# Patient Record
Sex: Female | Born: 1955 | Race: White | Hispanic: No | Marital: Married | Smoking: Never smoker
Health system: Southern US, Community
[De-identification: ages and names within clinical notes are randomized; demographics above are authoritative.]

## PROBLEM LIST (undated history)

## (undated) DIAGNOSIS — Z8679 Personal history of other diseases of the circulatory system: Secondary | ICD-10-CM

## (undated) DIAGNOSIS — F419 Anxiety disorder, unspecified: Secondary | ICD-10-CM

## (undated) DIAGNOSIS — J45909 Unspecified asthma, uncomplicated: Secondary | ICD-10-CM

## (undated) DIAGNOSIS — I10 Essential (primary) hypertension: Secondary | ICD-10-CM

## (undated) DIAGNOSIS — D62 Acute posthemorrhagic anemia: Secondary | ICD-10-CM

## (undated) DIAGNOSIS — E785 Hyperlipidemia, unspecified: Secondary | ICD-10-CM

## (undated) DIAGNOSIS — E079 Disorder of thyroid, unspecified: Secondary | ICD-10-CM

## (undated) DIAGNOSIS — M722 Plantar fascial fibromatosis: Secondary | ICD-10-CM

## (undated) DIAGNOSIS — K5903 Drug induced constipation: Secondary | ICD-10-CM

## (undated) DIAGNOSIS — K219 Gastro-esophageal reflux disease without esophagitis: Secondary | ICD-10-CM

## (undated) DIAGNOSIS — G8918 Other acute postprocedural pain: Secondary | ICD-10-CM

## (undated) HISTORY — PX: NOSE SURGERY: SHX723

## (undated) HISTORY — DX: Unspecified asthma, uncomplicated: J45.909

## (undated) HISTORY — DX: Hyperlipidemia, unspecified: E78.5

## (undated) HISTORY — DX: Acute posthemorrhagic anemia: D62

## (undated) HISTORY — DX: Other acute postprocedural pain: G89.18

## (undated) HISTORY — DX: Disorder of thyroid, unspecified: E07.9

## (undated) HISTORY — DX: Anxiety disorder, unspecified: F41.9

## (undated) HISTORY — DX: Essential (primary) hypertension: I10

## (undated) HISTORY — PX: BREAST SURGERY: SHX581

## (undated) HISTORY — PX: ABDOMINAL HYSTERECTOMY: SHX81

## (undated) HISTORY — DX: Plantar fascial fibromatosis: M72.2

## (undated) HISTORY — DX: Personal history of other diseases of the circulatory system: Z86.79

## (undated) HISTORY — DX: Drug induced constipation: K59.03

---

## 2015-02-08 DIAGNOSIS — R635 Abnormal weight gain: Secondary | ICD-10-CM

## 2015-02-08 DIAGNOSIS — F4322 Adjustment disorder with anxiety: Secondary | ICD-10-CM | POA: Insufficient documentation

## 2015-02-08 HISTORY — DX: Abnormal weight gain: R63.5

## 2015-02-08 HISTORY — DX: Adjustment disorder with anxiety: F43.22

## 2015-03-15 ENCOUNTER — Other Ambulatory Visit: Payer: Self-pay | Admitting: Obstetrics and Gynecology

## 2015-05-24 ENCOUNTER — Ambulatory Visit: Payer: Self-pay | Admitting: Internal Medicine

## 2015-05-24 ENCOUNTER — Telehealth: Payer: Self-pay | Admitting: General Practice

## 2015-05-24 NOTE — Telephone Encounter (Signed)
Please advise if this refill will be appropriate.     KP

## 2015-05-24 NOTE — Telephone Encounter (Signed)
Relation to QM:VHQI Call back number: [phone] Pharmacy: [facility] [address] [place], [place] [address] [phone]    Reason for call:  Patient has a new patient appointment for 07/03/15 and she is completely out of cholesterol medication. Patient is requesting LIVALO  1x daily. Patient is transferring from Dr. Luretha Rued  [address], [place], [place] [address]  Phone: [phone]

## 2015-05-25 ENCOUNTER — Telehealth: Payer: Self-pay

## 2015-05-25 NOTE — Telephone Encounter (Signed)
Called patient advised she will not be able to have refill on prescription until seen by provider. Patient became upset. Request to speak with Dr. Laury Axon or Supervisor. Message forwarded to both.

## 2015-05-25 NOTE — Telephone Encounter (Signed)
Spoke with patient and put her in to establish care with Dr Laury Axon 9.30.2016 at 3pm

## 2015-05-25 NOTE — Telephone Encounter (Signed)
Ive never seen her.  We can not rx meds when we have not seen the patient.    [place] will not be available until tomorrow most likely

## 2015-05-25 NOTE — Telephone Encounter (Signed)
Handled under separate encounter.  

## 2015-05-25 NOTE — Telephone Encounter (Signed)
We can not until we see her---- her previous pcp should be able to do it for her

## 2015-05-26 ENCOUNTER — Encounter: Payer: Self-pay | Admitting: Family Medicine

## 2015-05-26 ENCOUNTER — Ambulatory Visit (INDEPENDENT_AMBULATORY_CARE_PROVIDER_SITE_OTHER): Payer: Managed Care, Other (non HMO) | Admitting: Family Medicine

## 2015-05-26 VITALS — BP 112/70 | HR 76 | Temp 98.9°F | Resp 18 | Ht 67.5 in | Wt 178.2 lb

## 2015-05-26 DIAGNOSIS — E039 Hypothyroidism, unspecified: Secondary | ICD-10-CM | POA: Diagnosis not present

## 2015-05-26 DIAGNOSIS — F411 Generalized anxiety disorder: Secondary | ICD-10-CM | POA: Diagnosis not present

## 2015-05-26 DIAGNOSIS — I10 Essential (primary) hypertension: Secondary | ICD-10-CM

## 2015-05-26 DIAGNOSIS — R300 Dysuria: Secondary | ICD-10-CM | POA: Diagnosis not present

## 2015-05-26 DIAGNOSIS — E559 Vitamin D deficiency, unspecified: Secondary | ICD-10-CM

## 2015-05-26 DIAGNOSIS — E785 Hyperlipidemia, unspecified: Secondary | ICD-10-CM

## 2015-05-26 DIAGNOSIS — Z23 Encounter for immunization: Secondary | ICD-10-CM | POA: Diagnosis not present

## 2015-05-26 DIAGNOSIS — Z1159 Encounter for screening for other viral diseases: Secondary | ICD-10-CM

## 2015-05-26 LAB — POCT URINALYSIS DIPSTICK
Bilirubin, UA: NEGATIVE
Glucose, UA: NEGATIVE
KETONES UA: POSITIVE
Leukocytes, UA: NEGATIVE
Nitrite, UA: NEGATIVE
RBC UA: POSITIVE
UROBILINOGEN UA: 1
pH, UA: 5.5

## 2015-05-26 MED ORDER — ALPRAZOLAM 0.25 MG PO TABS: 0.2500 mg | ORAL_TABLET | Freq: Every evening | ORAL | Status: DC | PRN

## 2015-05-26 MED ORDER — PITAVASTATIN CALCIUM 2 MG PO TABS: ORAL_TABLET | ORAL | Status: DC

## 2015-05-26 NOTE — Progress Notes (Signed)
Pre visit review using our clinic review tool, if applicable. No additional management support is needed unless otherwise documented below in the visit note. 

## 2015-05-26 NOTE — Progress Notes (Signed)
Patient ID: Kathryn Boyer, female   DOB: December 14, 1955, 59 y.o.   MRN: [id number]   Subjective:    Patient ID: Kathryn Boyer, female    DOB: February 06, 1956, 59 y.o.   MRN: [id number]  Chief Complaint  Patient presents with  . Establish Care    Not fasting, needs refills    HPI Patient is in today to establish.  She needs refills of her meds.     Past Medical History  Diagnosis Date  . Thyroid disease   . Hyperlipidemia   . Hypertension   . Anxiety   . Plantar fasciitis     Past Surgical History  Procedure Laterality Date  . Abdominal hysterectomy    . Nose surgery    . Breast surgery      Family History  Problem Relation Age of Onset  . Heart disease Mother   . Stroke Mother   . Hypertension Mother   . Cancer Mother     ovarian  . Aneurysm Father   . Hypertension Father   . Heart disease Father   . Kidney disease Father   . Heart disease Sister     Social History   Social History  . Marital Status: Married    Spouse Name: N/A  . Number of Children: N/A  . Years of Education: N/A   Occupational History  . Not on file.   Social History Main Topics  . Smoking status: Never Smoker   . Smokeless tobacco: Not on file  . Alcohol Use: No  . Drug Use: No  . Sexual Activity: Not on file   Other Topics Concern  . Not on file   Social History Narrative  . No narrative on file    No outpatient prescriptions prior to visit.   No facility-administered medications prior to visit.    Allergies  Allergen Reactions  . Penicillins     ROS     Objective:    Physical Exam  BP 112/70 mmHg  Pulse 76  Temp(Src) 98.9 F (37.2 C) (Oral)  Resp 18  Ht 5' 7.5" (1.715 m)  Wt 178 lb 3.2 oz (80.831 kg)  BMI 27.48 kg/m2  SpO2 98% Wt Readings from Last 3 Encounters:  05/26/15 178 lb 3.2 oz (80.831 kg)     No results found for: WBC, HGB, HCT, PLT, GLUCOSE, CHOL, TRIG, HDL, LDLDIRECT, LDLCALC, ALT, AST, NA, K, CL, CREATININE, BUN, CO2, TSH, PSA, INR, GLUF,  HGBA1C, MICROALBUR  No results found for: TSH No results found for: WBC, HGB, HCT, MCV, PLT No results found for: NA, K, CHLORIDE, CO2, GLUCOSE, BUN, CREATININE, BILITOT, ALKPHOS, AST, ALT, PROT, ALBUMIN, CALCIUM, ANIONGAP, EGFR, GFR No results found for: CHOL No results found for: HDL No results found for: LDLCALC No results found for: TRIG No results found for: CHOLHDL No results found for: HGBA1C     Assessment & Plan:   Problem List Items Addressed This Visit    None    Visit Diagnoses    Hyperlipidemia LDL goal <100    -  Primary    Relevant Medications    losartan (COZAAR) 100 MG tablet    Pitavastatin Calcium (LIVALO) 2 MG TABS    Generalized anxiety disorder        Relevant Medications    ALPRAZolam (XANAX) 0.25 MG tablet       I have changed Kathryn Boyer's ALPRAZolam and Pitavastatin Calcium. I am also having her maintain her levothyroxine, losartan, Vitamin D (Ergocalciferol), and venlafaxine.  Meds ordered this encounter  Medications  . DISCONTD: Pitavastatin Calcium (LIVALO) 2 MG TABS    Sig: Take by mouth.  . levothyroxine (SYNTHROID, LEVOTHROID) 100 MCG tablet    Sig: Take 100 mcg by mouth daily before breakfast.  . losartan (COZAAR) 100 MG tablet    Sig: Take 100 mg by mouth daily.  . Vitamin D, Ergocalciferol, (DRISDOL) 50000 UNITS CAPS capsule    Sig: Take 50,000 Units by mouth every 7 (seven) days.  Marland Kitchen venlafaxine (EFFEXOR) 25 MG tablet    Sig: Take 25 mg by mouth 2 (two) times daily.  Marland Kitchen DISCONTD: ALPRAZolam (XANAX) 0.25 MG tablet    Sig: Take 0.25 mg by mouth at bedtime as needed for anxiety.  . ALPRAZolam (XANAX) 0.25 MG tablet    Sig: Take 1 tablet (0.25 mg total) by mouth at bedtime as needed for anxiety.    Dispense:  30 tablet    Refill:  0  . Pitavastatin Calcium (LIVALO) 2 MG TABS    Sig: 1 po qhs    Dispense:  30 tablet    Refill:  2     Elizabeth Sauer, LPN

## 2015-05-26 NOTE — Patient Instructions (Signed)
Generalized Anxiety Disorder Generalized anxiety disorder (GAD) is a mental disorder. It interferes with life functions, including relationships, work, and school. GAD is different from normal anxiety, which everyone experiences at some point in their lives in response to specific life events and activities. Normal anxiety actually helps us prepare for and get through these life events and activities. Normal anxiety goes away after the event or activity is over.  GAD causes anxiety that is not necessarily related to specific events or activities. It also causes excess anxiety in proportion to specific events or activities. The anxiety associated with GAD is also difficult to control. GAD can vary from mild to severe. People with severe GAD can have intense waves of anxiety with physical symptoms (panic attacks).  SYMPTOMS The anxiety and worry associated with GAD are difficult to control. This anxiety and worry are related to many life events and activities and also occur more days than not for 6 months or longer. People with GAD also have three or more of the following symptoms (one or more in children):  Restlessness.   Fatigue.  Difficulty concentrating.   Irritability.  Muscle tension.  Difficulty sleeping or unsatisfying sleep. DIAGNOSIS GAD is diagnosed through an assessment by your health care provider. Your health care provider will ask you questions aboutyour mood,physical symptoms, and events in your life. Your health care provider may ask you about your medical history and use of alcohol or drugs, including prescription medicines. Your health care provider may also do a physical exam and blood tests. Certain medical conditions and the use of certain substances can cause symptoms similar to those associated with GAD. Your health care provider may refer you to a mental health specialist for further evaluation. TREATMENT The following therapies are usually used to treat GAD:    Medication. Antidepressant medication usually is prescribed for long-term daily control. Antianxiety medicines may be added in severe cases, especially when panic attacks occur.   Talk therapy (psychotherapy). Certain types of talk therapy can be helpful in treating GAD by providing support, education, and guidance. A form of talk therapy called cognitive behavioral therapy can teach you healthy ways to think about and react to daily life events and activities.  Stress managementtechniques. These include yoga, meditation, and exercise and can be very helpful when they are practiced regularly. A mental health specialist can help determine which treatment is best for you. Some people see improvement with one therapy. However, other people require a combination of therapies. Document Released: 12/07/2012 Document Revised: 12/27/2013 Document Reviewed: 12/07/2012 ExitCare Patient Information 2015 [employer]. This information is not intended to replace advice given to you by your health care provider. Make sure you discuss any questions you have with your health care provider.  

## 2015-05-28 LAB — URINE CULTURE

## 2015-05-29 ENCOUNTER — Other Ambulatory Visit (INDEPENDENT_AMBULATORY_CARE_PROVIDER_SITE_OTHER): Payer: Managed Care, Other (non HMO)

## 2015-05-29 DIAGNOSIS — E785 Hyperlipidemia, unspecified: Secondary | ICD-10-CM

## 2015-05-29 DIAGNOSIS — I10 Essential (primary) hypertension: Secondary | ICD-10-CM

## 2015-05-29 DIAGNOSIS — E039 Hypothyroidism, unspecified: Secondary | ICD-10-CM

## 2015-05-29 DIAGNOSIS — Z1159 Encounter for screening for other viral diseases: Secondary | ICD-10-CM

## 2015-05-29 LAB — LIPID PANEL
Cholesterol: 149 mg/dL (ref 0–200)
HDL: 53.1 mg/dL (ref >39.00)
LDL Cholesterol: 77 mg/dL (ref 0–99)
NONHDL: 96.3
Total CHOL/HDL Ratio: 3
Triglycerides: 95 mg/dL (ref 0.0–149.0)
VLDL: 19 mg/dL (ref 0.0–40.0)

## 2015-05-29 LAB — CBC WITH DIFFERENTIAL/PLATELET
BASOS ABS: 0 10*3/uL (ref 0.0–0.1)
Basophils Relative: 0.5 % (ref 0.0–3.0)
EOS ABS: 0.1 10*3/uL (ref 0.0–0.7)
EOS PCT: 2.1 % (ref 0.0–5.0)
HCT: 45.8 % (ref 36.0–46.0)
HEMOGLOBIN: 15.2 g/dL — AB (ref 12.0–15.0)
Lymphocytes Relative: 35.3 % (ref 12.0–46.0)
Lymphs Abs: 2.3 10*3/uL (ref 0.7–4.0)
MCHC: 33.3 g/dL (ref 30.0–36.0)
MCV: 91.1 fl (ref 78.0–100.0)
MONO ABS: 0.4 10*3/uL (ref 0.1–1.0)
Monocytes Relative: 6.2 % (ref 3.0–12.0)
Neutro Abs: 3.6 10*3/uL (ref 1.4–7.7)
Neutrophils Relative %: 55.9 % (ref 43.0–77.0)
Platelets: 280 10*3/uL (ref 150.0–400.0)
RBC: 5.03 Mil/uL (ref 3.87–5.11)
RDW: 13.5 % (ref 11.5–15.5)
WBC: 6.5 10*3/uL (ref 4.0–10.5)

## 2015-05-29 LAB — COMPREHENSIVE METABOLIC PANEL
ALBUMIN: 4.3 g/dL (ref 3.5–5.2)
ALK PHOS: 75 U/L (ref 39–117)
ALT: 21 U/L (ref 0–35)
AST: 23 U/L (ref 0–37)
BUN: 13 mg/dL (ref 6–23)
CALCIUM: 9.3 mg/dL (ref 8.4–10.5)
CO2: 27 mEq/L (ref 19–32)
CREATININE: 0.89 mg/dL (ref 0.40–1.20)
Chloride: 106 mEq/L (ref 96–112)
GFR: 68.74 mL/min (ref >60.00)
Glucose, Bld: 106 mg/dL — ABNORMAL HIGH (ref 70–99)
POTASSIUM: 4.3 meq/L (ref 3.5–5.1)
SODIUM: 143 meq/L (ref 135–145)
TOTAL PROTEIN: 7.1 g/dL (ref 6.0–8.3)
Total Bilirubin: 0.5 mg/dL (ref 0.2–1.2)

## 2015-05-29 LAB — TSH: TSH: 1.61 u[IU]/mL (ref 0.35–4.50)

## 2015-05-29 LAB — VITAMIN D 25 HYDROXY (VIT D DEFICIENCY, FRACTURES): VITD: 44.02 ng/mL (ref 30.00–100.00)

## 2015-05-29 NOTE — Addendum Note (Signed)
Addended by: Verdie Shire on: 05/29/2015 07:06 AM   Modules accepted: Orders

## 2015-05-30 LAB — HEPATITIS C ANTIBODY: HCV Ab: NEGATIVE

## 2015-05-31 ENCOUNTER — Telehealth: Payer: Self-pay | Admitting: Emergency Medicine

## 2015-05-31 LAB — NMR LIPOPROFILE WITHOUT LIPIDS
HDL PARTICLE NUMBER: 35.8 umol/L (ref >=30.5)
HDL SIZE: 8.9 nm — AB (ref >=9.2)
LARGE HDL: 4.9 umol/L (ref >=4.8)
LDL PARTICLE NUMBER: 1223 nmol/L — AB (ref <1000)
LDL SIZE: 21.1 nm (ref >=20.8)
LP-IR SCORE: 56 — AB (ref <=45)
Large VLDL-P: 4 nmol/L — ABNORMAL HIGH (ref <=2.7)
Small LDL Particle Number: 574 nmol/L — ABNORMAL HIGH (ref <=527)
VLDL SIZE: 47.6 nm — AB (ref <=46.6)

## 2015-05-31 NOTE — Telephone Encounter (Signed)
[facility] lab called stating that they are no longer doing NMR lipoprofile. They asked if you wanted to do a lipid panel .

## 2015-05-31 NOTE — Telephone Encounter (Signed)
Please advise      KP 

## 2015-06-01 NOTE — Telephone Encounter (Signed)
Called pt to inform her about note below, she will look at [facility]. KMP.

## 2015-06-01 NOTE — Telephone Encounter (Signed)
We already have a lipid---- let pt know about nmr

## 2015-06-05 ENCOUNTER — Other Ambulatory Visit: Payer: Self-pay

## 2015-06-05 DIAGNOSIS — E785 Hyperlipidemia, unspecified: Secondary | ICD-10-CM

## 2015-06-05 MED ORDER — PITAVASTATIN CALCIUM 4 MG PO TABS: 1.0000 | ORAL_TABLET | Freq: Every day | ORAL | Status: DC

## 2015-06-05 MED ORDER — PITAVASTATIN CALCIUM 2 MG PO TABS: ORAL_TABLET | ORAL | Status: DC

## 2015-06-23 ENCOUNTER — Other Ambulatory Visit: Payer: Self-pay

## 2015-06-23 MED ORDER — VITAMIN D (ERGOCALCIFEROL) 1.25 MG (50000 UNIT) PO CAPS: 50000.0000 [IU] | ORAL_CAPSULE | ORAL | Status: DC

## 2015-06-23 MED ORDER — LEVOTHYROXINE SODIUM 100 MCG PO TABS: 100.0000 ug | ORAL_TABLET | Freq: Every day | ORAL | Status: DC

## 2015-06-23 MED ORDER — LOSARTAN POTASSIUM 100 MG PO TABS: 100.0000 mg | ORAL_TABLET | Freq: Every day | ORAL | Status: DC

## 2015-06-27 ENCOUNTER — Telehealth: Payer: Self-pay | Admitting: Family Medicine

## 2015-06-27 NOTE — Telephone Encounter (Signed)
Caller name: Mieko   Relationship to patient: Self  Can be reached:[phone]  Pharmacy: [facility] - [place], [place] - [address]  Reason for call: pt says that she was tested for a UTI , tested negative. She says that she is sure that she have a UTI and would like a Rx for one. She says that she has had them before and she knows what having one feels like. Offered to schedule pt an, pt declined.

## 2015-06-27 NOTE — Telephone Encounter (Signed)
Spoke with patient earlier today and I made her aware we needed to check the urine before we can send in a prescription, she said she would come in today before 7, she said she had people working on her house at the time.     KP

## 2015-06-27 NOTE — Telephone Encounter (Signed)
Patient wants to know comes today will will get the results today? She is in a lot of pain and needing something for it today

## 2015-06-27 NOTE — Telephone Encounter (Signed)
she needs to come in and do a urinalysis on the lab schedule.     KP

## 2015-06-27 NOTE — Telephone Encounter (Signed)
To MD to review and advise      KP 

## 2015-06-27 NOTE — Telephone Encounter (Signed)
Who tested her urine-- we did it back in sept---  We need at least a urine in lab---- I'll send in rx for symptoms but we need urine documented

## 2015-06-27 NOTE — Telephone Encounter (Signed)
VM left to call the office     KP 

## 2015-06-28 ENCOUNTER — Other Ambulatory Visit: Payer: Self-pay | Admitting: Emergency Medicine

## 2015-06-28 ENCOUNTER — Other Ambulatory Visit (INDEPENDENT_AMBULATORY_CARE_PROVIDER_SITE_OTHER): Payer: Managed Care, Other (non HMO)

## 2015-06-28 DIAGNOSIS — R300 Dysuria: Secondary | ICD-10-CM

## 2015-06-28 LAB — POCT URINALYSIS DIPSTICK
GLUCOSE UA: NEGATIVE
Ketones, UA: NEGATIVE
Nitrite, UA: POSITIVE
Protein, UA: NEGATIVE
SPEC GRAV UA: 1.02
Urobilinogen, UA: 4
pH, UA: 6

## 2015-06-28 MED ORDER — NITROFURANTOIN MONOHYD MACRO 100 MG PO CAPS: 100.0000 mg | ORAL_CAPSULE | Freq: Two times a day (BID) | ORAL | Status: DC

## 2015-06-28 NOTE — Progress Notes (Signed)
Patient came in to complete  a UA. +UA and a Urine culture has been sent. Per Dr.Tabori Macrobid 100 mg BID x's 7 days. Rx faxed to [facility] per patient request.       KP

## 2015-06-30 LAB — URINE CULTURE: Colony Count: >=100000

## 2015-07-02 ENCOUNTER — Encounter: Payer: Self-pay | Admitting: Family Medicine

## 2015-07-03 ENCOUNTER — Other Ambulatory Visit: Payer: Self-pay | Admitting: Family Medicine

## 2015-07-03 ENCOUNTER — Ambulatory Visit: Payer: Self-pay | Admitting: Family Medicine

## 2015-07-03 DIAGNOSIS — N390 Urinary tract infection, site not specified: Secondary | ICD-10-CM

## 2015-07-03 MED ORDER — CIPROFLOXACIN HCL 250 MG PO TABS: 250.0000 mg | ORAL_TABLET | Freq: Two times a day (BID) | ORAL | Status: DC

## 2015-07-05 ENCOUNTER — Telehealth: Payer: Self-pay

## 2015-07-05 NOTE — Telephone Encounter (Signed)
Received Wellness form from Pt's husband, Raiford NobleRick, at his new Pt appt with Dr. Drue NovelPaz. Form completed and stamped with Dr. Ernst SpellLowne's signature. [facility] and copy sent for scanning.

## 2015-07-17 ENCOUNTER — Encounter: Payer: Self-pay | Admitting: Family Medicine

## 2015-07-17 NOTE — Telephone Encounter (Signed)
She needs ov and then it can be scheduled

## 2015-07-25 ENCOUNTER — Ambulatory Visit (INDEPENDENT_AMBULATORY_CARE_PROVIDER_SITE_OTHER): Payer: Managed Care, Other (non HMO) | Admitting: Medical

## 2015-07-25 ENCOUNTER — Encounter: Payer: Self-pay | Admitting: Medical

## 2015-07-25 ENCOUNTER — Ambulatory Visit (HOSPITAL_BASED_OUTPATIENT_CLINIC_OR_DEPARTMENT_OTHER)
Admission: RE | Admit: 2015-07-25 | Discharge: 2015-07-25 | Disposition: A | Discharge status: Home / Self Care | Payer: Managed Care, Other (non HMO) | Source: Ambulatory Visit | Attending: Medical | Admitting: Medical

## 2015-07-25 VITALS — BP 122/82 | HR 76 | Temp 98.1°F | Ht 67.5 in | Wt 177.0 lb

## 2015-07-25 DIAGNOSIS — E785 Hyperlipidemia, unspecified: Secondary | ICD-10-CM | POA: Diagnosis not present

## 2015-07-25 DIAGNOSIS — R319 Hematuria, unspecified: Secondary | ICD-10-CM | POA: Insufficient documentation

## 2015-07-25 DIAGNOSIS — M549 Dorsalgia, unspecified: Secondary | ICD-10-CM | POA: Insufficient documentation

## 2015-07-25 DIAGNOSIS — K5730 Diverticulosis of large intestine without perforation or abscess without bleeding: Secondary | ICD-10-CM | POA: Diagnosis not present

## 2015-07-25 DIAGNOSIS — R1032 Left lower quadrant pain: Secondary | ICD-10-CM | POA: Diagnosis not present

## 2015-07-25 DIAGNOSIS — I10 Essential (primary) hypertension: Secondary | ICD-10-CM | POA: Insufficient documentation

## 2015-07-25 DIAGNOSIS — R109 Unspecified abdominal pain: Secondary | ICD-10-CM | POA: Insufficient documentation

## 2015-07-25 LAB — CBC WITH DIFFERENTIAL/PLATELET
BASOS ABS: 0 10*3/uL (ref 0.0–0.1)
Basophils Relative: 0.7 % (ref 0.0–3.0)
Eosinophils Absolute: 0.1 10*3/uL (ref 0.0–0.7)
Eosinophils Relative: 1.3 % (ref 0.0–5.0)
HCT: 45.5 % (ref 36.0–46.0)
Hemoglobin: 15.2 g/dL — ABNORMAL HIGH (ref 12.0–15.0)
LYMPHS ABS: 2.2 10*3/uL (ref 0.7–4.0)
Lymphocytes Relative: 36.3 % (ref 12.0–46.0)
MCHC: 33.4 g/dL (ref 30.0–36.0)
MCV: 90.8 fl (ref 78.0–100.0)
MONO ABS: 0.5 10*3/uL (ref 0.1–1.0)
Monocytes Relative: 7.8 % (ref 3.0–12.0)
NEUTROS PCT: 53.9 % (ref 43.0–77.0)
Neutro Abs: 3.3 10*3/uL (ref 1.4–7.7)
Platelets: 264 10*3/uL (ref 150.0–400.0)
RBC: 5.02 Mil/uL (ref 3.87–5.11)
RDW: 12.7 % (ref 11.5–15.5)
WBC: 6.1 10*3/uL (ref 4.0–10.5)

## 2015-07-25 LAB — COMPREHENSIVE METABOLIC PANEL
ALT: 21 U/L (ref 0–35)
AST: 20 U/L (ref 0–37)
Albumin: 4.4 g/dL (ref 3.5–5.2)
Alkaline Phosphatase: 86 U/L (ref 39–117)
BUN: 11 mg/dL (ref 6–23)
CHLORIDE: 106 meq/L (ref 96–112)
CO2: 29 meq/L (ref 19–32)
CREATININE: 0.91 mg/dL (ref 0.40–1.20)
Calcium: 9.9 mg/dL (ref 8.4–10.5)
GFR: 66.96 mL/min (ref >60.00)
Glucose, Bld: 98 mg/dL (ref 70–99)
Potassium: 5.1 mEq/L (ref 3.5–5.1)
SODIUM: 141 meq/L (ref 135–145)
TOTAL PROTEIN: 7.1 g/dL (ref 6.0–8.3)
Total Bilirubin: 0.6 mg/dL (ref 0.2–1.2)

## 2015-07-25 LAB — POCT URINALYSIS DIPSTICK
BILIRUBIN UA: NEGATIVE
GLUCOSE UA: NEGATIVE
KETONES UA: NEGATIVE
Leukocytes, UA: NEGATIVE
Nitrite, UA: NEGATIVE
Protein, UA: NEGATIVE
Urobilinogen, UA: 0.2
pH, UA: 6

## 2015-07-25 LAB — LIPASE: LIPASE: 43 U/L (ref 11.0–59.0)

## 2015-07-25 LAB — AMYLASE: Amylase: 50 U/L (ref 27–131)

## 2015-07-25 MED ORDER — RANITIDINE HCL 150 MG PO CAPS: 150.0000 mg | ORAL_CAPSULE | Freq: Two times a day (BID) | ORAL | Status: DC

## 2015-07-25 MED ORDER — METRONIDAZOLE 500 MG PO TABS: 500.0000 mg | ORAL_TABLET | Freq: Three times a day (TID) | ORAL | Status: DC

## 2015-07-25 MED ORDER — CIPROFLOXACIN HCL 500 MG PO TABS: 500.0000 mg | ORAL_TABLET | Freq: Two times a day (BID) | ORAL | Status: DC

## 2015-07-25 NOTE — Patient Instructions (Addendum)
With your recent left side back pain and abdomen pain I do want to get stat cbc, cmp, amylase and lipase.  Also with recent abdomen pain, back pain and blood in your urine will go ahead and order CT abdomen and pelvis.   With your blood in urine in September and some blood in your urine today would recommend urology referral for possible cystoscopy.   For mild to moderate pain can use alleve. If pain increases notify us and could rx narcotic.  Follow up in 3 days or as needed.(asked to schedule for 8 am on Friday)  Pt states some tums over the weekend. Will  rx ranitidine.  Based on lower quadrant pain both right and lower as well of ct report will rx cipro and flagyl for 4 days. Will follow up on Friday 8 am. Be seen sooner if needed. ED or here.

## 2015-07-25 NOTE — Telephone Encounter (Signed)
I advised pt on her ct abdomen/pelvis results and her lab results. When I called around lunch she was stating not in pain. We had discussion and I advised I definitely want her to follow up on Friday am as planned or sooner if needed. She had no ruq type pain.But I did advise if here intermittent pain location changes we could get US ruq on Friday am if indicate(would consider complete abdomen US as well depending on how she is). During the interim I asked pt to called and update us if symptoms change.   Also advised in light of her recent mld lower quadrant pain I do want her to go ahead and start the cipro and flagyl.

## 2015-07-25 NOTE — Progress Notes (Signed)
Pre visit review using our clinic review tool, if applicable. No additional management support is needed unless otherwise documented below in the visit note. 

## 2015-07-25 NOTE — Progress Notes (Signed)
Subjective:    Patient ID: Kathryn Boyer, female    DOB: 1956/06/08, 59 y.o.   MRN: [id number]  HPI  Pt in for some pain in her abdomen and back. Pain was originally left cva area about 2-3 wks ago. Pain in left side back coming and going.(But still present but not as severe). Pt does report some pain in lower quadrants over last week. More on right lower  side then left at times(no rt upper quadrant pain described). Pt has hx of 2 tubal pregnancy. C-section and abdominoplasty. Pt thoughy she had appendectomy but ct reports appendix present. Also hysterctomy(ovaries removed as well at time  Uterus removed).   Hx of some reflux but no obvious symptoms presently..  No diarrhea. Last bm was this am and normal. Not reporting any recent constipation  Pt had a colonoscopy just recently. 3 wks ago with [facility] did study. Some polyps. But no reported diverticulosis per pt. I don't see report in our chart. But CT scan today showed diverticulosis but no diverticulitis  Pt wants some studies done before she goes out of town. Pt states she has high threshold of pain. Does not appear to be in severe pain.   Review of Systems  Constitutional: Negative for chills and fatigue.  Respiratory: Negative for cough, choking and wheezing.   Cardiovascular: Negative for chest pain and palpitations.  Gastrointestinal: Positive for nausea and abdominal pain.       Rare nausea.  Genitourinary: Negative for dysuria, frequency, flank pain and difficulty urinating.  Musculoskeletal: Positive for back pain.       Earl on left cva pain and some lower abdomen region pain.  Skin: Negative for rash.  Hematological: Negative for adenopathy. Does not bruise/bleed easily.    Past Medical History  Diagnosis Date  . Thyroid disease   . Hyperlipidemia   . Hypertension   . Anxiety   . Plantar fasciitis     Social History   Social History  . Marital Status: Married    Spouse Name: N/A  . Number of Children:  N/A  . Years of Education: N/A   Occupational History  . Not on file.   Social History Main Topics  . Smoking status: Never Smoker   . Smokeless tobacco: Not on file  . Alcohol Use: No  . Drug Use: No  . Sexual Activity: Not on file   Other Topics Concern  . Not on file   Social History Narrative    Past Surgical History  Procedure Laterality Date  . Abdominal hysterectomy    . Nose surgery    . Breast surgery      Family History  Problem Relation Age of Onset  . Heart disease Mother   . Stroke Mother   . Hypertension Mother   . Cancer Mother     ovarian  . Aneurysm Father   . Hypertension Father   . Heart disease Father   . Kidney disease Father   . Heart disease Sister     Allergies  Allergen Reactions  . Penicillins     Current Outpatient Prescriptions on File Prior to Visit  Medication Sig Dispense Refill  . ALPRAZolam (XANAX) 0.25 MG tablet Take 1 tablet (0.25 mg total) by mouth at bedtime as needed for anxiety. 30 tablet 0  . levothyroxine (SYNTHROID, LEVOTHROID) 100 MCG tablet Take 1 tablet (100 mcg total) by mouth daily before breakfast. 30 tablet 5  . losartan (COZAAR) 100 MG tablet Take 1 tablet (100  mg total) by mouth daily. 30 tablet 5  . Pitavastatin Calcium (LIVALO) 2 MG TABS 1 po qhs 30 tablet 2  . venlafaxine (EFFEXOR) 25 MG tablet Take 25 mg by mouth 1 day or 1 dose.     . Vitamin D, Ergocalciferol, (DRISDOL) 50000 UNITS CAPS capsule Take 1 capsule (50,000 Units total) by mouth every 7 (seven) days. 4 capsule 2   No current facility-administered medications on file prior to visit.    BP 122/82 mmHg  Pulse 76  Temp(Src) 98.1 F (36.7 C) (Oral)  Ht 5' 7.5" (1.715 m)  Wt 177 lb (80.287 kg)  BMI 27.30 kg/m2  SpO2 98%       Objective:   Physical Exam   General Appearance- Not in acute distress. Pleasant pt. Not appearing to be in severe pain.  HEENT Eyes- Scleraeral/Conjuntiva-bilat- Not Yellow. Mouth & Throat- Normal.  Chest  and Lung Exam Auscultation: Breath sounds:-Normal. Adventitious sounds:- No Adventitious sounds.  Cardiovascular Auscultation:Rythm - Regular. Heart Sounds -Normal heart sounds.  Abdomen Inspection:-Inspection Normal.  Palpation/Perucssion: Palpation and Percussion of the abdomen reveal- very faint  Tenderness rt and lt lower quadrant(more on left side on exam). No masses felt. No obvious hernia felt, No Rebound tenderness, No rigidity(Guarding) and No Palpable abdominal masses.  Liver:-Normal.  Spleen:- Normal.  No rt upper quadrant pain. On inspection of abdomen I don't see any obvious asymetry.  Back- no obvious cva tenderness on exam(but some reported over past 2-3 wks)  .     Assessment & Plan:  With your recent left side back pain and abdomen pain I do want to get stat cbc, cmp, amylase and lipase.  Also with recent abdomen pain, back pain and blood in your urine will go ahead and order CT abdomen and pelvis.   With your blood in urine in September and some blood in your urine today would recommend urology referral for possible cystoscopy. Will try to refer back to one you went to before.   For mild to moderate pain can use alleve. If pain increases notify us and could rx narcotic.(But hesitant to do so now). Considered rx tramadol( but interaction with effexor)  Follow up in 3 days or as needed.  Pt states some tums over the weekend(MILD REFLUX). Will  rx ranitidine  Will follow closely. Would consider US of abdomen if pain location changes. Any significant or severe change in pain or symptoms after hours then ED evaluation recommended

## 2015-07-27 LAB — URINE CULTURE: Colony Count: 3000

## 2015-07-28 ENCOUNTER — Ambulatory Visit (HOSPITAL_BASED_OUTPATIENT_CLINIC_OR_DEPARTMENT_OTHER)
Admission: RE | Admit: 2015-07-28 | Discharge: 2015-07-28 | Disposition: A | Discharge status: Home / Self Care | Payer: Managed Care, Other (non HMO) | Source: Ambulatory Visit | Attending: Medical | Admitting: Medical

## 2015-07-28 ENCOUNTER — Encounter: Payer: Self-pay | Admitting: Medical

## 2015-07-28 ENCOUNTER — Telehealth: Payer: Self-pay | Admitting: Family Medicine

## 2015-07-28 ENCOUNTER — Ambulatory Visit (INDEPENDENT_AMBULATORY_CARE_PROVIDER_SITE_OTHER): Payer: Managed Care, Other (non HMO) | Admitting: Medical

## 2015-07-28 VITALS — BP 100/70 | HR 67 | Temp 98.0°F | Ht 67.5 in | Wt 176.8 lb

## 2015-07-28 DIAGNOSIS — R110 Nausea: Secondary | ICD-10-CM | POA: Insufficient documentation

## 2015-07-28 DIAGNOSIS — K760 Fatty (change of) liver, not elsewhere classified: Secondary | ICD-10-CM | POA: Insufficient documentation

## 2015-07-28 DIAGNOSIS — R195 Other fecal abnormalities: Secondary | ICD-10-CM

## 2015-07-28 DIAGNOSIS — K219 Gastro-esophageal reflux disease without esophagitis: Secondary | ICD-10-CM | POA: Diagnosis not present

## 2015-07-28 DIAGNOSIS — R1084 Generalized abdominal pain: Secondary | ICD-10-CM

## 2015-07-28 DIAGNOSIS — R1031 Right lower quadrant pain: Secondary | ICD-10-CM

## 2015-07-28 LAB — CBC WITH DIFFERENTIAL/PLATELET
BASOS PCT: 0.5 % (ref 0.0–3.0)
Basophils Absolute: 0 10*3/uL (ref 0.0–0.1)
EOS ABS: 0.1 10*3/uL (ref 0.0–0.7)
Eosinophils Relative: 1.5 % (ref 0.0–5.0)
HCT: 45.5 % (ref 36.0–46.0)
HEMOGLOBIN: 15.3 g/dL — AB (ref 12.0–15.0)
LYMPHS ABS: 2.1 10*3/uL (ref 0.7–4.0)
Lymphocytes Relative: 35.7 % (ref 12.0–46.0)
MCHC: 33.5 g/dL (ref 30.0–36.0)
MCV: 90.9 fl (ref 78.0–100.0)
MONO ABS: 0.5 10*3/uL (ref 0.1–1.0)
Monocytes Relative: 8.3 % (ref 3.0–12.0)
NEUTROS PCT: 54 % (ref 43.0–77.0)
Neutro Abs: 3.2 10*3/uL (ref 1.4–7.7)
Platelets: 250 10*3/uL (ref 150.0–400.0)
RBC: 5.01 Mil/uL (ref 3.87–5.11)
RDW: 12.6 % (ref 11.5–15.5)
WBC: 5.9 10*3/uL (ref 4.0–10.5)

## 2015-07-28 LAB — COMPREHENSIVE METABOLIC PANEL
ALBUMIN: 4.4 g/dL (ref 3.5–5.2)
ALK PHOS: 86 U/L (ref 39–117)
ALT: 18 U/L (ref 0–35)
AST: 21 U/L (ref 0–37)
BILIRUBIN TOTAL: 0.8 mg/dL (ref 0.2–1.2)
BUN: 11 mg/dL (ref 6–23)
CHLORIDE: 108 meq/L (ref 96–112)
CO2: 28 meq/L (ref 19–32)
Calcium: 9.7 mg/dL (ref 8.4–10.5)
Creatinine, Ser: 0.9 mg/dL (ref 0.40–1.20)
GFR: 67.82 mL/min (ref >60.00)
GLUCOSE: 104 mg/dL — AB (ref 70–99)
POTASSIUM: 5 meq/L (ref 3.5–5.1)
SODIUM: 142 meq/L (ref 135–145)
Total Protein: 7.4 g/dL (ref 6.0–8.3)

## 2015-07-28 MED ORDER — CIPROFLOXACIN HCL 500 MG PO TABS: 500.0000 mg | ORAL_TABLET | Freq: Two times a day (BID) | ORAL | Status: DC

## 2015-07-28 MED ORDER — METRONIDAZOLE 500 MG PO TABS: 500.0000 mg | ORAL_TABLET | Freq: Three times a day (TID) | ORAL | Status: DC

## 2015-07-28 NOTE — Telephone Encounter (Signed)
Pt called in to see if her ultra sound results are back in.    Please call back to advise further.   CB [phone]

## 2015-07-28 NOTE — Telephone Encounter (Signed)
I called pt to discuss her results since I was not sure that she got my chart message.

## 2015-07-28 NOTE — Progress Notes (Signed)
Pre visit review using our clinic review tool, if applicable. No additional management support is needed unless otherwise documented below in the visit note. 

## 2015-07-28 NOTE — Progress Notes (Signed)
Subjective:    Patient ID: Kathryn Boyer, female    DOB: November 16, 1955, 59 y.o.   MRN: [id number]  HPI  Pt in with some loose stools couple of times today. She has been on cipro and flagyl for bilateral lower quadrant pain. CT of abdomen was negative. Pt had only faint mild dull pain.Low level 1-2 level pain. No nausea or vomiting since I last saw her. Pt ct of abdomen and pelvis did find anything definitive. Labs were negative.  Pt yesterday she had some faint ruq pain after eating a chicken breast. But also all diffuse mild pain.   Review of Systems  Constitutional: Negative for fever, chills and fatigue.  Respiratory: Negative for cough, shortness of breath and wheezing.   Cardiovascular: Negative for chest pain and palpitations.  Gastrointestinal: Positive for abdominal pain and diarrhea. Negative for nausea, constipation, blood in stool and abdominal distention.       Mild loose stools at times.  Genitourinary: Negative for dysuria, hematuria and flank pain.  Musculoskeletal: Negative for back pain.  Skin: Negative for rash.  Neurological: Negative for dizziness, speech difficulty, weakness, numbness and headaches.  Hematological: Negative for adenopathy. Does not bruise/bleed easily.  Psychiatric/Behavioral: Negative for behavioral problems and decreased concentration.    Past Medical History  Diagnosis Date  . Thyroid disease   . Hyperlipidemia   . Hypertension   . Anxiety   . Plantar fasciitis     Social History   Social History  . Marital Status: Married    Spouse Name: N/A  . Number of Children: N/A  . Years of Education: N/A   Occupational History  . Not on file.   Social History Main Topics  . Smoking status: Never Smoker   . Smokeless tobacco: Not on file  . Alcohol Use: No  . Drug Use: No  . Sexual Activity: Not on file   Other Topics Concern  . Not on file   Social History Narrative    Past Surgical History  Procedure Laterality Date  .  Abdominal hysterectomy    . Nose surgery    . Breast surgery      Family History  Problem Relation Age of Onset  . Heart disease Mother   . Stroke Mother   . Hypertension Mother   . Cancer Mother     ovarian  . Aneurysm Father   . Hypertension Father   . Heart disease Father   . Kidney disease Father   . Heart disease Sister     Allergies  Allergen Reactions  . Penicillins     Current Outpatient Prescriptions on File Prior to Visit  Medication Sig Dispense Refill  . ALPRAZolam (XANAX) 0.25 MG tablet Take 1 tablet (0.25 mg total) by mouth at bedtime as needed for anxiety. 30 tablet 0  . ciprofloxacin (CIPRO) 500 MG tablet Take 1 tablet (500 mg total) by mouth 2 (two) times daily. 8 tablet 0  . levothyroxine (SYNTHROID, LEVOTHROID) 100 MCG tablet Take 1 tablet (100 mcg total) by mouth daily before breakfast. 30 tablet 5  . losartan (COZAAR) 100 MG tablet Take 1 tablet (100 mg total) by mouth daily. 30 tablet 5  . metroNIDAZOLE (FLAGYL) 500 MG tablet Take 1 tablet (500 mg total) by mouth 3 (three) times daily. 12 tablet 0  . Pitavastatin Calcium (LIVALO) 2 MG TABS 1 po qhs 30 tablet 2  . ranitidine (ZANTAC) 150 MG capsule Take 1 capsule (150 mg total) by mouth 2 (two) times daily.  60 capsule 0  . venlafaxine (EFFEXOR) 25 MG tablet Take 25 mg by mouth 1 day or 1 dose.     . Vitamin D, Ergocalciferol, (DRISDOL) 50000 UNITS CAPS capsule Take 1 capsule (50,000 Units total) by mouth every 7 (seven) days. 4 capsule 2   No current facility-administered medications on file prior to visit.    BP 100/70 mmHg  Pulse 67  Temp(Src) 98 F (36.7 C) (Oral)  Ht 5' 7.5" (1.715 m)  Wt 176 lb 12.8 oz (80.196 kg)  BMI 27.27 kg/m2  SpO2 98%       Objective:   Physical Exam  General Appearance- Not in acute distress.  HEENT Eyes- Scleraeral/Conjuntiva-bilat- Not Yellow. Mouth & Throat- Normal.  Chest and Lung Exam Auscultation: Breath sounds:-Normal. Adventitious sounds:- No  Adventitious sounds.  Cardiovascular Auscultation:Rythm - Regular. Heart Sounds -Normal heart sounds.  Abdomen Inspection:-Inspection Normal.  Palpation/Perucssion: Palpation and Percussion of the abdomen reveal- very faint rlq and llq  Tenderness(slight more llq), No Rebound tenderness, No rigidity(Guarding) and No Palpable abdominal masses.  Liver:-Normal.  Spleen:- Normal.   NO heel jar pain. No pain on rotation rt lower ext.  Back- no cva tenderness.      Assessment & Plan:  Your pain persists in the lower quadrants. I think it is best to continue the flagyl and cipro for at least another 3 days in light of the fact of llq pain and hx of diverticulosis on the CT.   Will get cbc and cmp today stat. Will also get abd us since you are concerned for gallbladder etiology and had some recent ruq pain after eating.  I will go ahead and try to refer you to your GI doctor that did colonoscopy. Get there opinion.  I also will try to get you appointment with general surgeon in event your rt lower quadrant faint pain persist. Even though ct was negative(for appendix inflammation) we can rule that out entirely. If gastro can't find definitive cause. Then might need to consider clinical diagnosis appendicitis.May repeat ct in that event. Any change of severe pain then ED evaluation.  Follow up with us mid week. Will try to get you in with specialist early next week

## 2015-07-28 NOTE — Patient Instructions (Signed)
Your pain persists in the lower quadrants. I think it is best to continue the flagyl and cipro for at least another 3 days in light of the fact of llq pain and hx of diverticulosis on the CT.   Will get cbc and cmp today stat. Will also get abd us since you are concerned for gallbladder etiology and had some recent ruq pain after eating.  I will go ahead and try to refer you to your GI doctor that did colonoscopy. Get there opinion.  I also will try to get you appointment with general surgeon in event your rt lower quadrant faint pain persist. Even though ct was negative(for appendix inflammation) we can rule that out entirely. If gastro can't find definitive cause. Then might need to consider clinical diagnosis appendicitis.May repeat ct in that event. Any change of severe pain then ED evaluation.  Follow up with us mid week. Will try to get you in with specialist early next week

## 2015-07-31 NOTE — Telephone Encounter (Signed)
Spoke with pt and she voices understanding.  

## 2015-08-01 ENCOUNTER — Encounter: Payer: Self-pay | Admitting: Family Medicine

## 2015-08-02 NOTE — Telephone Encounter (Signed)
Will you call pt and see how she is. Still working on referrals. But wanted to now how she is. Level of pain. Location of pain if any. Also I had wanted to advise her since she has been on antibiotic to get probiotic and use over next month. She can get those otc.

## 2015-08-08 NOTE — Telephone Encounter (Signed)
Jennnifer,  What is status of GI referral? I saw you work on [facility] referral thanks.

## 2015-08-22 DIAGNOSIS — M810 Age-related osteoporosis without current pathological fracture: Secondary | ICD-10-CM | POA: Insufficient documentation

## 2015-08-22 HISTORY — DX: Age-related osteoporosis without current pathological fracture: M81.0

## 2015-09-08 ENCOUNTER — Encounter: Payer: Self-pay | Admitting: Family Medicine

## 2015-09-08 ENCOUNTER — Ambulatory Visit (INDEPENDENT_AMBULATORY_CARE_PROVIDER_SITE_OTHER): Payer: Managed Care, Other (non HMO) | Admitting: Family Medicine

## 2015-09-08 VITALS — BP 112/70 | HR 61 | Temp 98.3°F | Ht 68.0 in | Wt 177.0 lb

## 2015-09-08 DIAGNOSIS — Z23 Encounter for immunization: Secondary | ICD-10-CM | POA: Diagnosis not present

## 2015-09-08 DIAGNOSIS — E785 Hyperlipidemia, unspecified: Secondary | ICD-10-CM

## 2015-09-08 DIAGNOSIS — Z0000 Encounter for general adult medical examination without abnormal findings: Secondary | ICD-10-CM | POA: Diagnosis not present

## 2015-09-08 DIAGNOSIS — E039 Hypothyroidism, unspecified: Secondary | ICD-10-CM

## 2015-09-08 LAB — COMPREHENSIVE METABOLIC PANEL
ALBUMIN: 4.4 g/dL (ref 3.5–5.2)
ALT: 24 U/L (ref 0–35)
AST: 24 U/L (ref 0–37)
Alkaline Phosphatase: 84 U/L (ref 39–117)
BILIRUBIN TOTAL: 0.7 mg/dL (ref 0.2–1.2)
BUN: 12 mg/dL (ref 6–23)
CALCIUM: 9.5 mg/dL (ref 8.4–10.5)
CHLORIDE: 104 meq/L (ref 96–112)
CO2: 28 mEq/L (ref 19–32)
CREATININE: 0.89 mg/dL (ref 0.40–1.20)
GFR: 68.67 mL/min (ref >60.00)
Glucose, Bld: 87 mg/dL (ref 70–99)
Potassium: 4.3 mEq/L (ref 3.5–5.1)
Sodium: 140 mEq/L (ref 135–145)
Total Protein: 7.4 g/dL (ref 6.0–8.3)

## 2015-09-08 LAB — HEPATIC FUNCTION PANEL
ALBUMIN: 4.4 g/dL (ref 3.5–5.2)
ALT: 24 U/L (ref 0–35)
AST: 24 U/L (ref 0–37)
Alkaline Phosphatase: 84 U/L (ref 39–117)
BILIRUBIN TOTAL: 0.7 mg/dL (ref 0.2–1.2)
Bilirubin, Direct: 0.1 mg/dL (ref 0.0–0.3)
Total Protein: 7.4 g/dL (ref 6.0–8.3)

## 2015-09-08 LAB — CBC WITH DIFFERENTIAL/PLATELET
Basophils Absolute: 0 10*3/uL (ref 0.0–0.1)
Basophils Relative: 0.4 % (ref 0.0–3.0)
EOS ABS: 0.1 10*3/uL (ref 0.0–0.7)
Eosinophils Relative: 1.4 % (ref 0.0–5.0)
HCT: 45.7 % (ref 36.0–46.0)
Hemoglobin: 15.4 g/dL — ABNORMAL HIGH (ref 12.0–15.0)
LYMPHS PCT: 37.4 % (ref 12.0–46.0)
Lymphs Abs: 2.8 10*3/uL (ref 0.7–4.0)
MCHC: 33.8 g/dL (ref 30.0–36.0)
MCV: 89.7 fl (ref 78.0–100.0)
Monocytes Absolute: 0.5 10*3/uL (ref 0.1–1.0)
Monocytes Relative: 7.3 % (ref 3.0–12.0)
NEUTROS PCT: 53.5 % (ref 43.0–77.0)
Neutro Abs: 4 10*3/uL (ref 1.4–7.7)
PLATELETS: 249 10*3/uL (ref 150.0–400.0)
RBC: 5.09 Mil/uL (ref 3.87–5.11)
RDW: 12.8 % (ref 11.5–15.5)
WBC: 7.4 10*3/uL (ref 4.0–10.5)

## 2015-09-08 LAB — POCT URINALYSIS DIPSTICK
BILIRUBIN UA: NEGATIVE
GLUCOSE UA: NEGATIVE
KETONES UA: NEGATIVE
Leukocytes, UA: NEGATIVE
Nitrite, UA: NEGATIVE
Protein, UA: NEGATIVE
RBC UA: NEGATIVE
SPEC GRAV UA: 1.025
UROBILINOGEN UA: NEGATIVE
pH, UA: 6

## 2015-09-08 LAB — LIPID PANEL
CHOL/HDL RATIO: 4
CHOLESTEROL: 184 mg/dL (ref 0–200)
HDL: 50.8 mg/dL (ref >39.00)
LDL CALC: 115 mg/dL — AB (ref 0–99)
NonHDL: 132.71
TRIGLYCERIDES: 90 mg/dL (ref 0.0–149.0)
VLDL: 18 mg/dL (ref 0.0–40.0)

## 2015-09-08 LAB — TSH: TSH: 1 u[IU]/mL (ref 0.35–4.50)

## 2015-09-08 MED ORDER — LIRAGLUTIDE -WEIGHT MANAGEMENT 18 MG/3ML ~~LOC~~ SOPN: 3.0000 mg | PEN_INJECTOR | Freq: Every day | SUBCUTANEOUS | Status: DC

## 2015-09-08 NOTE — Patient Instructions (Signed)
Preventive Care for Adults, Female A healthy lifestyle and preventive care can promote health and wellness. Preventive health guidelines for women include the following key practices.  A routine yearly physical is a good way to check with your health care provider about your health and preventive screening. It is a chance to share any concerns and updates on your health and to receive a thorough exam.  Visit your dentist for a routine exam and preventive care every 6 months. Brush your teeth twice a day and floss once a day. Good oral hygiene prevents tooth decay and gum disease.  The frequency of eye exams is based on your age, health, family medical history, use of contact lenses, and other factors. Follow your health care provider's recommendations for frequency of eye exams.  Eat a healthy diet. Foods like vegetables, fruits, whole grains, low-fat dairy products, and lean protein foods contain the nutrients you need without too many calories. Decrease your intake of foods high in solid fats, added sugars, and salt. Eat the right amount of calories for you.Get information about a proper diet from your health care provider, if necessary.  Regular physical exercise is one of the most important things you can do for your health. Most adults should get at least 150 minutes of moderate-intensity exercise (any activity that increases your heart rate and causes you to sweat) each week. In addition, most adults need muscle-strengthening exercises on 2 or more days a week.  Maintain a healthy weight. The body mass index (BMI) is a screening tool to identify possible weight problems. It provides an estimate of body fat based on height and weight. Your health care provider can find your BMI and can help you achieve or maintain a healthy weight.For adults 20 years and older:  A BMI below 18.5 is considered underweight.  A BMI of 18.5 to 24.9 is normal.  A BMI of 25 to 29.9 is considered overweight.  A  BMI of 30 and above is considered obese.  Maintain normal blood lipids and cholesterol levels by exercising and minimizing your intake of saturated fat. Eat a balanced diet with plenty of fruit and vegetables. Blood tests for lipids and cholesterol should begin at age 45 and be repeated every 5 years. If your lipid or cholesterol levels are high, you are over 50, or you are at high risk for heart disease, you may need your cholesterol levels checked more frequently.Ongoing high lipid and cholesterol levels should be treated with medicines if diet and exercise are not working.  If you smoke, find out from your health care provider how to quit. If you do not use tobacco, do not start.  Lung cancer screening is recommended for adults aged 45-80 years who are at high risk for developing lung cancer because of a history of smoking. A yearly low-dose CT scan of the lungs is recommended for people who have at least a 30-pack-year history of smoking and are a current smoker or have quit within the past 15 years. A pack year of smoking is smoking an average of 1 pack of cigarettes a day for 1 year (for example: 1 pack a day for 30 years or 2 packs a day for 15 years). Yearly screening should continue until the smoker has stopped smoking for at least 15 years. Yearly screening should be stopped for people who develop a health problem that would prevent them from having lung cancer treatment.  If you are pregnant, do not drink alcohol. If you are  breastfeeding, be very cautious about drinking alcohol. If you are not pregnant and choose to drink alcohol, do not have more than 1 drink per day. One drink is considered to be 12 ounces (355 mL) of beer, 5 ounces (148 mL) of wine, or 1.5 ounces (44 mL) of liquor.  Avoid use of street drugs. Do not share needles with anyone. Ask for help if you need support or instructions about stopping the use of drugs.  High blood pressure causes heart disease and increases the risk  of stroke. Your blood pressure should be checked at least every 1 to 2 years. Ongoing high blood pressure should be treated with medicines if weight loss and exercise do not work.  If you are 55-79 years old, ask your health care provider if you should take aspirin to prevent strokes.  Diabetes screening is done by taking a blood sample to check your blood glucose level after you have not eaten for a certain period of time (fasting). If you are not overweight and you do not have risk factors for diabetes, you should be screened once every 3 years starting at age 45. If you are overweight or obese and you are 40-70 years of age, you should be screened for diabetes every year as part of your cardiovascular risk assessment.  Breast cancer screening is essential preventive care for women. You should practice "breast self-awareness." This means understanding the normal appearance and feel of your breasts and may include breast self-examination. Any changes detected, no matter how small, should be reported to a health care provider. Women in their 20s and 30s should have a clinical breast exam (CBE) by a health care provider as part of a regular health exam every 1 to 3 years. After age 40, women should have a CBE every year. Starting at age 40, women should consider having a mammogram (breast X-ray test) every year. Women who have a family history of breast cancer should talk to their health care provider about genetic screening. Women at a high risk of breast cancer should talk to their health care providers about having an MRI and a mammogram every year.  Breast cancer gene (BRCA)-related cancer risk assessment is recommended for women who have family members with BRCA-related cancers. BRCA-related cancers include breast, ovarian, tubal, and peritoneal cancers. Having family members with these cancers may be associated with an increased risk for harmful changes (mutations) in the breast cancer genes BRCA1 and  BRCA2. Results of the assessment will determine the need for genetic counseling and BRCA1 and BRCA2 testing.  Your health care provider may recommend that you be screened regularly for cancer of the pelvic organs (ovaries, uterus, and vagina). This screening involves a pelvic examination, including checking for microscopic changes to the surface of your cervix (Pap test). You may be encouraged to have this screening done every 3 years, beginning at age 21.  For women ages 30-65, health care providers may recommend pelvic exams and Pap testing every 3 years, or they may recommend the Pap and pelvic exam, combined with testing for human papilloma virus (HPV), every 5 years. Some types of HPV increase your risk of cervical cancer. Testing for HPV may also be done on women of any age with unclear Pap test results.  Other health care providers may not recommend any screening for nonpregnant women who are considered low risk for pelvic cancer and who do not have symptoms. Ask your health care provider if a screening pelvic exam is right for   you.  If you have had past treatment for cervical cancer or a condition that could lead to cancer, you need Pap tests and screening for cancer for at least 20 years after your treatment. If Pap tests have been discontinued, your risk factors (such as having a new sexual partner) need to be reassessed to determine if screening should resume. Some women have medical problems that increase the chance of getting cervical cancer. In these cases, your health care provider may recommend more frequent screening and Pap tests.  Colorectal cancer can be detected and often prevented. Most routine colorectal cancer screening begins at the age of 50 years and continues through age 75 years. However, your health care provider may recommend screening at an earlier age if you have risk factors for colon cancer. On a yearly basis, your health care provider may provide home test kits to check  for hidden blood in the stool. Use of a small camera at the end of a tube, to directly examine the colon (sigmoidoscopy or colonoscopy), can detect the earliest forms of colorectal cancer. Talk to your health care provider about this at age 50, when routine screening begins. Direct exam of the colon should be repeated every 5-10 years through age 75 years, unless early forms of precancerous polyps or small growths are found.  People who are at an increased risk for hepatitis B should be screened for this virus. You are considered at high risk for hepatitis B if:  You were born in a country where hepatitis B occurs often. Talk with your health care provider about which countries are considered high risk.  Your parents were born in a high-risk country and you have not received a shot to protect against hepatitis B (hepatitis B vaccine).  You have HIV or AIDS.  You use needles to inject street drugs.  You live with, or have sex with, someone who has hepatitis B.  You get hemodialysis treatment.  You take certain medicines for conditions like cancer, organ transplantation, and autoimmune conditions.  Hepatitis C blood testing is recommended for all people born from 1945 through 1965 and any individual with known risks for hepatitis C.  Practice safe sex. Use condoms and avoid high-risk sexual practices to reduce the spread of sexually transmitted infections (STIs). STIs include gonorrhea, chlamydia, syphilis, trichomonas, herpes, HPV, and human immunodeficiency virus (HIV). Herpes, HIV, and HPV are viral illnesses that have no cure. They can result in disability, cancer, and death.  You should be screened for sexually transmitted illnesses (STIs) including gonorrhea and chlamydia if:  You are sexually active and are younger than 24 years.  You are older than 24 years and your health care provider tells you that you are at risk for this type of infection.  Your sexual activity has changed  since you were last screened and you are at an increased risk for chlamydia or gonorrhea. Ask your health care provider if you are at risk.  If you are at risk of being infected with HIV, it is recommended that you take a prescription medicine daily to prevent HIV infection. This is called preexposure prophylaxis (PrEP). You are considered at risk if:  You are sexually active and do not regularly use condoms or know the HIV status of your partner(s).  You take drugs by injection.  You are sexually active with a partner who has HIV.  Talk with your health care provider about whether you are at high risk of being infected with HIV. If   you choose to begin PrEP, you should first be tested for HIV. You should then be tested every 3 months for as long as you are taking PrEP.  Osteoporosis is a disease in which the bones lose minerals and strength with aging. This can result in serious bone fractures or breaks. The risk of osteoporosis can be identified using a bone density scan. Women ages 67 years and over and women at risk for fractures or osteoporosis should discuss screening with their health care providers. Ask your health care provider whether you should take a calcium supplement or vitamin D to reduce the rate of osteoporosis.  Menopause can be associated with physical symptoms and risks. Hormone replacement therapy is available to decrease symptoms and risks. You should talk to your health care provider about whether hormone replacement therapy is right for you.  Use sunscreen. Apply sunscreen liberally and repeatedly throughout the day. You should seek shade when your shadow is shorter than you. Protect yourself by wearing long sleeves, pants, a wide-brimmed hat, and sunglasses year round, whenever you are outdoors.  Once a month, do a whole body skin exam, using a mirror to look at the skin on your back. Tell your health care provider of new moles, moles that have irregular borders, moles that  are larger than a pencil eraser, or moles that have changed in shape or color.  Stay current with required vaccines (immunizations).  Influenza vaccine. All adults should be immunized every year.  Tetanus, diphtheria, and acellular pertussis (Td, Tdap) vaccine. Pregnant women should receive 1 dose of Tdap vaccine during each pregnancy. The dose should be obtained regardless of the length of time since the last dose. Immunization is preferred during the 27th-36th week of gestation. An adult who has not previously received Tdap or who does not know her vaccine status should receive 1 dose of Tdap. This initial dose should be followed by tetanus and diphtheria toxoids (Td) booster doses every 10 years. Adults with an unknown or incomplete history of completing a 3-dose immunization series with Td-containing vaccines should begin or complete a primary immunization series including a Tdap dose. Adults should receive a Td booster every 10 years.  Varicella vaccine. An adult without evidence of immunity to varicella should receive 2 doses or a second dose if she has previously received 1 dose. Pregnant females who do not have evidence of immunity should receive the first dose after pregnancy. This first dose should be obtained before leaving the health care facility. The second dose should be obtained 4-8 weeks after the first dose.  Human papillomavirus (HPV) vaccine. Females aged 13-26 years who have not received the vaccine previously should obtain the 3-dose series. The vaccine is not recommended for use in pregnant females. However, pregnancy testing is not needed before receiving a dose. If a female is found to be pregnant after receiving a dose, no treatment is needed. In that case, the remaining doses should be delayed until after the pregnancy. Immunization is recommended for any person with an immunocompromised condition through the age of 61 years if she did not get any or all doses earlier. During the  3-dose series, the second dose should be obtained 4-8 weeks after the first dose. The third dose should be obtained 24 weeks after the first dose and 16 weeks after the second dose.  Zoster vaccine. One dose is recommended for adults aged 30 years or older unless certain conditions are present.  Measles, mumps, and rubella (MMR) vaccine. Adults born  before 1957 generally are considered immune to measles and mumps. Adults born in 1957 or later should have 1 or more doses of MMR vaccine unless there is a contraindication to the vaccine or there is laboratory evidence of immunity to each of the three diseases. A routine second dose of MMR vaccine should be obtained at least 28 days after the first dose for students attending postsecondary schools, health care workers, or international travelers. People who received inactivated measles vaccine or an unknown type of measles vaccine during 1963-1967 should receive 2 doses of MMR vaccine. People who received inactivated mumps vaccine or an unknown type of mumps vaccine before 1979 and are at high risk for mumps infection should consider immunization with 2 doses of MMR vaccine. For females of childbearing age, rubella immunity should be determined. If there is no evidence of immunity, females who are not pregnant should be vaccinated. If there is no evidence of immunity, females who are pregnant should delay immunization until after pregnancy. Unvaccinated health care workers born before 1957 who lack laboratory evidence of measles, mumps, or rubella immunity or laboratory confirmation of disease should consider measles and mumps immunization with 2 doses of MMR vaccine or rubella immunization with 1 dose of MMR vaccine.  Pneumococcal 13-valent conjugate (PCV13) vaccine. When indicated, a person who is uncertain of his immunization history and has no record of immunization should receive the PCV13 vaccine. All adults 65 years of age and older should receive this  vaccine. An adult aged 19 years or older who has certain medical conditions and has not been previously immunized should receive 1 dose of PCV13 vaccine. This PCV13 should be followed with a dose of pneumococcal polysaccharide (PPSV23) vaccine. Adults who are at high risk for pneumococcal disease should obtain the PPSV23 vaccine at least 8 weeks after the dose of PCV13 vaccine. Adults older than 61 years of age who have normal immune system function should obtain the PPSV23 vaccine dose at least 1 year after the dose of PCV13 vaccine.  Pneumococcal polysaccharide (PPSV23) vaccine. When PCV13 is also indicated, PCV13 should be obtained first. All adults aged 65 years and older should be immunized. An adult younger than age 65 years who has certain medical conditions should be immunized. Any person who resides in a nursing home or long-term care facility should be immunized. An adult smoker should be immunized. People with an immunocompromised condition and certain other conditions should receive both PCV13 and PPSV23 vaccines. People with human immunodeficiency virus (HIV) infection should be immunized as soon as possible after diagnosis. Immunization during chemotherapy or radiation therapy should be avoided. Routine use of PPSV23 vaccine is not recommended for American Indians, [place] Natives, or people younger than 65 years unless there are medical conditions that require PPSV23 vaccine. When indicated, people who have unknown immunization and have no record of immunization should receive PPSV23 vaccine. One-time revaccination 5 years after the first dose of PPSV23 is recommended for people aged 19-64 years who have chronic kidney failure, nephrotic syndrome, asplenia, or immunocompromised conditions. People who received 1-2 doses of PPSV23 before age 65 years should receive another dose of PPSV23 vaccine at age 65 years or later if at least 5 years have passed since the previous dose. Doses of PPSV23 are not  needed for people immunized with PPSV23 at or after age 65 years.  Meningococcal vaccine. Adults with asplenia or persistent complement component deficiencies should receive 2 doses of quadrivalent meningococcal conjugate (MenACWY-D) vaccine. The doses should be obtained   at least 2 months apart. Microbiologists working with certain meningococcal bacteria, [place] recruits, people at risk during an outbreak, and people who travel to or live in countries with a high rate of meningitis should be immunized. A first-year college student up through age 34 years who is living in a residence hall should receive a dose if she did not receive a dose on or after her 16th birthday. Adults who have certain high-risk conditions should receive one or more doses of vaccine.  Hepatitis A vaccine. Adults who wish to be protected from this disease, have certain high-risk conditions, work with hepatitis A-infected animals, work in hepatitis A research labs, or travel to or work in countries with a high rate of hepatitis A should be immunized. Adults who were previously unvaccinated and who anticipate close contact with an international adoptee during the first 60 days after arrival in the [place] from a country with a high rate of hepatitis A should be immunized.  Hepatitis B vaccine. Adults who wish to be protected from this disease, have certain high-risk conditions, may be exposed to blood or other infectious body fluids, are household contacts or sex partners of hepatitis B positive people, are clients or workers in certain care facilities, or travel to or work in countries with a high rate of hepatitis B should be immunized.  Haemophilus influenzae type b (Hib) vaccine. A previously unvaccinated person with asplenia or sickle cell disease or having a scheduled splenectomy should receive 1 dose of Hib vaccine. Regardless of previous immunization, a recipient of a hematopoietic stem cell transplant should receive a  3-dose series 6-12 months after her successful transplant. Hib vaccine is not recommended for adults with HIV infection. Preventive Services / Frequency Ages 35 to 4 years  Blood pressure check.** / Every 3-5 years.  Lipid and cholesterol check.** / Every 5 years beginning at age 60.  Clinical breast exam.** / Every 3 years for women in their 71s and 10s.  BRCA-related cancer risk assessment.** / For women who have family members with a BRCA-related cancer (breast, ovarian, tubal, or peritoneal cancers).  Pap test.** / Every 2 years from ages 76 through 26. Every 3 years starting at age 61 through age 76 or 93 with a history of 3 consecutive normal Pap tests.  HPV screening.** / Every 3 years from ages 37 through ages 60 to 51 with a history of 3 consecutive normal Pap tests.  Hepatitis C blood test.** / For any individual with known risks for hepatitis C.  Skin self-exam. / Monthly.  Influenza vaccine. / Every year.  Tetanus, diphtheria, and acellular pertussis (Tdap, Td) vaccine.** / Consult your health care provider. Pregnant women should receive 1 dose of Tdap vaccine during each pregnancy. 1 dose of Td every 10 years.  Varicella vaccine.** / Consult your health care provider. Pregnant females who do not have evidence of immunity should receive the first dose after pregnancy.  HPV vaccine. / 3 doses over 6 months, if 93 and younger. The vaccine is not recommended for use in pregnant females. However, pregnancy testing is not needed before receiving a dose.  Measles, mumps, rubella (MMR) vaccine.** / You need at least 1 dose of MMR if you were born in 1957 or later. You may also need a 2nd dose. For females of childbearing age, rubella immunity should be determined. If there is no evidence of immunity, females who are not pregnant should be vaccinated. If there is no evidence of immunity, females who are  pregnant should delay immunization until after pregnancy.  Pneumococcal  13-valent conjugate (PCV13) vaccine.** / Consult your health care provider.  Pneumococcal polysaccharide (PPSV23) vaccine.** / 1 to 2 doses if you smoke cigarettes or if you have certain conditions.  Meningococcal vaccine.** / 1 dose if you are age 68 to 8 years and a Market researcher living in a residence hall, or have one of several medical conditions, you need to get vaccinated against meningococcal disease. You may also need additional booster doses.  Hepatitis A vaccine.** / Consult your health care provider.  Hepatitis B vaccine.** / Consult your health care provider.  Haemophilus influenzae type b (Hib) vaccine.** / Consult your health care provider. Ages 7 to 53 years  Blood pressure check.** / Every year.  Lipid and cholesterol check.** / Every 5 years beginning at age 25 years.  Lung cancer screening. / Every year if you are aged 11-80 years and have a 30-pack-year history of smoking and currently smoke or have quit within the past 15 years. Yearly screening is stopped once you have quit smoking for at least 15 years or develop a health problem that would prevent you from having lung cancer treatment.  Clinical breast exam.** / Every year after age 48 years.  BRCA-related cancer risk assessment.** / For women who have family members with a BRCA-related cancer (breast, ovarian, tubal, or peritoneal cancers).  Mammogram.** / Every year beginning at age 41 years and continuing for as long as you are in good health. Consult with your health care provider.  Pap test.** / Every 3 years starting at age 65 years through age 37 or 70 years with a history of 3 consecutive normal Pap tests.  HPV screening.** / Every 3 years from ages 72 years through ages 60 to 40 years with a history of 3 consecutive normal Pap tests.  Fecal occult blood test (FOBT) of stool. / Every year beginning at age 21 years and continuing until age 5 years. You may not need to do this test if you get  a colonoscopy every 10 years.  Flexible sigmoidoscopy or colonoscopy.** / Every 5 years for a flexible sigmoidoscopy or every 10 years for a colonoscopy beginning at age 35 years and continuing until age 48 years.  Hepatitis C blood test.** / For all people born from [birth year] through 1965 and any individual with known risks for hepatitis C.  Skin self-exam. / Monthly.  Influenza vaccine. / Every year.  Tetanus, diphtheria, and acellular pertussis (Tdap/Td) vaccine.** / Consult your health care provider. Pregnant women should receive 1 dose of Tdap vaccine during each pregnancy. 1 dose of Td every 10 years.  Varicella vaccine.** / Consult your health care provider. Pregnant females who do not have evidence of immunity should receive the first dose after pregnancy.  Zoster vaccine.** / 1 dose for adults aged 30 years or older.  Measles, mumps, rubella (MMR) vaccine.** / You need at least 1 dose of MMR if you were born in 1957 or later. You may also need a second dose. For females of childbearing age, rubella immunity should be determined. If there is no evidence of immunity, females who are not pregnant should be vaccinated. If there is no evidence of immunity, females who are pregnant should delay immunization until after pregnancy.  Pneumococcal 13-valent conjugate (PCV13) vaccine.** / Consult your health care provider.  Pneumococcal polysaccharide (PPSV23) vaccine.** / 1 to 2 doses if you smoke cigarettes or if you have certain conditions.  Meningococcal vaccine.** /  Consult your health care provider.  Hepatitis A vaccine.** / Consult your health care provider.  Hepatitis B vaccine.** / Consult your health care provider.  Haemophilus influenzae type b (Hib) vaccine.** / Consult your health care provider. Ages 64 years and over  Blood pressure check.** / Every year.  Lipid and cholesterol check.** / Every 5 years beginning at age 23 years.  Lung cancer screening. / Every year if you  are aged 16-80 years and have a 30-pack-year history of smoking and currently smoke or have quit within the past 15 years. Yearly screening is stopped once you have quit smoking for at least 15 years or develop a health problem that would prevent you from having lung cancer treatment.  Clinical breast exam.** / Every year after age 74 years.  BRCA-related cancer risk assessment.** / For women who have family members with a BRCA-related cancer (breast, ovarian, tubal, or peritoneal cancers).  Mammogram.** / Every year beginning at age 44 years and continuing for as long as you are in good health. Consult with your health care provider.  Pap test.** / Every 3 years starting at age 58 years through age 22 or 39 years with 3 consecutive normal Pap tests. Testing can be stopped between 65 and 70 years with 3 consecutive normal Pap tests and no abnormal Pap or HPV tests in the past 10 years.  HPV screening.** / Every 3 years from ages 64 years through ages 70 or 61 years with a history of 3 consecutive normal Pap tests. Testing can be stopped between 65 and 70 years with 3 consecutive normal Pap tests and no abnormal Pap or HPV tests in the past 10 years.  Fecal occult blood test (FOBT) of stool. / Every year beginning at age 40 years and continuing until age 27 years. You may not need to do this test if you get a colonoscopy every 10 years.  Flexible sigmoidoscopy or colonoscopy.** / Every 5 years for a flexible sigmoidoscopy or every 10 years for a colonoscopy beginning at age 7 years and continuing until age 32 years.  Hepatitis C blood test.** / For all people born from [birth year] through 1966 and any individual with known risks for hepatitis C.  Osteoporosis screening.** / A one-time screening for women ages 30 years and over and women at risk for fractures or osteoporosis.  Skin self-exam. / Monthly.  Influenza vaccine. / Every year.  Tetanus, diphtheria, and acellular pertussis (Tdap/Td)  vaccine.** / 1 dose of Td every 10 years.  Varicella vaccine.** / Consult your health care provider.  Zoster vaccine.** / 1 dose for adults aged 35 years or older.  Pneumococcal 13-valent conjugate (PCV13) vaccine.** / Consult your health care provider.  Pneumococcal polysaccharide (PPSV23) vaccine.** / 1 dose for all adults aged 46 years and older.  Meningococcal vaccine.** / Consult your health care provider.  Hepatitis A vaccine.** / Consult your health care provider.  Hepatitis B vaccine.** / Consult your health care provider.  Haemophilus influenzae type b (Hib) vaccine.** / Consult your health care provider. ** Family history and personal history of risk and conditions may change your health care provider's recommendations.   This information is not intended to replace advice given to you by your health care provider. Make sure you discuss any questions you have with your health care provider.   Document Released: 10/08/2001 Document Revised: 09/02/2014 Document Reviewed: 01/07/2011 Elsevier Interactive Patient Education [employer].

## 2015-09-08 NOTE — Progress Notes (Signed)
Subjective:     Kathryn Boyer is a 60 y.o. female and is here for a comprehensive physical exam. The patient reports no problems.  Social History   Social History  . Marital Status: Married    Spouse Name: N/A  . Number of Children: N/A  . Years of Education: N/A   Occupational History  . Not on file.   Social History Main Topics  . Smoking status: Never Smoker   . Smokeless tobacco: Not on file  . Alcohol Use: No  . Drug Use: No  . Sexual Activity: Not on file   Other Topics Concern  . Not on file   Social History Narrative   Health Maintenance  Topic Date Due  . HIV Screening  04/14/1970  . TETANUS/TDAP  04/14/1974  . MAMMOGRAM  04/14/2005  . COLONOSCOPY  04/14/2005  . ZOSTAVAX  04/15/2015  . INFLUENZA VACCINE  03/26/2016  . Hepatitis C Screening  Completed    The following portions of the patient's history were reviewed and updated as appropriate:  She  has a past medical history of Thyroid disease; Hyperlipidemia; Hypertension; Anxiety; and Plantar fasciitis. She  does not have any pertinent problems on file. She  has past surgical history that includes Abdominal hysterectomy; Nose surgery; and Breast surgery. Her family history includes Aneurysm in her father; Cancer in her mother; Heart disease in her father, mother, and sister; Hypertension in her father and mother; Kidney disease in her father; Stroke in her mother. She  reports that she has never smoked. She does not have any smokeless tobacco history on file. She reports that she does not drink alcohol or use illicit drugs. She has a current medication list which includes the following prescription(s): alprazolam, levothyroxine, losartan, pitavastatin calcium, venlafaxine, and vitamin d (ergocalciferol). Current Outpatient Prescriptions on File Prior to Visit  Medication Sig Dispense Refill  . ALPRAZolam (XANAX) 0.25 MG tablet Take 1 tablet (0.25 mg total) by mouth at bedtime as needed for anxiety. 30 tablet  0  . levothyroxine (SYNTHROID, LEVOTHROID) 100 MCG tablet Take 1 tablet (100 mcg total) by mouth daily before breakfast. 30 tablet 5  . losartan (COZAAR) 100 MG tablet Take 1 tablet (100 mg total) by mouth daily. 30 tablet 5  . Pitavastatin Calcium (LIVALO) 2 MG TABS 1 po qhs 30 tablet 2  . venlafaxine (EFFEXOR) 25 MG tablet Take 25 mg by mouth 1 day or 1 dose.     . Vitamin D, Ergocalciferol, (DRISDOL) 50000 UNITS CAPS capsule Take 1 capsule (50,000 Units total) by mouth every 7 (seven) days. 4 capsule 2   No current facility-administered medications on file prior to visit.   She is allergic to penicillins..  Review of Systems Review of Systems  Constitutional: Negative for activity change, appetite change and fatigue.  HENT: Negative for hearing loss, congestion, tinnitus and ear discharge.  dentist q25mEyes: Negative for visual disturbance (see optho q1y -- vision corrected to 20/20 with glasses).  Respiratory: Negative for cough, chest tightness and shortness of breath.   Cardiovascular: Negative for chest pain, palpitations and leg swelling.  Gastrointestinal: Negative for abdominal pain, diarrhea, constipation and abdominal distention.  Genitourinary: Negative for urgency, frequency, decreased urine volume and difficulty urinating.  Musculoskeletal: Negative for back pain, arthralgias and gait problem.  Skin: Negative for color change, pallor and rash.  Neurological: Negative for dizziness, light-headedness, numbness and headaches.  Hematological: Negative for adenopathy. Does not bruise/bleed easily.  Psychiatric/Behavioral: Negative for suicidal ideas, confusion, sleep disturbance,  self-injury, dysphoric mood, decreased concentration and agitation.       Objective:    BP 112/70 mmHg  Pulse 61  Temp(Src) 98.3 F (36.8 C) (Oral)  Ht 5' 8" (1.727 m)  Wt 177 lb (80.287 kg)  BMI 26.92 kg/m2  SpO2 98% General appearance: alert, cooperative, appears stated age and no  distress Head: Normocephalic, without obvious abnormality, atraumatic Eyes: conjunctivae/corneas clear. PERRL, EOM's intact. Fundi benign. Ears: normal TM's and external ear canals both ears Nose: Nares normal. Septum midline. Mucosa normal. No drainage or sinus tenderness. Throat: lips, mucosa, and tongue normal; teeth and gums normal Neck: no adenopathy, no carotid bruit, no JVD, supple, symmetrical, trachea midline and thyroid not enlarged, symmetric, no tenderness/mass/nodules Back: symmetric, no curvature. ROM normal. No CVA tenderness. Lungs: clear to auscultation bilaterally Breasts: normal appearance, no masses or tenderness Heart: regular rate and rhythm, S1, S2 normal, no murmur, click, rub or gallop Abdomen: soft, non-tender; bowel sounds normal; no masses,  no organomegaly Pelvic: deferred--gyn Extremities: extremities normal, atraumatic, no cyanosis or edema Pulses: 2+ and symmetric Skin: Skin color, texture, turgor normal. No rashes or lesions Lymph nodes: Cervical, supraclavicular, and axillary nodes normal. Neurologic: Alert and oriented X 3, normal strength and tone. Normal symmetric reflexes. Normal coordination and gait Psych-- no depression, no anxiety      Assessment:    Healthy female exam.       Plan:    ghm utd Check labs See After Visit Summary for Counseling Recommendations    1. Morbid obesity, unspecified obesity type ([place]) D/w pt diet and execise rto 2-3 months - Liraglutide -Weight Management (SAXENDA) 18 MG/3ML SOPN; Inject 3 mg into the skin daily.  Dispense: 3 mL; Refill: 3  2. Preventative health care See above - Comp Met (CMET) - CBC with Differential/Platelet - Hepatic function panel - Lipid panel - POCT urinalysis dipstick - TSH  3. Hypothyroidism, unspecified hypothyroidism type  Current outpatient prescriptions:  .  ALPRAZolam (XANAX) 0.25 MG tablet, Take 1 tablet (0.25 mg total) by mouth at bedtime as needed for anxiety., Disp:  30 tablet, Rfl: 0 .  levothyroxine (SYNTHROID, LEVOTHROID) 100 MCG tablet, Take 1 tablet (100 mcg total) by mouth daily before breakfast., Disp: 30 tablet, Rfl: 5 .  losartan (COZAAR) 100 MG tablet, Take 1 tablet (100 mg total) by mouth daily., Disp: 30 tablet, Rfl: 5 .  Pitavastatin Calcium (LIVALO) 2 MG TABS, 1 po qhs, Disp: 30 tablet, Rfl: 2 .  venlafaxine (EFFEXOR) 25 MG tablet, Take 25 mg by mouth 1 day or 1 dose. , Disp: , Rfl:  .  Vitamin D, Ergocalciferol, (DRISDOL) 50000 UNITS CAPS capsule, Take 1 capsule (50,000 Units total) by mouth every 7 (seven) days., Disp: 4 capsule, Rfl: 2 .  Liraglutide -Weight Management (SAXENDA) 18 MG/3ML SOPN, Inject 3 mg into the skin daily., Disp: 3 mL, Rfl: 3  - Comp Met (CMET) - CBC with Differential/Platelet - Hepatic function panel - Lipid panel - POCT urinalysis dipstick - TSH  4. Hyperlipidemia  Current outpatient prescriptions:  .  ALPRAZolam (XANAX) 0.25 MG tablet, Take 1 tablet (0.25 mg total) by mouth at bedtime as needed for anxiety., Disp: 30 tablet, Rfl: 0 .  levothyroxine (SYNTHROID, LEVOTHROID) 100 MCG tablet, Take 1 tablet (100 mcg total) by mouth daily before breakfast., Disp: 30 tablet, Rfl: 5 .  losartan (COZAAR) 100 MG tablet, Take 1 tablet (100 mg total) by mouth daily., Disp: 30 tablet, Rfl: 5 .  Pitavastatin Calcium (LIVALO) 2  MG TABS, 1 po qhs, Disp: 30 tablet, Rfl: 2 .  venlafaxine (EFFEXOR) 25 MG tablet, Take 25 mg by mouth 1 day or 1 dose. , Disp: , Rfl:  .  Vitamin D, Ergocalciferol, (DRISDOL) 50000 UNITS CAPS capsule, Take 1 capsule (50,000 Units total) by mouth every 7 (seven) days., Disp: 4 capsule, Rfl: 2 .  Liraglutide -Weight Management (SAXENDA) 18 MG/3ML SOPN, Inject 3 mg into the skin daily., Disp: 3 mL, Rfl: 3  - Comp Met (CMET) - CBC with Differential/Platelet - Hepatic function panel - Lipid panel - POCT urinalysis dipstick - TSH  5. Need for diphtheria-tetanus-pertussis (Tdap) vaccine,  adult/adolescent  - Tdap vaccine greater than or equal to 7yo IM

## 2015-09-08 NOTE — Progress Notes (Signed)
Pre visit review using our clinic review tool, if applicable. No additional management support is needed unless otherwise documented below in the visit note. 

## 2015-10-19 ENCOUNTER — Telehealth: Payer: Self-pay

## 2015-10-19 MED ORDER — VENLAFAXINE HCL ER 37.5 MG PO CP24: 37.5000 mg | ORAL_CAPSULE | Freq: Every day | ORAL | Status: DC

## 2015-10-19 NOTE — Telephone Encounter (Signed)
Ok to fill #30  5 refills 

## 2015-10-19 NOTE — Telephone Encounter (Signed)
Rx request for Effexor XR 37.5 mg daily from the pharmacy. Never filled here, last seen 09/08/15, Rx on med list for 25 mg entered Historically   Please advise     KP

## 2015-10-19 NOTE — Telephone Encounter (Signed)
Rx faxed.    KP 

## 2015-10-31 ENCOUNTER — Encounter: Payer: Self-pay | Admitting: Family Medicine

## 2015-10-31 ENCOUNTER — Other Ambulatory Visit: Payer: Self-pay

## 2015-10-31 ENCOUNTER — Telehealth: Payer: Self-pay | Admitting: Family Medicine

## 2015-10-31 MED ORDER — CYCLOBENZAPRINE HCL 10 MG PO TABS: 10.0000 mg | ORAL_TABLET | Freq: Three times a day (TID) | ORAL | Status: DC | PRN

## 2015-10-31 NOTE — Telephone Encounter (Signed)
Last seen 09/08/15. Please advise    KP

## 2015-10-31 NOTE — Telephone Encounter (Signed)
Pt called stating she bent over and hurt her back. She states she barely could make it to the chair. She has taken 3 advil and used aspercreme on her back. She has not had any relief. Pt asking if we can suggest something else as she is in too much pain and not capable of coming in to the office today. She states she has osteoporosis (see records that he was sending over today). Advised pt that an appt would be needed for prescription. She again stated that she is not capable because she is in too much pain. Please call pt at [phone].

## 2015-10-31 NOTE — Telephone Encounter (Signed)
See mychart.  

## 2015-10-31 NOTE — Telephone Encounter (Signed)
Flexeril 10 mg tid #30   Ov tomorrow

## 2015-11-01 ENCOUNTER — Ambulatory Visit (INDEPENDENT_AMBULATORY_CARE_PROVIDER_SITE_OTHER): Payer: Managed Care, Other (non HMO) | Admitting: Family Medicine

## 2015-11-01 ENCOUNTER — Encounter: Payer: Self-pay | Admitting: Family Medicine

## 2015-11-01 VITALS — BP 130/86 | HR 92 | Temp 98.2°F | Resp 16 | Wt 168.1 lb

## 2015-11-01 DIAGNOSIS — M5416 Radiculopathy, lumbar region: Secondary | ICD-10-CM | POA: Diagnosis not present

## 2015-11-01 HISTORY — DX: Radiculopathy, lumbar region: M54.16

## 2015-11-01 MED ORDER — MELOXICAM 15 MG PO TABS: 15.0000 mg | ORAL_TABLET | Freq: Every day | ORAL | Status: DC

## 2015-11-01 NOTE — Patient Instructions (Signed)
Follow up as needed Start the Meloxicam once daily x5-7 days and then as needed- take w/ food Use the flexeril as needed- will cause drowsiness HEAT!!! Gentle stretching to avoid stiffness Avoid ibuprofen, aleve, motrin while on the mobic but tylenol is ok to add for breakthrough pain Call with any questions or concerns Hang in there!!!

## 2015-11-01 NOTE — Assessment & Plan Note (Signed)
New.  Pt's pain is already improving since initial onset yesterday.  Flexeril is providing relief.  Start scheduled NSAID to help w/ inflammation and stay ahead of pain.  Heating pad.  Gentle stretching.  Reviewed supportive care and red flags that should prompt return.  Pt expressed understanding and is in agreement w/ plan.

## 2015-11-01 NOTE — Progress Notes (Signed)
Pre visit review using our clinic review tool, if applicable. No additional management support is needed unless otherwise documented below in the visit note. 

## 2015-11-01 NOTE — Progress Notes (Signed)
   Subjective:    Patient ID: Kathryn Boyer, female    DOB: 03-26-1956, 60 y.o.   MRN: [id number]  HPI Back pain- pt reports she bent over yesterday and 'i literally could not get back up'.  Pt took 3 Advil x2 yesterday and Flexeril last night w/ some relief.  Pt was able to get up and move around w/ assistance of walker.  Pain was bilateral and radiated into buttocks.  No numbness/tingling of feet.  Denies leg weakness.   Review of Systems For ROS see HPI     Objective:   Physical Exam  Constitutional: She is oriented to person, place, and time. She appears well-developed and well-nourished. No distress.  HENT:  Head: Normocephalic and atraumatic.  Cardiovascular: Intact distal pulses.   Musculoskeletal: She exhibits tenderness (TTP over lumbar spine and paraspinal muscles bilaterally). She exhibits no edema.  Neurological: She is alert and oriented to person, place, and time. She has normal reflexes.  (-) SLR bilaterally Ambulating w/ walker  Skin: Skin is warm and dry.  Vitals reviewed.         Assessment & Plan:

## 2015-11-02 ENCOUNTER — Encounter: Payer: Self-pay | Admitting: Family Medicine

## 2015-12-21 ENCOUNTER — Other Ambulatory Visit: Payer: Self-pay

## 2015-12-21 DIAGNOSIS — E785 Hyperlipidemia, unspecified: Secondary | ICD-10-CM

## 2015-12-21 MED ORDER — LOSARTAN POTASSIUM 100 MG PO TABS: 100.0000 mg | ORAL_TABLET | Freq: Every day | ORAL | Status: DC

## 2015-12-21 MED ORDER — PITAVASTATIN CALCIUM 2 MG PO TABS: ORAL_TABLET | ORAL | Status: DC

## 2015-12-21 MED ORDER — LEVOTHYROXINE SODIUM 100 MCG PO TABS: 100.0000 ug | ORAL_TABLET | Freq: Every day | ORAL | Status: DC

## 2016-01-18 ENCOUNTER — Other Ambulatory Visit: Payer: Self-pay

## 2016-01-18 MED ORDER — LOSARTAN POTASSIUM 100 MG PO TABS: 100.0000 mg | ORAL_TABLET | Freq: Every day | ORAL | Status: DC

## 2016-01-18 MED ORDER — ERGOCALCIFEROL 1.25 MG (50000 UT) PO CAPS: 50000.0000 [IU] | ORAL_CAPSULE | ORAL | Status: DC

## 2016-02-01 ENCOUNTER — Encounter: Payer: Self-pay | Admitting: Pediatrics

## 2016-02-01 ENCOUNTER — Ambulatory Visit (INDEPENDENT_AMBULATORY_CARE_PROVIDER_SITE_OTHER): Payer: Managed Care, Other (non HMO) | Admitting: Pediatrics

## 2016-02-01 VITALS — BP 118/80 | HR 77 | Temp 98.2°F | Resp 16 | Ht 67.0 in | Wt 180.0 lb

## 2016-02-01 DIAGNOSIS — J0100 Acute maxillary sinusitis, unspecified: Secondary | ICD-10-CM

## 2016-02-01 DIAGNOSIS — I10 Essential (primary) hypertension: Secondary | ICD-10-CM

## 2016-02-01 DIAGNOSIS — J4521 Mild intermittent asthma with (acute) exacerbation: Secondary | ICD-10-CM

## 2016-02-01 DIAGNOSIS — J45901 Unspecified asthma with (acute) exacerbation: Secondary | ICD-10-CM | POA: Insufficient documentation

## 2016-02-01 DIAGNOSIS — J301 Allergic rhinitis due to pollen: Secondary | ICD-10-CM | POA: Insufficient documentation

## 2016-02-01 HISTORY — DX: Allergic rhinitis due to pollen: J30.1

## 2016-02-01 HISTORY — DX: Acute maxillary sinusitis, unspecified: J01.00

## 2016-02-01 HISTORY — DX: Unspecified asthma with (acute) exacerbation: J45.901

## 2016-02-01 HISTORY — DX: Essential (primary) hypertension: I10

## 2016-02-01 MED ORDER — MONTELUKAST SODIUM 10 MG PO TABS: 10.0000 mg | ORAL_TABLET | Freq: Every day | ORAL | Status: DC

## 2016-02-01 MED ORDER — PROAIR HFA 108 (90 BASE) MCG/ACT IN AERS: 2.0000 | INHALATION_SPRAY | RESPIRATORY_TRACT | Status: DC | PRN

## 2016-02-01 MED ORDER — AMOXICILLIN-POT CLAVULANATE 875-125 MG PO TABS: ORAL_TABLET | ORAL | Status: DC

## 2016-02-01 NOTE — Progress Notes (Signed)
[address] [place] [address] Dept: [phone]  FOLLOW UP NOTE  Patient ID: Kathryn BachelorDiane Boyer, female    DOB: 1955/10/31  Age: 60 y.o. MRN: [id number] Date of Office Visit: 02/01/2016  Assessment Chief Complaint: Other  HPI Kathryn Boyer presents for evaluation of some coughing and wheezing and nasal congestion. She has had pressure in her sinuses for about 3 weeks. She has had a headache. She is bringing up a discolored mucus.from her nose. She is allergic to grass pollen, tree pollens, dust mites. Her last testing was in 1997. Last year she was able to tolerate Augmentin for a sinus infection. She has had exacerbations of asthma and sinus infections in the spring and fall of the year for several years.  Current medications montelukast 10 mg once a day if needed, Pro-air 2 puffs every 4 hours if needed. Flunisolide nasal spray irritates her nose., Losartan 100 mg daily. Her other medications are outlined in the chart   Drug Allergies:  Allergies  Allergen Reactions  . Penicillins Rash    Physical Exam: BP 118/80 mmHg  Pulse 77  Temp(Src) 98.2 F (36.8 C) (Oral)  Resp 16  Ht 5\' 7"  (1.702 m)  Wt 180 lb (81.647 kg)  BMI 28.19 kg/m2   Physical Exam  Constitutional: She is oriented to person, place, and time. She appears well-developed and well-nourished.  HENT:  Eyes normal. Ears normal. Nose moderate swelling of nasa turbinates. Pharynx normal except for a yellow postnasal drainage  Cardiovascular:  S1 and S2 normal no murmurs  Pulmonary/Chest:  Clear to percussion auscultation except for some rhonchi in both lungs  Lymphadenopathy:    She has no cervical adenopathy.  Neurological: She is alert and oriented to person, place, and time.  Psychiatric: She has a normal mood and affect. Her behavior is normal. Judgment and thought content normal.  Vitals reviewed.   Diagnostics:  FVC 2.77 L FEV1 2.32 L. Predicted FVC 3.67 L predicted FEV1 2.84 L. After albuterol by  nebulization FVC 2.88 L FEV1 2.47 L-this shows a mild reduction in the forced vital capacity with mild improvement after albuterol  Assessment and Plan: 1. Asthma with acute exacerbation, mild intermittent   2. Allergic rhinitis due to pollen   3. Acute maxillary sinusitis, recurrence not specified   4. Essential hypertension     Meds ordered this encounter  Medications  . amoxicillin-clavulanate (AUGMENTIN) 875-125 MG tablet    Sig: Take 1 tablet every 12 hours for 10 days for infection    Dispense:  20 tablet    Refill:  0  . montelukast (SINGULAIR) 10 MG tablet    Sig: Take 1 tablet (10 mg total) by mouth daily.    Dispense:  30 tablet    Refill:  5  . PROAIR HFA 108 (90 Base) MCG/ACT inhaler    Sig: Inhale 2 puffs into the lungs every 4 (four) hours as needed for wheezing or shortness of breath.    Dispense:  1 Inhaler    Refill:  3    Patient Instructions  Montelukast  10 mg once a day for coughing or wheezing Pro-air 2 puffs every 4 hours if needed for wheezing or coughing spells Rhinocort 2 sprays per nostril once a day for stuffy nose Add prednisone 20 mg twice a day for 3 days 20 mg on the fourth day 10 mg on the fifth day Add Augmentin 875 mg every 12 hours for 10 days for infection Call me if you are not  doing well on this treatment plan  With her recurrent exacerbations of asthma in the spring and fall of the year, it would be wise to update her allergy testing. Her last allergy testing was 2007    Return in about 6 weeks (around 03/14/2016).    Thank you for the opportunity to care for this patient.  Please do not hesitate to contact me with questions.  Tonette Bihari, M.D.  [facility] [address] [place], [place] [address] [phone]

## 2016-02-01 NOTE — Patient Instructions (Addendum)
Montelukast  10 mg once a day for coughing or wheezing Pro-air 2 puffs every 4 hours if needed for wheezing or coughing spells Rhinocort 2 sprays per nostril once a day for stuffy nose Add prednisone 20 mg twice a day for 3 days 20 mg on the fourth day 10 mg on the fifth day Add Augmentin 875 mg every 12 hours for 10 days for infection Call me if you are not doing well on this treatment plan  With her recurrent exacerbations of asthma in the spring and fall of the year, it would be wise to update her allergy testing. Her last allergy testing was 2007

## 2016-02-06 ENCOUNTER — Ambulatory Visit (INDEPENDENT_AMBULATORY_CARE_PROVIDER_SITE_OTHER): Payer: Managed Care, Other (non HMO) | Admitting: Family Medicine

## 2016-02-06 ENCOUNTER — Encounter: Payer: Self-pay | Admitting: Family Medicine

## 2016-02-06 VITALS — BP 120/70 | HR 64 | Temp 98.7°F | Wt 181.0 lb

## 2016-02-06 DIAGNOSIS — R739 Hyperglycemia, unspecified: Secondary | ICD-10-CM | POA: Diagnosis not present

## 2016-02-06 DIAGNOSIS — N898 Other specified noninflammatory disorders of vagina: Secondary | ICD-10-CM

## 2016-02-06 DIAGNOSIS — R350 Frequency of micturition: Secondary | ICD-10-CM | POA: Diagnosis not present

## 2016-02-06 DIAGNOSIS — N951 Menopausal and female climacteric states: Secondary | ICD-10-CM | POA: Diagnosis not present

## 2016-02-06 DIAGNOSIS — E038 Other specified hypothyroidism: Secondary | ICD-10-CM

## 2016-02-06 DIAGNOSIS — R319 Hematuria, unspecified: Secondary | ICD-10-CM

## 2016-02-06 DIAGNOSIS — R82998 Other abnormal findings in urine: Secondary | ICD-10-CM

## 2016-02-06 HISTORY — DX: Other specified noninflammatory disorders of vagina: N89.8

## 2016-02-06 LAB — POC URINALSYSI DIPSTICK (AUTOMATED)
Bilirubin, UA: NEGATIVE
Glucose, UA: NEGATIVE
Ketones, UA: NEGATIVE
Nitrite, UA: NEGATIVE
Protein, UA: NEGATIVE
Spec Grav, UA: >=1.03
Urobilinogen, UA: 2
pH, UA: 5.5

## 2016-02-06 LAB — COMPREHENSIVE METABOLIC PANEL
ALT: 26 U/L (ref 0–35)
AST: 22 U/L (ref 0–37)
Albumin: 4.4 g/dL (ref 3.5–5.2)
Alkaline Phosphatase: 81 U/L (ref 39–117)
BUN: 17 mg/dL (ref 6–23)
CO2: 26 mEq/L (ref 19–32)
Calcium: 9.4 mg/dL (ref 8.4–10.5)
Chloride: 103 mEq/L (ref 96–112)
Creatinine, Ser: 1.08 mg/dL (ref 0.40–1.20)
GFR: 54.85 mL/min — ABNORMAL LOW (ref >60.00)
Glucose, Bld: 100 mg/dL — ABNORMAL HIGH (ref 70–99)
Potassium: 4.1 mEq/L (ref 3.5–5.1)
Sodium: 139 mEq/L (ref 135–145)
Total Bilirubin: 0.5 mg/dL (ref 0.2–1.2)
Total Protein: 7.3 g/dL (ref 6.0–8.3)

## 2016-02-06 LAB — THYROID PANEL WITH TSH
Free Thyroxine Index: 2.6 (ref 1.4–3.8)
T3 Uptake: 33 % (ref 22–35)
T4, Total: 7.9 ug/dL (ref 4.5–12.0)
TSH: 1.81 mIU/L

## 2016-02-06 MED ORDER — ESTROGENS, CONJUGATED 0.625 MG/GM VA CREA: 1.0000 | TOPICAL_CREAM | Freq: Every day | VAGINAL | Status: DC

## 2016-02-06 MED ORDER — LIRAGLUTIDE -WEIGHT MANAGEMENT 18 MG/3ML ~~LOC~~ SOPN: 3.0000 mg | PEN_INJECTOR | Freq: Every day | SUBCUTANEOUS | Status: DC

## 2016-02-06 NOTE — Progress Notes (Signed)
Pre visit review using our clinic review tool, if applicable. No additional management support is needed unless otherwise documented below in the visit note. 

## 2016-02-06 NOTE — Assessment & Plan Note (Signed)
saxenda daily F/u 3 months Discussed diet and exercise

## 2016-02-06 NOTE — Assessment & Plan Note (Signed)
And urinary frequency Refer to gyn  Premarin cream rto prn

## 2016-02-06 NOTE — Progress Notes (Signed)
   Subjective:    Patient ID: Kathryn Boyer, female    DOB: 11-Aug-1956, 60 y.o.   MRN: [id number]  HPI Pt here to f/u weight.   She stopped the saxenda but would like to go back on it.  She is struggling with sugar cravings . She also wants a referral to gyn to discuss the [place] lisa lazer.      Review of Systems  Constitutional: Negative for diaphoresis, appetite change, fatigue and unexpected weight change.  Eyes: Negative for pain, redness and visual disturbance.  Respiratory: Negative for cough, chest tightness, shortness of breath and wheezing.   Cardiovascular: Negative for chest pain, palpitations and leg swelling.  Endocrine: Negative for cold intolerance, heat intolerance, polydipsia, polyphagia and polyuria.  Genitourinary: Negative for dysuria, frequency and difficulty urinating.  Neurological: Negative for dizziness, light-headedness, numbness and headaches.       Objective:   Physical Exam  Constitutional: She is oriented to person, place, and time. She appears well-developed and well-nourished.  HENT:  Head: Normocephalic and atraumatic.  Eyes: Conjunctivae and EOM are normal.  Neck: Normal range of motion. Neck supple. No JVD present. Carotid bruit is not present. No thyromegaly present.  Cardiovascular: Normal rate, regular rhythm and normal heart sounds.   No murmur heard. Pulmonary/Chest: Effort normal and breath sounds normal. No respiratory distress. She has no wheezes. She has no rales. She exhibits no tenderness.  Musculoskeletal: She exhibits no edema.  Neurological: She is alert and oriented to person, place, and time.  Psychiatric: She has a normal mood and affect. Her behavior is normal. Judgment and thought content normal.  Nursing note and vitals reviewed.         Assessment & Plan:

## 2016-02-06 NOTE — Patient Instructions (Signed)
Obesity Obesity is defined as having too much total body fat and a body mass index (BMI) of 30 or more. BMI is an estimate of body fat and is calculated from your height and weight. BMI is typically calculated by your health care provider during regular wellness visits. Obesity happens when you consume more calories than you can burn by exercising or performing daily physical tasks. Prolonged obesity can cause major illnesses or emergencies, such as:  Stroke.  Heart disease.  Diabetes.  Cancer.  Arthritis.  High blood pressure (hypertension).  High cholesterol.  Sleep apnea.  Erectile dysfunction.  Infertility problems. CAUSES   Regularly eating unhealthy foods.  Physical inactivity.  Certain disorders, such as an underactive thyroid (hypothyroidism), Cushing's syndrome, and polycystic ovarian syndrome.  Certain medicines, such as steroids, some depression medicines, and antipsychotics.  Genetics.  Lack of sleep. DIAGNOSIS A health care provider can diagnose obesity after calculating your BMI. Obesity will be diagnosed if your BMI is 30 or higher. There are other methods of measuring obesity levels. Some other methods include measuring your skinfold thickness, your waist circumference, and comparing your hip circumference to your waist circumference. TREATMENT  A healthy treatment program includes some or all of the following:  Long-term dietary changes.  Exercise and physical activity.  Behavioral and lifestyle changes.  Medicine only under the supervision of your health care provider. Medicines may help, but only if they are used with diet and exercise programs. If your BMI is 40 or higher, your health care provider may recommend specialized surgery or programs to help with weight loss. An unhealthy treatment program includes:  Fasting.  Fad diets.  Supplements and drugs. These choices do not succeed in long-term weight control. HOME CARE  INSTRUCTIONS  Exercise and perform physical activity as directed by your health care provider. To increase physical activity, try the following:  Use stairs instead of elevators.  Park farther away from store entrances.  Garden, bike, or walk instead of watching television or using the computer.  Eat healthy, low-calorie foods and drinks on a regular basis. Eat more fruits and vegetables. Use low-calorie cookbooks or take healthy cooking classes.  Limit fast food, sweets, and processed snack foods.  Eat smaller portions.  Keep a daily journal of everything you eat. There are many free websites to help you with this. It may be helpful to measure your foods so you can determine if you are eating the correct portion sizes.  Avoid drinking alcohol. Drink more water and drinks without calories.  Take vitamins and supplements only as recommended by your health care provider.  Weight-loss support groups, registered dietitians, counselors, and stress reduction education can also be very helpful. SEEK IMMEDIATE MEDICAL CARE IF:  You have chest pain or tightness.  You have trouble breathing or feel short of breath.  You have weakness or leg numbness.  You feel confused or have trouble talking.  You have sudden changes in your vision.   This information is not intended to replace advice given to you by your health care provider. Make sure you discuss any questions you have with your health care provider.   Document Released: 09/19/2004 Document Revised: 09/02/2014 Document Reviewed: 09/18/2011 Elsevier Interactive Patient Education 2016 Elsevier Inc.  

## 2016-02-07 LAB — URINE CULTURE
COLONY COUNT: NO GROWTH
ORGANISM ID, BACTERIA: NO GROWTH

## 2016-02-09 ENCOUNTER — Telehealth: Payer: Self-pay | Admitting: Pediatrics

## 2016-02-09 NOTE — Telephone Encounter (Signed)
She was in last week and was given antibiotics and still has a bad persistent cough. She has been using her Singulair, Flonase, and Proair inhaler and she used Nyquil last night. She has 3 days of her antibiotics left. She says she feels that she may need a nebulizer. Please advise. If you call and she doesn't answer she was going to take a shower please call her again in a few minutes.

## 2016-02-09 NOTE — Telephone Encounter (Signed)
Patient called back again, will inform her to finish her antibiotic and wait and see if she improves by Monday, then give us a call on Monday if not better.

## 2016-03-07 ENCOUNTER — Other Ambulatory Visit: Payer: Self-pay

## 2016-03-07 MED ORDER — VITAMIN D (ERGOCALCIFEROL) 1.25 MG (50000 UNIT) PO CAPS: 50000.0000 [IU] | ORAL_CAPSULE | ORAL | Status: DC

## 2016-03-07 NOTE — Telephone Encounter (Signed)
Refill request for Vitamin D 50,000. Please advise    KP

## 2016-03-08 ENCOUNTER — Encounter: Payer: Self-pay | Admitting: Family Medicine

## 2016-03-08 ENCOUNTER — Ambulatory Visit (INDEPENDENT_AMBULATORY_CARE_PROVIDER_SITE_OTHER): Payer: Managed Care, Other (non HMO) | Admitting: Family Medicine

## 2016-03-08 DIAGNOSIS — K219 Gastro-esophageal reflux disease without esophagitis: Secondary | ICD-10-CM

## 2016-03-08 MED ORDER — RANITIDINE HCL 150 MG PO TABS: 150.0000 mg | ORAL_TABLET | Freq: Two times a day (BID) | ORAL | Status: DC

## 2016-03-08 MED ORDER — LIRAGLUTIDE -WEIGHT MANAGEMENT 18 MG/3ML ~~LOC~~ SOPN: 3.0000 mg | PEN_INJECTOR | Freq: Every day | SUBCUTANEOUS | Status: DC

## 2016-03-08 NOTE — Patient Instructions (Signed)

## 2016-03-08 NOTE — Assessment & Plan Note (Signed)
Discuss diet and exercise Pt will try saxenda again rto 1 month or sooner prn

## 2016-03-08 NOTE — Progress Notes (Signed)
Subjective:    Patient ID: Kathryn Boyer, female    DOB: 1956-03-12, 60 y.o.   MRN: [id number]  Chief Complaint  Patient presents with  . Follow-up    HPI Patient is in today for f/u weight.  She stopped the saxenda because of gurgling in throat.    Past Medical History  Diagnosis Date  . Thyroid disease   . Hyperlipidemia   . Hypertension   . Anxiety   . Plantar fasciitis     Past Surgical History  Procedure Laterality Date  . Abdominal hysterectomy    . Nose surgery    . Breast surgery      Family History  Problem Relation Age of Onset  . Heart disease Mother   . Stroke Mother   . Hypertension Mother   . Cancer Mother     ovarian  . Aneurysm Father   . Hypertension Father   . Heart disease Father   . Kidney disease Father   . Heart disease Sister     Social History   Social History  . Marital Status: Married    Spouse Name: N/A  . Number of Children: N/A  . Years of Education: N/A   Occupational History  . Not on file.   Social History Main Topics  . Smoking status: Never Smoker   . Smokeless tobacco: Not on file  . Alcohol Use: No  . Drug Use: No  . Sexual Activity: Not on file   Other Topics Concern  . Not on file   Social History Narrative    Outpatient Prescriptions Prior to Visit  Medication Sig Dispense Refill  . ALPRAZolam (XANAX) 0.25 MG tablet Take 1 tablet (0.25 mg total) by mouth at bedtime as needed for anxiety. 30 tablet 0  . conjugated estrogens (PREMARIN) vaginal cream Place 1 Applicatorful vaginally daily. 42.5 g 12  . cyclobenzaprine (FLEXERIL) 10 MG tablet Take 1 tablet (10 mg total) by mouth 3 (three) times daily as needed for muscle spasms. 30 tablet 0  . FLUNISOLIDE, NASAL, NA Place into the nose.    . levothyroxine (SYNTHROID, LEVOTHROID) 100 MCG tablet Take 1 tablet (100 mcg total) by mouth daily before breakfast. 30 tablet 5  . losartan (COZAAR) 100 MG tablet Take 1 tablet (100 mg total) by mouth daily. 30 tablet 5    . montelukast (SINGULAIR) 10 MG tablet Take 1 tablet (10 mg total) by mouth daily. 30 tablet 5  . Pitavastatin Calcium (LIVALO) 4 MG TABS Take by mouth.    Marland Kitchen PROAIR HFA 108 (90 Base) MCG/ACT inhaler Inhale 2 puffs into the lungs every 4 (four) hours as needed for wheezing or shortness of breath. 1 Inhaler 3  . Vitamin D, Ergocalciferol, (DRISDOL) 50000 units CAPS capsule Take 1 capsule (50,000 Units total) by mouth every 7 (seven) days. 4 capsule 1  . amoxicillin-clavulanate (AUGMENTIN) 875-125 MG tablet Take 1 tablet every 12 hours for 10 days for infection 20 tablet 0  . Liraglutide -Weight Management (SAXENDA) 18 MG/3ML SOPN Inject 3 mg into the skin daily. (Patient not taking: Reported on 03/08/2016) 5 pen 5   No facility-administered medications prior to visit.    Allergies  Allergen Reactions  . Penicillins Rash    Review of Systems  Constitutional: Negative for fever, chills and malaise/fatigue.  HENT: Negative for congestion and hearing loss.   Eyes: Negative for discharge.  Respiratory: Negative for cough, sputum production and shortness of breath.   Cardiovascular: Negative for chest pain, palpitations  and leg swelling.  Gastrointestinal: Negative for heartburn, nausea, vomiting, abdominal pain, diarrhea, constipation and blood in stool.  Genitourinary: Negative for dysuria, urgency, frequency and hematuria.  Musculoskeletal: Negative for myalgias, back pain and falls.  Skin: Negative for rash.  Neurological: Negative for dizziness, sensory change, loss of consciousness, weakness and headaches.  Endo/Heme/Allergies: Negative for environmental allergies. Does not bruise/bleed easily.  Psychiatric/Behavioral: Negative for depression and suicidal ideas. The patient is not nervous/anxious and does not have insomnia.        Objective:    Physical Exam  Constitutional: She is oriented to person, place, and time. She appears well-developed and well-nourished.  HENT:  Head:  Normocephalic and atraumatic.  Eyes: Conjunctivae and EOM are normal.  Neck: Normal range of motion. Neck supple. No JVD present. Carotid bruit is not present. No thyromegaly present.  Cardiovascular: Normal rate, regular rhythm and normal heart sounds.   No murmur heard. Pulmonary/Chest: Effort normal and breath sounds normal. No respiratory distress. She has no wheezes. She has no rales. She exhibits no tenderness.  Musculoskeletal: She exhibits no edema.  Neurological: She is alert and oriented to person, place, and time.  Psychiatric: She has a normal mood and affect. Her behavior is normal.  Nursing note and vitals reviewed.   BP 135/85 mmHg  Pulse 67  Temp(Src) 98.7 F (37.1 C) (Oral)  Ht  (1.702 m)  Wt 182 lb 9.6 oz (82.827 kg)  BMI 28.59 kg/m2  SpO2 97% Wt Readings from Last 3 Encounters:  03/08/16 182 lb 9.6 oz (82.827 kg)  02/06/16 181 lb (82.101 kg)  02/01/16 180 lb (81.647 kg)     Lab Results  Component Value Date   WBC 7.4 09/08/2015   HGB 15.4* 09/08/2015   HCT 45.7 09/08/2015   PLT 249.0 09/08/2015   GLUCOSE 100* 02/06/2016   CHOL 184 09/08/2015   TRIG 90.0 09/08/2015   HDL 50.80 09/08/2015   LDLCALC 115* 09/08/2015   ALT 26 02/06/2016   AST 22 02/06/2016   NA 139 02/06/2016   K 4.1 02/06/2016   CL 103 02/06/2016   CREATININE 1.08 02/06/2016   BUN 17 02/06/2016   CO2 26 02/06/2016   TSH 1.81 02/06/2016    Lab Results  Component Value Date   TSH 1.81 02/06/2016   Lab Results  Component Value Date   WBC 7.4 09/08/2015   HGB 15.4* 09/08/2015   HCT 45.7 09/08/2015   MCV 89.7 09/08/2015   PLT 249.0 09/08/2015   Lab Results  Component Value Date   NA 139 02/06/2016   K 4.1 02/06/2016   CO2 26 02/06/2016   GLUCOSE 100* 02/06/2016   BUN 17 02/06/2016   CREATININE 1.08 02/06/2016   BILITOT 0.5 02/06/2016   ALKPHOS 81 02/06/2016   AST 22 02/06/2016   ALT 26 02/06/2016   PROT 7.3 02/06/2016   ALBUMIN 4.4 02/06/2016   CALCIUM 9.4  02/06/2016   GFR 54.85* 02/06/2016   Lab Results  Component Value Date   CHOL 184 09/08/2015   Lab Results  Component Value Date   HDL 50.80 09/08/2015   Lab Results  Component Value Date   LDLCALC 115* 09/08/2015   Lab Results  Component Value Date   TRIG 90.0 09/08/2015   Lab Results  Component Value Date   CHOLHDL 4 09/08/2015   No results found for: HGBA1C     Assessment & Plan:   Problem List Items Addressed This Visit      Unprioritized  Morbid obesity (HCC) - Primary    Discuss diet and exercise Pt will try saxenda again rto 1 month or sooner prn      Relevant Medications   Liraglutide -Weight Management (SAXENDA) 18 MG/3ML SOPN    Other Visit Diagnoses    Gastroesophageal reflux disease without esophagitis        Relevant Medications    ranitidine (ZANTAC) 150 MG tablet       I have discontinued Ms. Peri's amoxicillin-clavulanate. I am also having her start on ranitidine. Additionally, I am having her maintain her ALPRAZolam, cyclobenzaprine, levothyroxine, losartan, (FLUNISOLIDE, NASAL, NA), Pitavastatin Calcium, montelukast, PROAIR HFA, conjugated estrogens, Vitamin D (Ergocalciferol), and Liraglutide -Weight Management.  Meds ordered this encounter  Medications  . ranitidine (ZANTAC) 150 MG tablet    Sig: Take 1 tablet (150 mg total) by mouth 2 (two) times daily.    Dispense:  60 tablet    Refill:  0  . Liraglutide -Weight Management (SAXENDA) 18 MG/3ML SOPN    Sig: Inject 3 mg into the skin daily.    Dispense:  5 pen    Refill:  5     Donato SchultzYvonne R Lowne Chase, DO

## 2016-03-20 ENCOUNTER — Encounter: Payer: Self-pay | Admitting: Family Medicine

## 2016-03-20 NOTE — Telephone Encounter (Signed)
Please advise      KP 

## 2016-03-27 NOTE — Telephone Encounter (Signed)
FW: Non-Urgent Medical Question  Sandford Craze, NP  Sent: Wed March 20, 2016 1:08 PM  To: Kathi Simpers, CMA            Message   I would recommend OV because it could be diverticulitis.     Melissa    Spoke with pt. She states that her left side and back pain went away after a couple of days. States she also stopped the Saxenda. Reports that her indigestion symptoms (Has "gurgling in her throat") get better when she is off the medication but still persists. Feels that symptoms worsen after the second week on medication. Also notes that she has started a new medication for osteoporosis and wonders if that could be contributing to her symptoms as well. Has tried zantac without relief. I recommended appt as advised above and pt scheduled appt for 03/28/16 with PCP at 11:15am.

## 2016-03-28 ENCOUNTER — Telehealth: Payer: Self-pay | Admitting: Family Medicine

## 2016-03-28 ENCOUNTER — Ambulatory Visit: Payer: Managed Care, Other (non HMO) | Admitting: Family Medicine

## 2016-03-28 DIAGNOSIS — Z0289 Encounter for other administrative examinations: Secondary | ICD-10-CM

## 2016-03-28 NOTE — Telephone Encounter (Signed)
Patient LVM 8:27am cancelling 11:15am appointment for today.charge or no charge

## 2016-03-28 NOTE — Telephone Encounter (Signed)
charge 

## 2016-04-04 ENCOUNTER — Encounter: Payer: Self-pay | Admitting: Family Medicine

## 2016-04-08 ENCOUNTER — Encounter: Payer: Self-pay | Admitting: Family Medicine

## 2016-04-08 ENCOUNTER — Ambulatory Visit (INDEPENDENT_AMBULATORY_CARE_PROVIDER_SITE_OTHER): Payer: Managed Care, Other (non HMO) | Admitting: Family Medicine

## 2016-04-08 VITALS — BP 132/90 | HR 75 | Temp 98.4°F | Wt 183.4 lb

## 2016-04-08 DIAGNOSIS — E669 Obesity, unspecified: Secondary | ICD-10-CM | POA: Diagnosis not present

## 2016-04-08 MED ORDER — LORCASERIN HCL ER 20 MG PO TB24: 1.0000 | ORAL_TABLET | Freq: Every day | ORAL | 2 refills | Status: DC

## 2016-04-08 NOTE — Progress Notes (Signed)
Pre visit review using our clinic review tool, if applicable. No additional management support is needed unless otherwise documented below in the visit note. 

## 2016-04-08 NOTE — Patient Instructions (Signed)
Obesity Obesity is defined as having too much total body fat and a body mass index (BMI) of 30 or more. BMI is an estimate of body fat and is calculated from your height and weight. BMI is typically calculated by your health care provider during regular wellness visits. Obesity happens when you consume more calories than you can burn by exercising or performing daily physical tasks. Prolonged obesity can cause major illnesses or emergencies, such as:  Stroke.  Heart disease.  Diabetes.  Cancer.  Arthritis.  High blood pressure (hypertension).  High cholesterol.  Sleep apnea.  Erectile dysfunction.  Infertility problems. CAUSES   Regularly eating unhealthy foods.  Physical inactivity.  Certain disorders, such as an underactive thyroid (hypothyroidism), Cushing's syndrome, and polycystic ovarian syndrome.  Certain medicines, such as steroids, some depression medicines, and antipsychotics.  Genetics.  Lack of sleep. DIAGNOSIS A health care provider can diagnose obesity after calculating your BMI. Obesity will be diagnosed if your BMI is 30 or higher. There are other methods of measuring obesity levels. Some other methods include measuring your skinfold thickness, your waist circumference, and comparing your hip circumference to your waist circumference. TREATMENT  A healthy treatment program includes some or all of the following:  Long-term dietary changes.  Exercise and physical activity.  Behavioral and lifestyle changes.  Medicine only under the supervision of your health care provider. Medicines may help, but only if they are used with diet and exercise programs. If your BMI is 40 or higher, your health care provider may recommend specialized surgery or programs to help with weight loss. An unhealthy treatment program includes:  Fasting.  Fad diets.  Supplements and drugs. These choices do not succeed in long-term weight control. HOME CARE  INSTRUCTIONS  Exercise and perform physical activity as directed by your health care provider. To increase physical activity, try the following:  Use stairs instead of elevators.  Park farther away from store entrances.  Garden, bike, or walk instead of watching television or using the computer.  Eat healthy, low-calorie foods and drinks on a regular basis. Eat more fruits and vegetables. Use low-calorie cookbooks or take healthy cooking classes.  Limit fast food, sweets, and processed snack foods.  Eat smaller portions.  Keep a daily journal of everything you eat. There are many free websites to help you with this. It may be helpful to measure your foods so you can determine if you are eating the correct portion sizes.  Avoid drinking alcohol. Drink more water and drinks without calories.  Take vitamins and supplements only as recommended by your health care provider.  Weight-loss support groups, registered dietitians, counselors, and stress reduction education can also be very helpful. SEEK IMMEDIATE MEDICAL CARE IF:  You have chest pain or tightness.  You have trouble breathing or feel short of breath.  You have weakness or leg numbness.  You feel confused or have trouble talking.  You have sudden changes in your vision.   This information is not intended to replace advice given to you by your health care provider. Make sure you discuss any questions you have with your health care provider.   Document Released: 09/19/2004 Document Revised: 09/02/2014 Document Reviewed: 09/18/2011 Elsevier Interactive Patient Education 2016 Elsevier Inc.  

## 2016-04-08 NOTE — Progress Notes (Signed)
Patient ID: Kathryn BachelorDiane Steely, female    DOB: 06-Jul-1956  Age: 60 y.o. MRN: [id number]    Subjective:  Subjective  HPI Shaye Marissa CalamityBallard presents for weight check --- saxenda caused severe heart burn.  She is requesting something else.    Review of Systems  Constitutional: Negative for appetite change, diaphoresis, fatigue and unexpected weight change.  Eyes: Negative for pain, redness and visual disturbance.  Respiratory: Negative for cough, chest tightness, shortness of breath and wheezing.   Cardiovascular: Negative for chest pain, palpitations and leg swelling.  Endocrine: Negative for cold intolerance, heat intolerance, polydipsia, polyphagia and polyuria.  Genitourinary: Negative for difficulty urinating, dysuria and frequency.  Neurological: Negative for dizziness, light-headedness, numbness and headaches.    History Past Medical History:  Diagnosis Date  . Anxiety   . Hyperlipidemia   . Hypertension   . Plantar fasciitis   . Thyroid disease     She has a past surgical history that includes Abdominal hysterectomy; Nose surgery; and Breast surgery.   Her family history includes Aneurysm in her father; Cancer in her mother; Heart disease in her father, mother, and sister; Hypertension in her father and mother; Kidney disease in her father; Stroke in her mother.She reports that she has never smoked. She does not have any smokeless tobacco history on file. She reports that she does not drink alcohol or use drugs.  Current Outpatient Prescriptions on File Prior to Visit  Medication Sig Dispense Refill  . ALPRAZolam (XANAX) 0.25 MG tablet Take 1 tablet (0.25 mg total) by mouth at bedtime as needed for anxiety. 30 tablet 0  . conjugated estrogens (PREMARIN) vaginal cream Place 1 Applicatorful vaginally daily. 42.5 g 12  . cyclobenzaprine (FLEXERIL) 10 MG tablet Take 1 tablet (10 mg total) by mouth 3 (three) times daily as needed for muscle spasms. 30 tablet 0  . FLUNISOLIDE, NASAL, NA  Place into the nose.    . levothyroxine (SYNTHROID, LEVOTHROID) 100 MCG tablet Take 1 tablet (100 mcg total) by mouth daily before breakfast. 30 tablet 5  . Liraglutide -Weight Management (SAXENDA) 18 MG/3ML SOPN Inject 3 mg into the skin daily. 5 pen 5  . losartan (COZAAR) 100 MG tablet Take 1 tablet (100 mg total) by mouth daily. 30 tablet 5  . montelukast (SINGULAIR) 10 MG tablet Take 1 tablet (10 mg total) by mouth daily. 30 tablet 5  . Pitavastatin Calcium (LIVALO) 4 MG TABS Take by mouth.    Marland Kitchen. PROAIR HFA 108 (90 Base) MCG/ACT inhaler Inhale 2 puffs into the lungs every 4 (four) hours as needed for wheezing or shortness of breath. 1 Inhaler 3  . ranitidine (ZANTAC) 150 MG tablet Take 1 tablet (150 mg total) by mouth 2 (two) times daily. 60 tablet 0  . Vitamin D, Ergocalciferol, (DRISDOL) 50000 units CAPS capsule Take 1 capsule (50,000 Units total) by mouth every 7 (seven) days. 4 capsule 1   No current facility-administered medications on file prior to visit.      Objective:  Objective  Physical Exam  Constitutional: She is oriented to person, place, and time. She appears well-developed and well-nourished.  HENT:  Head: Normocephalic and atraumatic.  Eyes: Conjunctivae and EOM are normal.  Neck: Normal range of motion. Neck supple. No JVD present. Carotid bruit is not present. No thyromegaly present.  Cardiovascular: Normal rate, regular rhythm and normal heart sounds.   No murmur heard. Pulmonary/Chest: Effort normal and breath sounds normal. No respiratory distress. She has no wheezes. She has no rales.  She exhibits no tenderness.  Musculoskeletal: She exhibits no edema.  Neurological: She is alert and oriented to person, place, and time.  Psychiatric: She has a normal mood and affect.  Nursing note and vitals reviewed.  BP 132/90 (BP Location: Right Arm, Patient Position: Sitting, Cuff Size: Normal)   Pulse 75   Temp 98.4 F (36.9 C) (Oral)   Wt 183 lb 6.4 oz (83.2 kg)    SpO2 96%   BMI 28.72 kg/m  Wt Readings from Last 3 Encounters:  04/08/16 183 lb 6.4 oz (83.2 kg)  03/08/16 182 lb 9.6 oz (82.8 kg)  02/06/16 181 lb (82.1 kg)     Lab Results  Component Value Date   WBC 7.4 09/08/2015   HGB 15.4 (H) 09/08/2015   HCT 45.7 09/08/2015   PLT 249.0 09/08/2015   GLUCOSE 100 (H) 02/06/2016   CHOL 184 09/08/2015   TRIG 90.0 09/08/2015   HDL 50.80 09/08/2015   LDLCALC 115 (H) 09/08/2015   ALT 26 02/06/2016   AST 22 02/06/2016   NA 139 02/06/2016   K 4.1 02/06/2016   CL 103 02/06/2016   CREATININE 1.08 02/06/2016   BUN 17 02/06/2016   CO2 26 02/06/2016   TSH 1.81 02/06/2016    [place] Abdomen Complete  Result Date: 07/28/2015 CLINICAL DATA:  Sporadic generalized abdominal pain with nausea. Reflux for 2-3 months EXAM: ULTRASOUND ABDOMEN COMPLETE COMPARISON:  Abdominal CT from 3 days ago FINDINGS: Gallbladder: No gallstones or wall thickening visualized. No sonographic Murphy sign noted. Common bile duct: Diameter: 4 mm. Where visualized, no filling defect. Liver: Heterogeneous and echogenic liver correlating with steatosis on previous CT. There are areas of sparing. No focal mass lesion is seen. Antegrade flow in the imaged portal venous system. IVC: Negative by grayscale. Pancreas: Visualized portion unremarkable. Spleen: Size and appearance within normal limits. Right Kidney: Length: 11 cm. Echogenicity within normal limits. No mass or hydronephrosis visualized. Left Kidney: Length: 11 cm. Echogenicity within normal limits. No mass or hydronephrosis visualized. Abdominal aorta: No aneurysm visualized. Other findings: None. IMPRESSION: Hepatic steatosis.  Otherwise, negative abdominal ultrasound. Electronically Signed   By: Marnee SpringJonathon  Watts M.D.   On: 07/28/2015 10:51     Assessment & Plan:  Plan  I am having Ms. Dollinger start on Lorcaserin HCl ER. I am also having her maintain her ALPRAZolam, cyclobenzaprine, levothyroxine, losartan, (FLUNISOLIDE, NASAL, NA),  Pitavastatin Calcium, montelukast, PROAIR HFA, conjugated estrogens, Vitamin D (Ergocalciferol), ranitidine, Liraglutide -Weight Management, and Risedronate Sodium.  Meds ordered this encounter  Medications  . Risedronate Sodium 35 MG TBEC    Sig: Take 35 mg by mouth once a week.  . Lorcaserin HCl ER (BELVIQ XR) 20 MG TB24    Sig: Take 1 tablet by mouth daily.    Dispense:  30 tablet    Refill:  2    Problem List Items Addressed This Visit    None    Visit Diagnoses    Obesity    -  Primary   Relevant Medications   Lorcaserin HCl ER (BELVIQ XR) 20 MG TB24    con' t with diet and exercise  Follow-up: Return in about 4 weeks (around 05/06/2016), or if symptoms worsen or fail to improve, for weight check.  Donato SchultzYvonne R Lowne Chase, DO

## 2016-05-01 ENCOUNTER — Telehealth: Payer: Self-pay | Admitting: *Deleted

## 2016-05-01 NOTE — Telephone Encounter (Signed)
PA initiated on [email or web address] KEY:[id number] Awaiting response.//AB/CMA

## 2016-05-02 NOTE — Telephone Encounter (Signed)
[place]  Placed letter in folder for Dr. Zola ButtonLowne Chase to review and make recommendation.//AB/CMA

## 2016-05-08 NOTE — Telephone Encounter (Signed)
Per Dr. Zola ButtonLowne Chase she would like for the pt to call her insurance company to see what they will cover if anything.  Called and spoke with the pt and she stated that she had to stop the Belviq XR 20mg  because it caused her face to turn red and she started to itch.  She stated that it was not working either.  Pt stated that she thought about retrying the Saxenda.  Even though it caused her to have indigestion, it did work.  Informed the pt of Dr. Zola ButtonLowne Chase recommendation and she stated that she would give her insurance a call.  Denial letter sent for scanning.//AB/CMA

## 2016-05-13 ENCOUNTER — Other Ambulatory Visit: Payer: Self-pay | Admitting: Allergy

## 2016-05-13 MED ORDER — FLUNISOLIDE 25 MCG/ACT (0.025%) NA SOLN: 2.0000 | Freq: Two times a day (BID) | NASAL | 5 refills | Status: DC

## 2016-05-26 ENCOUNTER — Encounter (HOSPITAL_BASED_OUTPATIENT_CLINIC_OR_DEPARTMENT_OTHER): Payer: Self-pay | Admitting: Emergency Medicine

## 2016-05-26 ENCOUNTER — Emergency Department (HOSPITAL_BASED_OUTPATIENT_CLINIC_OR_DEPARTMENT_OTHER)
Admission: EM | Admit: 2016-05-26 | Discharge: 2016-05-26 | Disposition: A | Discharge status: Home / Self Care | Payer: Managed Care, Other (non HMO) | Attending: Emergency Medicine | Admitting: Emergency Medicine

## 2016-05-26 DIAGNOSIS — Z79899 Other long term (current) drug therapy: Secondary | ICD-10-CM | POA: Diagnosis not present

## 2016-05-26 DIAGNOSIS — L0291 Cutaneous abscess, unspecified: Secondary | ICD-10-CM

## 2016-05-26 DIAGNOSIS — L02413 Cutaneous abscess of right upper limb: Secondary | ICD-10-CM | POA: Diagnosis not present

## 2016-05-26 DIAGNOSIS — J45901 Unspecified asthma with (acute) exacerbation: Secondary | ICD-10-CM | POA: Diagnosis not present

## 2016-05-26 DIAGNOSIS — I10 Essential (primary) hypertension: Secondary | ICD-10-CM | POA: Diagnosis not present

## 2016-05-26 DIAGNOSIS — R2231 Localized swelling, mass and lump, right upper limb: Secondary | ICD-10-CM | POA: Diagnosis present

## 2016-05-26 MED ORDER — LIDOCAINE HCL 2 % IJ SOLN
20.0000 mL | Freq: Once | INTRAMUSCULAR | Status: AC
  Administered 2016-05-26: 400 mg
  Filled 2016-05-26: qty 20

## 2016-05-26 MED ORDER — LIDOCAINE HCL (PF) 1 % IJ SOLN
INTRAMUSCULAR | Status: AC
  Administered 2016-05-26: 11:00:00
  Filled 2016-05-26: qty 5

## 2016-05-26 MED ORDER — SULFAMETHOXAZOLE-TRIMETHOPRIM 800-160 MG PO TABS: 1.0000 | ORAL_TABLET | Freq: Two times a day (BID) | ORAL | 0 refills | Status: AC

## 2016-05-26 NOTE — Discharge Instructions (Signed)
Please read attached information. If you experience any new or worsening signs or symptoms please return to the emergency room for evaluation. Please follow-up with your primary care provider or specialist as discussed. Please use medication prescribed only as directed and discontinue taking if you have any concerning signs or symptoms.   °

## 2016-05-26 NOTE — ED Triage Notes (Signed)
Round red raised area to R shoulder since Friday with pain.

## 2016-05-26 NOTE — ED Provider Notes (Signed)
[facility] Provider Note   CSN: [id number] Arrival date & time: 05/26/16  [id number]   History   Chief Complaint Chief Complaint  Patient presents with  . Insect Bite    HPI Kathryn Boyer is a 60 y.o. female.  HPI   60 year old female presents today with complaints of redness and swelling to her right posterior shoulder. Patient reports symptoms started yesterday, she notes that she was out in the garden but denies any acute onset of symptoms. She reports the redness has continued to spread with firmness and a mass to the posterior shoulder. She reports that she started taking Biotene, and states that previous intake and Biotene has caused acne on her face and back. She denies any fever or chills, rash, itchiness, or any other significant findings. She denies any history of the same, no history of significant skin infections, no diabetes.  Past Medical History:  Diagnosis Date  . Anxiety   . Hyperlipidemia   . Hypertension   . Plantar fasciitis   . Thyroid disease     Patient Active Problem List   Diagnosis Date Noted  . Morbid obesity (HCC) 02/06/2016  . Vaginal dryness 02/06/2016  . Allergic rhinitis due to pollen 02/01/2016  . Acute maxillary sinusitis 02/01/2016  . Essential hypertension 02/01/2016  . Asthma with acute exacerbation 02/01/2016  . Lumbar radiculopathy, acute 11/01/2015  . Osteoporosis, 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  . Adjustment disorder with anxiety 02/08/2015  . Abnormal weight gain 02/08/2015    Past Surgical History:  Procedure Laterality Date  . ABDOMINAL HYSTERECTOMY    . BREAST SURGERY    . NOSE SURGERY      OB History    No data available       Home Medications    Prior to Admission medications   Medication Sig Start Date End Date Taking? Authorizing Provider  ALPRAZolam (XANAX) 0.25 MG tablet Take 1 tablet (0.25 mg total) by mouth at bedtime as needed for anxiety. 05/26/15   Lelon PerlaYvonne R Lowne Chase, DO  conjugated estrogens  (PREMARIN) vaginal cream Place 1 Applicatorful vaginally daily. 02/06/16   Lelon PerlaYvonne R Lowne Chase, DO  cyclobenzaprine (FLEXERIL) 10 MG tablet Take 1 tablet (10 mg total) by mouth 3 (three) times daily as needed for muscle spasms. 10/31/15   Grayling CongressYvonne R Lowne Chase, DO  flunisolide (NASALIDE) 25 MCG/ACT (0.025%) SOLN Place 2 sprays into the nose 2 (two) times daily. 05/13/16   Fletcher AnonJose A Bardelas, MD  FLUNISOLIDE, NASAL, NA Place into the nose.    Historical Provider, MD  levothyroxine (SYNTHROID, LEVOTHROID) 100 MCG tablet Take 1 tablet (100 mcg total) by mouth daily before breakfast. 12/21/15   Lelon PerlaYvonne R Lowne Chase, DO  Liraglutide -Weight Management (SAXENDA) 18 MG/3ML SOPN Inject 3 mg into the skin daily. 03/08/16   Lelon PerlaYvonne R Lowne Chase, DO  Lorcaserin HCl ER (BELVIQ XR) 20 MG TB24 Take 1 tablet by mouth daily. 04/08/16   Lelon PerlaYvonne R Lowne Chase, DO  losartan (COZAAR) 100 MG tablet Take 1 tablet (100 mg total) by mouth daily. 01/18/16   Grayling CongressYvonne R Lowne Chase, DO  montelukast (SINGULAIR) 10 MG tablet Take 1 tablet (10 mg total) by mouth daily. 02/01/16   Fletcher AnonJose A Bardelas, MD  Pitavastatin Calcium (LIVALO) 4 MG TABS Take by mouth.    Historical Provider, MD  PROAIR HFA 108 [phone](90 Base) MCG/ACT inhaler Inhale 2 puffs into the lungs every 4 (four) hours as needed for wheezing or shortness of breath. 02/01/16   Fletcher AnonJose A Bardelas,  MD  ranitidine (ZANTAC) 150 MG tablet Take 1 tablet (150 mg total) by mouth 2 (two) times daily. 03/08/16   Lelon Perla Chase, DO  Risedronate Sodium 35 MG TBEC Take 35 mg by mouth once a week. 03/15/16   Historical Provider, MD  sulfamethoxazole-trimethoprim (BACTRIM DS,SEPTRA DS) 800-160 MG tablet Take 1 tablet by mouth 2 (two) times daily. 05/26/16 06/02/16  Eyvonne Mechanic, PA-C  Vitamin D, Ergocalciferol, (DRISDOL) 50000 units CAPS capsule Take 1 capsule (50,000 Units total) by mouth every 7 (seven) days. 03/07/16   Donato Schultz, DO    Family History Family History  Problem Relation Age of  Onset  . Heart disease Mother   . Stroke Mother   . Hypertension Mother   . Cancer Mother     ovarian  . Aneurysm Father   . Hypertension Father   . Heart disease Father   . Kidney disease Father   . Heart disease Sister     Social History Social History  Substance Use Topics  . Smoking status: Never Smoker  . Smokeless tobacco: Never Used  . Alcohol use No     Allergies   Belviq xr [lorcaserin hcl er]; Saxenda [liraglutide -weight management]; and Penicillins   Review of Systems Review of Systems  All other systems reviewed and are negative.    Physical Exam Updated Vital Signs BP 129/83 (BP Location: Right Arm)   Pulse 70   Temp 98 F (36.7 C) (Oral)   Resp 18   Ht 5\' 7"  (1.702 m)   Wt 81.6 kg   SpO2 100%   BMI 28.19 kg/m   Physical Exam  Constitutional: She is oriented to person, place, and time. She appears well-developed and well-nourished.  HENT:  Head: Normocephalic and atraumatic.  Eyes: Conjunctivae are normal. Pupils are equal, round, and reactive to light. Right eye exhibits no discharge. Left eye exhibits no discharge. No scleral icterus.  Neck: Normal range of motion. No JVD present. No tracheal deviation present.  Pulmonary/Chest: Effort normal. No stridor.  Neurological: She is alert and oriented to person, place, and time. Coordination normal.  Skin:  2 cm area of induration with surrounding redness warmth to touch to the right posterior shoulder no fluctuance  Psychiatric: She has a normal mood and affect. Her behavior is normal. Judgment and thought content normal.  Nursing note and vitals reviewed.    ED Treatments / Results  Labs (all labs ordered are listed, but only abnormal results are displayed) Labs Reviewed - No data to display  EKG  EKG Interpretation None       Radiology No results found.  Procedures Procedures (including critical care time)  INCISION AND DRAINAGE Performed by: Thermon Leyland Consent:  Verbal consent obtained. Risks and benefits: risks, benefits and alternatives were discussed Type: abscess  Body area: posterior right shoulder  Anesthesia: local infiltration  Incision was made with a scalpel.  Local anesthetic: lidocaine 2% 0 epinephrine  Anesthetic total: 3 ml  Complexity: complex Blunt dissection to break up loculations  Drainage: purulent  Drainage amount: 1 cc  e  Patient tolerance: Patient tolerated the procedure well with no immediate complications.    Medications Ordered in ED Medications  lidocaine (XYLOCAINE) 2 % (with pres) injection 400 mg (400 mg Infiltration Given 05/26/16 1021)  lidocaine (PF) (XYLOCAINE) 1 % injection (  Given 05/26/16 1111)     Initial Impression / Assessment and Plan / ED Course  I have reviewed the triage vital signs  and the nursing notes.  Pertinent labs & imaging results that were available during my care of the patient were reviewed by me and considered in my medical decision making (see chart for details).  Clinical Course     Final Clinical Impressions(s) / ED Diagnoses   Final diagnoses:  Abscess    Labs:  Imaging:  Consults:  Therapeutics:  Discharge Meds:   Assessment/Plan:  Patient presents with abscess to her posterior shoulder, small amount of chronic cellulitis. Afebrile nontoxic, I&D. Drainage. Patient placed on Bactrim, close follow-up with primary care. Strict return precautions given.     New Prescriptions Discharge Medication List as of 05/26/2016 11:01 AM    START taking these medications   Details  sulfamethoxazole-trimethoprim (BACTRIM DS,SEPTRA DS) 800-160 MG tablet Take 1 tablet by mouth 2 (two) times daily., Starting Sun 05/26/2016, Until Sun 06/02/2016, Print         Eyvonne Mechanic, PA-C 05/26/16 1215    Jerelyn Scott, MD 05/26/16 1227

## 2016-07-08 ENCOUNTER — Ambulatory Visit (INDEPENDENT_AMBULATORY_CARE_PROVIDER_SITE_OTHER): Payer: Managed Care, Other (non HMO)

## 2016-07-08 DIAGNOSIS — Z23 Encounter for immunization: Secondary | ICD-10-CM

## 2016-07-10 ENCOUNTER — Telehealth: Payer: Self-pay | Admitting: Family Medicine

## 2016-07-10 ENCOUNTER — Other Ambulatory Visit (INDEPENDENT_AMBULATORY_CARE_PROVIDER_SITE_OTHER): Payer: Managed Care, Other (non HMO)

## 2016-07-10 ENCOUNTER — Other Ambulatory Visit: Payer: Self-pay | Admitting: *Deleted

## 2016-07-10 DIAGNOSIS — R350 Frequency of micturition: Secondary | ICD-10-CM | POA: Diagnosis not present

## 2016-07-10 DIAGNOSIS — R300 Dysuria: Secondary | ICD-10-CM

## 2016-07-10 LAB — POCT URINALYSIS DIPSTICK
KETONES UA: NEGATIVE
Nitrite, UA: POSITIVE
PH UA: 6
Protein, UA: NEGATIVE
SPEC GRAV UA: 1.015
Urobilinogen, UA: 1

## 2016-07-10 MED ORDER — CIPROFLOXACIN HCL 500 MG PO TABS: 500.0000 mg | ORAL_TABLET | Freq: Two times a day (BID) | ORAL | 0 refills | Status: DC

## 2016-07-10 NOTE — Telephone Encounter (Signed)
Caller name:Teesha Padin Relationship to patient: Can be reached:[phone] Pharmacy:  Reason for call:Requesting UA results, needs to know asap, leaving town

## 2016-07-10 NOTE — Telephone Encounter (Signed)
Pt notified of instructions and verbalized understanding. Lab appt scheduled.

## 2016-07-10 NOTE — Telephone Encounter (Signed)
Please advise if okay to schedule lab only appt for UA + culture for urinary burning and frequency.

## 2016-07-10 NOTE — Telephone Encounter (Signed)
Also wanting to add urine culture

## 2016-07-10 NOTE — Telephone Encounter (Signed)
Caller name: Kathryn Boyer Relation to pt: self  Call back number: [phone] Pharmacy[facility] [place]  Reason for call: Pt states is having bad urine infection and wants to know if she can have a UTI done for today this morning, pt states can not come in our office for a doctor visit since she does not have time but is hurting and wants to have it solved ASAP. Pt states Dr Laury AxonLowne is aware that she has issues with urine infection. Please advise ASAP.

## 2016-07-10 NOTE — Telephone Encounter (Signed)
Notified pt and she voices understanding. 

## 2016-07-10 NOTE — Telephone Encounter (Signed)
Check a UA and urine culture. Further advise with results. Urgent care or ER if severe symptoms, fever, chills, increased stomach pain or flank pain.

## 2016-07-10 NOTE — Telephone Encounter (Signed)
UA suggests UTI. Rx sent for cipro. Needs OV if new/worsening symptoms, fever or if symptoms do not improve on cipro

## 2016-07-12 ENCOUNTER — Telehealth: Payer: Self-pay

## 2016-07-12 ENCOUNTER — Other Ambulatory Visit: Payer: Self-pay | Admitting: Family Medicine

## 2016-07-12 ENCOUNTER — Other Ambulatory Visit: Payer: Self-pay

## 2016-07-12 DIAGNOSIS — E785 Hyperlipidemia, unspecified: Secondary | ICD-10-CM

## 2016-07-12 DIAGNOSIS — K219 Gastro-esophageal reflux disease without esophagitis: Secondary | ICD-10-CM

## 2016-07-12 MED ORDER — RANITIDINE HCL 150 MG PO TABS: 150.0000 mg | ORAL_TABLET | Freq: Two times a day (BID) | ORAL | 0 refills | Status: DC

## 2016-07-12 MED ORDER — PITAVASTATIN CALCIUM 4 MG PO TABS: ORAL_TABLET | ORAL | 2 refills | Status: DC

## 2016-07-12 NOTE — Telephone Encounter (Signed)
I sent it  

## 2016-07-12 NOTE — Telephone Encounter (Signed)
Medication has not been prescribed by you. Please advise PC

## 2016-07-13 LAB — CULTURE, URINE COMPREHENSIVE

## 2016-08-14 ENCOUNTER — Telehealth: Payer: Self-pay | Admitting: Family Medicine

## 2016-08-14 ENCOUNTER — Other Ambulatory Visit: Payer: Self-pay

## 2016-08-14 ENCOUNTER — Telehealth: Payer: Self-pay

## 2016-08-14 MED ORDER — LEVOTHYROXINE SODIUM 100 MCG PO TABS: 100.0000 ug | ORAL_TABLET | Freq: Every day | ORAL | 5 refills | Status: DC

## 2016-08-14 NOTE — Telephone Encounter (Signed)
Rx sent to pharmacy. LB 

## 2016-08-14 NOTE — Telephone Encounter (Signed)
Relation to ZO:XWRUpt:self Call back number:[phone] Pharmacy: [facility] - [place], [place] - [address] [phone] (Phone) [phone] (Fax)     Reason for call:  Patient requesting a refill levothyroxine (SYNTHROID, LEVOTHROID) 100 MCG tablet, please call pharmacy directly as per patient.

## 2016-08-14 NOTE — Telephone Encounter (Signed)
Pt requested pharmacy be changed from [facility] to [facility] [address].  [facility]arm[facility] changed in [facility] [facility] [facility] as patient requested.

## 2016-09-16 ENCOUNTER — Telehealth: Payer: Self-pay

## 2016-09-16 NOTE — Telephone Encounter (Signed)
PA initiated via Covermymeds; KEY: [id number]. Received real-time approval. Medication approved through 09/25/2017.

## 2016-09-17 ENCOUNTER — Encounter: Payer: Self-pay | Admitting: Family Medicine

## 2016-09-17 ENCOUNTER — Ambulatory Visit (INDEPENDENT_AMBULATORY_CARE_PROVIDER_SITE_OTHER): Payer: Managed Care, Other (non HMO) | Admitting: Family Medicine

## 2016-09-17 VITALS — BP 122/82 | HR 80 | Temp 98.3°F | Resp 16 | Ht 67.0 in | Wt 185.2 lb

## 2016-09-17 DIAGNOSIS — E785 Hyperlipidemia, unspecified: Secondary | ICD-10-CM

## 2016-09-17 DIAGNOSIS — E039 Hypothyroidism, unspecified: Secondary | ICD-10-CM | POA: Diagnosis not present

## 2016-09-17 DIAGNOSIS — Z0000 Encounter for general adult medical examination without abnormal findings: Secondary | ICD-10-CM | POA: Diagnosis not present

## 2016-09-17 NOTE — Patient Instructions (Signed)

## 2016-09-17 NOTE — Progress Notes (Signed)
Subjective:    Patient ID: Kathryn Boyer, female    DOB: [birth year], 61 y.o.   MRN: [id number]  Chief Complaint  Patient presents with  . Annual Exam    not fasting.    HPI Patient is in today for annual physical.  Past Medical History:  Diagnosis Date  . Anxiety   . Hyperlipidemia   . Hypertension   . Plantar fasciitis   . Thyroid disease     Past Surgical History:  Procedure Laterality Date  . ABDOMINAL HYSTERECTOMY    . BREAST SURGERY    . NOSE SURGERY      Family History  Problem Relation Age of Onset  . Heart disease Mother   . Stroke Mother   . Hypertension Mother   . Cancer Mother     ovarian  . Aneurysm Father   . Hypertension Father   . Heart disease Father   . Kidney disease Father   . Heart disease Sister     Social History   Social History  . Marital status: Married    Spouse name: N/A  . Number of children: N/A  . Years of education: N/A   Occupational History  . Not on file.   Social History Main Topics  . Smoking status: Never Smoker  . Smokeless tobacco: Never Used  . Alcohol use No  . Drug use: No  . Sexual activity: Not on file   Other Topics Concern  . Not on file   Social History Narrative  . No narrative on file    Outpatient Medications Prior to Visit  Medication Sig Dispense Refill  . ALPRAZolam (XANAX) 0.25 MG tablet Take 1 tablet (0.25 mg total) by mouth at bedtime as needed for anxiety. 30 tablet 0  . conjugated estrogens (PREMARIN) vaginal cream Place 1 Applicatorful vaginally daily. 42.5 g 12  . cyclobenzaprine (FLEXERIL) 10 MG tablet Take 1 tablet (10 mg total) by mouth 3 (three) times daily as needed for muscle spasms. 30 tablet 0  . flunisolide (NASALIDE) 25 MCG/ACT (0.025%) SOLN Place 2 sprays into the nose 2 (two) times daily. 1 Bottle 5  . FLUNISOLIDE, NASAL, NA Place into the nose.    . levothyroxine (SYNTHROID, LEVOTHROID) 100 MCG tablet Take 1 tablet (100 mcg total) by mouth daily before breakfast. 30  tablet 5  . Liraglutide -Weight Management (SAXENDA) 18 MG/3ML SOPN Inject 3 mg into the skin daily. 5 pen 5  . losartan (COZAAR) 100 MG tablet Take 1 tablet (100 mg total) by mouth daily. 30 tablet 5  . montelukast (SINGULAIR) 10 MG tablet Take 1 tablet (10 mg total) by mouth daily. 30 tablet 5  . Pitavastatin Calcium (LIVALO) 4 MG TABS 1 po qd 30 tablet 2  . PROAIR HFA 108 (90 Base) MCG/ACT inhaler Inhale 2 puffs into the lungs every 4 (four) hours as needed for wheezing or shortness of breath. 1 Inhaler 3  . ranitidine (ZANTAC) 150 MG tablet Take 1 tablet (150 mg total) by mouth 2 (two) times daily. 60 tablet 0  . Risedronate Sodium 35 MG TBEC Take 35 mg by mouth once a week.    . Vitamin D, Ergocalciferol, (DRISDOL) 50000 units CAPS capsule Take 1 capsule (50,000 Units total) by mouth every 7 (seven) days. 4 capsule 1  . ciprofloxacin (CIPRO) 500 MG tablet Take 1 tablet (500 mg total) by mouth 2 (two) times daily. 6 tablet 0  . Lorcaserin HCl ER (BELVIQ XR) 20 MG TB24 Take 1 tablet  by mouth daily. (Patient not taking: Reported on 09/17/2016) 30 tablet 2   No facility-administered medications prior to visit.     Allergies  Allergen Reactions  . Belviq Xr [Lorcaserin Hcl Er] Itching    Caused her face to turn red.  [name] [Liraglutide -Weight Management] Other (See Comments)    Caused indigestion.  Marland Kitchen. Penicillins Rash    Review of Systems  Constitutional: Negative for fever.  HENT: Negative for congestion.   Eyes: Negative for blurred vision.  Respiratory: Negative for cough.   Cardiovascular: Negative for chest pain and palpitations.  Gastrointestinal: Negative for vomiting.  Musculoskeletal: Negative for back pain.  Skin: Negative for rash.  Neurological: Negative for loss of consciousness and headaches.  Endo/Heme/Allergies: Negative.        Objective:    Physical Exam  Constitutional: She is oriented to person, place, and time. She appears well-developed and  well-nourished. No distress.  HENT:  Head: Normocephalic and atraumatic.  Right Ear: External ear normal.  Left Ear: External ear normal.  Nose: Nose normal.  Mouth/Throat: Oropharynx is clear and moist.  Eyes: Conjunctivae and EOM are normal. Pupils are equal, round, and reactive to light.  Neck: Normal range of motion. Neck supple. No JVD present. Carotid bruit is not present. No thyromegaly present.  Cardiovascular: Normal rate, regular rhythm and normal heart sounds.   No murmur heard. Pulmonary/Chest: Effort normal and breath sounds normal. No respiratory distress. She has no wheezes. She has no rales. She exhibits no tenderness.  Musculoskeletal: She exhibits no edema.  Neurological: She is alert and oriented to person, place, and time.  Psychiatric: She has a normal mood and affect. Her behavior is normal. Judgment and thought content normal.  Nursing note and vitals reviewed.   BP 122/82 (BP Location: Left Arm, Patient Position: Sitting, Cuff Size: Normal)   Pulse 80   Temp 98.3 F (36.8 C) (Oral)   Resp 16   Ht 5\' 7"  (1.702 m)   Wt 185 lb 3.2 oz (84 kg)   SpO2 93%   BMI 29.01 kg/m  Wt Readings from Last 3 Encounters:  09/17/16 185 lb 3.2 oz (84 kg)  05/26/16 180 lb (81.6 kg)  04/08/16 183 lb 6.4 oz (83.2 kg)     Lab Results  Component Value Date   WBC 7.4 09/08/2015   HGB 15.4 (H) 09/08/2015   HCT 45.7 09/08/2015   PLT 249.0 09/08/2015   GLUCOSE 100 (H) 02/06/2016   CHOL 184 09/08/2015   TRIG 90.0 09/08/2015   HDL 50.80 09/08/2015   LDLCALC 115 (H) 09/08/2015   ALT 26 02/06/2016   AST 22 02/06/2016   NA 139 02/06/2016   K 4.1 02/06/2016   CL 103 02/06/2016   CREATININE 1.08 02/06/2016   BUN 17 02/06/2016   CO2 26 02/06/2016   TSH 1.81 02/06/2016    Lab Results  Component Value Date   TSH 1.81 02/06/2016   Lab Results  Component Value Date   WBC 7.4 09/08/2015   HGB 15.4 (H) 09/08/2015   HCT 45.7 09/08/2015   MCV 89.7 09/08/2015   PLT 249.0  09/08/2015   Lab Results  Component Value Date   NA 139 02/06/2016   K 4.1 02/06/2016   CO2 26 02/06/2016   GLUCOSE 100 (H) 02/06/2016   BUN 17 02/06/2016   CREATININE 1.08 02/06/2016   BILITOT 0.5 02/06/2016   ALKPHOS 81 02/06/2016   AST 22 02/06/2016   ALT 26 02/06/2016   PROT  7.3 02/06/2016   ALBUMIN 4.4 02/06/2016   CALCIUM 9.4 02/06/2016   GFR 54.85 (L) 02/06/2016   Lab Results  Component Value Date   CHOL 184 09/08/2015   Lab Results  Component Value Date   HDL 50.80 09/08/2015   Lab Results  Component Value Date   LDLCALC 115 (H) 09/08/2015   Lab Results  Component Value Date   TRIG 90.0 09/08/2015   Lab Results  Component Value Date   CHOLHDL 4 09/08/2015   No results found for: HGBA1C     Assessment & Plan:   Problem List Items Addressed This Visit      Unprioritized   Preventative health care - Primary    ghm utd Check labs See avs       Relevant Orders   Comprehensive metabolic panel   POCT Urinalysis Dipstick (Automated)   Lipid panel    Other Visit Diagnoses    Hypothyroidism, unspecified type       Relevant Orders   TSH+T4F+T3Free   Hyperlipidemia, unspecified hyperlipidemia type       Relevant Orders   NMR LipoProf noLp+Graph      I have discontinued Ms. Straka's ciprofloxacin. I am also having her maintain her ALPRAZolam, cyclobenzaprine, losartan, (FLUNISOLIDE, NASAL, NA), montelukast, PROAIR HFA, conjugated estrogens, Vitamin D (Ergocalciferol), Liraglutide -Weight Management, Risedronate Sodium, Lorcaserin HCl ER, flunisolide, ranitidine, Pitavastatin Calcium, and levothyroxine.  No orders of the defined types were placed in this encounter.   CMA served as Neurosurgeon during this visit. History, Physical and Plan performed by medical provider. Documentation and orders reviewed and attested to.   Donato Schultz, DO

## 2016-09-17 NOTE — Progress Notes (Signed)
Pre visit review using our clinic review tool, if applicable. No additional management support is needed unless otherwise documented below in the visit note. 

## 2016-09-18 DIAGNOSIS — Z719 Counseling, unspecified: Secondary | ICD-10-CM | POA: Insufficient documentation

## 2016-09-18 DIAGNOSIS — Z0000 Encounter for general adult medical examination without abnormal findings: Secondary | ICD-10-CM | POA: Insufficient documentation

## 2016-09-18 HISTORY — DX: Counseling, unspecified: Z71.9

## 2016-09-18 NOTE — Assessment & Plan Note (Signed)
ghm utd Check labs  See avs  

## 2016-09-19 ENCOUNTER — Other Ambulatory Visit: Payer: Managed Care, Other (non HMO)

## 2016-09-23 ENCOUNTER — Telehealth: Payer: Self-pay | Admitting: Family Medicine

## 2016-09-23 MED ORDER — LOSARTAN POTASSIUM 100 MG PO TABS: 100.0000 mg | ORAL_TABLET | Freq: Every day | ORAL | 5 refills | Status: DC

## 2016-09-23 NOTE — Telephone Encounter (Signed)
rx sent patient notified   pc

## 2016-09-23 NOTE — Telephone Encounter (Signed)
Caller name:Maelee Tisdel Relationship to patient: Can be [phone] [facility]  Reason for call: Needs refill on BP meds, Losartan 100mg . Request call once sent, out of meds.

## 2016-09-24 ENCOUNTER — Telehealth: Payer: Self-pay | Admitting: Family Medicine

## 2016-09-24 ENCOUNTER — Other Ambulatory Visit (INDEPENDENT_AMBULATORY_CARE_PROVIDER_SITE_OTHER): Payer: Managed Care, Other (non HMO)

## 2016-09-24 DIAGNOSIS — Z0000 Encounter for general adult medical examination without abnormal findings: Secondary | ICD-10-CM

## 2016-09-24 DIAGNOSIS — E039 Hypothyroidism, unspecified: Secondary | ICD-10-CM

## 2016-09-24 LAB — POC URINALSYSI DIPSTICK (AUTOMATED)
BILIRUBIN UA: NEGATIVE
GLUCOSE UA: NEGATIVE
Ketones, UA: NEGATIVE
Leukocytes, UA: NEGATIVE
Nitrite, UA: NEGATIVE
Protein, UA: NEGATIVE
RBC UA: NEGATIVE
SPEC GRAV UA: 1.02
UROBILINOGEN UA: NEGATIVE
pH, UA: 6

## 2016-09-24 LAB — LIPID PANEL
CHOL/HDL RATIO: 3
CHOLESTEROL: 150 mg/dL (ref 0–200)
HDL: 49.7 mg/dL (ref >39.00)
LDL CALC: 78 mg/dL (ref 0–99)
NonHDL: 100.76
Triglycerides: 114 mg/dL (ref 0.0–149.0)
VLDL: 22.8 mg/dL (ref 0.0–40.0)

## 2016-09-24 LAB — COMPREHENSIVE METABOLIC PANEL
ALK PHOS: 76 U/L (ref 39–117)
ALT: 22 U/L (ref 0–35)
AST: 21 U/L (ref 0–37)
Albumin: 4.3 g/dL (ref 3.5–5.2)
BILIRUBIN TOTAL: 0.6 mg/dL (ref 0.2–1.2)
BUN: 11 mg/dL (ref 6–23)
CALCIUM: 9.3 mg/dL (ref 8.4–10.5)
CO2: 30 meq/L (ref 19–32)
CREATININE: 0.93 mg/dL (ref 0.40–1.20)
Chloride: 106 mEq/L (ref 96–112)
GFR: 65.05 mL/min (ref >60.00)
Glucose, Bld: 101 mg/dL — ABNORMAL HIGH (ref 70–99)
Potassium: 3.8 mEq/L (ref 3.5–5.1)
Sodium: 140 mEq/L (ref 135–145)
TOTAL PROTEIN: 7 g/dL (ref 6.0–8.3)

## 2016-09-24 NOTE — Telephone Encounter (Signed)
Pt dropped off document to be filled out by PCP (Wellness Form- with envelope). Pt would like document mailed out. Document put at front office tray.

## 2016-09-25 LAB — TSH+T4F+T3FREE
FREE T4: 1.33 ng/dL (ref 0.82–1.77)
T3, Free: 3.3 pg/mL (ref 2.0–4.4)
TSH: 1.26 u[IU]/mL (ref 0.450–4.500)

## 2016-09-26 NOTE — Telephone Encounter (Signed)
Form received and forwarded to PCP for review and signature. 

## 2016-10-21 ENCOUNTER — Other Ambulatory Visit: Payer: Self-pay | Admitting: Family Medicine

## 2016-10-21 DIAGNOSIS — E785 Hyperlipidemia, unspecified: Secondary | ICD-10-CM

## 2016-10-21 MED ORDER — PITAVASTATIN CALCIUM 4 MG PO TABS: ORAL_TABLET | ORAL | 5 refills | Status: DC

## 2016-11-11 ENCOUNTER — Telehealth: Payer: Self-pay | Admitting: Family Medicine

## 2016-11-11 NOTE — Telephone Encounter (Signed)
Caller name: Relationship to patient: Self Can be reached: [phone] Pharmacy:  Reason for call: Request a call back from nurse to discuss medication for muscle spasams. Plse adv

## 2016-11-12 ENCOUNTER — Telehealth: Payer: Self-pay | Admitting: Family Medicine

## 2016-11-12 NOTE — Telephone Encounter (Signed)
Spoke with Kathryn Boyer she stated that she continues to have back pain. She stated that she is taking Advil every 4 hours and it is not working. Patient would like to know if she can have a muscle relaxer called in to her pharmacy.

## 2016-11-12 NOTE — Telephone Encounter (Signed)
See 11/11/16 phone note.

## 2016-11-12 NOTE — Telephone Encounter (Signed)
Have we seen her for this--- I can't find ov note

## 2016-11-12 NOTE — Telephone Encounter (Signed)
[name] [name] 3 hours ago (2:01 PM)      Spoke with Anuradha she stated that she continues to have back pain. She stated that she is taking Advil every 4 hours and it is not working. Patient would like to know if she can have a muscle relaxer called in to her pharmacy.

## 2016-11-13 NOTE — Telephone Encounter (Signed)
[facility]

## 2016-11-13 NOTE — Telephone Encounter (Signed)
Spoke with pt. She states she completed a "teledoc" visit for her back pain on Sunday and was prescribed diclofenac 10mg , 1 tablet every 8 hours. States she took 1 and it did not help. She reports that pain is some better today. Was taking 3 advil every 6 hours, applying aspercream heat and using heating pad which seemed to relieve the intensity of her symptoms. Pt is agreeable to office visit and scheduled appt for 11/14/16 at 9am.

## 2016-11-14 ENCOUNTER — Ambulatory Visit: Payer: Managed Care, Other (non HMO) | Admitting: Family Medicine

## 2016-11-14 ENCOUNTER — Telehealth: Payer: Self-pay | Admitting: Family Medicine

## 2016-11-14 NOTE — Telephone Encounter (Signed)
Caller name:Ezmae Grater Relationship to patient: Can be reached: Pharmacy:  Reason for call:Unable to make appointment today, hospice has called her regarding her mother in law and she has to go over there. Patient did schedule appointment for tomorrow. Charge NOS fee?

## 2016-11-14 NOTE — Telephone Encounter (Signed)
No charge and block please

## 2016-11-15 ENCOUNTER — Ambulatory Visit (INDEPENDENT_AMBULATORY_CARE_PROVIDER_SITE_OTHER): Payer: Managed Care, Other (non HMO) | Admitting: Family Medicine

## 2016-11-15 ENCOUNTER — Encounter: Payer: Self-pay | Admitting: Family Medicine

## 2016-11-15 VITALS — BP 129/77 | HR 82 | Temp 97.8°F | Resp 16 | Ht 67.0 in | Wt 184.2 lb

## 2016-11-15 DIAGNOSIS — M62838 Other muscle spasm: Secondary | ICD-10-CM | POA: Diagnosis not present

## 2016-11-15 MED ORDER — CYCLOBENZAPRINE HCL 10 MG PO TABS: 10.0000 mg | ORAL_TABLET | Freq: Three times a day (TID) | ORAL | 1 refills | Status: DC | PRN

## 2016-11-15 MED ORDER — MELOXICAM 15 MG PO TABS: ORAL_TABLET | ORAL | 1 refills | Status: DC

## 2016-11-15 NOTE — Progress Notes (Signed)
Pre visit review using our clinic review tool, if applicable. No additional management support is needed unless otherwise documented below in the visit note. 

## 2016-11-15 NOTE — Progress Notes (Signed)
Patient ID: Kathryn Boyer, female   DOB: 09-24-1955, 61 y.o.   MRN: [id number]     Subjective:  I acted as a Neurosurgeonscribe for Dr. Zola ButtonLowne-Chase.  Apolonio SchneidersSheketia, CMA   Patient ID: Kathryn Boyer, female    DOB: 09-24-1955, 61 y.o.   MRN: [id number]  Chief Complaint  Patient presents with  . Back Pain    left side shoulder blade.      HPI  Patient is in today for left side back shoulder pain. No injuries that she can remember.  She has a history of bad osteoporosis.  Took one pill of Baclofen and felt like it did not work so she did not continue.    Patient Care Team: Donato SchultzYvonne R Lowne Chase, DO as PCP - General (Family Medicine) Blondell Revealichard Fletcher V, MD (Obstetrics and Gynecology) Kathrynn RunningMary D. Lanae BoastShearin, MD as Referring Physician (Gastroenterology) Fletcher AnonJose A Bardelas, MD as Consulting Physician (Allergy and Immunology) Althea CharonMargaret Lins Bertrand, MD (Radiology)   Past Medical History:  Diagnosis Date  . Anxiety   . Hyperlipidemia   . Hypertension   . Plantar fasciitis   . Thyroid disease     Past Surgical History:  Procedure Laterality Date  . ABDOMINAL HYSTERECTOMY    . BREAST SURGERY    . NOSE SURGERY      Family History  Problem Relation Age of Onset  . Heart disease Mother   . Stroke Mother   . Hypertension Mother   . Cancer Mother     ovarian  . Aneurysm Father   . Hypertension Father   . Heart disease Father   . Kidney disease Father   . Heart disease Sister     Social History   Social History  . Marital status: Married    Spouse name: N/A  . Number of children: N/A  . Years of education: N/A   Occupational History  . Not on file.   Social History Main Topics  . Smoking status: Never Smoker  . Smokeless tobacco: Never Used  . Alcohol use No  . Drug use: No  . Sexual activity: Not on file   Other Topics Concern  . Not on file   Social History Narrative  . No narrative on file    Outpatient Medications Prior to Visit  Medication Sig Dispense Refill  . ALPRAZolam  (XANAX) 0.25 MG tablet Take 1 tablet (0.25 mg total) by mouth at bedtime as needed for anxiety. 30 tablet 0  . flunisolide (NASALIDE) 25 MCG/ACT (0.025%) SOLN Place 2 sprays into the nose 2 (two) times daily. 1 Bottle 5  . levothyroxine (SYNTHROID, LEVOTHROID) 100 MCG tablet Take 1 tablet (100 mcg total) by mouth daily before breakfast. 30 tablet 5  . losartan (COZAAR) 100 MG tablet Take 1 tablet (100 mg total) by mouth daily. 30 tablet 5  . montelukast (SINGULAIR) 10 MG tablet Take 1 tablet (10 mg total) by mouth daily. 30 tablet 5  . Pitavastatin Calcium (LIVALO) 4 MG TABS 1 po qd 30 tablet 5  . Risedronate Sodium 35 MG TBEC Take 35 mg by mouth once a week.    . Vitamin D, Ergocalciferol, (DRISDOL) 50000 units CAPS capsule Take 1 capsule (50,000 Units total) by mouth every 7 (seven) days. 4 capsule 1  . cyclobenzaprine (FLEXERIL) 10 MG tablet Take 1 tablet (10 mg total) by mouth 3 (three) times daily as needed for muscle spasms. 30 tablet 0  . conjugated estrogens (PREMARIN) vaginal cream Place 1 Applicatorful vaginally daily. 42.5 g 12  .  FLUNISOLIDE, NASAL, NA Place into the nose.    . Liraglutide -Weight Management (SAXENDA) 18 MG/3ML SOPN Inject 3 mg into the skin daily. 5 pen 5  . Lorcaserin HCl ER (BELVIQ XR) 20 MG TB24 Take 1 tablet by mouth daily. (Patient not taking: Reported on 09/17/2016) 30 tablet 2  . PROAIR HFA 108 (90 Base) MCG/ACT inhaler Inhale 2 puffs into the lungs every 4 (four) hours as needed for wheezing or shortness of breath. 1 Inhaler 3  . ranitidine (ZANTAC) 150 MG tablet Take 1 tablet (150 mg total) by mouth 2 (two) times daily. 60 tablet 0   No facility-administered medications prior to visit.     Allergies  Allergen Reactions  . Belviq Xr [Lorcaserin Hcl Er] Itching    Caused her face to turn red.  Bernie Covey [Liraglutide -Weight Management] Other (See Comments)    Caused indigestion.  Marland Kitchen Penicillins Rash    Review of Systems  Constitutional: Negative for  chills, fever and malaise/fatigue.  HENT: Negative for congestion and hearing loss.   Eyes: Negative for discharge.  Respiratory: Negative for cough, sputum production and shortness of breath.   Cardiovascular: Negative for chest pain, palpitations and leg swelling.  Gastrointestinal: Negative for abdominal pain, blood in stool, constipation, diarrhea, heartburn, nausea and vomiting.  Genitourinary: Negative for dysuria, frequency, hematuria and urgency.  Musculoskeletal: Positive for back pain. Negative for falls and myalgias.  Skin: Negative for rash.  Neurological: Negative for dizziness, sensory change, loss of consciousness, weakness and headaches.  Endo/Heme/Allergies: Negative for environmental allergies. Does not bruise/bleed easily.  Psychiatric/Behavioral: Negative for depression and suicidal ideas. The patient is not nervous/anxious and does not have insomnia.        Objective:    Physical Exam  Musculoskeletal:       Arms: Nursing note and vitals reviewed.   BP 129/77 (BP Location: Right Arm, Cuff Size: Normal)   Pulse 82   Temp 97.8 F (36.6 C) (Oral)   Resp 16   Ht 5\' 7"  (1.702 m)   Wt 184 lb 3.2 oz (83.6 kg)   SpO2 95%   BMI 28.85 kg/m  Wt Readings from Last 3 Encounters:  11/15/16 184 lb 3.2 oz (83.6 kg)  09/17/16 185 lb 3.2 oz (84 kg)  05/26/16 180 lb (81.6 kg)   BP Readings from Last 3 Encounters:  11/15/16 129/77  09/17/16 122/82  05/26/16 129/83     Immunization History  Administered Date(s) Administered  . Influenza,inj,Quad PF,36+ Mos 05/26/2015, 07/08/2016  . Tdap 09/08/2015    Health Maintenance  Topic Date Due  . HIV Screening  04/14/1970  . MAMMOGRAM  10/10/2018  . COLONOSCOPY  06/05/2025  . TETANUS/TDAP  09/07/2025  . INFLUENZA VACCINE  Completed  . Hepatitis C Screening  Completed    Lab Results  Component Value Date   WBC 7.4 09/08/2015   HGB 15.4 (H) 09/08/2015   HCT 45.7 09/08/2015   PLT 249.0 09/08/2015   GLUCOSE 101 (H)  09/24/2016   CHOL 150 09/24/2016   TRIG 114.0 09/24/2016   HDL 49.70 09/24/2016   LDLCALC 78 09/24/2016   ALT 22 09/24/2016   AST 21 09/24/2016   NA 140 09/24/2016   K 3.8 09/24/2016   CL 106 09/24/2016   CREATININE 0.93 09/24/2016   BUN 11 09/24/2016   CO2 30 09/24/2016   TSH 1.260 09/24/2016    Lab Results  Component Value Date   TSH 1.260 09/24/2016   Lab Results  Component Value  Date   WBC 7.4 09/08/2015   HGB 15.4 (H) 09/08/2015   HCT 45.7 09/08/2015   MCV 89.7 09/08/2015   PLT 249.0 09/08/2015   Lab Results  Component Value Date   NA 140 09/24/2016   K 3.8 09/24/2016   CO2 30 09/24/2016   GLUCOSE 101 (H) 09/24/2016   BUN 11 09/24/2016   CREATININE 0.93 09/24/2016   BILITOT 0.6 09/24/2016   ALKPHOS 76 09/24/2016   AST 21 09/24/2016   ALT 22 09/24/2016   PROT 7.0 09/24/2016   ALBUMIN 4.3 09/24/2016   CALCIUM 9.3 09/24/2016   GFR 65.05 09/24/2016   Lab Results  Component Value Date   CHOL 150 09/24/2016   Lab Results  Component Value Date   HDL 49.70 09/24/2016   Lab Results  Component Value Date   LDLCALC 78 09/24/2016   Lab Results  Component Value Date   TRIG 114.0 09/24/2016   Lab Results  Component Value Date   CHOLHDL 3 09/24/2016   No results found for: HGBA1C       Assessment & Plan:   Problem List Items Addressed This Visit    None    Visit Diagnoses    Muscle spasm    -  Primary   Relevant Medications   cyclobenzaprine (FLEXERIL) 10 MG tablet   meloxicam (MOBIC) 15 MG tablet      I have discontinued Ms. Charters's (FLUNISOLIDE, NASAL, NA), PROAIR HFA, conjugated estrogens, Liraglutide -Weight Management, Lorcaserin HCl ER, and ranitidine. I am also having her start on meloxicam. Additionally, I am having her maintain her ALPRAZolam, montelukast, Vitamin D (Ergocalciferol), Risedronate Sodium, flunisolide, levothyroxine, losartan, Pitavastatin Calcium, and cyclobenzaprine.  Meds ordered this encounter  Medications  .  cyclobenzaprine (FLEXERIL) 10 MG tablet    Sig: Take 1 tablet (10 mg total) by mouth 3 (three) times daily as needed for muscle spasms.    Dispense:  30 tablet    Refill:  1  . meloxicam (MOBIC) 15 MG tablet    Sig: Take 1/2 to 1 tablet by mouth once a day    Dispense:  30 tablet    Refill:  1    CMA served as scribe during this visit. History, Physical and Plan performed by medical provider. Documentation and orders reviewed and attested to.  Donato Schultz, DO

## 2016-11-15 NOTE — Patient Instructions (Signed)
Thoracic Strain A thoracic strain, which is sometimes called a mid-back strain, is an injury to the muscles or tendons that attach to the upper part of your back behind your chest. This type of injury occurs when a muscle is overstretched or overloaded. Thoracic strains can range from mild to severe. Mild strains may involve stretching a muscle or tendon without tearing it. These injuries may heal in 1-2 weeks. More severe strains involve tearing of muscle fibers or tendons. These will cause more pain and may take 6-8 weeks to heal. What are the causes? This condition may be caused by:  An injury in which a sudden force is placed on the muscle.  Exercising without properly warming up.  Overuse of the muscle.  Improper form during certain movements.  Other injuries that surround or cause stress on the mid-back, causing a strain on the muscles. In some cases, the cause may not be known. What increases the risk? This injury is more common in:  Athletes.  People with obesity. What are the signs or symptoms? The main symptom of this condition is pain, especially with movement. Other symptoms include:  Bruising.  Swelling.  Spasm. How is this diagnosed? This condition may be diagnosed with a physical exam. X-rays may be taken to check for a fracture. How is this treated? This condition may be treated with:  Resting and icing the injured area.  Physical therapy. This will involve doing stretching and strengthening exercises.  Medicines for pain and inflammation. Follow these instructions at home:  Rest as needed. Follow instructions from your health care provider about any restrictions on activity.  If directed, apply ice to the injured area:  Put ice in a plastic bag.  Place a towel between your skin and the bag.  Leave the ice on for 20 minutes, 2-3 times per day.  Take over-the-counter and prescription medicines only as told by your health care provider.  Begin doing  exercises as told by your health care provider or physical therapist.  Always warm up properly before physical activity or sports.  Bend your knees before you lift heavy objects.  Keep all follow-up visits as told by your health care provider. This is important. Contact a health care provider if:  Your pain is not helped by medicine.  Your pain, bruising, or swelling is getting worse.  You have a fever. Get help right away if:  You have shortness of breath.  You have chest pain.  You develop numbness or weakness in your legs.  You have involuntary loss of urine (urinary incontinence). This information is not intended to replace advice given to you by your health care provider. Make sure you discuss any questions you have with your health care provider. Document Released: 11/02/2003 Document Revised: 04/13/2016 Document Reviewed: 10/06/2014 Elsevier Interactive Patient Education  2017 Elsevier Inc.  

## 2016-11-21 ENCOUNTER — Telehealth: Payer: Self-pay | Admitting: Family Medicine

## 2016-11-21 DIAGNOSIS — E785 Hyperlipidemia, unspecified: Secondary | ICD-10-CM

## 2016-11-21 MED ORDER — PITAVASTATIN CALCIUM 4 MG PO TABS: 1.0000 | ORAL_TABLET | Freq: Every day | ORAL | 3 refills | Status: DC

## 2016-11-21 NOTE — Telephone Encounter (Signed)
Spoke to the patient and sent in #[facility] [place].

## 2016-11-21 NOTE — Telephone Encounter (Signed)
Patient came into the office and she needs her medications sent for 90 days from now on due to insurance changes its too expensive to do on 30 days. She is out of all medications ashe needs rthem called [facility] to [facility]  [phone]

## 2016-12-19 ENCOUNTER — Telehealth: Payer: Self-pay | Admitting: Family Medicine

## 2016-12-19 ENCOUNTER — Ambulatory Visit: Payer: Managed Care, Other (non HMO) | Admitting: Family Medicine

## 2016-12-19 NOTE — Telephone Encounter (Signed)
No charge. 

## 2016-12-19 NOTE — Telephone Encounter (Signed)
Patient cancelled 1:30pm appointment today due to transportation and [facility] to 12/20/16 with PCP, charge or no charge

## 2016-12-20 ENCOUNTER — Ambulatory Visit (INDEPENDENT_AMBULATORY_CARE_PROVIDER_SITE_OTHER): Payer: Managed Care, Other (non HMO) | Admitting: Family Medicine

## 2016-12-20 ENCOUNTER — Ambulatory Visit (HOSPITAL_BASED_OUTPATIENT_CLINIC_OR_DEPARTMENT_OTHER)
Admission: RE | Admit: 2016-12-20 | Discharge: 2016-12-20 | Disposition: A | Discharge status: Home / Self Care | Payer: Managed Care, Other (non HMO) | Source: Ambulatory Visit | Attending: Family Medicine | Admitting: Family Medicine

## 2016-12-20 ENCOUNTER — Encounter: Payer: Self-pay | Admitting: Family Medicine

## 2016-12-20 VITALS — BP 118/78 | HR 69 | Temp 98.2°F | Resp 16 | Ht 67.0 in | Wt 182.6 lb

## 2016-12-20 DIAGNOSIS — M810 Age-related osteoporosis without current pathological fracture: Secondary | ICD-10-CM

## 2016-12-20 DIAGNOSIS — M25551 Pain in right hip: Secondary | ICD-10-CM | POA: Insufficient documentation

## 2016-12-20 DIAGNOSIS — M25552 Pain in left hip: Secondary | ICD-10-CM | POA: Insufficient documentation

## 2016-12-20 HISTORY — DX: Age-related osteoporosis without current pathological fracture: M81.0

## 2016-12-20 MED ORDER — VITAMIN D (ERGOCALCIFEROL) 1.25 MG (50000 UNIT) PO CAPS: 50000.0000 [IU] | ORAL_CAPSULE | ORAL | 1 refills | Status: DC

## 2016-12-20 NOTE — Patient Instructions (Signed)
Back Pain, Adult  Back pain is very common. The pain often gets better over time. The cause of back pain is usually not dangerous. Most people can learn to manage their back pain on their own.  Follow these instructions at home:  Watch your back pain for any changes. The following actions may help to lessen any pain you are feeling:  · Stay active. Start with short walks on flat ground if you can. Try to walk farther each day.  · Exercise regularly as told by your doctor. Exercise helps your back heal faster. It also helps avoid future injury by keeping your muscles strong and flexible.  · Do not sit, drive, or stand in one place for more than 30 minutes.  · Do not stay in bed. Resting more than 1-2 days can slow down your recovery.  · Be careful when you bend or lift an object. Use good form when lifting:  ? Bend at your knees.  ? Keep the object close to your body.  ? Do not twist.  · Sleep on a firm mattress. Lie on your side, and bend your knees. If you lie on your back, put a pillow under your knees.  · Take medicines only as told by your doctor.  · Put ice on the injured area.  ? Put ice in a plastic bag.  ? Place a towel between your skin and the bag.  ? Leave the ice on for 20 minutes, 2-3 times a day for the first 2-3 days. After that, you can switch between ice and heat packs.  · Avoid feeling anxious or stressed. Find good ways to deal with stress, such as exercise.  · Maintain a healthy weight. Extra weight puts stress on your back.    Contact a doctor if:  · You have pain that does not go away with rest or medicine.  · You have worsening pain that goes down into your legs or buttocks.  · You have pain that does not get better in one week.  · You have pain at night.  · You lose weight.  · You have a fever or chills.  Get help right away if:  · You cannot control when you poop (bowel movement) or pee (urinate).  · Your arms or legs feel weak.  · Your arms or legs lose feeling (numbness).  · You feel sick  to your stomach (nauseous) or throw up (vomit).  · You have belly (abdominal) pain.  · You feel like you may pass out (faint).  This information is not intended to replace advice given to you by your health care provider. Make sure you discuss any questions you have with your health care provider.  Document Released: 01/29/2008 Document Revised: 01/18/2016 Document Reviewed: 12/14/2013  Elsevier Interactive Patient Education © 2017 Elsevier Inc.

## 2016-12-20 NOTE — Progress Notes (Signed)
Pre visit review using our clinic review tool, if applicable. No additional management support is needed unless otherwise documented below in the visit note. 

## 2016-12-20 NOTE — Progress Notes (Signed)
0  Subjective:  I acted as a Neurosurgeon for Dr. Delman Kitten, LPN    Patient ID: Kathryn Boyer, female    DOB: 1955/12/28, 61 y.o.   MRN: [id number]  Chief Complaint  Patient presents with  . Back Pain  . Epistaxis    HPI  Patient is in today for c/o back pain. Pt report she has been taking Mobic and after taking this medication she developed nose bleeds twice daily. Pt report she decreased Mobic and her last nose bleed episode was Saturday. She also c/o hip pain and aching and wonders if it could be the actonel .  She started it 3 months ago.  She was told by her gyn about 3 months ago she had a possible hip fracture on xray but nothing was done.    Patient Care Team: Donato Schultz, DO as PCP - General (Family Medicine) Blondell Reveal, MD (Obstetrics and Gynecology) Kathrynn Running. Lanae Boast, MD as Referring Physician (Gastroenterology) Fletcher Anon, MD as Consulting Physician (Allergy and Immunology) Althea Charon, MD (Radiology)   Past Medical History:  Diagnosis Date  . Anxiety   . Hyperlipidemia   . Hypertension   . Plantar fasciitis   . Thyroid disease     Past Surgical History:  Procedure Laterality Date  . ABDOMINAL HYSTERECTOMY    . BREAST SURGERY    . NOSE SURGERY      Family History  Problem Relation Age of Onset  . Heart disease Mother   . Stroke Mother   . Hypertension Mother   . Cancer Mother     ovarian  . Aneurysm Father   . Hypertension Father   . Heart disease Father   . Kidney disease Father   . Heart disease Sister     Social History   Social History  . Marital status: Married    Spouse name: N/A  . Number of children: N/A  . Years of education: N/A   Occupational History  . Not on file.   Social History Main Topics  . Smoking status: Never Smoker  . Smokeless tobacco: Never Used  . Alcohol use No  . Drug use: No  . Sexual activity: Not on file   Other Topics Concern  . Not on file   Social History Narrative  .  No narrative on file    Outpatient Medications Prior to Visit  Medication Sig Dispense Refill  . ALPRAZolam (XANAX) 0.25 MG tablet Take 1 tablet (0.25 mg total) by mouth at bedtime as needed for anxiety. 30 tablet 0  . cyclobenzaprine (FLEXERIL) 10 MG tablet Take 1 tablet (10 mg total) by mouth 3 (three) times daily as needed for muscle spasms. 30 tablet 1  . flunisolide (NASALIDE) 25 MCG/ACT (0.025%) SOLN Place 2 sprays into the nose 2 (two) times daily. 1 Bottle 5  . levothyroxine (SYNTHROID, LEVOTHROID) 100 MCG tablet Take 1 tablet (100 mcg total) by mouth daily before breakfast. 30 tablet 5  . losartan (COZAAR) 100 MG tablet Take 1 tablet (100 mg total) by mouth daily. 30 tablet 5  . meloxicam (MOBIC) 15 MG tablet Take 1/2 to 1 tablet by mouth once a day 30 tablet 1  . montelukast (SINGULAIR) 10 MG tablet Take 1 tablet (10 mg total) by mouth daily. 30 tablet 5  . Pitavastatin Calcium (LIVALO) 4 MG TABS Take 1 tablet (4 mg total) by mouth daily. 90 tablet 3  . Risedronate Sodium 35 MG TBEC Take 35 mg by  mouth once a week.    . Vitamin D, Ergocalciferol, (DRISDOL) 50000 units CAPS capsule Take 1 capsule (50,000 Units total) by mouth every 7 (seven) days. 4 capsule 1   No facility-administered medications prior to visit.     Allergies  Allergen Reactions  . Belviq Xr [Lorcaserin Hcl Er] Itching    Caused her face to turn red.  Bernie Covey [Liraglutide -Weight Management] Other (See Comments)    Caused indigestion.  Marland Kitchen Penicillins Rash    Review of Systems  Constitutional: Negative for fever and malaise/fatigue.  HENT: Negative for congestion.   Eyes: Negative for blurred vision.  Respiratory: Negative for cough and shortness of breath.   Cardiovascular: Negative for chest pain, palpitations and leg swelling.  Gastrointestinal: Negative for vomiting.  Musculoskeletal: Negative for back pain.  Skin: Negative for rash.  Neurological: Negative for loss of consciousness and headaches.         Objective:    Physical Exam  Constitutional: She is oriented to person, place, and time. She appears well-developed and well-nourished.  HENT:  Head: Normocephalic and atraumatic.  Eyes: Conjunctivae and EOM are normal.  Neck: Normal range of motion. Neck supple. No JVD present. Carotid bruit is not present. No thyromegaly present.  Cardiovascular: Normal rate, regular rhythm and normal heart sounds.   No murmur heard. Pulmonary/Chest: Effort normal and breath sounds normal. No respiratory distress. She has no wheezes. She has no rales. She exhibits no tenderness.  Musculoskeletal: Normal range of motion. She exhibits tenderness. She exhibits no edema or deformity.  Neurological: She is alert and oriented to person, place, and time. She has normal reflexes. She displays normal reflexes. She exhibits normal muscle tone.  Psychiatric: She has a normal mood and affect. Her behavior is normal. Judgment and thought content normal.  Nursing note and vitals reviewed.   BP 118/78 (BP Location: Left Arm, Patient Position: Sitting, Cuff Size: Normal)   Pulse 69   Temp 98.2 F (36.8 C) (Oral)   Resp 16   Ht  (1.702 m)   Wt 182 lb 9.6 oz (82.8 kg)   SpO2 98%   BMI 28.60 kg/m  Wt Readings from Last 3 Encounters:  12/20/16 182 lb 9.6 oz (82.8 kg)  11/15/16 184 lb 3.2 oz (83.6 kg)  09/17/16 185 lb 3.2 oz (84 kg)   BP Readings from Last 3 Encounters:  12/20/16 118/78  11/15/16 129/77  09/17/16 122/82     Immunization History  Administered Date(s) Administered  . Influenza,inj,Quad PF,36+ Mos 05/26/2015, 07/08/2016  . Tdap 09/08/2015    Health Maintenance  Topic Date Due  . HIV Screening  04/14/1970  . INFLUENZA VACCINE  03/26/2017  . MAMMOGRAM  10/10/2018  . COLONOSCOPY  06/05/2025  . TETANUS/TDAP  09/07/2025  . Hepatitis C Screening  Completed    Lab Results  Component Value Date   WBC 7.4 09/08/2015   HGB 15.4 (H) 09/08/2015   HCT 45.7 09/08/2015   PLT 249.0  09/08/2015   GLUCOSE 101 (H) 09/24/2016   CHOL 150 09/24/2016   TRIG 114.0 09/24/2016   HDL 49.70 09/24/2016   LDLCALC 78 09/24/2016   ALT 22 09/24/2016   AST 21 09/24/2016   NA 140 09/24/2016   K 3.8 09/24/2016   CL 106 09/24/2016   CREATININE 0.93 09/24/2016   BUN 11 09/24/2016   CO2 30 09/24/2016   TSH 1.260 09/24/2016    Lab Results  Component Value Date   TSH 1.260 09/24/2016  Lab Results  Component Value Date   WBC 7.4 09/08/2015   HGB 15.4 (H) 09/08/2015   HCT 45.7 09/08/2015   MCV 89.7 09/08/2015   PLT 249.0 09/08/2015   Lab Results  Component Value Date   NA 140 09/24/2016   K 3.8 09/24/2016   CO2 30 09/24/2016   GLUCOSE 101 (H) 09/24/2016   BUN 11 09/24/2016   CREATININE 0.93 09/24/2016   BILITOT 0.6 09/24/2016   ALKPHOS 76 09/24/2016   AST 21 09/24/2016   ALT 22 09/24/2016   PROT 7.0 09/24/2016   ALBUMIN 4.3 09/24/2016   CALCIUM 9.3 09/24/2016   GFR 65.05 09/24/2016   Lab Results  Component Value Date   CHOL 150 09/24/2016   Lab Results  Component Value Date   HDL 49.70 09/24/2016   Lab Results  Component Value Date   LDLCALC 78 09/24/2016   Lab Results  Component Value Date   TRIG 114.0 09/24/2016   Lab Results  Component Value Date   CHOLHDL 3 09/24/2016   No results found for: HGBA1C       Assessment & Plan:   Problem List Items Addressed This Visit      Unprioritized   Osteoporosis   Relevant Medications   Vitamin D, Ergocalciferol, (DRISDOL) 50000 units CAPS capsule   Other Relevant Orders   DG HIPS BILAT W OR W/O PELVIS 2V (Completed)    Other Visit Diagnoses    Hip pain, bilateral    -  Primary   Relevant Orders   DG HIPS BILAT W OR W/O PELVIS 2V (Completed)      I am having Ms. Kohli maintain her ALPRAZolam, montelukast, Risedronate Sodium, flunisolide, levothyroxine, losartan, cyclobenzaprine, meloxicam, Pitavastatin Calcium, and Vitamin D (Ergocalciferol).  Meds ordered this encounter  Medications  .  Vitamin D, Ergocalciferol, (DRISDOL) 50000 units CAPS capsule    Sig: Take 1 capsule (50,000 Units total) by mouth every 7 (seven) days.    Dispense:  4 capsule    Refill:  1    CMA served as scribe during this visit. History, Physical and Plan performed by medical provider. Documentation and orders reviewed and attested to.  Donato Schultz, DO   Patient ID: Kathryn Boyer, female   DOB: [birth year], 61 y.o.   MRN: [id number]

## 2016-12-23 ENCOUNTER — Encounter: Payer: Self-pay | Admitting: Family Medicine

## 2016-12-23 ENCOUNTER — Other Ambulatory Visit: Payer: Self-pay | Admitting: Family Medicine

## 2016-12-23 DIAGNOSIS — M549 Dorsalgia, unspecified: Secondary | ICD-10-CM

## 2016-12-23 NOTE — Telephone Encounter (Signed)
Refer to ortho for back pain. 

## 2016-12-30 ENCOUNTER — Telehealth: Payer: Self-pay | Admitting: *Deleted

## 2016-12-30 NOTE — Telephone Encounter (Signed)
Spoke with patient about bone density from 2016.  She stated that she just started on actonel and will recheck in Aug.  She will try those first.

## 2017-02-12 ENCOUNTER — Other Ambulatory Visit: Payer: Self-pay | Admitting: Family Medicine

## 2017-02-12 DIAGNOSIS — M62838 Other muscle spasm: Secondary | ICD-10-CM

## 2017-02-15 ENCOUNTER — Other Ambulatory Visit: Payer: Self-pay | Admitting: Family Medicine

## 2017-02-17 ENCOUNTER — Other Ambulatory Visit: Payer: Self-pay | Admitting: Family Medicine

## 2017-02-18 NOTE — Telephone Encounter (Signed)
Last lab for Vit D 05/29/15.  Would you like to do labs first before refilling.

## 2017-02-19 ENCOUNTER — Telehealth: Payer: Self-pay | Admitting: Family Medicine

## 2017-02-19 DIAGNOSIS — E785 Hyperlipidemia, unspecified: Secondary | ICD-10-CM

## 2017-02-19 NOTE — Telephone Encounter (Addendum)
Relation to ZO:XWRUpt:self Call back number:[phone] [facility]: [id number] [place] - [address] [phone]) [phone] (Fax)     Reason for call:  Patient called extremely frustrated stating pharmacy has been requesting 90 day supply for all medications and 30 day supply Rx have been sent, patient would like new Rx for all medications reflecting 90 day supply and would like to speak with nurse, please advise

## 2017-02-21 MED ORDER — LOSARTAN POTASSIUM 100 MG PO TABS: 100.0000 mg | ORAL_TABLET | Freq: Every day | ORAL | 1 refills | Status: DC

## 2017-02-21 MED ORDER — VITAMIN D (ERGOCALCIFEROL) 1.25 MG (50000 UNIT) PO CAPS: 50000.0000 [IU] | ORAL_CAPSULE | ORAL | 1 refills | Status: DC

## 2017-02-21 MED ORDER — LEVOTHYROXINE SODIUM 100 MCG PO TABS: ORAL_TABLET | ORAL | 1 refills | Status: DC

## 2017-02-21 MED ORDER — PITAVASTATIN CALCIUM 4 MG PO TABS: 1.0000 | ORAL_TABLET | Freq: Every day | ORAL | 1 refills | Status: DC

## 2017-02-21 NOTE — Telephone Encounter (Signed)
rxs sent in that patient requested.

## 2017-03-20 ENCOUNTER — Encounter: Payer: Self-pay | Admitting: Family Medicine

## 2017-03-20 ENCOUNTER — Ambulatory Visit (INDEPENDENT_AMBULATORY_CARE_PROVIDER_SITE_OTHER): Payer: Managed Care, Other (non HMO) | Admitting: Family Medicine

## 2017-03-20 ENCOUNTER — Ambulatory Visit: Payer: Managed Care, Other (non HMO) | Admitting: Family Medicine

## 2017-03-20 VITALS — BP 130/80 | HR 75 | Temp 98.5°F | Resp 16 | Ht 67.0 in | Wt 181.8 lb

## 2017-03-20 DIAGNOSIS — M545 Low back pain: Secondary | ICD-10-CM | POA: Diagnosis not present

## 2017-03-20 DIAGNOSIS — E785 Hyperlipidemia, unspecified: Secondary | ICD-10-CM

## 2017-03-20 DIAGNOSIS — M5416 Radiculopathy, lumbar region: Secondary | ICD-10-CM

## 2017-03-20 DIAGNOSIS — E039 Hypothyroidism, unspecified: Secondary | ICD-10-CM | POA: Diagnosis not present

## 2017-03-20 DIAGNOSIS — I10 Essential (primary) hypertension: Secondary | ICD-10-CM

## 2017-03-20 DIAGNOSIS — F411 Generalized anxiety disorder: Secondary | ICD-10-CM

## 2017-03-20 DIAGNOSIS — G8929 Other chronic pain: Secondary | ICD-10-CM | POA: Diagnosis not present

## 2017-03-20 LAB — COMPREHENSIVE METABOLIC PANEL
ALBUMIN: 4.6 g/dL (ref 3.5–5.2)
ALK PHOS: 66 U/L (ref 39–117)
ALT: 25 U/L (ref 0–35)
AST: 24 U/L (ref 0–37)
BUN: 10 mg/dL (ref 6–23)
CHLORIDE: 104 meq/L (ref 96–112)
CO2: 26 mEq/L (ref 19–32)
Calcium: 9.7 mg/dL (ref 8.4–10.5)
Creatinine, Ser: 0.98 mg/dL (ref 0.40–1.20)
GFR: 61.13 mL/min (ref >60.00)
GLUCOSE: 92 mg/dL (ref 70–99)
POTASSIUM: 4.2 meq/L (ref 3.5–5.1)
SODIUM: 139 meq/L (ref 135–145)
TOTAL PROTEIN: 7.6 g/dL (ref 6.0–8.3)
Total Bilirubin: 0.9 mg/dL (ref 0.2–1.2)

## 2017-03-20 LAB — LIPID PANEL
CHOLESTEROL: 159 mg/dL (ref 0–200)
HDL: 51 mg/dL (ref >39.00)
LDL CALC: 89 mg/dL (ref 0–99)
NonHDL: 107.93
Total CHOL/HDL Ratio: 3
Triglycerides: 94 mg/dL (ref 0.0–149.0)
VLDL: 18.8 mg/dL (ref 0.0–40.0)

## 2017-03-20 LAB — TSH: TSH: 1.14 u[IU]/mL (ref 0.35–4.50)

## 2017-03-20 MED ORDER — ALPRAZOLAM 0.25 MG PO TABS: 0.2500 mg | ORAL_TABLET | Freq: Every evening | ORAL | 0 refills | Status: DC | PRN

## 2017-03-20 MED ORDER — CYCLOBENZAPRINE HCL 5 MG PO TABS: 5.0000 mg | ORAL_TABLET | Freq: Three times a day (TID) | ORAL | 1 refills | Status: DC | PRN

## 2017-03-20 NOTE — Patient Instructions (Signed)

## 2017-03-20 NOTE — Progress Notes (Signed)
Patient ID: Keenan BachelorDiane Amison, female   DOB: June 10, 1956, 61 y.o.   MRN: [id number]     Subjective:  I acted as a Neurosurgeonscribe for Dr. Zola ButtonLowne-Chase.  Apolonio SchneidersSheketia, CMA   Patient ID: Keenan BachelorDiane Moxon, female    DOB: June 10, 1956, 61 y.o.   MRN: [id number]  Chief Complaint  Patient presents with  . Hypertension  . Hyperlipidemia    HPI  Patient is in today for follow up blood pressure and thyroid and cholesterol.   No complaints.    Patient Care Team: Zola ButtonLowne Chase, Grayling CongressYvonne R, DO as PCP - General (Family Medicine) Blondell RevealFletcher, Richard V, MD (Obstetrics and Gynecology) Sydnee CabalShearin, Mary D., MD as Referring Physician (Gastroenterology) Fletcher AnonBardelas, Jose A, MD as Consulting Physician (Allergy and Immunology) Althea CharonBertrand, Margaret Lins, MD (Radiology)   Past Medical History:  Diagnosis Date  . Anxiety   . Hyperlipidemia   . Hypertension   . Plantar fasciitis   . Thyroid disease     Past Surgical History:  Procedure Laterality Date  . ABDOMINAL HYSTERECTOMY    . BREAST SURGERY    . NOSE SURGERY      Family History  Problem Relation Age of Onset  . Heart disease Mother   . Stroke Mother   . Hypertension Mother   . Cancer Mother        ovarian  . Aneurysm Father   . Hypertension Father   . Heart disease Father   . Kidney disease Father   . Heart disease Sister     Social History   Social History  . Marital status: Married    Spouse name: N/A  . Number of children: N/A  . Years of education: N/A   Occupational History  . Not on file.   Social History Main Topics  . Smoking status: Never Smoker  . Smokeless tobacco: Never Used  . Alcohol use No  . Drug use: No  . Sexual activity: Not on file   Other Topics Concern  . Not on file   Social History Narrative  . No narrative on file    Outpatient Medications Prior to Visit  Medication Sig Dispense Refill  . flunisolide (NASALIDE) 25 MCG/ACT (0.025%) SOLN Place 2 sprays into the nose 2 (two) times daily. 1 Bottle 5  . levothyroxine  (SYNTHROID, LEVOTHROID) 100 MCG tablet TAKE 1 TABLET BY MOUTH EVERY DAY BEFORE BREAKFAST 90 tablet 1  . losartan (COZAAR) 100 MG tablet Take 1 tablet (100 mg total) by mouth daily. 90 tablet 1  . meloxicam (MOBIC) 15 MG tablet TAKE 1/2 TO 1 TABLET BY MOUTH ONCE A DAY 30 tablet 0  . montelukast (SINGULAIR) 10 MG tablet Take 1 tablet (10 mg total) by mouth daily. 30 tablet 5  . Pitavastatin Calcium (LIVALO) 4 MG TABS Take 1 tablet (4 mg total) by mouth daily. 90 tablet 1  . Vitamin D, Ergocalciferol, (DRISDOL) 50000 units CAPS capsule Take 1 capsule (50,000 Units total) by mouth every 7 (seven) days. 12 capsule 1  . ALPRAZolam (XANAX) 0.25 MG tablet Take 1 tablet (0.25 mg total) by mouth at bedtime as needed for anxiety. 30 tablet 0  . cyclobenzaprine (FLEXERIL) 10 MG tablet Take 1 tablet (10 mg total) by mouth 3 (three) times daily as needed for muscle spasms. 30 tablet 1  . Risedronate Sodium 35 MG TBEC Take 35 mg by mouth once a week.     No facility-administered medications prior to visit.     Allergies  Allergen Reactions  . Neta MendsBelviq Xr [  Lorcaserin Hcl Er] Itching    Caused her face to turn red.  [name] [Liraglutide -Weight Management] Other (See Comments)    Caused indigestion.  Marland Kitchen. Penicillins Rash    Review of Systems  Constitutional: Negative for chills, fever and malaise/fatigue.  HENT: Negative for congestion and hearing loss.   Eyes: Negative for blurred vision and discharge.  Respiratory: Negative for cough, sputum production and shortness of breath.   Cardiovascular: Negative for chest pain, palpitations and leg swelling.  Gastrointestinal: Negative for abdominal pain, blood in stool, constipation, diarrhea, heartburn, nausea and vomiting.  Genitourinary: Negative for dysuria, frequency, hematuria and urgency.  Musculoskeletal: Negative for back pain, falls and myalgias.  Skin: Negative for rash.  Neurological: Negative for dizziness, sensory change, loss of consciousness,  weakness and headaches.  Endo/Heme/Allergies: Negative for environmental allergies. Does not bruise/bleed easily.  Psychiatric/Behavioral: Negative for depression and suicidal ideas. The patient is not nervous/anxious and does not have insomnia.        Objective:    Physical Exam  Constitutional: She is oriented to person, place, and time. She appears well-developed and well-nourished. No distress.  HENT:  Head: Normocephalic and atraumatic.  Eyes: Conjunctivae and EOM are normal.  Neck: Normal range of motion. Neck supple. No JVD present. Carotid bruit is not present. No thyromegaly present.  Cardiovascular: Normal rate, regular rhythm and normal heart sounds.   No murmur heard. Pulmonary/Chest: Effort normal and breath sounds normal. No respiratory distress. She has no wheezes. She has no rales. She exhibits no tenderness.  Abdominal: Soft. Bowel sounds are normal. There is no tenderness.  Musculoskeletal: Normal range of motion. She exhibits no edema or deformity.  Neurological: She is alert and oriented to person, place, and time.  Skin: Skin is warm and dry. She is not diaphoretic.  Psychiatric: She has a normal mood and affect. Her behavior is normal. Judgment and thought content normal.  Nursing note and vitals reviewed.   BP 130/80 (BP Location: Right Arm, Cuff Size: Normal)   Pulse 75   Temp 98.5 F (36.9 C) (Oral)   Resp 16   Ht 5\' 7"  (1.702 m)   Wt 181 lb 12.8 oz (82.5 kg)   SpO2 97%   BMI 28.47 kg/m  Wt Readings from Last 3 Encounters:  03/20/17 181 lb 12.8 oz (82.5 kg)  12/20/16 182 lb 9.6 oz (82.8 kg)  11/15/16 184 lb 3.2 oz (83.6 kg)   BP Readings from Last 3 Encounters:  03/20/17 130/80  12/20/16 118/78  11/15/16 129/77     Immunization History  Administered Date(s) Administered  . Influenza,inj,Quad PF,36+ Mos 05/26/2015, 07/08/2016  . Tdap 09/08/2015    Health Maintenance  Topic Date Due  . HIV Screening  04/14/1970  . INFLUENZA VACCINE   03/26/2017  . MAMMOGRAM  10/10/2018  . COLONOSCOPY  06/05/2025  . TETANUS/TDAP  09/07/2025  . Hepatitis C Screening  Completed    Lab Results  Component Value Date   WBC 7.4 09/08/2015   HGB 15.4 (H) 09/08/2015   HCT 45.7 09/08/2015   PLT 249.0 09/08/2015   GLUCOSE 92 03/20/2017   CHOL 159 03/20/2017   TRIG 94.0 03/20/2017   HDL 51.00 03/20/2017   LDLCALC 89 03/20/2017   ALT 25 03/20/2017   AST 24 03/20/2017   NA 139 03/20/2017   K 4.2 03/20/2017   CL 104 03/20/2017   CREATININE 0.98 03/20/2017   BUN 10 03/20/2017   CO2 26 03/20/2017   TSH 1.14 03/20/2017  Lab Results  Component Value Date   TSH 1.14 03/20/2017   Lab Results  Component Value Date   WBC 7.4 09/08/2015   HGB 15.4 (H) 09/08/2015   HCT 45.7 09/08/2015   MCV 89.7 09/08/2015   PLT 249.0 09/08/2015   Lab Results  Component Value Date   NA 139 03/20/2017   K 4.2 03/20/2017   CO2 26 03/20/2017   GLUCOSE 92 03/20/2017   BUN 10 03/20/2017   CREATININE 0.98 03/20/2017   BILITOT 0.9 03/20/2017   ALKPHOS 66 03/20/2017   AST 24 03/20/2017   ALT 25 03/20/2017   PROT 7.6 03/20/2017   ALBUMIN 4.6 03/20/2017   CALCIUM 9.7 03/20/2017   GFR 61.13 03/20/2017   Lab Results  Component Value Date   CHOL 159 03/20/2017   Lab Results  Component Value Date   HDL 51.00 03/20/2017   Lab Results  Component Value Date   LDLCALC 89 03/20/2017   Lab Results  Component Value Date   TRIG 94.0 03/20/2017   Lab Results  Component Value Date   CHOLHDL 3 03/20/2017   No results found for: HGBA1C       Assessment & Plan:   Problem List Items Addressed This Visit      Unprioritized   Essential hypertension - Primary    Well controlled, no changes to meds. Encouraged heart healthy diet such as the DASH diet and exercise as tolerated.       Relevant Orders   Comprehensive metabolic panel (Completed)   Lipid panel (Completed)   Generalized anxiety disorder   Relevant Medications   ALPRAZolam  (XANAX) 0.25 MG tablet   Hyperlipidemia LDL goal <100    Tolerating statin, encouraged heart healthy diet, avoid trans fats, minimize simple carbs and saturated fats. Increase exercise as tolerated      Relevant Orders   Comprehensive metabolic panel (Completed)   Lipid panel (Completed)   Hypothyroidism    Stable con't meds Check labs      Relevant Orders   TSH (Completed)   Lumbar radiculopathy, acute    Muscle relaxer Consider ortho/ pt       Relevant Medications   ALPRAZolam (XANAX) 0.25 MG tablet   cyclobenzaprine (FLEXERIL) 5 MG tablet    Other Visit Diagnoses    Chronic low back pain, unspecified back pain laterality, with sciatica presence unspecified       Relevant Medications   cyclobenzaprine (FLEXERIL) 5 MG tablet      I have discontinued Ms. Ursin's Risedronate Sodium and cyclobenzaprine. I am also having her start on cyclobenzaprine. Additionally, I am having her maintain her montelukast, flunisolide, meloxicam, levothyroxine, losartan, Pitavastatin Calcium, Vitamin D (Ergocalciferol), risedronate, ipratropium, and ALPRAZolam.  Meds ordered this encounter  Medications  . risedronate (ACTONEL) 35 MG tablet  . ipratropium (ATROVENT) 0.06 % nasal spray    Sig: USE 1 SPRAY BY NASAL ROUTE 2 TIMES DAILY.    Refill:  0  . ALPRAZolam (XANAX) 0.25 MG tablet    Sig: Take 1 tablet (0.25 mg total) by mouth at bedtime as needed for anxiety.    Dispense:  30 tablet    Refill:  0  . cyclobenzaprine (FLEXERIL) 5 MG tablet    Sig: Take 1 tablet (5 mg total) by mouth 3 (three) times daily as needed for muscle spasms.    Dispense:  30 tablet    Refill:  1    CMA served as scribe during this visit. History, Physical and Plan performed by  medical provider. Documentation and orders reviewed and attested to.  Ann Held, DO

## 2017-03-22 DIAGNOSIS — E039 Hypothyroidism, unspecified: Secondary | ICD-10-CM | POA: Insufficient documentation

## 2017-03-22 DIAGNOSIS — F411 Generalized anxiety disorder: Secondary | ICD-10-CM

## 2017-03-22 DIAGNOSIS — E785 Hyperlipidemia, unspecified: Secondary | ICD-10-CM | POA: Insufficient documentation

## 2017-03-22 HISTORY — DX: Hypothyroidism, unspecified: E03.9

## 2017-03-22 HISTORY — DX: Generalized anxiety disorder: F41.1

## 2017-03-22 NOTE — Assessment & Plan Note (Signed)
Muscle relaxer Consider ortho/ pt

## 2017-03-22 NOTE — Assessment & Plan Note (Signed)
Stable con't meds Check labs 

## 2017-03-22 NOTE — Assessment & Plan Note (Signed)
Well controlled, no changes to meds. Encouraged heart healthy diet such as the DASH diet and exercise as tolerated.  °

## 2017-03-22 NOTE — Assessment & Plan Note (Signed)
Tolerating statin, encouraged heart healthy diet, avoid trans fats, minimize simple carbs and saturated fats. Increase exercise as tolerated 

## 2017-04-29 ENCOUNTER — Encounter: Payer: Self-pay | Admitting: Family Medicine

## 2017-05-12 ENCOUNTER — Telehealth: Payer: Self-pay | Admitting: Family Medicine

## 2017-05-12 NOTE — Telephone Encounter (Signed)
Patient called in today 05/12/2017 requesting most recent bone density results/ Informed her per PCP instructions Osteroporosis---can consider Prolia injection if pt would like The patient while on the phone informed of side pain that she has been experiencing. She requested to schedule an appointment to discuss Bone Denisty and side pain. Scheduled appt on 05/15/2017

## 2017-05-13 ENCOUNTER — Ambulatory Visit: Payer: Managed Care, Other (non HMO) | Admitting: Family Medicine

## 2017-05-15 ENCOUNTER — Ambulatory Visit: Payer: Managed Care, Other (non HMO) | Admitting: Family Medicine

## 2017-05-15 DIAGNOSIS — Z0289 Encounter for other administrative examinations: Secondary | ICD-10-CM

## 2017-06-19 ENCOUNTER — Ambulatory Visit (INDEPENDENT_AMBULATORY_CARE_PROVIDER_SITE_OTHER): Payer: Managed Care, Other (non HMO)

## 2017-06-19 DIAGNOSIS — Z23 Encounter for immunization: Secondary | ICD-10-CM | POA: Diagnosis not present

## 2017-06-30 ENCOUNTER — Other Ambulatory Visit (INDEPENDENT_AMBULATORY_CARE_PROVIDER_SITE_OTHER): Payer: Managed Care, Other (non HMO)

## 2017-06-30 ENCOUNTER — Telehealth: Payer: Self-pay | Admitting: *Deleted

## 2017-06-30 DIAGNOSIS — R300 Dysuria: Secondary | ICD-10-CM

## 2017-06-30 LAB — POC URINALSYSI DIPSTICK (AUTOMATED)
BILIRUBIN UA: NEGATIVE
Glucose, UA: NEGATIVE
KETONES UA: NEGATIVE
Leukocytes, UA: NEGATIVE
Nitrite, UA: NEGATIVE
PH UA: 6 (ref 5.0–8.0)
PROTEIN UA: NEGATIVE
Urobilinogen, UA: 0.2 E.U./dL

## 2017-06-30 NOTE — Telephone Encounter (Signed)
Patient would like to see if she can get anything for a UTI? She is here today with her mother in the office.  She has had some dysuria for 2 days.  She has been taking AZO.

## 2017-06-30 NOTE — Telephone Encounter (Signed)
Ok to check UA and send for culture if abnormal

## 2017-06-30 NOTE — Telephone Encounter (Signed)
ua ran and culture sent.

## 2017-07-02 NOTE — Telephone Encounter (Signed)
Can you check on culture?

## 2017-07-02 NOTE — Telephone Encounter (Signed)
Patient called to have ua results reviewed. Patient states that she is miserable & would like a medication prescribed. Please advise patient of results.

## 2017-07-03 LAB — URINE CULTURE
MICRO NUMBER:: 81239875
SPECIMEN QUALITY: ADEQUATE

## 2017-07-03 MED ORDER — CIPROFLOXACIN HCL 250 MG PO TABS: 250.0000 mg | ORAL_TABLET | Freq: Two times a day (BID) | ORAL | 0 refills | Status: DC

## 2017-07-03 NOTE — Telephone Encounter (Signed)
Notes recorded by Conrad Burlingtonanter, Ruffin Lada, CMA on 07/03/2017 at 2:32 PM EST LMOM informing Pt of urine results and recommendations. Rx sent to [facility]. ------  Notes recorded by Zola ButtonLowne Chase, Grayling CongressYvonne R, DO on 07/03/2017 at 12:33 PM EST + cipro 250 mg bid x 3 days ==----- + UTI ------  Notes recorded by Orlene Ochorrence, Jasmine N, CMA on 07/02/2017 at 9:09 AM EST Viewed by Keenan Bacheloriane Lecomte on 07/02/2017 5:17 AM ------  Notes recorded by Donato SchultzLowne Chase, Yvonne R, DO on 06/30/2017 at 3:10 PM EST Culture pending --- it will take 48 hours before it comes back

## 2017-07-03 NOTE — Addendum Note (Signed)
Addended byConrad Oologah: Meena Barrantes D on: 07/03/2017 02:31 PM   Modules accepted: Orders

## 2017-08-29 ENCOUNTER — Telehealth: Payer: Self-pay | Admitting: *Deleted

## 2017-08-29 NOTE — Telephone Encounter (Signed)
[facility]; forwarded to provider/SLS 01/04

## 2017-09-04 ENCOUNTER — Ambulatory Visit: Payer: Managed Care, Other (non HMO) | Admitting: Family Medicine

## 2017-09-04 ENCOUNTER — Telehealth: Payer: Self-pay | Admitting: *Deleted

## 2017-09-04 ENCOUNTER — Encounter: Payer: Self-pay | Admitting: Family Medicine

## 2017-09-04 VITALS — BP 126/80 | HR 80 | Temp 98.1°F | Resp 16 | Ht 67.0 in | Wt 180.6 lb

## 2017-09-04 DIAGNOSIS — R1031 Right lower quadrant pain: Secondary | ICD-10-CM

## 2017-09-04 LAB — COMPREHENSIVE METABOLIC PANEL
ALT: 22 U/L (ref 0–35)
AST: 22 U/L (ref 0–37)
Albumin: 4.6 g/dL (ref 3.5–5.2)
Alkaline Phosphatase: 71 U/L (ref 39–117)
BILIRUBIN TOTAL: 0.6 mg/dL (ref 0.2–1.2)
BUN: 15 mg/dL (ref 6–23)
CALCIUM: 9.5 mg/dL (ref 8.4–10.5)
CO2: 28 meq/L (ref 19–32)
CREATININE: 1.09 mg/dL (ref 0.40–1.20)
Chloride: 105 mEq/L (ref 96–112)
GFR: 53.99 mL/min — ABNORMAL LOW (ref >60.00)
GLUCOSE: 96 mg/dL (ref 70–99)
Potassium: 4.2 mEq/L (ref 3.5–5.1)
SODIUM: 140 meq/L (ref 135–145)
Total Protein: 7.6 g/dL (ref 6.0–8.3)

## 2017-09-04 LAB — CBC WITH DIFFERENTIAL/PLATELET
BASOS ABS: 0.1 10*3/uL (ref 0.0–0.1)
Basophils Relative: 1 % (ref 0.0–3.0)
EOS ABS: 0.2 10*3/uL (ref 0.0–0.7)
Eosinophils Relative: 2.5 % (ref 0.0–5.0)
HCT: 45.1 % (ref 36.0–46.0)
HEMOGLOBIN: 15 g/dL (ref 12.0–15.0)
LYMPHS PCT: 34.6 % (ref 12.0–46.0)
Lymphs Abs: 2.6 10*3/uL (ref 0.7–4.0)
MCHC: 33.3 g/dL (ref 30.0–36.0)
MCV: 92.1 fl (ref 78.0–100.0)
Monocytes Absolute: 0.5 10*3/uL (ref 0.1–1.0)
Monocytes Relative: 7 % (ref 3.0–12.0)
NEUTROS ABS: 4.1 10*3/uL (ref 1.4–7.7)
Neutrophils Relative %: 54.9 % (ref 43.0–77.0)
PLATELETS: 252 10*3/uL (ref 150.0–400.0)
RBC: 4.89 Mil/uL (ref 3.87–5.11)
RDW: 13.1 % (ref 11.5–15.5)
WBC: 7.5 10*3/uL (ref 4.0–10.5)

## 2017-09-04 NOTE — Progress Notes (Signed)
Patient ID: Kathryn BachelorDiane Boyer, female   DOB: Aug 19, 1956, 62 y.o.   MRN: [id number]    Subjective:  I acted as Boyer Kathryn Boyer for Dr. Zola Boyer.  Kathryn Boyer, CMA   Patient ID: Kathryn BachelorDiane Boyer, female    DOB: Aug 19, 1956, 62 y.o.   MRN: [id number]  Chief Complaint  Patient presents with  . Hernia    HPI  Patient is in today for possible hernia.  She has been having lower abdominal pain.  Noticed on and off for about one week.   Patient Care Team: Kathryn Boyer, Kathryn CongressYvonne R, DO as PCP - General (Family Medicine) Kathryn RevealFletcher, Richard V, MD (Obstetrics and Gynecology) Kathryn CabalShearin, Mary D., MD as Referring Physician (Gastroenterology) Kathryn AnonBardelas, Jose A, MD as Consulting Physician (Allergy and Immunology) Kathryn CharonBertrand, Margaret Lins, MD (Radiology)   Past Medical History:  Diagnosis Date  . Anxiety   . Hyperlipidemia   . Hypertension   . Plantar fasciitis   . Thyroid disease     Past Surgical History:  Procedure Laterality Date  . ABDOMINAL HYSTERECTOMY    . BREAST SURGERY    . NOSE SURGERY      Family History  Problem Relation Age of Onset  . Heart disease Mother   . Stroke Mother   . Hypertension Mother   . Cancer Mother        ovarian  . Aneurysm Father   . Hypertension Father   . Heart disease Father   . Kidney disease Father   . Heart disease Sister     Social History   Socioeconomic History  . Marital status: Married    Spouse name: Not on file  . Number of children: Not on file  . Years of education: Not on file  . Highest education level: Not on file  Social Needs  . Financial resource strain: Not on file  . Food insecurity - worry: Not on file  . Food insecurity - inability: Not on file  . Transportation needs - medical: Not on file  . Transportation needs - non-medical: Not on file  Occupational History  . Not on file  Tobacco Use  . Smoking status: Never Smoker  . Smokeless tobacco: Never Used  Substance and Sexual Activity  . Alcohol use: No  . Drug use: No  . Sexual  activity: Not on file  Other Topics Concern  . Not on file  Social History Narrative  . Not on file    Outpatient Medications Prior to Visit  Medication Sig Dispense Refill  . ALPRAZolam (XANAX) 0.25 MG tablet Take 1 tablet (0.25 mg total) by mouth at bedtime as needed for anxiety. 30 tablet 0  . ciprofloxacin (CIPRO) 250 MG tablet Take 1 tablet (250 mg total) 2 (two) times daily by mouth. 6 tablet 0  . cyclobenzaprine (FLEXERIL) 5 MG tablet Take 1 tablet (5 mg total) by mouth 3 (three) times daily as needed for muscle spasms. 30 tablet 1  . flunisolide (NASALIDE) 25 MCG/ACT (0.025%) SOLN Place 2 sprays into the nose 2 (two) times daily. 1 Bottle 5  . ipratropium (ATROVENT) 0.06 % nasal spray USE 1 SPRAY BY NASAL ROUTE 2 TIMES DAILY.  0  . levothyroxine (SYNTHROID, LEVOTHROID) 100 MCG tablet TAKE 1 TABLET BY MOUTH EVERY DAY BEFORE BREAKFAST 90 tablet 1  . losartan (COZAAR) 100 MG tablet Take 1 tablet (100 mg total) by mouth daily. 90 tablet 1  . meloxicam (MOBIC) 15 MG tablet TAKE 1/2 TO 1 TABLET BY MOUTH ONCE Boyer DAY 30 tablet  0  . montelukast (SINGULAIR) 10 MG tablet Take 1 tablet (10 mg total) by mouth daily. 30 tablet 5  . Pitavastatin Calcium (LIVALO) 4 MG TABS Take 1 tablet (4 mg total) by mouth daily. 90 tablet 1  . risedronate (ACTONEL) 35 MG tablet     . Vitamin D, Ergocalciferol, (DRISDOL) 50000 units CAPS capsule Take 1 capsule (50,000 Units total) by mouth every 7 (seven) days. 12 capsule 1   No facility-administered medications prior to visit.     Allergies  Allergen Reactions  . Belviq Xr [Lorcaserin Hcl Er] Itching    Caused her face to turn red.  Bernie Covey [Liraglutide -Weight Management] Other (See Comments)    Caused indigestion.  Marland Kitchen Penicillins Rash    Review of Systems  Constitutional: Negative for fever and malaise/fatigue.  HENT: Negative for congestion.   Eyes: Negative for blurred vision.  Respiratory: Negative for cough and shortness of breath.     Cardiovascular: Negative for chest pain, palpitations and leg swelling.  Gastrointestinal: Positive for abdominal pain (lower). Negative for vomiting.  Musculoskeletal: Negative for back pain.  Skin: Negative for rash.  Neurological: Negative for loss of consciousness and headaches.       Objective:    Physical Exam  Constitutional: She is oriented to person, place, and time. She appears well-developed and well-nourished.  HENT:  Head: Normocephalic and atraumatic.  Eyes: Conjunctivae and EOM are normal.  Neck: Normal range of motion. Neck supple. No JVD present. Carotid bruit is not present. No thyromegaly present.  Cardiovascular: Normal rate, regular rhythm and normal heart sounds.  No murmur heard. Pulmonary/Chest: Effort normal and breath sounds normal. No respiratory distress. She has no wheezes. She has no rales. She exhibits no tenderness.  Abdominal: She exhibits no mass. There is tenderness in the right lower quadrant. There is guarding. There is no rebound, no tenderness at McBurney's point and negative Murphy's sign.  Musculoskeletal: She exhibits no edema.  Neurological: She is alert and oriented to person, place, and time.  Psychiatric: She has Boyer normal mood and affect.  Nursing note and vitals reviewed.   BP 126/80 (BP Location: Right Arm, Cuff Size: Normal)   Pulse 80   Temp 98.1 F (36.7 C) (Oral)   Resp 16   Ht 5\' 7"  (1.702 m)   Wt 180 lb 9.6 oz (81.9 kg)   SpO2 96%   BMI 28.29 kg/m  Wt Readings from Last 3 Encounters:  09/04/17 180 lb 9.6 oz (81.9 kg)  03/20/17 181 lb 12.8 oz (82.5 kg)  12/20/16 182 lb 9.6 oz (82.8 kg)   BP Readings from Last 3 Encounters:  09/04/17 126/80  03/20/17 130/80  12/20/16 118/78     Immunization History  Administered Date(s) Administered  . Influenza,inj,Quad PF,6+ Mos 05/26/2015, 07/08/2016, 06/19/2017  . Tdap 09/08/2015    Health Maintenance  Topic Date Due  . HIV Screening  04/14/1970  . MAMMOGRAM  10/10/2018   . COLONOSCOPY  06/05/2025  . TETANUS/TDAP  09/07/2025  . INFLUENZA VACCINE  Completed  . Hepatitis C Screening  Completed    Lab Results  Component Value Date   WBC 7.4 09/08/2015   HGB 15.4 (H) 09/08/2015   HCT 45.7 09/08/2015   PLT 249.0 09/08/2015   GLUCOSE 92 03/20/2017   CHOL 159 03/20/2017   TRIG 94.0 03/20/2017   HDL 51.00 03/20/2017   LDLCALC 89 03/20/2017   ALT 25 03/20/2017   AST 24 03/20/2017   NA 139 03/20/2017  K 4.2 03/20/2017   CL 104 03/20/2017   CREATININE 0.98 03/20/2017   BUN 10 03/20/2017   CO2 26 03/20/2017   TSH 1.14 03/20/2017    Lab Results  Component Value Date   TSH 1.14 03/20/2017   Lab Results  Component Value Date   WBC 7.4 09/08/2015   HGB 15.4 (H) 09/08/2015   HCT 45.7 09/08/2015   MCV 89.7 09/08/2015   PLT 249.0 09/08/2015   Lab Results  Component Value Date   NA 139 03/20/2017   K 4.2 03/20/2017   CO2 26 03/20/2017   GLUCOSE 92 03/20/2017   BUN 10 03/20/2017   CREATININE 0.98 03/20/2017   BILITOT 0.9 03/20/2017   ALKPHOS 66 03/20/2017   AST 24 03/20/2017   ALT 25 03/20/2017   PROT 7.6 03/20/2017   ALBUMIN 4.6 03/20/2017   CALCIUM 9.7 03/20/2017   GFR 61.13 03/20/2017   Lab Results  Component Value Date   CHOL 159 03/20/2017   Lab Results  Component Value Date   HDL 51.00 03/20/2017   Lab Results  Component Value Date   LDLCALC 89 03/20/2017   Lab Results  Component Value Date   TRIG 94.0 03/20/2017   Lab Results  Component Value Date   CHOLHDL 3 03/20/2017   No results found for: HGBA1C       Assessment & Plan:   Problem List Items Addressed This Visit    None    Visit Diagnoses    RLQ abdominal pain    -  Primary   Relevant Orders   CT Abdomen Pelvis W Contrast   CBC with Differential/Platelet   Comprehensive metabolic panel   Right lower quadrant abdominal pain       Relevant Orders   CT Abdomen Pelvis W Contrast      I am having Josanna Blumberg maintain her montelukast, flunisolide,  meloxicam, levothyroxine, losartan, Pitavastatin Calcium, Vitamin D (Ergocalciferol), risedronate, ipratropium, ALPRAZolam, cyclobenzaprine, and ciprofloxacin.  No orders of the defined types were placed in this encounter.   CMA served as Neurosurgeon during this visit. History, Physical and Plan performed by medical provider. Documentation and orders reviewed and attested to.  Donato Schultz, DO

## 2017-09-04 NOTE — Telephone Encounter (Signed)
Patient would like to try Prolia.  She has tried actonel in the past.

## 2017-09-04 NOTE — Patient Instructions (Signed)

## 2017-09-05 ENCOUNTER — Other Ambulatory Visit: Payer: Self-pay | Admitting: Family Medicine

## 2017-09-05 NOTE — Telephone Encounter (Signed)
Ok, has Kathryn Boyer verified benefits yet? If not, we should ask her to do that first. Once we know benefits, inform pt and see if she wants to proceed after she knows what her potential cost will be. If so, I will order medication at that time.

## 2017-09-06 ENCOUNTER — Encounter (HOSPITAL_BASED_OUTPATIENT_CLINIC_OR_DEPARTMENT_OTHER): Payer: Self-pay

## 2017-09-06 ENCOUNTER — Ambulatory Visit (HOSPITAL_BASED_OUTPATIENT_CLINIC_OR_DEPARTMENT_OTHER)
Admission: RE | Admit: 2017-09-06 | Discharge: 2017-09-06 | Disposition: A | Discharge status: Home / Self Care | Payer: Managed Care, Other (non HMO) | Source: Ambulatory Visit | Attending: Family Medicine | Admitting: Family Medicine

## 2017-09-06 DIAGNOSIS — K5730 Diverticulosis of large intestine without perforation or abscess without bleeding: Secondary | ICD-10-CM | POA: Diagnosis not present

## 2017-09-06 DIAGNOSIS — R1031 Right lower quadrant pain: Secondary | ICD-10-CM | POA: Insufficient documentation

## 2017-09-06 MED ORDER — IOPAMIDOL (ISOVUE-300) INJECTION 61%
100.0000 mL | Freq: Once | INTRAVENOUS | Status: AC | PRN
  Administered 2017-09-06: 100 mL via INTRAVENOUS

## 2017-09-17 NOTE — Telephone Encounter (Signed)
Prolia benefits received PA is required $25 copay covers Prolia, admin and OV   Patient may owe approximately $25 OOP  *Routing to [name] for [employer] PA  Letter mailed to inform patient of benefits and to schedule

## 2017-09-17 NOTE — Telephone Encounter (Signed)
PA initiated via Covermymeds; KEY: [id number]. Awaiting determination.

## 2017-09-18 NOTE — Telephone Encounter (Signed)
PA approved effective 09/17/2017 through 09/17/2018.

## 2017-09-22 ENCOUNTER — Other Ambulatory Visit: Payer: Self-pay | Admitting: *Deleted

## 2017-09-22 ENCOUNTER — Ambulatory Visit: Payer: Managed Care, Other (non HMO) | Admitting: Family Medicine

## 2017-09-22 ENCOUNTER — Other Ambulatory Visit (INDEPENDENT_AMBULATORY_CARE_PROVIDER_SITE_OTHER): Payer: Managed Care, Other (non HMO)

## 2017-09-22 DIAGNOSIS — E785 Hyperlipidemia, unspecified: Secondary | ICD-10-CM

## 2017-09-22 DIAGNOSIS — E039 Hypothyroidism, unspecified: Secondary | ICD-10-CM | POA: Diagnosis not present

## 2017-09-22 DIAGNOSIS — I10 Essential (primary) hypertension: Secondary | ICD-10-CM | POA: Diagnosis not present

## 2017-09-22 DIAGNOSIS — Z0000 Encounter for general adult medical examination without abnormal findings: Secondary | ICD-10-CM

## 2017-09-22 LAB — CBC WITH DIFFERENTIAL/PLATELET
BASOS PCT: 0.7 % (ref 0.0–3.0)
Basophils Absolute: 0.1 10*3/uL (ref 0.0–0.1)
EOS ABS: 0.1 10*3/uL (ref 0.0–0.7)
Eosinophils Relative: 1.8 % (ref 0.0–5.0)
HCT: 46.3 % — ABNORMAL HIGH (ref 36.0–46.0)
Hemoglobin: 15.5 g/dL — ABNORMAL HIGH (ref 12.0–15.0)
LYMPHS ABS: 2.3 10*3/uL (ref 0.7–4.0)
Lymphocytes Relative: 31 % (ref 12.0–46.0)
MCHC: 33.4 g/dL (ref 30.0–36.0)
MCV: 92.5 fl (ref 78.0–100.0)
MONO ABS: 0.4 10*3/uL (ref 0.1–1.0)
Monocytes Relative: 6.1 % (ref 3.0–12.0)
NEUTROS ABS: 4.4 10*3/uL (ref 1.4–7.7)
NEUTROS PCT: 60.4 % (ref 43.0–77.0)
PLATELETS: 274 10*3/uL (ref 150.0–400.0)
RBC: 5.01 Mil/uL (ref 3.87–5.11)
RDW: 13 % (ref 11.5–15.5)
WBC: 7.3 10*3/uL (ref 4.0–10.5)

## 2017-09-22 LAB — COMPREHENSIVE METABOLIC PANEL
ALBUMIN: 4.7 g/dL (ref 3.5–5.2)
ALT: 20 U/L (ref 0–35)
AST: 22 U/L (ref 0–37)
Alkaline Phosphatase: 77 U/L (ref 39–117)
BUN: 13 mg/dL (ref 6–23)
CHLORIDE: 105 meq/L (ref 96–112)
CO2: 27 meq/L (ref 19–32)
CREATININE: 1 mg/dL (ref 0.40–1.20)
Calcium: 10 mg/dL (ref 8.4–10.5)
GFR: 59.63 mL/min — ABNORMAL LOW (ref >60.00)
Glucose, Bld: 105 mg/dL — ABNORMAL HIGH (ref 70–99)
Potassium: 4.8 mEq/L (ref 3.5–5.1)
SODIUM: 141 meq/L (ref 135–145)
Total Bilirubin: 0.8 mg/dL (ref 0.2–1.2)
Total Protein: 7.8 g/dL (ref 6.0–8.3)

## 2017-09-22 LAB — POC URINALSYSI DIPSTICK (AUTOMATED)
Bilirubin, UA: NEGATIVE
Blood, UA: NEGATIVE
Glucose, UA: NEGATIVE
Ketones, UA: NEGATIVE
Leukocytes, UA: NEGATIVE
Nitrite, UA: NEGATIVE
Protein, UA: NEGATIVE
UROBILINOGEN UA: 0.2 U/dL
pH, UA: 6 (ref 5.0–8.0)

## 2017-09-22 LAB — LIPID PANEL
CHOLESTEROL: 162 mg/dL (ref 0–200)
HDL: 56.8 mg/dL (ref >39.00)
LDL CALC: 84 mg/dL (ref 0–99)
NonHDL: 104.74
Total CHOL/HDL Ratio: 3
Triglycerides: 104 mg/dL (ref 0.0–149.0)
VLDL: 20.8 mg/dL (ref 0.0–40.0)

## 2017-09-22 LAB — TSH: TSH: 0.81 u[IU]/mL (ref 0.35–4.50)

## 2017-09-24 ENCOUNTER — Telehealth: Payer: Self-pay | Admitting: *Deleted

## 2017-09-24 NOTE — Telephone Encounter (Signed)
Copied from CRM [phone]. Topic: General - Other >> Sep 24, 2017 11:15 AM Arlyss Gandyichardson, Taren N, NT wrote: Reason for CRM: Pt returning call to office. No CRM in chart of why pt was contacted. Please return call.

## 2017-09-25 NOTE — Telephone Encounter (Signed)
Do not see a message in chart about a call.   Patient has commented about the Prolia shot that she wants to wait to get it because of all the side effects about it.  She will talk with Dr. Laury AxonLowne at appt in March.

## 2017-09-29 ENCOUNTER — Other Ambulatory Visit: Payer: Self-pay | Admitting: Family Medicine

## 2017-10-02 ENCOUNTER — Encounter: Payer: Self-pay | Admitting: *Deleted

## 2017-10-02 ENCOUNTER — Other Ambulatory Visit: Payer: Self-pay | Admitting: *Deleted

## 2017-10-02 DIAGNOSIS — I10 Essential (primary) hypertension: Secondary | ICD-10-CM

## 2017-10-02 DIAGNOSIS — E039 Hypothyroidism, unspecified: Secondary | ICD-10-CM

## 2017-10-02 DIAGNOSIS — E785 Hyperlipidemia, unspecified: Secondary | ICD-10-CM

## 2017-10-09 ENCOUNTER — Other Ambulatory Visit: Payer: Self-pay

## 2017-10-09 DIAGNOSIS — M62838 Other muscle spasm: Secondary | ICD-10-CM

## 2017-10-09 MED ORDER — MELOXICAM 15 MG PO TABS: ORAL_TABLET | ORAL | 0 refills | Status: DC

## 2017-11-10 ENCOUNTER — Encounter: Payer: Managed Care, Other (non HMO) | Admitting: Family Medicine

## 2017-11-10 ENCOUNTER — Other Ambulatory Visit: Payer: Self-pay | Admitting: Family Medicine

## 2017-11-10 DIAGNOSIS — F411 Generalized anxiety disorder: Secondary | ICD-10-CM

## 2017-11-10 NOTE — Telephone Encounter (Signed)
[facility] requesting refill for alprazolam  Database ran and is on your desk for review.  Last filled per database: 03/21/17 Last written: 03/20/17 Last ov: 03/20/17 Next ov: 11/18/17 Contract: will get at next visit 11/18/17 UDS: will get at next visit 11/18/17

## 2017-11-11 MED ORDER — ALPRAZOLAM 0.25 MG PO TABS: 0.2500 mg | ORAL_TABLET | Freq: Every evening | ORAL | 0 refills | Status: DC | PRN

## 2017-11-12 NOTE — Telephone Encounter (Signed)
rx faxed

## 2017-11-14 ENCOUNTER — Telehealth: Payer: Self-pay

## 2017-11-14 NOTE — Telephone Encounter (Signed)
PA initiated via Covermymeds; KEY: [id number]. Received real-time PA approval.   BJYNWG:[id number];MVHQIO:NGEXBMWUCaseId:[id number];Status:Approved;Review Type:Prior Auth;Coverage Start Date:10/15/2017;Coverage End Date:11/14/2018;

## 2017-11-18 ENCOUNTER — Encounter: Payer: Self-pay | Admitting: Family Medicine

## 2017-11-18 ENCOUNTER — Ambulatory Visit (INDEPENDENT_AMBULATORY_CARE_PROVIDER_SITE_OTHER): Payer: Managed Care, Other (non HMO) | Admitting: Family Medicine

## 2017-11-18 VITALS — BP 136/80 | HR 65 | Temp 98.2°F | Resp 16 | Ht 67.0 in | Wt 177.8 lb

## 2017-11-18 DIAGNOSIS — Z0000 Encounter for general adult medical examination without abnormal findings: Secondary | ICD-10-CM

## 2017-11-18 DIAGNOSIS — I10 Essential (primary) hypertension: Secondary | ICD-10-CM | POA: Diagnosis not present

## 2017-11-18 DIAGNOSIS — M810 Age-related osteoporosis without current pathological fracture: Secondary | ICD-10-CM

## 2017-11-18 DIAGNOSIS — E785 Hyperlipidemia, unspecified: Secondary | ICD-10-CM | POA: Diagnosis not present

## 2017-11-18 DIAGNOSIS — N898 Other specified noninflammatory disorders of vagina: Secondary | ICD-10-CM | POA: Diagnosis not present

## 2017-11-18 DIAGNOSIS — Z79899 Other long term (current) drug therapy: Secondary | ICD-10-CM | POA: Diagnosis not present

## 2017-11-18 NOTE — Patient Instructions (Signed)
Preventive Care 40-64 Years, Female Preventive care refers to lifestyle choices and visits with your health care provider that can promote health and wellness. What does preventive care include?  A yearly physical exam. This is also called an annual well check.  Dental exams once or twice a year.  Routine eye exams. Ask your health care provider how often you should have your eyes checked.  Personal lifestyle choices, including: ? Daily care of your teeth and gums. ? Regular physical activity. ? Eating a healthy diet. ? Avoiding tobacco and drug use. ? Limiting alcohol use. ? Practicing safe sex. ? Taking low-dose aspirin daily starting at age 58. ? Taking vitamin and mineral supplements as recommended by your health care provider. What happens during an annual well check? The services and screenings done by your health care provider during your annual well check will depend on your age, overall health, lifestyle risk factors, and family history of disease. Counseling Your health care provider may ask you questions about your:  Alcohol use.  Tobacco use.  Drug use.  Emotional well-being.  Home and relationship well-being.  Sexual activity.  Eating habits.  Work and work Statistician.  Method of birth control.  Menstrual cycle.  Pregnancy history.  Screening You may have the following tests or measurements:  Height, weight, and BMI.  Blood pressure.  Lipid and cholesterol levels. These may be checked every 5 years, or more frequently if you are over 81 years old.  Skin check.  Lung cancer screening. You may have this screening every year starting at age 78 if you have a 30-pack-year history of smoking and currently smoke or have quit within the past 15 years.  Fecal occult blood test (FOBT) of the stool. You may have this test every year starting at age 65.  Flexible sigmoidoscopy or colonoscopy. You may have a sigmoidoscopy every 5 years or a colonoscopy  every 10 years starting at age 30.  Hepatitis C blood test.  Hepatitis B blood test.  Sexually transmitted disease (STD) testing.  Diabetes screening. This is done by checking your blood sugar (glucose) after you have not eaten for a while (fasting). You may have this done every 1-3 years.  Mammogram. This may be done every 1-2 years. Talk to your health care provider about when you should start having regular mammograms. This may depend on whether you have a family history of breast cancer.  BRCA-related cancer screening. This may be done if you have a family history of breast, ovarian, tubal, or peritoneal cancers.  Pelvic exam and Pap test. This may be done every 3 years starting at age 80. Starting at age 36, this may be done every 5 years if you have a Pap test in combination with an HPV test.  Bone density scan. This is done to screen for osteoporosis. You may have this scan if you are at high risk for osteoporosis.  Discuss your test results, treatment options, and if necessary, the need for more tests with your health care provider. Vaccines Your health care provider may recommend certain vaccines, such as:  Influenza vaccine. This is recommended every year.  Tetanus, diphtheria, and acellular pertussis (Tdap, Td) vaccine. You may need a Td booster every 10 years.  Varicella vaccine. You may need this if you have not been vaccinated.  Zoster vaccine. You may need this after age 5.  Measles, mumps, and rubella (MMR) vaccine. You may need at least one dose of MMR if you were born in  1958 or later. You may also need a second dose.  Pneumococcal 13-valent conjugate (PCV13) vaccine. You may need this if you have certain conditions and were not previously vaccinated.  Pneumococcal polysaccharide (PPSV23) vaccine. You may need one or two doses if you smoke cigarettes or if you have certain conditions.  Meningococcal vaccine. You may need this if you have certain  conditions.  Hepatitis A vaccine. You may need this if you have certain conditions or if you travel or work in places where you may be exposed to hepatitis A.  Hepatitis B vaccine. You may need this if you have certain conditions or if you travel or work in places where you may be exposed to hepatitis B.  Haemophilus influenzae type b (Hib) vaccine. You may need this if you have certain conditions.  Talk to your health care provider about which screenings and vaccines you need and how often you need them. This information is not intended to replace advice given to you by your health care provider. Make sure you discuss any questions you have with your health care provider. Document Released: 09/08/2015 Document Revised: 05/01/2016 Document Reviewed: 06/13/2015 Elsevier Interactive Patient Education  2018 Elsevier Inc.  

## 2017-11-18 NOTE — Progress Notes (Signed)
Subjective:     Kathryn Boyer is a 62 y.o. female and is here for a comprehensive physical exam. The patient reports no problems.  Social History   Socioeconomic History  . Marital status: Married    Spouse name: Not on file  . Number of children: Not on file  . Years of education: Not on file  . Highest education level: Not on file  Occupational History  . Not on file  Social Needs  . Financial resource strain: Not on file  . Food insecurity:    Worry: Not on file    Inability: Not on file  . Transportation needs:    Medical: Not on file    Non-medical: Not on file  Tobacco Use  . Smoking status: Never Smoker  . Smokeless tobacco: Never Used  Substance and Sexual Activity  . Alcohol use: No  . Drug use: No  . Sexual activity: Not on file  Lifestyle  . Physical activity:    Days per week: Not on file    Minutes per session: Not on file  . Stress: Not on file  Relationships  . Social connections:    Talks on phone: Not on file    Gets together: Not on file    Attends religious service: Not on file    Active member of club or organization: Not on file    Attends meetings of clubs or organizations: Not on file    Relationship status: Not on file  . Intimate partner violence:    Fear of current or ex partner: Not on file    Emotionally abused: Not on file    Physically abused: Not on file    Forced sexual activity: Not on file  Other Topics Concern  . Not on file  Social History Narrative   Exercise-sometimes   Health Maintenance  Topic Date Due  . HIV Screening  04/14/1971  . MAMMOGRAM  10/10/2017  . COLONOSCOPY  06/05/2025  . TETANUS/TDAP  09/07/2025  . INFLUENZA VACCINE  Completed  . Hepatitis C Screening  Completed    The following portions of the patient's history were reviewed and updated as appropriate:  She  has a past medical history of Anxiety, Hyperlipidemia, Hypertension, Plantar fasciitis, and Thyroid disease. She does not have any pertinent  problems on file. She  has a past surgical history that includes Abdominal hysterectomy; Nose surgery; and Breast surgery. Her family history includes Aneurysm in her father; Cancer in her mother; Heart disease in her father, mother, and sister; Hypertension in her father and mother; Kidney disease in her father; Stroke in her mother. She  reports that she has never smoked. She has never used smokeless tobacco. She reports that she does not drink alcohol or use drugs. She has a current medication list which includes the following prescription(s): alprazolam, cyclobenzaprine, flunisolide, ipratropium, levothyroxine, losartan, meloxicam, montelukast, pitavastatin calcium, and vitamin d (ergocalciferol). Current Outpatient Medications on File Prior to Visit  Medication Sig Dispense Refill  . ALPRAZolam (XANAX) 0.25 MG tablet Take 1 tablet (0.25 mg total) by mouth at bedtime as needed for anxiety. 30 tablet 0  . cyclobenzaprine (FLEXERIL) 5 MG tablet Take 1 tablet (5 mg total) by mouth 3 (three) times daily as needed for muscle spasms. 30 tablet 1  . flunisolide (NASALIDE) 25 MCG/ACT (0.025%) SOLN Place 2 sprays into the nose 2 (two) times daily. 1 Bottle 5  . ipratropium (ATROVENT) 0.06 % nasal spray USE 1 SPRAY BY NASAL ROUTE 2 TIMES  DAILY.  0  . levothyroxine (SYNTHROID, LEVOTHROID) 100 MCG tablet TAKE 1 TABLET BY MOUTH EVERY DAY BEFORE BREAKFAST 90 tablet 1  . losartan (COZAAR) 100 MG tablet TAKE 1 TABLET BY MOUTH EVERY DAY 90 tablet 1  . meloxicam (MOBIC) 15 MG tablet TAKE 1/2 TO 1 TABLET BY MOUTH ONCE A DAY 30 tablet 0  . montelukast (SINGULAIR) 10 MG tablet Take 1 tablet (10 mg total) by mouth daily. 30 tablet 5  . Pitavastatin Calcium (LIVALO) 4 MG TABS Take 1 tablet (4 mg total) by mouth daily. 90 tablet 1  . Vitamin D, Ergocalciferol, (DRISDOL) 50000 units CAPS capsule TAKE 1 CAPSULE (50,000 UNITS TOTAL) BY MOUTH EVERY 7 (SEVEN) DAYS. 12 capsule 1   No current facility-administered  medications on file prior to visit.    She is allergic to isovue [iopamidol]; belviq xr [lorcaserin hcl er]; saxenda [liraglutide -weight management]; and penicillins..  Review of Systems Review of Systems  Constitutional: Negative for activity change, appetite change and fatigue.  HENT: Negative for hearing loss, congestion, tinnitus and ear discharge.  dentist [id number] Eyes: Negative for visual disturbance (see optho q1y -- vision corrected to 20/20 with glasses).  Respiratory: Negative for cough, chest tightness and shortness of breath.   Cardiovascular: Negative for chest pain, palpitations and leg swelling.  Gastrointestinal: Negative for abdominal pain, diarrhea, constipation and abdominal distention.  Genitourinary: Negative for urgency, frequency, decreased urine volume and difficulty urinating.  Musculoskeletal: Negative for back pain, arthralgias and gait problem.  Skin: Negative for color change, pallor and rash.  Neurological: Negative for dizziness, light-headedness, numbness and headaches.  Hematological: Negative for adenopathy. Does not bruise/bleed easily.  Psychiatric/Behavioral: Negative for suicidal ideas, confusion, sleep disturbance, self-injury, dysphoric mood, decreased concentration and agitation.      Objective:    BP 136/80 (BP Location: Left Arm, Cuff Size: Normal)   Pulse 65   Temp 98.2 F (36.8 C) (Oral)   Resp 16   Ht 5\' 7"  (1.702 m)   Wt 177 lb 12.8 oz (80.6 kg)   SpO2 98%   BMI 27.85 kg/m  General appearance: alert, cooperative, appears stated age and no distress Head: Normocephalic, without obvious abnormality, atraumatic Eyes: conjunctivae/corneas clear. PERRL, EOM's intact. Fundi benign. Ears: normal TM's and external ear canals both ears Nose: Nares normal. Septum midline. Mucosa normal. No drainage or sinus tenderness. Throat: lips, mucosa, and tongue normal; teeth and gums normal Neck: no adenopathy, no carotid bruit, no JVD, supple,  symmetrical, trachea midline and thyroid not enlarged, symmetric, no tenderness/mass/nodules Back: symmetric, no curvature. ROM normal. No CVA tenderness. Lungs: clear to auscultation bilaterally Breasts: pt preferred not to have done Heart: regular rate and rhythm, S1, S2 normal, no murmur, click, rub or gallop Abdomen: soft, non-tender; bowel sounds normal; no masses,  no organomegaly Pelvic: not indicated; status post hysterectomy, negative ROS Extremities: extremities normal, atraumatic, no cyanosis or edema Pulses: 2+ and symmetric Skin: Skin color, texture, turgor normal. No rashes or lesions Lymph nodes: Cervical, supraclavicular, and axillary nodes normal. Neurologic: Alert and oriented X 3, normal strength and tone. Normal symmetric reflexes. Normal coordination and gait    Assessment:    Healthy female exam.      Plan:     See After Visit Summary for Counseling Recommendations   ghm utd Check labs We discussed shingrix  1. High risk medication use On prn xanax - Pain Mgmt, Profile 8 w/Conf, U  2. Essential hypertension Well controlled, no changes to meds. Encouraged heart  healthy diet such as the DASH diet and exercise as tolerated.   3. Osteoporosis without current pathological fracture, unspecified osteoporosis type Pt would like to try prolia  4. Hyperlipidemia LDL goal <100 Tolerating statin, encouraged heart healthy diet, avoid trans fats, minimize simple carbs and saturated fats. Increase exercise as tolerated  5. Vaginal dryness Pt needs refill on estrogen cream

## 2017-11-19 NOTE — Assessment & Plan Note (Addendum)
Tolerating statin, encouraged heart healthy diet, avoid trans fats, minimize simple carbs and saturated fats. Increase exercise as tolerated. Labs reviewed 

## 2017-11-19 NOTE — Assessment & Plan Note (Signed)
Pt would like to start prolia.

## 2017-11-19 NOTE — Assessment & Plan Note (Signed)
Renew estrogen cream

## 2017-11-19 NOTE — Assessment & Plan Note (Signed)
Well controlled, no changes to meds. Encouraged heart healthy diet such as the DASH diet and exercise as tolerated.  °

## 2017-11-20 LAB — PAIN MGMT, PROFILE 8 W/CONF, U
6 Acetylmorphine: NEGATIVE ng/mL (ref <10)
ALCOHOL METABOLITES: NEGATIVE ng/mL (ref <500)
AMINOCLONAZEPAM: NEGATIVE ng/mL (ref <25)
AMPHETAMINES: NEGATIVE ng/mL (ref <500)
Alphahydroxyalprazolam: 52 ng/mL — ABNORMAL HIGH (ref <25)
Alphahydroxymidazolam: NEGATIVE ng/mL (ref <50)
Alphahydroxytriazolam: NEGATIVE ng/mL (ref <50)
Benzodiazepines: POSITIVE ng/mL — AB (ref <100)
Buprenorphine, Urine: NEGATIVE ng/mL (ref <5)
COCAINE METABOLITE: NEGATIVE ng/mL (ref <150)
CREATININE: 71.1 mg/dL
Hydroxyethylflurazepam: NEGATIVE ng/mL (ref <50)
LORAZEPAM: NEGATIVE ng/mL (ref <50)
MDMA: NEGATIVE ng/mL (ref <500)
Marijuana Metabolite: NEGATIVE ng/mL (ref <20)
Nordiazepam: NEGATIVE ng/mL (ref <50)
OXIDANT: NEGATIVE ug/mL (ref <200)
Opiates: NEGATIVE ng/mL (ref <100)
Oxazepam: NEGATIVE ng/mL (ref <50)
Oxycodone: NEGATIVE ng/mL (ref <100)
PH: 5.65 (ref 4.5–9.0)
Temazepam: NEGATIVE ng/mL (ref <50)

## 2017-12-02 ENCOUNTER — Telehealth: Payer: Self-pay | Admitting: Family Medicine

## 2017-12-02 NOTE — Telephone Encounter (Signed)
Prolia benefits received PA is required and is on file #[id number] 09/17/17-09/17/2018 $45 copay for Prolia and admin fee    Patient may owe approximately $45 OOP   Letter mailed to inform patient of benefits and to schedule

## 2017-12-16 ENCOUNTER — Telehealth: Payer: Self-pay | Admitting: Family Medicine

## 2017-12-16 ENCOUNTER — Other Ambulatory Visit: Payer: Self-pay | Admitting: Family Medicine

## 2017-12-16 NOTE — Telephone Encounter (Signed)
Copied from CRM [phone]. Topic: Quick Communication - Rx Refill/Question >> Dec 16, 2017  1:46 PM Cipriano BunkerLambe, Annette S wrote:  Medication: levothyroxine (SYNTHROID, LEVOTHROID) 100 MCG tablet Also was to have a estrogen cream (not sure of name)   All Medication has to be [facility]  Unless emergency  Has the patient contacted their pharmacy? No.  (Agent: If no, request that the patient contact the pharmacy for the refill.) Preferred Pharmacy (with phone number or street name):   [facility] [place] [address] [place] [id number] Phone: [phone] Fax: [phone]   Agent: Please be advised that RX refills may take up to 3 business days. We ask that you follow-up with your pharmacy.

## 2017-12-17 ENCOUNTER — Ambulatory Visit (INDEPENDENT_AMBULATORY_CARE_PROVIDER_SITE_OTHER): Payer: Managed Care, Other (non HMO) | Admitting: *Deleted

## 2017-12-17 DIAGNOSIS — M810 Age-related osteoporosis without current pathological fracture: Secondary | ICD-10-CM | POA: Diagnosis not present

## 2017-12-17 MED ORDER — DENOSUMAB 60 MG/ML ~~LOC~~ SOSY
60.0000 mg | PREFILLED_SYRINGE | Freq: Once | SUBCUTANEOUS | Status: AC
  Administered 2017-12-17: 60 mg via SUBCUTANEOUS

## 2017-12-17 NOTE — Progress Notes (Signed)
Pre visit review using our clinic review tool, if applicable. No additional management support is needed unless otherwise documented below in the visit note.  Pt here for Prolia injection per Dr Zola ButtonLowne Chase.  Pt anxious about injection today and potential side effects of medication.  Printout given to pt from [employer].  Prolia 60mg  given SQ, right arm. Pt was observed in the office for 20 minutes and tolerated procedure well.   Signs of potential life threatening reactions discussed with pt that would prompt 911 call. Pt voices understanding.  Pt will be contacted in 6 months when next Prolia injection is due.  Office note routed to covering Provider Ambulance person([place]) in PCP's absence.

## 2017-12-17 NOTE — Telephone Encounter (Signed)
Pt requesting estrogen cream but is unsure of the name of the medication. Refilling the medication was addressed during visit on 11/18/17 with Dr. Shaune PollackLown-Chase.   LOV: 11/18/17  PCP: Dr. Zola ButtonLowne-Chase  [facility]  Refill of synthroid addressed previously.

## 2017-12-18 MED ORDER — LEVOTHYROXINE SODIUM 100 MCG PO TABS: ORAL_TABLET | ORAL | 1 refills | Status: DC

## 2017-12-18 NOTE — Telephone Encounter (Signed)
Synthroid sent in.    For the estrogen cream, it was last filled in 2017.  How do you want the sig written.

## 2017-12-18 NOTE — Telephone Encounter (Signed)
1 applicator pv daily

## 2017-12-22 MED ORDER — ESTROGENS, CONJUGATED 0.625 MG/GM VA CREA: 1.0000 | TOPICAL_CREAM | Freq: Every day | VAGINAL | 1 refills | Status: DC

## 2017-12-22 MED ORDER — ESTRADIOL 0.1 MG/GM VA CREA: 1.0000 | TOPICAL_CREAM | Freq: Every day | VAGINAL | 12 refills | Status: DC

## 2017-12-22 NOTE — Telephone Encounter (Signed)
rx sent in for estrace  

## 2017-12-23 NOTE — Telephone Encounter (Signed)
Patient notified yesterday.

## 2017-12-24 ENCOUNTER — Other Ambulatory Visit: Payer: Self-pay | Admitting: *Deleted

## 2017-12-24 MED ORDER — ESTRADIOL 0.1 MG/GM VA CREA: 1.0000 | TOPICAL_CREAM | Freq: Every day | VAGINAL | 0 refills | Status: DC

## 2017-12-25 ENCOUNTER — Other Ambulatory Visit: Payer: Self-pay | Admitting: *Deleted

## 2017-12-25 MED ORDER — ESTRADIOL 0.1 MG/GM VA CREA: 1.0000 | TOPICAL_CREAM | VAGINAL | 3 refills | Status: DC

## 2017-12-25 MED ORDER — ESTRADIOL 0.1 MG/GM VA CREA: 1.0000 | TOPICAL_CREAM | VAGINAL | 0 refills | Status: DC

## 2018-01-09 ENCOUNTER — Other Ambulatory Visit: Payer: Self-pay | Admitting: Family Medicine

## 2018-02-15 ENCOUNTER — Other Ambulatory Visit: Payer: Self-pay | Admitting: Family Medicine

## 2018-02-15 DIAGNOSIS — E785 Hyperlipidemia, unspecified: Secondary | ICD-10-CM

## 2018-03-08 ENCOUNTER — Other Ambulatory Visit: Payer: Self-pay | Admitting: Family Medicine

## 2018-03-21 ENCOUNTER — Other Ambulatory Visit: Payer: Self-pay | Admitting: Family Medicine

## 2018-05-21 ENCOUNTER — Telehealth: Payer: Self-pay | Admitting: Family Medicine

## 2018-05-21 NOTE — Telephone Encounter (Signed)
Prolia benefits received PA is required-On file thru 09/17/17 #[id number] $25 copay covers admin and Prolia   Patient may owe approximately $25 OOP  Patient due after 10.21.19  Letter mailed to inform patient of benefits and to schedule

## 2018-05-25 ENCOUNTER — Ambulatory Visit (INDEPENDENT_AMBULATORY_CARE_PROVIDER_SITE_OTHER): Payer: Managed Care, Other (non HMO) | Admitting: Family Medicine

## 2018-05-25 ENCOUNTER — Telehealth: Payer: Self-pay | Admitting: Family Medicine

## 2018-05-25 ENCOUNTER — Encounter: Payer: Self-pay | Admitting: Family Medicine

## 2018-05-25 ENCOUNTER — Ambulatory Visit (HOSPITAL_BASED_OUTPATIENT_CLINIC_OR_DEPARTMENT_OTHER)
Admission: RE | Admit: 2018-05-25 | Discharge: 2018-05-25 | Disposition: A | Discharge status: Home / Self Care | Payer: Managed Care, Other (non HMO) | Source: Ambulatory Visit | Attending: Family Medicine | Admitting: Family Medicine

## 2018-05-25 DIAGNOSIS — I700 Atherosclerosis of aorta: Secondary | ICD-10-CM | POA: Insufficient documentation

## 2018-05-25 DIAGNOSIS — I10 Essential (primary) hypertension: Secondary | ICD-10-CM

## 2018-05-25 DIAGNOSIS — E785 Hyperlipidemia, unspecified: Secondary | ICD-10-CM

## 2018-05-25 DIAGNOSIS — G8929 Other chronic pain: Secondary | ICD-10-CM

## 2018-05-25 DIAGNOSIS — M25561 Pain in right knee: Secondary | ICD-10-CM

## 2018-05-25 DIAGNOSIS — R110 Nausea: Secondary | ICD-10-CM | POA: Diagnosis not present

## 2018-05-25 DIAGNOSIS — F411 Generalized anxiety disorder: Secondary | ICD-10-CM

## 2018-05-25 DIAGNOSIS — E039 Hypothyroidism, unspecified: Secondary | ICD-10-CM

## 2018-05-25 DIAGNOSIS — M545 Low back pain, unspecified: Secondary | ICD-10-CM

## 2018-05-25 DIAGNOSIS — M791 Myalgia, unspecified site: Secondary | ICD-10-CM

## 2018-05-25 DIAGNOSIS — M810 Age-related osteoporosis without current pathological fracture: Secondary | ICD-10-CM

## 2018-05-25 DIAGNOSIS — R0609 Other forms of dyspnea: Secondary | ICD-10-CM

## 2018-05-25 DIAGNOSIS — F4322 Adjustment disorder with anxiety: Secondary | ICD-10-CM

## 2018-05-25 HISTORY — DX: Pain in right knee: M25.561

## 2018-05-25 HISTORY — DX: Atherosclerosis of aorta: I70.0

## 2018-05-25 HISTORY — DX: Low back pain, unspecified: M54.50

## 2018-05-25 LAB — COMPREHENSIVE METABOLIC PANEL
ALBUMIN: 4.6 g/dL (ref 3.5–5.2)
ALK PHOS: 64 U/L (ref 39–117)
ALT: 34 U/L (ref 0–35)
AST: 31 U/L (ref 0–37)
BUN: 12 mg/dL (ref 6–23)
CHLORIDE: 104 meq/L (ref 96–112)
CO2: 28 meq/L (ref 19–32)
Calcium: 9.8 mg/dL (ref 8.4–10.5)
Creatinine, Ser: 0.86 mg/dL (ref 0.40–1.20)
GFR: 70.81 mL/min (ref >60.00)
Glucose, Bld: 83 mg/dL (ref 70–99)
POTASSIUM: 4.4 meq/L (ref 3.5–5.1)
SODIUM: 140 meq/L (ref 135–145)
Total Bilirubin: 0.6 mg/dL (ref 0.2–1.2)
Total Protein: 7.2 g/dL (ref 6.0–8.3)

## 2018-05-25 LAB — LIPID PANEL
Cholesterol: 161 mg/dL (ref 0–200)
HDL: 48.6 mg/dL (ref >39.00)
LDL CALC: 93 mg/dL (ref 0–99)
NONHDL: 112.33
Total CHOL/HDL Ratio: 3
Triglycerides: 96 mg/dL (ref 0.0–149.0)
VLDL: 19.2 mg/dL (ref 0.0–40.0)

## 2018-05-25 LAB — CK: CK TOTAL: 214 U/L — AB (ref 7–177)

## 2018-05-25 LAB — VITAMIN D 25 HYDROXY (VIT D DEFICIENCY, FRACTURES): VITD: 46.75 ng/mL (ref 30.00–100.00)

## 2018-05-25 LAB — SEDIMENTATION RATE: SED RATE: 1 mm/h (ref 0–30)

## 2018-05-25 MED ORDER — ALPRAZOLAM 0.25 MG PO TABS: 0.2500 mg | ORAL_TABLET | Freq: Every evening | ORAL | 0 refills | Status: DC | PRN

## 2018-05-25 MED ORDER — RISEDRONATE SODIUM 35 MG PO TABS: 35.0000 mg | ORAL_TABLET | ORAL | 1 refills | Status: DC

## 2018-05-25 MED ORDER — CYCLOBENZAPRINE HCL 5 MG PO TABS: 5.0000 mg | ORAL_TABLET | Freq: Three times a day (TID) | ORAL | 1 refills | Status: DC | PRN

## 2018-05-25 NOTE — Telephone Encounter (Signed)
Copied from CRM [id number]. Topic: Quick Communication - See Telephone Encounter >> May 25, 2018  2:01 PM Jens Som A wrote: CRM for notification. See Telephone encounter for: 05/25/18. Patient had an x ray and missed a call from the office no voicemail was left please advise [phone]

## 2018-05-25 NOTE — Assessment & Plan Note (Signed)
Encouraged heart healthy diet, increase exercise, avoid trans fats, consider a krill oil cap daily livalo may be causing myalgias but we will wait until cardiac w/u is done to do trial off of it

## 2018-05-25 NOTE — Assessment & Plan Note (Signed)
Check MRI May need f/u Dr Shon Baton

## 2018-05-25 NOTE — Patient Instructions (Signed)

## 2018-05-25 NOTE — Assessment & Plan Note (Signed)
Well controlled, no changes to meds. Encouraged heart healthy diet such as the DASH diet and exercise as tolerated.  °

## 2018-05-25 NOTE — Assessment & Plan Note (Signed)
Aspirin 81mg daily

## 2018-05-25 NOTE — Assessment & Plan Note (Signed)
Refill xanax Symptoms stable

## 2018-05-25 NOTE — Telephone Encounter (Signed)
Patient does not want another injection, she thinks that it caused her joint pain.

## 2018-05-25 NOTE — Assessment & Plan Note (Signed)
Recheck labs today. 

## 2018-05-25 NOTE — Assessment & Plan Note (Signed)
D/c prolia Go back on actonel--- if no improvement -- consider reclast--- pt did not want to do that now

## 2018-05-25 NOTE — Progress Notes (Signed)
Patient ID: Kathryn Boyer, female    DOB: [birth year]  Age: 62 y.o. MRN: [id number]    Subjective:  Subjective  HPI Kathryn Boyer presents for multiple concerns.  The prolia caused muscle aches--- she would like to go back on actonel weekly.  She also c/o con't myalgias , knee pain -- her knee cap pops out.  She had an appointment for a stem cell injection but cancelled it because her brother in [place] told her it was a scam.  Her back pain is worsening and radiates down R leg to foot and sometimes L leg.  She had seen Dr Shon Baton for this several months ago but it has worsened since.   Review of Systems  Constitutional: Negative for chills and fever.  HENT: Negative for congestion and hearing loss.   Eyes: Negative for discharge.  Respiratory: Negative for cough and shortness of breath.   Cardiovascular: Negative for chest pain, palpitations and leg swelling.  Gastrointestinal: Negative for abdominal pain, blood in stool, constipation, diarrhea, nausea and vomiting.  Genitourinary: Negative for dysuria, frequency, hematuria and urgency.  Musculoskeletal: Positive for myalgias. Negative for back pain.  Skin: Negative for rash.  Allergic/Immunologic: Negative for environmental allergies.  Neurological: Negative for dizziness, weakness and headaches.  Hematological: Does not bruise/bleed easily.  Psychiatric/Behavioral: Negative for suicidal ideas. The patient is not nervous/anxious.     History Past Medical History:  Diagnosis Date  . Anxiety   . Hyperlipidemia   . Hypertension   . Plantar fasciitis   . Thyroid disease     She has a past surgical history that includes Abdominal hysterectomy; Nose surgery; and Breast surgery.   Her family history includes Aneurysm in her father; Cancer in her mother; Heart disease in her father, mother, and sister; Hypertension in her father and mother; Kidney disease in her father; Stroke in her mother.She reports that she has never smoked. She has never  used smokeless tobacco. She reports that she does not drink alcohol or use drugs.  Current Outpatient Medications on File Prior to Visit  Medication Sig Dispense Refill  . estradiol (ESTRACE) 0.1 MG/GM vaginal cream Place 1 Applicatorful vaginally 3 (three) times a week. 42.5 g 3  . flunisolide (NASALIDE) 25 MCG/ACT (0.025%) SOLN Place 2 sprays into the nose 2 (two) times daily. 1 Bottle 5  . ipratropium (ATROVENT) 0.06 % nasal spray USE 1 SPRAY BY NASAL ROUTE 2 TIMES DAILY.  0  . levothyroxine (SYNTHROID, LEVOTHROID) 100 MCG tablet TAKE 1 TABLET BY MOUTH EVERY DAY BEFORE BREAKFAST 90 tablet 1  . levothyroxine (SYNTHROID, LEVOTHROID) 100 MCG tablet TAKE 1 TABLET BY MOUTH EVERY DAY BEFORE BREAKFAST 30 tablet 0  . losartan (COZAAR) 100 MG tablet TAKE 1 TABLET BY MOUTH EVERY DAY 90 tablet 1  . meloxicam (MOBIC) 15 MG tablet TAKE 1/2 TO 1 TABLET BY MOUTH ONCE A DAY 30 tablet 0  . montelukast (SINGULAIR) 10 MG tablet Take 1 tablet (10 mg total) by mouth daily. 30 tablet 5  . Pitavastatin Calcium (LIVALO) 4 MG TABS Take 1 tablet (4 mg total) by mouth daily. 90 tablet 1  . Vitamin D, Ergocalciferol, (DRISDOL) 50000 units CAPS capsule TAKE 1 CAPSULE (50,000 UNITS TOTAL) BY MOUTH EVERY 7 (SEVEN) DAYS. 12 capsule 1   No current facility-administered medications on file prior to visit.      Objective:  Objective  Physical Exam  Constitutional: She is oriented to person, place, and time. She appears well-developed and well-nourished.  HENT:  Head: Normocephalic  and atraumatic.  Eyes: Conjunctivae and EOM are normal.  Neck: Normal range of motion. Neck supple. No JVD present. Carotid bruit is not present. No thyromegaly present.  Cardiovascular: Normal rate, regular rhythm and normal heart sounds.  No murmur heard. Pulmonary/Chest: Effort normal and breath sounds normal. No respiratory distress. She has no wheezes. She has no rales. She exhibits no tenderness.  Musculoskeletal: She exhibits no edema.   Neurological: She is alert and oriented to person, place, and time.  Psychiatric: She has a normal mood and affect.  Nursing note and vitals reviewed.  There were no vitals taken for this visit. Wt Readings from Last 3 Encounters:  11/18/17 177 lb 12.8 oz (80.6 kg)  09/04/17 180 lb 9.6 oz (81.9 kg)  03/20/17 181 lb 12.8 oz (82.5 kg)     Lab Results  Component Value Date   WBC 7.3 09/22/2017   HGB 15.5 (H) 09/22/2017   HCT 46.3 (H) 09/22/2017   PLT 274.0 09/22/2017   GLUCOSE 105 (H) 09/22/2017   CHOL 162 09/22/2017   TRIG 104.0 09/22/2017   HDL 56.80 09/22/2017   LDLCALC 84 09/22/2017   ALT 20 09/22/2017   AST 22 09/22/2017   NA 141 09/22/2017   K 4.8 09/22/2017   CL 105 09/22/2017   CREATININE 1.00 09/22/2017   BUN 13 09/22/2017   CO2 27 09/22/2017   TSH 0.81 09/22/2017    Ct Abdomen Pelvis W Contrast  Result Date: 09/06/2017 CLINICAL DATA:  Right-sided abdominal pain, right lower quadrant pain EXAM: CT ABDOMEN AND PELVIS WITH CONTRAST TECHNIQUE: Multidetector CT imaging of the abdomen and pelvis was performed using the standard protocol following bolus administration of intravenous contrast. CONTRAST:  ISOVUE-300 IOPAMIDOL (ISOVUE-300) INJECTION 61% COMPARISON:  07/25/2015 FINDINGS: Lower chest: Partially visible breast implants. Normal heart size. Lung bases clear. Hepatobiliary: Probable geographic fatty infiltration of the liver, similar compared to prior CT. No focal hepatic abnormality, calcified gallstones or biliary dilatation Pancreas: Unremarkable. No pancreatic ductal dilatation or surrounding inflammatory changes. Spleen: Normal in size without focal abnormality. Adrenals/Urinary Tract: Adrenal glands are unremarkable. Kidneys are normal, without renal calculi, focal lesion, or hydronephrosis. Bladder is unremarkable. Stomach/Bowel: Stomach is within normal limits. Appendix appears normal. No evidence of bowel wall thickening, distention, or inflammatory  changes. Sigmoid colon diverticular disease without acute inflammation Vascular/Lymphatic: Aortic atherosclerosis. No enlarged abdominal or pelvic lymph nodes. Reproductive: Status post hysterectomy. No adnexal masses. Other: Negative for free air or free fluid. Small fat in the umbilicus. Musculoskeletal: No acute or significant osseous findings. IMPRESSION: 1. No CT evidence for acute intra-abdominal or pelvic abnormality. Negative appendix 2. Probable geographic fatty infiltration of the liver 3. Sigmoid colon diverticular disease without acute inflammation. Electronically Signed   By: Jasmine Pang M.D.   On: 09/06/2017 19:38     Assessment & Plan:  Plan  I have discontinued Latima Ellwanger's LIVALO. I am also having her start on risedronate. Additionally, I am having her maintain her montelukast, flunisolide, Pitavastatin Calcium, ipratropium, meloxicam, levothyroxine, estradiol, levothyroxine, losartan, Vitamin D (Ergocalciferol), cyclobenzaprine, and ALPRAZolam.  Meds ordered this encounter  Medications  . cyclobenzaprine (FLEXERIL) 5 MG tablet    Sig: Take 1 tablet (5 mg total) by mouth 3 (three) times daily as needed for muscle spasms.    Dispense:  30 tablet    Refill:  1  . DISCONTD: ALPRAZolam (XANAX) 0.25 MG tablet    Sig: Take 1 tablet (0.25 mg total) by mouth at bedtime as needed for anxiety.  Dispense:  30 tablet    Refill:  0  . risedronate (ACTONEL) 35 MG tablet    Sig: Take 1 tablet (35 mg total) by mouth every 7 (seven) days. with water on empty stomach, nothing by mouth or lie down for next 30 minutes.    Dispense:  12 tablet    Refill:  1  . ALPRAZolam (XANAX) 0.25 MG tablet    Sig: Take 1 tablet (0.25 mg total) by mouth at bedtime as needed for anxiety.    Dispense:  30 tablet    Refill:  0    Problem List Items Addressed This Visit      Unprioritized   Acute pain of right knee    Refer to Dr Katrinka Blazing -- sport med      Relevant Orders   Ambulatory referral to  Sports Medicine   Adjustment disorder with anxiety    Refill xanax Symptoms stable      Chronic low back pain    Check MRI May need f/u Dr Shon Baton       Relevant Medications   cyclobenzaprine (FLEXERIL) 5 MG tablet   Other Relevant Orders   MR Lumbar Spine Wo Contrast   Essential hypertension    Well controlled, no changes to meds. Encouraged heart healthy diet such as the DASH diet and exercise as tolerated.       Generalized anxiety disorder   Relevant Medications   ALPRAZolam (XANAX) 0.25 MG tablet   Hyperlipidemia LDL goal <100    Encouraged heart healthy diet, increase exercise, avoid trans fats, consider a krill oil cap daily livalo may be causing myalgias but we will wait until cardiac w/u is done to do trial off of it       Hypothyroidism    Recheck labs today      Relevant Orders   Thyroid Panel With TSH   Lipid panel   Comprehensive metabolic panel   Thyroid stimulating immunoglobulin   Osteoporosis - Primary    D/c prolia Go back on actonel--- if no improvement -- consider reclast--- pt did not want to do that now       Relevant Medications   risedronate (ACTONEL) 35 MG tablet   Other Relevant Orders   Vitamin D (25 hydroxy)    Other Visit Diagnoses    Nausea       Relevant Orders   Thyroid Panel With TSH   Lipid panel   Comprehensive metabolic panel   Myalgia       Relevant Orders   Thyroid Panel With TSH   Lipid panel   Comprehensive metabolic panel   Antinuclear Antib (ANA)   Rheumatoid Factor   Sedimentation rate   CK   DOE (dyspnea on exertion)       Relevant Orders   EKG 12-Lead (Completed)   ECHOCARDIOGRAM COMPLETE   DG Chest 2 View (Completed)      Follow-up: Return in about 6 months (around 11/23/2018), or if symptoms worsen or fail to improve.  Donato Schultz, DO

## 2018-05-25 NOTE — Assessment & Plan Note (Signed)
Refer to Dr Katrinka Blazing -- sport med

## 2018-05-26 NOTE — Telephone Encounter (Signed)
Patient given xray results.  She was asking about an [place].  Do you know anything about that?

## 2018-05-26 NOTE — Telephone Encounter (Signed)
Echo--- needs to be scheduled

## 2018-05-27 ENCOUNTER — Other Ambulatory Visit (HOSPITAL_COMMUNITY): Payer: Managed Care, Other (non HMO)

## 2018-05-27 ENCOUNTER — Other Ambulatory Visit: Payer: Self-pay | Admitting: *Deleted

## 2018-05-27 DIAGNOSIS — R748 Abnormal levels of other serum enzymes: Secondary | ICD-10-CM

## 2018-05-27 LAB — THYROID PANEL WITH TSH
Free Thyroxine Index: 2.8 (ref 1.4–3.8)
T3 UPTAKE: 30 % (ref 22–35)
T4 TOTAL: 9.3 ug/dL (ref 5.1–11.9)
TSH: 0.88 mIU/L (ref 0.40–4.50)

## 2018-05-27 LAB — THYROID STIMULATING IMMUNOGLOBULIN: TSI: <89 % baseline (ref <140)

## 2018-05-27 LAB — RHEUMATOID FACTOR

## 2018-05-27 LAB — ANA: ANA: NEGATIVE

## 2018-05-27 NOTE — Telephone Encounter (Signed)
Echo order left pended for Dr. Ernst Spell review.

## 2018-05-27 NOTE — Telephone Encounter (Signed)
Patient notified that echo is ordered and just waiting on office to call her for appt.

## 2018-05-29 ENCOUNTER — Other Ambulatory Visit: Payer: Self-pay | Admitting: Family Medicine

## 2018-05-30 ENCOUNTER — Ambulatory Visit (HOSPITAL_BASED_OUTPATIENT_CLINIC_OR_DEPARTMENT_OTHER)
Admission: RE | Admit: 2018-05-30 | Discharge: 2018-05-30 | Disposition: A | Discharge status: Home / Self Care | Payer: Managed Care, Other (non HMO) | Source: Ambulatory Visit | Attending: Family Medicine | Admitting: Family Medicine

## 2018-05-30 DIAGNOSIS — G8929 Other chronic pain: Secondary | ICD-10-CM | POA: Diagnosis present

## 2018-05-30 DIAGNOSIS — M545 Low back pain: Secondary | ICD-10-CM | POA: Diagnosis present

## 2018-05-30 DIAGNOSIS — M48061 Spinal stenosis, lumbar region without neurogenic claudication: Secondary | ICD-10-CM | POA: Insufficient documentation

## 2018-05-30 DIAGNOSIS — M47816 Spondylosis without myelopathy or radiculopathy, lumbar region: Secondary | ICD-10-CM | POA: Diagnosis not present

## 2018-05-30 DIAGNOSIS — M5126 Other intervertebral disc displacement, lumbar region: Secondary | ICD-10-CM | POA: Diagnosis not present

## 2018-05-31 NOTE — Progress Notes (Signed)
Kathryn Boyer - 62 y.o. female MRN [id number]  Date of birth: 04-12-1956  SUBJECTIVE:  Including CC & ROS.  Chief Complaint  Patient presents with  . Knee Pain    Kathryn Boyer is a 62 y.o. female that is presenting with right knee pain and hip pain.     Pain in her right knee is not located in one particular location, diffusely throughout her knee. Admits to a popping sensation intermittently. Denies swelling or tenderness. Pain is worse when she stands for long periods. She has been taking Flexeril with some improvement.  No mechanism of injury.  Has been taking ibuprofen for the pain.  Meloxicam does not seem to work for her.  Pain is worse at the end of the day.  Right hip pain/back pain has been increasing more recently. Pain is worse when she is sitting. Denies tingling or numbness.  Pain is worse on the lateral aspect.  She does some radiation down her leg.  Pain is sharp and stabbing in nature.  Review of her MRI lumbar spine from 10/5 shows spondylosis at L4-5 where there is a disc bulge and ligamentum flavum thickening to cause moderate to severe canal stenosis but no nerve root compression.   Review of Systems  Constitutional: Negative for fever.  HENT: Negative for congestion.   Respiratory: Negative for cough.   Cardiovascular: Negative for chest pain.  Gastrointestinal: Negative for abdominal pain.  Musculoskeletal: Positive for back pain.  Skin: Negative for color change.  Neurological: Negative for weakness.  Hematological: Negative for adenopathy.  Psychiatric/Behavioral: Negative for agitation.    HISTORY: Past Medical, Surgical, Social, and Family History Reviewed & Updated per EMR.   Pertinent Historical Findings include:  Past Medical History:  Diagnosis Date  . Anxiety   . Hyperlipidemia   . Hypertension   . Plantar fasciitis   . Thyroid disease     Past Surgical History:  Procedure Laterality Date  . ABDOMINAL HYSTERECTOMY    . BREAST SURGERY    .  NOSE SURGERY      Allergies  Allergen Reactions  . Contrast Media [Iodinated Diagnostic Agents] Hives  . Isovue [Iopamidol] Hives    After returning home pt called to inform technologist of rash to face and neck, denies difficulty breathing or swallowing, pt took 2 benedryl Will need premeds in future  . Belviq Xr [Lorcaserin Hcl Er] Itching    Caused her face to turn red.  Bernie Covey [Liraglutide -Weight Management] Other (See Comments)    Caused indigestion.  Marland Kitchen Penicillins Rash    Family History  Problem Relation Age of Onset  . Heart disease Mother   . Stroke Mother   . Hypertension Mother   . Cancer Mother        ovarian  . Aneurysm Father   . Hypertension Father   . Heart disease Father   . Kidney disease Father   . Heart disease Sister      Social History   Socioeconomic History  . Marital status: Married    Spouse name: Not on file  . Number of children: Not on file  . Years of education: Not on file  . Highest education level: Not on file  Occupational History  . Not on file  Social Needs  . Financial resource strain: Not on file  . Food insecurity:    Worry: Not on file    Inability: Not on file  . Transportation needs:    Medical: Not on file  Non-medical: Not on file  Tobacco Use  . Smoking status: Never Smoker  . Smokeless tobacco: Never Used  Substance and Sexual Activity  . Alcohol use: No  . Drug use: No  . Sexual activity: Not on file  Lifestyle  . Physical activity:    Days per week: Not on file    Minutes per session: Not on file  . Stress: Not on file  Relationships  . Social connections:    Talks on phone: Not on file    Gets together: Not on file    Attends religious service: Not on file    Active member of club or organization: Not on file    Attends meetings of clubs or organizations: Not on file    Relationship status: Not on file  . Intimate partner violence:    Fear of current or ex partner: Not on file    Emotionally  abused: Not on file    Physically abused: Not on file    Forced sexual activity: Not on file  Other Topics Concern  . Not on file  Social History Narrative   Exercise-sometimes     PHYSICAL EXAM:  VS: BP 122/72   Pulse 72   Ht 5\' 7"  (1.702 m)   SpO2 98%   BMI 27.85 kg/m  Physical Exam Gen: NAD, alert, cooperative with exam, well-appearing ENT: normal lips, normal nasal mucosa,  Eye: normal EOM, normal conjunctiva and lids CV:  no edema, +2 pedal pulses   Resp: no accessory muscle use, non-labored,  Skin: no rashes, no areas of induration  Neuro: normal tone, normal sensation to touch Psych:  normal insight, alert and oriented MSK:  Right knee:  No obvious effusion. No significant tenderness palpation of the quad or patella tendon. No significant tenderness to palpation over the medial lateral joint line. Normal range of motion. Normal strength resistance. No instability. Negative McMurray's test. Right hip/back: No tenderness palpation over the SI joint, piriformis, or greater trochanter. Normal internal and external rotation. Normal strength resistance with hip flexion. Negative straight leg raise. Significant weakness with hip abduction reproduction of pain. Neurovascular intact  Limited ultrasound: right knee:  No significant effusion  Normal appearing medial and lateral joint line  Normal QT and PT   Summary: normal exam   Ultrasound and interpretation by Clare Gandy, MD    ASSESSMENT & PLAN:   Acute pain of right knee Ultrasound is reassuring.  Likely this is patellofemoral syndrome. -Counseled on home exercise therapy. -Counseled on supportive care. -If no improvement then refer to physical therapy.  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  She has some weakness on hip abduction with reproduction of pain.  MRI did not demonstrate any significant nerve impingement.  Pain in her back could be associated with a  spondylosis. -Counseled home exercise therapy. -Duexis. -If no improvement can consider injection versus physical therapy and imaging.  Chronic low back pain No suggestion of nerve impingement on MRI.  There is canal stenosis but unsure if this is causing any spinal stenosis type of symptoms.  Does have spondylosis on MRI.  This could cause some of her low back pain. -Could benefit from physical therapy. -Could consider epidural injections if pain seems to worsen.

## 2018-06-01 ENCOUNTER — Ambulatory Visit (INDEPENDENT_AMBULATORY_CARE_PROVIDER_SITE_OTHER): Payer: Managed Care, Other (non HMO)

## 2018-06-01 ENCOUNTER — Ambulatory Visit: Payer: Managed Care, Other (non HMO) | Admitting: Family Medicine

## 2018-06-01 ENCOUNTER — Encounter: Payer: Self-pay | Admitting: Family Medicine

## 2018-06-01 VITALS — BP 122/72 | HR 72 | Ht 67.0 in

## 2018-06-01 DIAGNOSIS — M545 Low back pain, unspecified: Secondary | ICD-10-CM

## 2018-06-01 DIAGNOSIS — G8929 Other chronic pain: Secondary | ICD-10-CM

## 2018-06-01 DIAGNOSIS — M25561 Pain in right knee: Secondary | ICD-10-CM

## 2018-06-01 DIAGNOSIS — M25551 Pain in right hip: Secondary | ICD-10-CM | POA: Insufficient documentation

## 2018-06-01 HISTORY — DX: Pain in right hip: M25.551

## 2018-06-01 MED ORDER — IBUPROFEN-FAMOTIDINE 800-26.6 MG PO TABS: 1.0000 | ORAL_TABLET | Freq: Three times a day (TID) | ORAL | 3 refills | Status: DC

## 2018-06-01 NOTE — Patient Instructions (Signed)
Nice to meet you  Please try the exercises  Please follow up with me if your would like to try custom orthotics  Please try to stay active

## 2018-06-01 NOTE — Assessment & Plan Note (Addendum)
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  She has some weakness on hip abduction with reproduction of pain.  MRI did not demonstrate any significant nerve impingement.  Pain in her back could be associated with a spondylosis. -Counseled home exercise therapy. -Duexis. -If no improvement can consider injection versus physical therapy and imaging.

## 2018-06-01 NOTE — Assessment & Plan Note (Signed)
No suggestion of nerve impingement on MRI.  There is canal stenosis but unsure if this is causing any spinal stenosis type of symptoms.  Does have spondylosis on MRI.  This could cause some of her low back pain. -Could benefit from physical therapy. -Could consider epidural injections if pain seems to worsen.

## 2018-06-01 NOTE — Assessment & Plan Note (Signed)
Ultrasound is reassuring.  Likely this is patellofemoral syndrome. -Counseled on home exercise therapy. -Counseled on supportive care. -If no improvement then refer to physical therapy.

## 2018-06-04 ENCOUNTER — Other Ambulatory Visit: Payer: Self-pay

## 2018-06-04 DIAGNOSIS — R937 Abnormal findings on diagnostic imaging of other parts of musculoskeletal system: Secondary | ICD-10-CM

## 2018-06-16 ENCOUNTER — Other Ambulatory Visit (INDEPENDENT_AMBULATORY_CARE_PROVIDER_SITE_OTHER): Payer: Managed Care, Other (non HMO)

## 2018-06-16 ENCOUNTER — Encounter: Payer: Self-pay | Admitting: Family Medicine

## 2018-06-16 ENCOUNTER — Other Ambulatory Visit: Payer: Managed Care, Other (non HMO)

## 2018-06-16 DIAGNOSIS — R748 Abnormal levels of other serum enzymes: Secondary | ICD-10-CM | POA: Diagnosis not present

## 2018-06-16 LAB — CK: Total CK: 217 U/L — ABNORMAL HIGH (ref 7–177)

## 2018-06-16 NOTE — Telephone Encounter (Signed)
See my comments on labs

## 2018-06-16 NOTE — Telephone Encounter (Signed)
Patient in office with complaint of echo not being scheduled. Apologized to patient as we should have called central scheduling to have the echo scheduled. No precert required. Echo now scheduled. Patient aware.

## 2018-06-17 NOTE — Addendum Note (Signed)
Addended byConrad Ellensburg D on: 06/17/2018 01:23 PM   Modules accepted: Orders

## 2018-06-18 ENCOUNTER — Other Ambulatory Visit (HOSPITAL_BASED_OUTPATIENT_CLINIC_OR_DEPARTMENT_OTHER): Payer: Managed Care, Other (non HMO)

## 2018-06-22 ENCOUNTER — Encounter: Payer: Self-pay | Admitting: Family Medicine

## 2018-06-23 ENCOUNTER — Ambulatory Visit (HOSPITAL_BASED_OUTPATIENT_CLINIC_OR_DEPARTMENT_OTHER)
Admission: RE | Admit: 2018-06-23 | Discharge: 2018-06-23 | Disposition: A | Discharge status: Home / Self Care | Payer: 59 | Source: Ambulatory Visit | Attending: Family Medicine | Admitting: Family Medicine

## 2018-06-23 ENCOUNTER — Telehealth: Payer: Self-pay | Admitting: *Deleted

## 2018-06-23 DIAGNOSIS — I519 Heart disease, unspecified: Secondary | ICD-10-CM

## 2018-06-23 DIAGNOSIS — R0600 Dyspnea, unspecified: Secondary | ICD-10-CM | POA: Diagnosis not present

## 2018-06-23 DIAGNOSIS — I340 Nonrheumatic mitral (valve) insufficiency: Secondary | ICD-10-CM | POA: Insufficient documentation

## 2018-06-23 DIAGNOSIS — I10 Essential (primary) hypertension: Secondary | ICD-10-CM | POA: Insufficient documentation

## 2018-06-23 DIAGNOSIS — R0609 Other forms of dyspnea: Secondary | ICD-10-CM | POA: Diagnosis present

## 2018-06-23 DIAGNOSIS — E785 Hyperlipidemia, unspecified: Secondary | ICD-10-CM | POA: Diagnosis not present

## 2018-06-23 NOTE — Progress Notes (Signed)
  Echocardiogram 2D Echocardiogram has been performed.  Adonay Scheier T Gustavus Haskin 06/23/2018, 11:22 AM

## 2018-06-23 NOTE — Telephone Encounter (Signed)
-----   Message from Kathryn Schultz, DO sent at 06/23/2018 12:52 PM EDT ----- Mild diastolic dysfunction---  Refer to cardiology if symptoms persist

## 2018-06-23 NOTE — Telephone Encounter (Signed)
Notified pt of below. She prefers to be referred to cardiology now due to family history of heart disease and personal history of hyperlipidemia and this new finding. Referral placed.

## 2018-06-24 ENCOUNTER — Telehealth: Payer: Self-pay | Admitting: Family Medicine

## 2018-06-24 NOTE — Telephone Encounter (Signed)
Copied from CRM [id number]. Topic: General - Other >> Jun 24, 2018 12:18 PM Tamela Oddi wrote: Reason for CRM: Gershon Cull with [facility] called to inquire about the patient's medication, Duexis.  Pharmacy wants to know the diagnosis and the tried and failed medication.  CB# [id number].

## 2018-06-26 NOTE — Telephone Encounter (Signed)
Provided information to [facility].

## 2018-06-26 NOTE — Telephone Encounter (Signed)
pec will route to Dr Jordan Likes

## 2018-07-01 ENCOUNTER — Encounter: Payer: Self-pay | Admitting: Internal Medicine

## 2018-07-01 ENCOUNTER — Ambulatory Visit: Payer: 59 | Admitting: Internal Medicine

## 2018-07-01 VITALS — BP 118/74 | HR 68 | Ht 67.0 in | Wt 180.0 lb

## 2018-07-01 DIAGNOSIS — I10 Essential (primary) hypertension: Secondary | ICD-10-CM | POA: Diagnosis not present

## 2018-07-01 DIAGNOSIS — R0609 Other forms of dyspnea: Secondary | ICD-10-CM

## 2018-07-01 DIAGNOSIS — R079 Chest pain, unspecified: Secondary | ICD-10-CM | POA: Diagnosis not present

## 2018-07-01 DIAGNOSIS — E785 Hyperlipidemia, unspecified: Secondary | ICD-10-CM

## 2018-07-01 DIAGNOSIS — R0683 Snoring: Secondary | ICD-10-CM

## 2018-07-01 MED ORDER — PREDNISONE 50 MG PO TABS: ORAL_TABLET | ORAL | 0 refills | Status: DC

## 2018-07-01 NOTE — Consult Note (Addendum)
Cardiology Office Note:    Date:  07/01/2018   ID:  Kathryn Boyer, DOB [birth year], MRN [id number]  PCP:  Zola Button, Grayling Congress, DO  Cardiologist:  Parke Poisson, MD  Electrophysiologist:  None   Referring MD: Zola Button, Grayling Congress, *   Dyspnea on exertion  History of Present Illness:    Kathryn Boyer is a 62 y.o. female with a hx of anxiety, hyperlipidemia, hypertension, and thyroid disease who presents today for further evaluation of dyspnea on exertion.  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  There also days where she primarily sits.  She also has a significant amount of anxiety and stress related to the day-to-day decisions of her job.  She tells me that she has gained weight and feels that she certainly has weight to lose, and she feels that she is out of shape.  She has significant concern about her cardiovascular health given her sister who had an MI in her 58s, father who had a heart attack at age 1, and a good friend who had a heart attack as well.  She wants to ensure that she does not have significant coronary artery disease, especially in light of her symptoms.  She also occasionally has palpitations but cannot track when these are occurring.  She has been on pivastatin for 3+ years, but has recently had that discontinued due to myalgias.  She does feel that her myalgias have improved after stopping pivastatin.  She has been told that she has the "sticky cholesterol" and should never stop a statin.  She has taken rosuvastatin in the past, and even at a low dose had myalgias.  Currently her lipid panel is HDL 48, LDL 93, total cholesterol 409, and triglycerides 96.  She denies chest pain, dyspnea at rest, PND, orthopnea, or leg swelling. Denies syncope or presyncope. Denies dizziness or lightheadedness.  Endorses snoring and has not be evaluated for sleep apnea.  Cardiovascular medications  include losartan.   She has a positive smoking history (greater than 100 cigarettes in her lifetime), beyond that she has difficulty quantifying the exact amount.  She has rare alcohol use, and denies recreational drug use or current herbal supplement use.  She does tell me that she used to take a diet supplement prescribed by a physician intended to speed her metabolism.  We have discussed in detail her echocardiogram, and decision making regarding further testing.   Past Medical History:  Diagnosis Date  . Anxiety   . Hyperlipidemia   . Hypertension   . Plantar fasciitis   . Thyroid disease     Past Surgical History:  Procedure Laterality Date  . ABDOMINAL HYSTERECTOMY    . BREAST SURGERY    . NOSE SURGERY      Current Medications: Current Meds  Medication Sig  . ALPRAZolam (XANAX) 0.25 MG tablet Take 1 tablet (0.25 mg total) by mouth at bedtime as needed for anxiety.  . cyclobenzaprine (FLEXERIL) 5 MG tablet Take 1 tablet (5 mg total) by mouth 3 (three) times daily as needed for muscle spasms.  Marland Kitchen estradiol (ESTRACE) 0.1 MG/GM vaginal cream Place 1 Applicatorful vaginally 3 (three) times a week.  . flunisolide (NASALIDE) 25 MCG/ACT (0.025%) SOLN Place 2 sprays into the nose 2 (two) times daily.  . Ibuprofen-Famotidine 800-26.6 MG TABS Take 1 tablet by mouth 3 (three) times daily.  Marland Kitchen ipratropium (ATROVENT) 0.06 %  nasal spray USE 1 SPRAY BY NASAL ROUTE 2 TIMES DAILY.  Marland Kitchen levothyroxine (SYNTHROID, LEVOTHROID) 100 MCG tablet TAKE 1 TABLET DAILY BEFORE BREAKFAST  . losartan (COZAAR) 100 MG tablet TAKE 1 TABLET BY MOUTH EVERY DAY  . montelukast (SINGULAIR) 10 MG tablet Take 1 tablet (10 mg total) by mouth daily.  . risedronate (ACTONEL) 35 MG tablet Take 1 tablet (35 mg total) by mouth every 7 (seven) days. with water on empty stomach, nothing by mouth or lie down for next 30 minutes.  . Vitamin D, Ergocalciferol, (DRISDOL) 50000 units CAPS capsule TAKE 1 CAPSULE (50,000 UNITS TOTAL)  BY MOUTH EVERY 7 (SEVEN) DAYS.     Allergies:   Contrast media [iodinated diagnostic agents]; Isovue [iopamidol]; Belviq xr [lorcaserin hcl er]; Saxenda [liraglutide -weight management]; and Penicillins   Social History   Socioeconomic History  . Marital status: Married    Spouse name: Not on file  . Number of children: Not on file  . Years of education: Not on file  . Highest education level: Not on file  Occupational History  . Not on file  Social Needs  . Financial resource strain: Not on file  . Food insecurity:    Worry: Not on file    Inability: Not on file  . Transportation needs:    Medical: Not on file    Non-medical: Not on file  Tobacco Use  . Smoking status: Never Smoker  . Smokeless tobacco: Never Used  Substance and Sexual Activity  . Alcohol use: No  . Drug use: No  . Sexual activity: Not on file  Lifestyle  . Physical activity:    Days per week: Not on file    Minutes per session: Not on file  . Stress: Not on file  Relationships  . Social connections:    Talks on phone: Not on file    Gets together: Not on file    Attends religious service: Not on file    Active member of club or organization: Not on file    Attends meetings of clubs or organizations: Not on file    Relationship status: Not on file  Other Topics Concern  . Not on file  Social History Narrative   Exercise-sometimes    Family History: The patient's family history includes Aneurysm in her father; Cancer in her mother; Heart disease in her father, mother, and sister; Hypertension in her father and mother; Kidney disease in her father; Stroke in her mother.  ROS:   Please see the history of present illness.    All other systems reviewed and are negative.  EKGs/Labs/Other Studies Reviewed:    The following studies were reviewed today:  EKG:  EKG is ordered today.  The ekg ordered today demonstrates normal sinus rhythm, nonspecific T wave abnormalities in the lateral leads and  inferiorly.  She has borderline criteria for left ventricular hypertrophy in lead aVL.  Recent Labs: 09/22/2017: Hemoglobin 15.5; Platelets 274.0 05/25/2018: ALT 34; BUN 12; Creatinine, Ser 0.86; Potassium 4.4; Sodium 140; TSH 0.88  Recent Lipid Panel    Component Value Date/Time   CHOL 161 05/25/2018 1036   TRIG 96.0 05/25/2018 1036   HDL 48.60 05/25/2018 1036   CHOLHDL 3 05/25/2018 1036   VLDL 19.2 05/25/2018 1036   LDLCALC 93 05/25/2018 1036    Physical Exam:    VS:  BP 118/74   Pulse 68   Ht 5\' 7"  (1.702 m)   Wt 180 lb (81.6 kg)   BMI 28.19  kg/m     Wt Readings from Last 3 Encounters:  07/01/18 180 lb (81.6 kg)  11/18/17 177 lb 12.8 oz (80.6 kg)  09/04/17 180 lb 9.6 oz (81.9 kg)     Constitutional: No acute distress Eyes: pupils equally round and reactive to light, sclera non-icteric, normal conjunctiva and lids ENMT: normal dentition, moist mucous membranes Cardiovascular: regular rhythm, normal rate, no murmurs. S1 and S2 normal. Radial pulses normal bilaterally. No jugular venous distention.  Respiratory: clear to auscultation bilaterally GI : normal bowel sounds, soft and nontender. No distention.   MSK: extremities warm, well perfused. No edema.  NEURO: grossly nonfocal exam, moves all extremities. PSYCH: alert and oriented x 3, normal mood and affect.   ASSESSMENT:    1. Dyspnea on exertion   2. Essential hypertension   3. Hyperlipidemia, unspecified hyperlipidemia type   4. Chest pain, unspecified type    PLAN:    1. Dyspnea on exertion   2. Essential hypertension   3. Hyperlipidemia, unspecified hyperlipidemia type   4. Chest pain, unspecified type      She has grade 1 diastolic dysfunction without elevated filling pressures.  Her echocardiogram also demonstrates an ascending aorta measuring 34 mm which is not dilated when indexed to body surface area for her age.  She has mild mitral valve regurgitation which is unlikely to be the source of her  dyspnea on exertion.  Overall her echocardiogram is reassuring, and does not suggest a cause for her dyspnea.  We have discussed at great length the various options for testing.  I have offered to the patient that we can observe her symptoms and she can begin to get into better shape and improve her diet.  We have also discussed provocative testing with stress testing and defining anatomy with functional testing on CT with FFR.  She has several questions relating to whether she should be on a statin, and is very curious to know if she has blockages in her arteries.  With this discussion I believe a CT coronary angiogram would be our next best test.  We can evaluate for coronary artery calcium burden as well as degree of atherosclerotic plaque that may be contributing to her symptoms.  In addition with cross-sectional imaging we can rule out anomalous pulmonary veins and evaluate for pulmonary artery size as well as right ventricular size.  She is a patient who I ideally would have liked to perform exercise stress, however she does have nonspecific changes on her electrocardiogram which make ETT a less reliable study.  With her symptoms being quite atypical and possibly noncardiac, I hesitate to to stress testing as the first option.  We certainly could consider exercise treadmill nuclear study if CT angiography proves equivocal.  We discussed today exercise and dietary modifications.  This may help her with concerns of deconditioning.  I will see her back to discuss further in 3 months time.  Exercise recommendations: Goal of exercising for at least 30 minutes a day, at least 5 times per week.  Please exercise to a moderate exertion.  This means that while exercising it is difficult to speak in full sentences, however you are not so short of breath that you feel that you must stop, and not so comfortable that you can carry on a full conversation.  Exertion level should be approximately a 5/10, if 10 is the  most exertion you can perform.  Diet recommendations: Recommend a heart healthy diet such as the Mediterranean diet.  This  diet consists of healthy fats, lean meats, olive oil.  It suggests limiting the intake of simple carbohydrates such as white breads, pastries, and pastas.  It also limits the amount of red meat, wine, and dairy products such as cheese that one should consume on a daily basis.  She also endorses snoring and has an Epworth Sleepiness Scale score of 13.  We will discuss this further at her next visit, allowing her time for dietary and exercise modifications for lifestyle benefit.  Medication Adjustments/Labs and Tests Ordered: Current medicines are reviewed at length with the patient today.  Concerns regarding medicines are outlined above.  Orders Placed This Encounter  Procedures  . CT CORONARY MORPH W/CTA COR W/SCORE W/CA W/CM &/OR WO/CM  . CT CORONARY FRACTIONAL FLOW RESERVE DATA PREP  . CT CORONARY FRACTIONAL FLOW RESERVE FLUID ANALYSIS  . Basic metabolic panel  . EKG 12-Lead   Meds ordered this encounter  Medications  . DISCONTD: predniSONE (DELTASONE) 50 MG tablet    Sig: Take Prednisone 50mg  13 hours prior to test Take another Prednisone 50 mg 7 hours prior to test Take another Prednisone 50 mg 1 hour prior to test    Dispense:  3 tablet    Refill:  0  . predniSONE (DELTASONE) 50 MG tablet    Sig: Take  50mg  13 hours prior to test,Take another 50 mg 7 hours prior to test, Take another 50 mg 1 hour prior to test    Dispense:  3 tablet    Refill:  0    Patient Instructions  Medication Instructions:  Your physician recommends that you continue on your current medications as directed. Please refer to the Current Medication list given to you today.  If you need a refill on your cardiac medications before your next appointment, please call your pharmacy.   Lab work: Nutritional therapist today If you have labs (blood work) drawn today and your tests are completely normal, you  will receive your results only by: Marland Kitchen MyChart Message (if you have MyChart) OR . A paper copy in the mail If you have any lab test that is abnormal or we need to change your treatment, we will call you to review the results.  Testing/Procedures: Your physician has requested that you have cardiac CT. Cardiac computed tomography (CT) is a painless test that uses an x-ray machine to take clear, detailed pictures of your heart. For further information please visit [email or web address] Please follow instruction sheet as given.     Follow-Up: At [facility], you and your health needs are our priority.  As part of our continuing mission to provide you with exceptional heart care, we have created designated Provider Care Teams.  These Care Teams include your primary Cardiologist (physician) and Advanced Practice Providers (APPs -  Physician Assistants and Nurse Practitioners) who all work together to provide you with the care you need, when you need it. Your physician recommends that you schedule a follow-up appointment in: 3 months with Dr.Aunesti Pellegrino at the [facility] office  Any Other Special Instructions Will Be Listed Below (If Applicable). Please arrive at the [facility] main entrance of [facility] at TBD AM (30-45 minutes prior to test start time)  [facility] [address] [place], [place] [address] [phone]  Proceed to the [facility] Radiology Department (First Floor).  Please follow these instructions carefully (unless otherwise directed):   On the Night Before the Test: . Be sure to Drink plenty of water. . Do  not consume any caffeinated/decaffeinated beverages or chocolate 12 hours prior to your test. . Do not take any antihistamines 12 hours prior to your test. . If the patient has contrast allergy: ? Patient will need a prescription for Prednisone and very clear instructions (as follows): 1. Prednisone 50 mg - take 13 hours prior to test 2. Take  another Prednisone 50 mg 7 hours prior to test 3. Take another Prednisone 50 mg 1 hour prior to test 4. Take Benadryl 50 mg 1 hour prior to test . Patient must complete all four doses of above prophylactic medications. . Patient will need a ride after test due to Benadryl.  On the Day of the Test: . Drink plenty of water. Do not drink any water within one hour of the test. . Do not eat any food 4 hours prior to the test. . You may take your regular medications prior to the test.         After the Test: . Drink plenty of water. . After receiving IV contrast, you may experience a mild flushed feeling. This is normal. . On occasion, you may experience a mild rash up to 24 hours after the test. This is not dangerous. If this occurs, you can take Benadryl 25 mg and increase your fluid intake. . If you experience trouble breathing, this can be serious. If it is severe call 911 IMMEDIATELY. If it is mild, please call our office. . If you take any of these medications: Glipizide/Metformin, Avandament, Glucavance, please do not take 48 hours after completing test.     Signed, Parke Poisson, MD  07/01/2018 10:35 AM    [facility]

## 2018-07-01 NOTE — Patient Instructions (Addendum)
Medication Instructions:  Your physician recommends that you continue on your current medications as directed. Please refer to the Current Medication list given to you today.  If you need a refill on your cardiac medications before your next appointment, please call your pharmacy.   Lab work: Nutritional therapist today If you have labs (blood work) drawn today and your tests are completely normal, you will receive your results only by: Marland Kitchen MyChart Message (if you have MyChart) OR . A paper copy in the mail If you have any lab test that is abnormal or we need to change your treatment, we will call you to review the results.  Testing/Procedures: Your physician has requested that you have cardiac CT. Cardiac computed tomography (CT) is a painless test that uses an x-ray machine to take clear, detailed pictures of your heart. For further information please visit [email or web address] Please follow instruction sheet as given.     Follow-Up: At [facility], you and your health needs are our priority.  As part of our continuing mission to provide you with exceptional heart care, we have created designated Provider Care Teams.  These Care Teams include your primary Cardiologist (physician) and Advanced Practice Providers (APPs -  Physician Assistants and Nurse Practitioners) who all work together to provide you with the care you need, when you need it. Your physician recommends that you schedule a follow-up appointment in: 3 months with Dr.Acharya at the [facility] office  Any Other Special Instructions Will Be Listed Below (If Applicable). Please arrive at the [facility] main entrance of [facility] at TBD AM (30-45 minutes prior to test start time)  [facility] [address] [place], [place] [address] [phone]  Proceed to the [facility] Radiology Department (First Floor).  Please follow these instructions carefully (unless otherwise directed):   On the Night Before the  Test: . Be sure to Drink plenty of water. . Do not consume any caffeinated/decaffeinated beverages or chocolate 12 hours prior to your test. . Do not take any antihistamines 12 hours prior to your test. . If the patient has contrast allergy: ? Patient will need a prescription for Prednisone and very clear instructions (as follows): 1. Prednisone 50 mg - take 13 hours prior to test 2. Take another Prednisone 50 mg 7 hours prior to test 3. Take another Prednisone 50 mg 1 hour prior to test 4. Take Benadryl 50 mg 1 hour prior to test . Patient must complete all four doses of above prophylactic medications. . Patient will need a ride after test due to Benadryl.  On the Day of the Test: . Drink plenty of water. Do not drink any water within one hour of the test. . Do not eat any food 4 hours prior to the test. . You may take your regular medications prior to the test.         After the Test: . Drink plenty of water. . After receiving IV contrast, you may experience a mild flushed feeling. This is normal. . On occasion, you may experience a mild rash up to 24 hours after the test. This is not dangerous. If this occurs, you can take Benadryl 25 mg and increase your fluid intake. . If you experience trouble breathing, this can be serious. If it is severe call 911 IMMEDIATELY. If it is mild, please call our office. . If you take any of these medications: Glipizide/Metformin, Avandament, Glucavance, please do not take 48 hours after completing test.

## 2018-07-02 LAB — BASIC METABOLIC PANEL
BUN/Creatinine Ratio: 14 (ref 12–28)
BUN: 12 mg/dL (ref 8–27)
CHLORIDE: 105 mmol/L (ref 96–106)
CO2: 23 mmol/L (ref 20–29)
Calcium: 10.2 mg/dL (ref 8.7–10.3)
Creatinine, Ser: 0.88 mg/dL (ref 0.57–1.00)
GFR, EST AFRICAN AMERICAN: 81 mL/min/{1.73_m2} (ref >59)
GFR, EST NON AFRICAN AMERICAN: 70 mL/min/{1.73_m2} (ref >59)
Glucose: 93 mg/dL (ref 65–99)
POTASSIUM: 5.2 mmol/L (ref 3.5–5.2)
SODIUM: 145 mmol/L — AB (ref 134–144)

## 2018-07-06 ENCOUNTER — Ambulatory Visit: Payer: 59 | Admitting: Family Medicine

## 2018-07-06 DIAGNOSIS — Z0289 Encounter for other administrative examinations: Secondary | ICD-10-CM

## 2018-08-11 ENCOUNTER — Telehealth (HOSPITAL_COMMUNITY): Payer: Self-pay | Admitting: Emergency Medicine

## 2018-08-11 NOTE — Telephone Encounter (Signed)
Pt returning call regarding scheduled heart CT on 08/13/18; RN navigator reviewed allergies and reviewed prednisone schedule for prophylaxis, pt vebalized understanding; pt also instructed where to park (valet) and where to check in; all questions answered to the best of my ability; offered name and call back number for further questions  Rockwell AlexandriaSara Hisham Provence RN Navigator Cardiac Imaging [phone]

## 2018-08-11 NOTE — Telephone Encounter (Signed)
Reaching out to patient to offer assistance, answer question regarding upcoming cardiac imaging study; unable to reach patient, left message on voicemail with name and call back number for further questions Rockwell AlexandriaSara Neira Bentsen RN Navigator Cardiac Imaging  [phone]

## 2018-08-12 ENCOUNTER — Telehealth (HOSPITAL_COMMUNITY): Payer: Self-pay | Admitting: Emergency Medicine

## 2018-08-12 NOTE — Telephone Encounter (Signed)
Pt calling with concerns of cold/flu like symptoms with nasal congestion/drainage; states she is okay as long as she is propped up but does not want to waste her or our time if the scan cannot be performed; suggested she call her doctor for further suggestions Name and call back number offered Rockwell AlexandriaSara Misao Fackrell [phone]

## 2018-08-13 ENCOUNTER — Encounter (HOSPITAL_COMMUNITY): Payer: Self-pay

## 2018-08-13 ENCOUNTER — Ambulatory Visit (HOSPITAL_COMMUNITY): Admission: RE | Admit: 2018-08-13 | Payer: 59 | Source: Ambulatory Visit

## 2018-08-13 ENCOUNTER — Ambulatory Visit (HOSPITAL_COMMUNITY)
Admission: RE | Admit: 2018-08-13 | Discharge: 2018-08-13 | Disposition: A | Discharge status: Home / Self Care | Payer: 59 | Source: Ambulatory Visit | Attending: Internal Medicine | Admitting: Internal Medicine

## 2018-08-13 DIAGNOSIS — R0609 Other forms of dyspnea: Secondary | ICD-10-CM | POA: Diagnosis present

## 2018-08-13 DIAGNOSIS — R079 Chest pain, unspecified: Secondary | ICD-10-CM | POA: Diagnosis present

## 2018-08-13 MED ORDER — METOPROLOL TARTRATE 5 MG/5ML IV SOLN
INTRAVENOUS | Status: AC
  Administered 2018-08-13: 5 mg via INTRAVENOUS
  Filled 2018-08-13: qty 30

## 2018-08-13 MED ORDER — NITROGLYCERIN 0.4 MG SL SUBL
SUBLINGUAL_TABLET | SUBLINGUAL | Status: AC
  Administered 2018-08-13: 0.8 mg
  Filled 2018-08-13: qty 2

## 2018-08-13 MED ORDER — IOPAMIDOL (ISOVUE-370) INJECTION 76%
100.0000 mL | Freq: Once | INTRAVENOUS | Status: AC | PRN
  Administered 2018-08-13: 100 mL via INTRAVENOUS

## 2018-08-13 MED ORDER — METOPROLOL TARTRATE 5 MG/5ML IV SOLN
5.0000 mg | INTRAVENOUS | Status: DC | PRN
  Administered 2018-08-13: 2.5 mg via INTRAVENOUS
  Administered 2018-08-13: 5 mg via INTRAVENOUS
  Administered 2018-08-13 (×3): 2.5 mg via INTRAVENOUS
  Filled 2018-08-13: qty 5

## 2018-08-13 MED ORDER — NITROGLYCERIN 0.4 MG SL SUBL
0.8000 mg | SUBLINGUAL_TABLET | Freq: Once | SUBLINGUAL | Status: DC
  Filled 2018-08-13: qty 25

## 2018-08-13 NOTE — Progress Notes (Signed)
Took her 13 hr prep as ordered for her contrast allergy. Has a bad cold. Had a hard time on the CT table as lying flat got her coughing and if propped up too much caused her back to hurt. As per [place] CT tech, the pictures looked good. VS stable in nurses station. Has a nitro headache and small rash on neck which she states she always gets. Denies SOB or any trouble breathing. States will take Benadryl for the rash if she needs it. Discharged walking with her husband.

## 2018-08-16 ENCOUNTER — Encounter: Payer: Self-pay | Admitting: Family Medicine

## 2018-09-07 ENCOUNTER — Other Ambulatory Visit: Payer: Self-pay | Admitting: Family Medicine

## 2018-09-09 NOTE — Telephone Encounter (Signed)
Last vitamin D was 46.75 on 05/25/18

## 2018-10-13 ENCOUNTER — Ambulatory Visit (INDEPENDENT_AMBULATORY_CARE_PROVIDER_SITE_OTHER): Payer: BLUE CROSS/BLUE SHIELD | Admitting: Family Medicine

## 2018-10-13 ENCOUNTER — Encounter: Payer: Self-pay | Admitting: Family Medicine

## 2018-10-13 VITALS — BP 126/84 | HR 66 | Temp 97.9°F | Resp 18

## 2018-10-13 DIAGNOSIS — J4521 Mild intermittent asthma with (acute) exacerbation: Secondary | ICD-10-CM

## 2018-10-13 DIAGNOSIS — J309 Allergic rhinitis, unspecified: Secondary | ICD-10-CM | POA: Insufficient documentation

## 2018-10-13 DIAGNOSIS — K219 Gastro-esophageal reflux disease without esophagitis: Secondary | ICD-10-CM

## 2018-10-13 HISTORY — DX: Allergic rhinitis, unspecified: J30.9

## 2018-10-13 HISTORY — DX: Mild intermittent asthma with (acute) exacerbation: J45.21

## 2018-10-13 HISTORY — DX: Gastro-esophageal reflux disease without esophagitis: K21.9

## 2018-10-13 MED ORDER — MONTELUKAST SODIUM 10 MG PO TABS: 10.0000 mg | ORAL_TABLET | Freq: Every day | ORAL | 5 refills | Status: DC

## 2018-10-13 MED ORDER — FLUTICASONE PROPIONATE HFA 110 MCG/ACT IN AERO: 2.0000 | INHALATION_SPRAY | Freq: Two times a day (BID) | RESPIRATORY_TRACT | 5 refills | Status: DC

## 2018-10-13 MED ORDER — FAMOTIDINE 20 MG PO TABS: ORAL_TABLET | ORAL | 5 refills | Status: DC

## 2018-10-13 MED ORDER — AMOXICILLIN-POT CLAVULANATE 875-125 MG PO TABS: ORAL_TABLET | ORAL | 0 refills | Status: DC

## 2018-10-13 NOTE — Patient Instructions (Addendum)
Acute sinusitis Begin saline nasal rinses. Use this before medicated nasal sprays Begin Mucinex [phone] mg twice a day to thin post nasal drainage Augmentin 875 mg every 12 hours for 10 days for infection  Allergic rhinitis, unspecified seasonality, unspecified trigger Begin Rhinocort 2 sprays in each nostril once a day as needed for a stuffy nose Continue Allegra 180 mg once a day as needed for a runny nose Return to update your skin testing. Remember to stop antihistamines for 3 days before testing  Mild intermittent asthma with acute exacerbation Begin montelukast 10 mg once a day to prevent cough or wheeze Continue ProAir 2 puffs as needed for cough or wheeze For now and for aasthma flares or upper respiratory tract infections, begin Flovent 110-2 puffs with a spacer twice a day for 2 weeks or until cough and wheeze free  Gastroesophageal reflux disease Begin lifestyle modifications as listed below.  Begin famotidine 20 mg twice a day for reflux  Call the clinic if this treatment plan is not working well for you  Follow up in 2 months or sooner if needed   Lifestyle Changes for Controlling GERD  When you have GERD, stomach acid feels as if it's backing up toward your mouth. Whether or not you take medication to control your GERD, your symptoms can often be improved with lifestyle changes.   Raise Your Head  Reflux is more likely to strike when you're lying down flat, because stomach fluid can  flow backward more easily. Raising the head of your bed 4-6 inches can help. To do this:  Slide blocks or books under the legs at the head of your bed. Or, place a wedge under  the mattress. Many foam stores can make a suitable wedge for you. The wedge  should run from your waist to the top of your head.  Don't just prop your head on several pillows. This increases pressure on your  stomach. It can make GERD worse.  Watch Your Eating Habits Certain foods may increase the acid  in your stomach or relax the lower esophageal sphincter, making GERD more likely. It's best to avoid the following:  Coffee, tea, and carbonated drinks (with and without caffeine)  Fatty, fried, or spicy food  Mint, chocolate, onions, and tomatoes  Any other foods that seem to irritate your stomach or cause you pain  Relieve the Pressure  Eat smaller meals, even if you have to eat more often.  Don't lie down right after you eat. Wait a few hours for your stomach to empty.  Avoid tight belts and tight-fitting clothes.  Lose excess weight.  Tobacco and Alcohol  Avoid smoking tobacco and drinking alcohol. They can make GERD symptoms worse.

## 2018-10-13 NOTE — Progress Notes (Signed)
[address] [place] [id number] Dept: [phone]  FOLLOW UP NOTE  Patient ID: Kathryn BachelorDiane Boyer, female    DOB: Aug 09, 1956  Age: 63 y.o. MRN: [id number] Date of Office Visit: 10/13/2018  Assessment  Chief Complaint: Follow-up (Allergy)  HPI Kathryn Boyer is a 63 year old female who presents to the clinic for a sick visit. She reports that about 1.5 weeks ago she began to experience ear fullness, headache, green nasal drainage, thick post nasal drainage, and shortness of breath. She called the telephone doctor via her insurance who provided her with a prednisone taper and an albuterol inhaler. She reports that helped some, however, she continues to experience these symptoms. She reports asthma as intermittent with about 4-5 attacks per year for which she uses albuterol. Allergic rhinitis is reported as poorly controlled with Allegra that she started about 1 week ago. She reports occasional heartburn for which she takes her husband's prescription medication which she cannot recall the name of. Her current medications are listed in the chart.    Drug Allergies:  Allergies  Allergen Reactions  . Contrast Media [Iodinated Diagnostic Agents] Hives  . Isovue [Iopamidol] Hives    After returning home pt called to inform technologist of rash to face and neck, denies difficulty breathing or swallowing, pt took 2 benedryl Will need premeds in future  . Belviq Xr [Lorcaserin Hcl Er] Itching    Caused her face to turn red.  Kathryn Boyer. Saxenda [Liraglutide -Weight Management] Other (See Comments)    Caused indigestion.  Marland Kitchen. Penicillins Rash    Physical Exam: BP 126/84 (BP Location: Left Arm, Patient Position: Sitting, Cuff Size: Normal)   Pulse 66   Temp 97.9 F (36.6 C) (Oral)   Resp 18   SpO2 97%    Physical Exam Vitals signs reviewed.  Constitutional:      Appearance: Normal appearance.  HENT:     Head: Normocephalic and atraumatic.     Right Ear: Tympanic membrane normal.     Left Ear:  Tympanic membrane normal.     Nose:     Comments: Bilateral nares slightly erythematous with thick clear nasal drainage noted. Pharynx erythematous with no exudate noted. Ears normal. Eyes normal. Neck:     Musculoskeletal: Normal range of motion and neck supple.  Cardiovascular:     Rate and Rhythm: Normal rate and regular rhythm.     Heart sounds: Normal heart sounds. No murmur.  Pulmonary:     Effort: Pulmonary effort is normal.     Breath sounds: Normal breath sounds.     Comments: Lungs clear to auscultation Musculoskeletal: Normal range of motion.  Skin:    General: Skin is warm and dry.  Neurological:     Mental Status: She is alert and oriented to person, place, and time.  Psychiatric:        Mood and Affect: Mood normal.        Behavior: Behavior normal.        Thought Content: Thought content normal.        Judgment: Judgment normal.     Diagnostics: FVC 2.61, FEV1 2.13. Predicted FVC 3.59, predicted FEV1 2.76. Spirometry indicates mild restriction. Post bronchodilator therapy FVC 2.57, FEV1 2.20. Post bronchodilator therapy indicated mild restriction with no significant improvement in FEV1.    Assessment and Plan: 1. Allergic rhinitis, unspecified seasonality, unspecified trigger   2. Mild intermittent asthma with acute exacerbation   3. Gastroesophageal reflux disease, esophagitis presence not specified  Meds ordered this encounter  Medications  . amoxicillin-clavulanate (AUGMENTIN) 875-125 MG tablet    Sig: 1 tablet every 12 hours for 10 days for infection    Dispense:  20 tablet    Refill:  0  . montelukast (SINGULAIR) 10 MG tablet    Sig: Take 1 tablet (10 mg total) by mouth at bedtime.    Dispense:  30 tablet    Refill:  5  . fluticasone (FLOVENT HFA) 110 MCG/ACT inhaler    Sig: Inhale 2 puffs into the lungs 2 (two) times daily.    Dispense:  1 Inhaler    Refill:  5  . famotidine (PEPCID) 20 MG tablet    Sig: 1 tbalet twice a day for reflux     Dispense:  30 tablet    Refill:  5    Patient Instructions  Acute sinusitis Begin saline nasal rinses. Use this before medicated nasal sprays Begin Mucinex [phone] mg twice a day to thin post nasal drainage Augmentin 875 mg every 12 hours for 10 days for infection  Allergic rhinitis, unspecified seasonality, unspecified trigger Begin Rhinocort 2 sprays in each nostril once a day as needed for a stuffy nose Continue Allegra 180 mg once a day as needed for a runny nose Return to update your skin testing. Remember to stop antihistamines for 3 days before testing  Mild intermittent asthma with acute exacerbation Begin montelukast 10 mg once a day to prevent cough or wheeze Continue ProAir 2 puffs as needed for cough or wheeze For now and for aasthma flares or upper respiratory tract infections, begin Flovent 110-2 puffs with a spacer twice a day for 2 weeks or until cough and wheeze free  Gastroesophageal reflux disease Begin lifestyle modifications as listed below.  Begin famotidine 20 mg twice a day for reflux  Call the clinic if this treatment plan is not working well for you  Follow up in 2 months or sooner if needed   Lifestyle Changes for Controlling GERD  When you have GERD, stomach acid feels as if it's backing up toward your mouth. Whether or not you take medication to control your GERD, your symptoms can often be improved with lifestyle changes.   Raise Your Head  Reflux is more likely to strike when you're lying down flat, because stomach fluid can  flow backward more easily. Raising the head of your bed 4-6 inches can help. To do this:  Slide blocks or books under the legs at the head of your bed. Or, place a wedge under  the mattress. Many foam stores can make a suitable wedge for you. The wedge  should run from your waist to the top of your head.  Don't just prop your head on several pillows. This increases pressure on your  stomach. It can make GERD  worse.  Watch Your Eating Habits Certain foods may increase the acid in your stomach or relax the lower esophageal sphincter, making GERD more likely. It's best to avoid the following:  Coffee, tea, and carbonated drinks (with and without caffeine)  Fatty, fried, or spicy food  Mint, chocolate, onions, and tomatoes  Any other foods that seem to irritate your stomach or cause you pain  Relieve the Pressure  Eat smaller meals, even if you have to eat more often.  Don't lie down right after you eat. Wait a few hours for your stomach to empty.  Avoid tight belts and tight-fitting clothes.  Lose excess weight.  Tobacco and Alcohol  Avoid smoking tobacco and drinking alcohol. They can make GERD symptoms worse.    Return in about 2 months (around 12/12/2018), or if symptoms worsen or fail to improve.   Thank you for the opportunity to care for this patient.  Please do not hesitate to contact me with questions.  Thermon Leyland, FNP [facility]  I have provided oversight concerning Thermon Leyland' evaluation and treatment of this patient's health issues addressed during today's encounter. I agree with the assessment and therapeutic plan as outlined in the note.   Thank you for the opportunity to care for this patient.  Please do not hesitate to contact me with questions.  Tonette Bihari, M.D.  [facility] [address] [place], [place] [address] [phone]

## 2018-11-05 ENCOUNTER — Telehealth: Payer: Self-pay | Admitting: Family Medicine

## 2018-11-09 ENCOUNTER — Other Ambulatory Visit: Payer: Self-pay | Admitting: Family Medicine

## 2018-11-09 DIAGNOSIS — I10 Essential (primary) hypertension: Secondary | ICD-10-CM

## 2018-11-09 MED ORDER — IRBESARTAN 150 MG PO TABS: 150.0000 mg | ORAL_TABLET | Freq: Every day | ORAL | 1 refills | Status: DC

## 2018-11-09 NOTE — Telephone Encounter (Signed)
Spoke w/ Pt- informed of that irbesartan has been sent. Pt had some questions regarding the losartan recall--answered her questions. Pt has appt w/ PCP on 11/23/2018 can check BP then.

## 2018-11-09 NOTE — Telephone Encounter (Signed)
Pt stated that her pharmacy advised her that the Losartan is being discontinued and she has been without her medication for over a week. Pt stated she would like to know if a new Rx will be sent to her pharmacy. Pt stated she was also told that the pharmacy may be closing for a few weeks so she really needs the Rx to be sent in as soon as possible.

## 2018-11-09 NOTE — Telephone Encounter (Signed)
I have not seen anything from her pharmacy--- I sent avapro in --- bp check in 3 weeks nurse only to make sure its working----- if we are still dealing with corona then we can postpone

## 2018-11-09 NOTE — Addendum Note (Signed)
Addended byConrad  D on: 11/09/2018 02:47 PM   Modules accepted: Orders

## 2018-11-09 NOTE — Telephone Encounter (Signed)
Have not received word from her pharmacy that losartan was not in stock.

## 2018-11-10 MED ORDER — OLMESARTAN MEDOXOMIL 20 MG PO TABS: 20.0000 mg | ORAL_TABLET | Freq: Every day | ORAL | 1 refills | Status: DC

## 2018-11-10 NOTE — Telephone Encounter (Signed)
benicar 20 1 po qd #30  1 refill

## 2018-11-12 ENCOUNTER — Other Ambulatory Visit: Payer: Self-pay | Admitting: Family Medicine

## 2018-11-14 ENCOUNTER — Other Ambulatory Visit: Payer: Self-pay | Admitting: Family Medicine

## 2018-11-16 NOTE — Telephone Encounter (Signed)
Please advise on refill, Was refilled on 09/2018

## 2018-11-16 NOTE — Telephone Encounter (Signed)
Patient notified that there are 5 refills remaining.

## 2018-11-23 ENCOUNTER — Encounter: Payer: Managed Care, Other (non HMO) | Admitting: Family Medicine

## 2018-11-24 ENCOUNTER — Telehealth: Payer: Self-pay

## 2018-11-24 ENCOUNTER — Encounter: Payer: Self-pay | Admitting: Family Medicine

## 2018-11-24 ENCOUNTER — Telehealth: Payer: Self-pay | Admitting: Pediatrics

## 2018-11-24 NOTE — Telephone Encounter (Signed)
My headaches ,fluid in ears are due to sinus infection ,when take Amoxicillin they get worse.  I don't see my primary for allergy   related problems!!  In future think I need to see Dr   Beaulah Dinning or change Dr.    Lynford Humphrey again for no help!   Please advise as to my chart message

## 2018-11-24 NOTE — Telephone Encounter (Signed)
Can you please have this patient stop taking the amoxicillin that was prescribed over 6 weeks ago. Please recommend that she follow up with her PCP for the daily headaches asap. Thank you

## 2018-11-24 NOTE — Telephone Encounter (Signed)
Patient reports headache and stuffy ears. She agrees to begin Mucinex [phone] mg twice a day and begin steroid nasal spray 1-2 sprays once a day in addition to saline nasal rinses (if not painful). She will call if no improvement in the next 2 days.

## 2018-11-24 NOTE — Telephone Encounter (Signed)
Pt stated the headaches are related to sinus issues and it is not a pcp issue. An hung up

## 2018-11-24 NOTE — Telephone Encounter (Signed)
PT states she believes amoxicillian is causing severe headache. Was given this med at last OV in feb. She had to stop taking it. Still has headaches 3 weeks later with no symptom improvement. Would like new med.

## 2018-11-24 NOTE — Telephone Encounter (Signed)
Took it 2-3 days and have awful headaches. Pt took one the other day and still got a headache. Pt is having daily headaches but with amox the headaches are worse. She said she is taking a lot of aspirin. Ears are still stopped up. When she does the saline wash it makes the headache hurt.

## 2018-11-26 ENCOUNTER — Encounter: Payer: Self-pay | Admitting: Family Medicine

## 2018-11-26 ENCOUNTER — Other Ambulatory Visit: Payer: Self-pay | Admitting: Family Medicine

## 2018-11-26 DIAGNOSIS — F411 Generalized anxiety disorder: Secondary | ICD-10-CM

## 2018-11-27 ENCOUNTER — Encounter: Payer: Self-pay | Admitting: Family Medicine

## 2018-11-27 ENCOUNTER — Other Ambulatory Visit: Payer: Self-pay | Admitting: Family Medicine

## 2018-11-27 DIAGNOSIS — F411 Generalized anxiety disorder: Secondary | ICD-10-CM

## 2018-11-27 MED ORDER — ALPRAZOLAM 0.25 MG PO TABS: 0.2500 mg | ORAL_TABLET | Freq: Every evening | ORAL | 0 refills | Status: DC | PRN

## 2018-11-27 NOTE — Telephone Encounter (Signed)
Refill request for alprazolam.   Last OV: 11/09/2018 Last Fill: 05/25/2018 #30 and 0RF UDS: 11/18/2017 Low risk

## 2018-11-29 ENCOUNTER — Other Ambulatory Visit: Payer: Self-pay | Admitting: Family Medicine

## 2018-12-01 ENCOUNTER — Encounter: Payer: Self-pay | Admitting: *Deleted

## 2018-12-02 ENCOUNTER — Other Ambulatory Visit: Payer: Self-pay | Admitting: Family Medicine

## 2018-12-02 DIAGNOSIS — I10 Essential (primary) hypertension: Secondary | ICD-10-CM

## 2018-12-04 ENCOUNTER — Other Ambulatory Visit: Payer: Self-pay | Admitting: Family Medicine

## 2018-12-04 DIAGNOSIS — I10 Essential (primary) hypertension: Secondary | ICD-10-CM

## 2018-12-04 MED ORDER — OLMESARTAN MEDOXOMIL 20 MG PO TABS: 20.0000 mg | ORAL_TABLET | Freq: Every day | ORAL | 1 refills | Status: DC

## 2018-12-04 NOTE — Telephone Encounter (Signed)
Refill given until appt on 01/25/19

## 2018-12-29 ENCOUNTER — Other Ambulatory Visit: Payer: Self-pay | Admitting: Family Medicine

## 2018-12-31 ENCOUNTER — Encounter: Payer: Self-pay | Admitting: Family Medicine

## 2018-12-31 ENCOUNTER — Ambulatory Visit (INDEPENDENT_AMBULATORY_CARE_PROVIDER_SITE_OTHER): Payer: BLUE CROSS/BLUE SHIELD | Admitting: Family Medicine

## 2018-12-31 DIAGNOSIS — I10 Essential (primary) hypertension: Secondary | ICD-10-CM | POA: Diagnosis not present

## 2018-12-31 DIAGNOSIS — E559 Vitamin D deficiency, unspecified: Secondary | ICD-10-CM | POA: Diagnosis not present

## 2018-12-31 DIAGNOSIS — E785 Hyperlipidemia, unspecified: Secondary | ICD-10-CM

## 2018-12-31 DIAGNOSIS — E039 Hypothyroidism, unspecified: Secondary | ICD-10-CM

## 2018-12-31 DIAGNOSIS — Z0000 Encounter for general adult medical examination without abnormal findings: Secondary | ICD-10-CM

## 2018-12-31 DIAGNOSIS — F411 Generalized anxiety disorder: Secondary | ICD-10-CM

## 2018-12-31 MED ORDER — PRAVASTATIN SODIUM 20 MG PO TABS: 20.0000 mg | ORAL_TABLET | Freq: Every day | ORAL | 3 refills | Status: DC

## 2018-12-31 MED ORDER — OLMESARTAN MEDOXOMIL 20 MG PO TABS: 20.0000 mg | ORAL_TABLET | Freq: Every day | ORAL | 1 refills | Status: DC

## 2018-12-31 MED ORDER — ALPRAZOLAM 0.25 MG PO TABS: 0.2500 mg | ORAL_TABLET | Freq: Every evening | ORAL | 0 refills | Status: DC | PRN

## 2018-12-31 NOTE — Progress Notes (Signed)
Virtual Visit via Video Note  I connected with Kathryn Boyer on 12/31/18 at  1:00 PM EDT by a video enabled telemedicine application and verified that I am speaking with the correct person using two identifiers.  Location: Patient: home Provider: home   I discussed the limitations of evaluation and management by telemedicine and the availability of in person appointments. The patient expressed understanding and agreed to proceed.  History of Present Illness: Pt is home with no complaints except she needs refills and labs    Observations/Objective:unable to obtain vitals  Pt is in NAD Dressed neatly    Assessment and Plan: 1. Hyperlipidemia, unspecified hyperlipidemia type Pt had muscle aches with livalo--- try pravachol and if she still has myalgias --- consider zetia - pravastatin (PRAVACHOL) 20 MG tablet; Take 1 tablet (20 mg total) by mouth daily.  Dispense: 90 tablet; Refill: 3 - Lipid panel; Future - Comprehensive metabolic panel; Future  2. Essential hypertension Well controlled, no changes to meds. Encouraged heart healthy diet such as the DASH diet and exercise as tolerated.  - olmesartan (BENICAR) 20 MG tablet; Take 1 tablet (20 mg total) by mouth daily.  Dispense: 30 tablet; Refill: 1  3. Preventative health care To schedule  4. Vitamin D deficiency Check labs  con't meds  - Vitamin D (25 hydroxy); Future  5. Hypothyroidism, unspecified type Check labs  stable - Thyroid Panel With TSH; Future  6. Generalized anxiety disorder Stable Refill med  - ALPRAZolam (XANAX) 0.25 MG tablet; Take 1 tablet (0.25 mg total) by mouth at bedtime as needed for anxiety.  Dispense: 30 tablet; Refill: 0   Follow Up Instructions:    I discussed the assessment and treatment plan with the patient. The patient was provided an opportunity to ask questions and all were answered. The patient agreed with the plan and demonstrated an understanding of the instructions.   The patient  was advised to call back or seek an in-person evaluation if the symptoms worsen or if the condition fails to improve as anticipated.  I provided 25 minutes of non-face-to-face time during this encounter.   Donato Schultz, DO

## 2019-01-05 ENCOUNTER — Other Ambulatory Visit: Payer: Self-pay | Admitting: *Deleted

## 2019-01-05 DIAGNOSIS — M810 Age-related osteoporosis without current pathological fracture: Secondary | ICD-10-CM

## 2019-01-05 MED ORDER — RISEDRONATE SODIUM 35 MG PO TABS: 35.0000 mg | ORAL_TABLET | ORAL | 1 refills | Status: DC

## 2019-01-13 ENCOUNTER — Other Ambulatory Visit: Payer: Self-pay | Admitting: Family Medicine

## 2019-01-13 DIAGNOSIS — I10 Essential (primary) hypertension: Secondary | ICD-10-CM

## 2019-01-25 ENCOUNTER — Encounter: Payer: BLUE CROSS/BLUE SHIELD | Admitting: Family Medicine

## 2019-02-04 ENCOUNTER — Other Ambulatory Visit: Payer: Self-pay

## 2019-02-04 ENCOUNTER — Other Ambulatory Visit (INDEPENDENT_AMBULATORY_CARE_PROVIDER_SITE_OTHER): Payer: BLUE CROSS/BLUE SHIELD

## 2019-02-04 DIAGNOSIS — E559 Vitamin D deficiency, unspecified: Secondary | ICD-10-CM

## 2019-02-04 DIAGNOSIS — E039 Hypothyroidism, unspecified: Secondary | ICD-10-CM | POA: Diagnosis not present

## 2019-02-04 DIAGNOSIS — E785 Hyperlipidemia, unspecified: Secondary | ICD-10-CM | POA: Diagnosis not present

## 2019-02-04 LAB — LIPID PANEL
Cholesterol: 168 mg/dL (ref 0–200)
HDL: 43.7 mg/dL (ref >39.00)
LDL Cholesterol: 94 mg/dL (ref 0–99)
NonHDL: 124.78
Total CHOL/HDL Ratio: 4
Triglycerides: 156 mg/dL — ABNORMAL HIGH (ref 0.0–149.0)
VLDL: 31.2 mg/dL (ref 0.0–40.0)

## 2019-02-04 LAB — COMPREHENSIVE METABOLIC PANEL
ALT: 26 U/L (ref 0–35)
AST: 24 U/L (ref 0–37)
Albumin: 4.5 g/dL (ref 3.5–5.2)
Alkaline Phosphatase: 88 U/L (ref 39–117)
BUN: 12 mg/dL (ref 6–23)
CO2: 26 mEq/L (ref 19–32)
Calcium: 9.7 mg/dL (ref 8.4–10.5)
Chloride: 104 mEq/L (ref 96–112)
Creatinine, Ser: 0.92 mg/dL (ref 0.40–1.20)
GFR: 61.49 mL/min (ref >60.00)
Glucose, Bld: 94 mg/dL (ref 70–99)
Potassium: 4.5 mEq/L (ref 3.5–5.1)
Sodium: 140 mEq/L (ref 135–145)
Total Bilirubin: 0.6 mg/dL (ref 0.2–1.2)
Total Protein: 6.9 g/dL (ref 6.0–8.3)

## 2019-02-04 LAB — VITAMIN D 25 HYDROXY (VIT D DEFICIENCY, FRACTURES): VITD: 56.66 ng/mL (ref 30.00–100.00)

## 2019-02-05 LAB — THYROID PANEL WITH TSH
Free Thyroxine Index: 3.2 (ref 1.4–3.8)
T3 Uptake: 31 % (ref 22–35)
T4, Total: 10.3 ug/dL (ref 5.1–11.9)
TSH: 1.69 mIU/L (ref 0.40–4.50)

## 2019-02-10 NOTE — Progress Notes (Signed)
Labs released to Dwight. Hard copy sent via mail.

## 2019-02-14 ENCOUNTER — Other Ambulatory Visit: Payer: Self-pay | Admitting: Family Medicine

## 2019-02-14 DIAGNOSIS — I10 Essential (primary) hypertension: Secondary | ICD-10-CM

## 2019-02-15 ENCOUNTER — Other Ambulatory Visit: Payer: Self-pay | Admitting: Family Medicine

## 2019-02-15 ENCOUNTER — Encounter: Payer: Self-pay | Admitting: Family Medicine

## 2019-02-15 NOTE — Telephone Encounter (Signed)
Courtesy refill  

## 2019-02-25 ENCOUNTER — Other Ambulatory Visit: Payer: Self-pay | Admitting: Family Medicine

## 2019-02-25 DIAGNOSIS — F411 Generalized anxiety disorder: Secondary | ICD-10-CM

## 2019-02-25 MED ORDER — ALPRAZOLAM 0.25 MG PO TABS: 0.2500 mg | ORAL_TABLET | Freq: Every evening | ORAL | 0 refills | Status: DC | PRN

## 2019-02-25 NOTE — Telephone Encounter (Signed)
Alprazolam refill.   Last OV: 12/31/2018 Last Fill: 12/31/2018 #30 and 0RF UDS; 11/18/2017 Low risk

## 2019-02-25 NOTE — Telephone Encounter (Signed)
Medication: ALPRAZolam (XANAX) 0.25 MG tablet  Has the patient contacted their pharmacy? no  Preferred Pharmacy (with phone number or street name):  [id number] - [place], [place] [phone] (Phone) [phone] (Fax)   Agent: Please be advised that RX refills may take up to 3 business days. We ask that you follow-up with your pharmacy.

## 2019-02-27 ENCOUNTER — Encounter: Payer: Self-pay | Admitting: Family Medicine

## 2019-03-01 ENCOUNTER — Other Ambulatory Visit: Payer: Self-pay | Admitting: Family Medicine

## 2019-03-01 ENCOUNTER — Encounter: Payer: Self-pay | Admitting: Family Medicine

## 2019-03-01 DIAGNOSIS — F411 Generalized anxiety disorder: Secondary | ICD-10-CM

## 2019-03-01 NOTE — Telephone Encounter (Signed)
Requesting: Xanax Contract: 11/18/2017 UDS: 11/18/2017, low risk Last OV: 12/31/2018 Next OV: N/A Last Refill: 07/02/202, #30--0 RF Database:   Please advise

## 2019-03-28 ENCOUNTER — Other Ambulatory Visit: Payer: Self-pay | Admitting: Family Medicine

## 2019-04-06 DIAGNOSIS — H524 Presbyopia: Secondary | ICD-10-CM | POA: Diagnosis not present

## 2019-04-06 DIAGNOSIS — H5203 Hypermetropia, bilateral: Secondary | ICD-10-CM | POA: Diagnosis not present

## 2019-04-06 DIAGNOSIS — H52203 Unspecified astigmatism, bilateral: Secondary | ICD-10-CM | POA: Diagnosis not present

## 2019-04-08 ENCOUNTER — Other Ambulatory Visit: Payer: Self-pay | Admitting: Family Medicine

## 2019-04-08 ENCOUNTER — Encounter: Payer: Self-pay | Admitting: Family Medicine

## 2019-04-08 DIAGNOSIS — F411 Generalized anxiety disorder: Secondary | ICD-10-CM

## 2019-04-08 MED ORDER — ALPRAZOLAM 0.25 MG PO TABS: 0.2500 mg | ORAL_TABLET | Freq: Every evening | ORAL | 1 refills | Status: DC | PRN

## 2019-04-08 NOTE — Telephone Encounter (Signed)
Controlled substances can not be on auto refill I put 1 refill on it

## 2019-04-09 ENCOUNTER — Other Ambulatory Visit: Payer: Self-pay | Admitting: *Deleted

## 2019-04-17 ENCOUNTER — Other Ambulatory Visit: Payer: Self-pay | Admitting: Family Medicine

## 2019-04-27 ENCOUNTER — Encounter: Payer: Self-pay | Admitting: Family Medicine

## 2019-04-27 DIAGNOSIS — N6313 Unspecified lump in the right breast, lower outer quadrant: Secondary | ICD-10-CM | POA: Diagnosis not present

## 2019-04-27 DIAGNOSIS — N6002 Solitary cyst of left breast: Secondary | ICD-10-CM | POA: Diagnosis not present

## 2019-04-27 DIAGNOSIS — N6311 Unspecified lump in the right breast, upper outer quadrant: Secondary | ICD-10-CM | POA: Diagnosis not present

## 2019-04-27 DIAGNOSIS — N6001 Solitary cyst of right breast: Secondary | ICD-10-CM | POA: Diagnosis not present

## 2019-04-29 ENCOUNTER — Ambulatory Visit (INDEPENDENT_AMBULATORY_CARE_PROVIDER_SITE_OTHER): Payer: BC Managed Care – PPO | Admitting: Family Medicine

## 2019-04-29 ENCOUNTER — Other Ambulatory Visit: Payer: Self-pay

## 2019-04-29 ENCOUNTER — Encounter: Payer: Self-pay | Admitting: Family Medicine

## 2019-04-29 VITALS — BP 140/100 | HR 72 | Temp 97.6°F | Resp 18 | Ht 67.0 in | Wt 188.6 lb

## 2019-04-29 DIAGNOSIS — Z23 Encounter for immunization: Secondary | ICD-10-CM

## 2019-04-29 DIAGNOSIS — Z780 Asymptomatic menopausal state: Secondary | ICD-10-CM

## 2019-04-29 DIAGNOSIS — R350 Frequency of micturition: Secondary | ICD-10-CM

## 2019-04-29 DIAGNOSIS — I10 Essential (primary) hypertension: Secondary | ICD-10-CM | POA: Diagnosis not present

## 2019-04-29 DIAGNOSIS — R079 Chest pain, unspecified: Secondary | ICD-10-CM

## 2019-04-29 DIAGNOSIS — N898 Other specified noninflammatory disorders of vagina: Secondary | ICD-10-CM

## 2019-04-29 HISTORY — DX: Chest pain, unspecified: R07.9

## 2019-04-29 HISTORY — DX: Frequency of micturition: R35.0

## 2019-04-29 HISTORY — DX: Asymptomatic menopausal state: Z78.0

## 2019-04-29 LAB — POCT URINALYSIS DIPSTICK OB
Bilirubin, UA: NEGATIVE
Blood, UA: NEGATIVE
Glucose, UA: NEGATIVE
Ketones, UA: NEGATIVE
Leukocytes, UA: NEGATIVE
Nitrite, UA: NEGATIVE
POC,PROTEIN,UA: NEGATIVE
Spec Grav, UA: <=1.005 — AB (ref 1.010–1.025)
Urobilinogen, UA: 0.2 E.U./dL
pH, UA: 7 (ref 5.0–8.0)

## 2019-04-29 MED ORDER — ESTRADIOL 0.025 MG/24HR TD PTTW: 1.0000 | MEDICATED_PATCH | TRANSDERMAL | 12 refills | Status: DC

## 2019-04-29 MED ORDER — OLMESARTAN MEDOXOMIL 40 MG PO TABS: 40.0000 mg | ORAL_TABLET | Freq: Every day | ORAL | 2 refills | Status: DC

## 2019-04-29 NOTE — Assessment & Plan Note (Signed)
ekg-- no acute changes If pain returns go to er Maybe from elevated bp or stress

## 2019-04-29 NOTE — Assessment & Plan Note (Signed)
Poorly controlled will alter medications, encouraged DASH diet, minimize caffeine and obtain adequate sleep. Report concerning symptoms and follow up as directed and as needed Inc benicar to 40 mg daily  F/u 2-3 weeks

## 2019-04-29 NOTE — Assessment & Plan Note (Signed)
Culture pending

## 2019-04-29 NOTE — Patient Instructions (Signed)

## 2019-04-29 NOTE — Assessment & Plan Note (Signed)
Refill vivelle patch

## 2019-04-29 NOTE — Progress Notes (Signed)
+           Patient ID: Kathryn Boyer, female    DOB: [birth year]  Age: 63 y.o. MRN: [id number]    Subjective:  Subjective  HPI Kathryn Boyer presents for urinary frequency and dysuria --- she is still c/o frequency -- no burning today  Pt also c/o chest pain and a pinching feeling in both arms x several weeks   She is under a lot of stress right now  No sob, no palpitations  The chest pain comes and goes   Review of Systems  Constitutional: Negative for appetite change, diaphoresis, fatigue and unexpected weight change.  Eyes: Negative for pain, redness and visual disturbance.  Respiratory: Negative for cough, chest tightness, shortness of breath and wheezing.   Cardiovascular: Positive for chest pain. Negative for palpitations and leg swelling.  Endocrine: Negative for cold intolerance, heat intolerance, polydipsia, polyphagia and polyuria.  Genitourinary: Positive for dysuria and frequency. Negative for difficulty urinating.  Neurological: Negative for dizziness, light-headedness, numbness and headaches.     History Past Medical History:  Diagnosis Date  . Anxiety   . Hyperlipidemia   . Hypertension   . Plantar fasciitis   . Thyroid disease     She has a past surgical history that includes Abdominal hysterectomy; Nose surgery; and Breast surgery.   Her family history includes Aneurysm in her father; Cancer in her mother; Heart disease in her father, mother, and sister; Hypertension in her father and mother; Kidney disease in her father; Stroke in her mother.She reports that she has never smoked. She has never used smokeless tobacco. She reports that she does not drink alcohol or use drugs.  Current Outpatient Medications on File Prior to Visit  Medication Sig Dispense Refill  . ALPRAZolam (XANAX) 0.25 MG tablet Take 1 tablet (0.25 mg total) by mouth at bedtime as needed for anxiety. 30 tablet 1  . cyclobenzaprine (FLEXERIL) 5 MG tablet  Take 1 tablet (5 mg total) by mouth 3 (three) times daily as needed for muscle spasms. 30 tablet 1  . estradiol (ESTRACE) 0.1 MG/GM vaginal cream Place 1 Applicatorful vaginally 3 (three) times a week. 42.5 g 3  . famotidine (PEPCID) 20 MG tablet 1 TBALET TWICE A DAY FOR REFLUX 30 tablet 0  . flunisolide (NASALIDE) 25 MCG/ACT (0.025%) SOLN Place 2 sprays into the nose 2 (two) times daily. 1 Bottle 5  . fluticasone (FLOVENT HFA) 110 MCG/ACT inhaler Inhale 2 puffs into the lungs 2 (two) times daily. 1 Inhaler 5  . levothyroxine (SYNTHROID) 100 MCG tablet TAKE 1 TABLET BY MOUTH EVERY DAY BEFORE BREAKFAST 90 tablet 0  . montelukast (SINGULAIR) 10 MG tablet Take 1 tablet (10 mg total) by mouth at bedtime. 30 tablet 5  . pravastatin (PRAVACHOL) 20 MG tablet Take 1 tablet (20 mg total) by mouth daily. 90 tablet 3  . risedronate (ACTONEL) 35 MG tablet Take 1 tablet (35 mg total) by mouth every 7 (seven) days. with water on empty stomach, nothing by mouth or lie down for next 30 minutes. 12 tablet 1  . VENTOLIN HFA 108 (90 Base) MCG/ACT inhaler 1 PUFF EVERY 6 HOURS UNTIL DIRECTED TO STOP    . Vitamin D, Ergocalciferol, (DRISDOL) 1.25 MG (50000 UT) CAPS capsule TAKE 1 CAPSULE (50,000 UNITS TOTAL) BY MOUTH EVERY 7 (SEVEN) DAYS. 12 capsule 1   No current facility-administered medications on file prior to visit.  Objective:  Objective  Physical Exam Vitals signs and nursing note reviewed.  Constitutional:      Appearance: She is well-developed.  HENT:     Head: Normocephalic and atraumatic.  Eyes:     Conjunctiva/sclera: Conjunctivae normal.  Neck:     Musculoskeletal: Normal range of motion and neck supple.     Thyroid: No thyromegaly.     Vascular: No carotid bruit or JVD.  Cardiovascular:     Rate and Rhythm: Normal rate and regular rhythm.     Heart sounds: Normal heart sounds. No murmur.  Pulmonary:     Effort: Pulmonary effort is normal. No respiratory distress.     Breath sounds:  Normal breath sounds. No wheezing or rales.  Chest:     Chest wall: No tenderness.  Abdominal:     General: There is no distension.     Tenderness: There is no abdominal tenderness. There is no right CVA tenderness, left CVA tenderness, guarding or rebound.  Musculoskeletal: Normal range of motion.        General: No tenderness.     Right lower leg: No edema.     Left lower leg: No edema.  Neurological:     Mental Status: She is alert and oriented to person, place, and time.    BP (!) 140/100 (BP Location: Right Arm, Patient Position: Sitting, Cuff Size: Normal)   Pulse 72   Temp 97.6 F (36.4 C) (Temporal)   Resp 18   Ht 5\' 7"  (1.702 m)   Wt 188 lb 9.6 oz (85.5 kg)   SpO2 97%   BMI 29.54 kg/m  Wt Readings from Last 3 Encounters:  04/29/19 188 lb 9.6 oz (85.5 kg)  07/01/18 180 lb (81.6 kg)  11/18/17 177 lb 12.8 oz (80.6 kg)     Lab Results  Component Value Date   WBC 7.3 09/22/2017   HGB 15.5 (H) 09/22/2017   HCT 46.3 (H) 09/22/2017   PLT 274.0 09/22/2017   GLUCOSE 94 02/04/2019   CHOL 168 02/04/2019   TRIG 156.0 (H) 02/04/2019   HDL 43.70 02/04/2019   LDLCALC 94 02/04/2019   ALT 26 02/04/2019   AST 24 02/04/2019   NA 140 02/04/2019   K 4.5 02/04/2019   CL 104 02/04/2019   CREATININE 0.92 02/04/2019   BUN 12 02/04/2019   CO2 26 02/04/2019   TSH 1.69 02/04/2019  ekg-- no acute changes from previous ekg ua-- essentially normal -- culture pending  Ct Coronary Morph W/cta Cor W/score W/ca W/cm &/or Wo/cm  Addendum Date: 08/13/2018   ADDENDUM REPORT: 08/13/2018 19:11 HISTORY: Dyspnea on exertion, chest pain EXAM: Cardiac/Coronary  CT TECHNIQUE: The patient was scanned on a [employer]. PROTOCOL: A 120 kV prospective scan was triggered in the descending thoracic aorta at 111 HU's. Axial non-contrast 3 mm slices were carried out through the heart. The data set was analyzed on a dedicated work station and scored using the [place]. Gantry rotation speed  was 250 msecs and collimation was .6 mm. No beta blockade and 0.8 mg of sl NTG was given. The 3D data set was reconstructed in 5% intervals of the 67-82 % of the R-R cycle. Diastolic phases were analyzed on a dedicated work station using MPR, MIP and VRT modes. The patient received 80 cc of contrast. FINDINGS: Coronary calcium score: The patient's coronary artery calcium score is 0, which places the patient in the 0 percentile. Coronary arteries: Normal coronary origins.  Right dominance. Right Coronary  Artery: No detectable plaque or stenosis. Patent PDA and posterolateral branches. Left Main Coronary Artery: No detectable plaque or stenosis. Left Anterior Descending Coronary Artery: No detectable plaque or stenosis. Patent diagonal branches. Left Circumflex Artery: No detectable plaque or stenosis. Patent OM branch. Aorta:  Normal size.  No calcifications.  No dissection. Aortic Valve: No calcifications. LV: Grossly normal LV function. Other findings: Normal pulmonary vein drainage into the left atrium. Normal left atrial appendage without a thrombus. Normal size of the pulmonary artery. IMPRESSION: 1. Coronary calcium score of 0. This was 0 percentile for age and sex matched control. 2. Normal coronary origin with right dominance. 3. No evidence of CAD, CADRADS = 0. Electronically Signed   By: Weston BrassGayatri  Acharya   On: 08/13/2018 19:11   Result Date: 08/13/2018 EXAM: OVER-READ INTERPRETATION  CT CHEST The following report is an over-read performed by radiologist Dr. Charlett [facility]oseKevin Dover [facility] on 08/13/2018. This over-read does not include interpretation of cardiac or coronary anatomy or pathology. The coronary CTA interpretation by the cardiologist is attached. COMPARISON:  None. FINDINGS: Vascular: Heart is normal size.  Visualized aorta normal caliber. Mediastinum/Nodes: No adenopathy in the lower mediastinum or hila. Lungs/Pleura: No confluent airspace opacities or effusions. Upper Abdomen:  Imaging into the upper abdomen shows no acute findings. Musculoskeletal: Chest wall soft tissues are unremarkable. Bilateral breast implants noted. No acute bony abnormality. IMPRESSION: No acute or significant extracardiac abnormality. Electronically Signed: By: Charlett NoseKevin  Dover M.D. On: 08/13/2018 13:25     Assessment & Plan:  Plan  I have discontinued Chaunice Bott's amoxicillin-clavulanate and olmesartan. I am also having her start on olmesartan and estradiol. Additionally, I am having her maintain her flunisolide, estradiol, cyclobenzaprine, Vitamin D (Ergocalciferol), Ventolin HFA, montelukast, fluticasone, pravastatin, risedronate, famotidine, levothyroxine, and ALPRAZolam.  Meds ordered this encounter  Medications  . olmesartan (BENICAR) 40 MG tablet    Sig: Take 1 tablet (40 mg total) by mouth daily.    Dispense:  30 tablet    Refill:  2  . estradiol (VIVELLE-DOT) 0.025 MG/24HR    Sig: Place 1 patch onto the skin 2 (two) times a week.    Dispense:  8 patch    Refill:  12    Problem List Items Addressed This Visit      Unprioritized   Chest pain    ekg-- no acute changes If pain returns go to er Maybe from elevated bp or stress       Relevant Orders   EKG 12-Lead (Completed)   Essential hypertension    Poorly controlled will alter medications, encouraged DASH diet, minimize caffeine and obtain adequate sleep. Report concerning symptoms and follow up as directed and as needed Inc benicar to 40 mg daily  F/u 2-3 weeks       Relevant Medications   olmesartan (BENICAR) 40 MG tablet   Post-menopausal   Relevant Medications   estradiol (VIVELLE-DOT) 0.025 MG/24HR   Urinary frequency    Culture pending       Relevant Medications   estradiol (VIVELLE-DOT) 0.025 MG/24HR   Urine frequency - Primary   Relevant Orders   POC Urinalysis Dipstick OB (Completed)   Urine Culture   Vaginal dryness    Refill vivelle patch        Other Visit Diagnoses    Need for influenza  vaccination       Relevant Orders   Flu Vaccine QUAD 36+ mos IM (Completed)      Follow-up: Return in about  2 weeks (around 05/13/2019), or if symptoms worsen or fail to improve, for hypertension.  Donato SchultzYvonne R Lowne Chase, DO

## 2019-05-01 LAB — URINE CULTURE
MICRO NUMBER:: 844506
SPECIMEN QUALITY:: ADEQUATE

## 2019-05-10 ENCOUNTER — Encounter: Payer: Self-pay | Admitting: Family Medicine

## 2019-05-10 DIAGNOSIS — J45909 Unspecified asthma, uncomplicated: Secondary | ICD-10-CM | POA: Diagnosis not present

## 2019-05-10 DIAGNOSIS — Z8262 Family history of osteoporosis: Secondary | ICD-10-CM | POA: Diagnosis not present

## 2019-05-10 DIAGNOSIS — Z90710 Acquired absence of both cervix and uterus: Secondary | ICD-10-CM | POA: Diagnosis not present

## 2019-05-10 DIAGNOSIS — M810 Age-related osteoporosis without current pathological fracture: Secondary | ICD-10-CM | POA: Diagnosis not present

## 2019-05-10 DIAGNOSIS — N6001 Solitary cyst of right breast: Secondary | ICD-10-CM | POA: Diagnosis not present

## 2019-05-10 LAB — HM DEXA SCAN

## 2019-05-11 ENCOUNTER — Telehealth: Payer: Self-pay | Admitting: Family Medicine

## 2019-05-11 NOTE — Telephone Encounter (Signed)
Pt called in to request to have her shingles shot at her apt.    Please assist.

## 2019-05-13 ENCOUNTER — Ambulatory Visit: Payer: BC Managed Care – PPO | Admitting: Family Medicine

## 2019-05-17 ENCOUNTER — Other Ambulatory Visit: Payer: Self-pay

## 2019-05-18 ENCOUNTER — Other Ambulatory Visit: Payer: Self-pay

## 2019-05-18 ENCOUNTER — Encounter: Payer: Self-pay | Admitting: Family Medicine

## 2019-05-18 ENCOUNTER — Ambulatory Visit (INDEPENDENT_AMBULATORY_CARE_PROVIDER_SITE_OTHER): Payer: BC Managed Care – PPO | Admitting: Family Medicine

## 2019-05-18 VITALS — BP 118/80 | HR 75 | Temp 97.1°F | Resp 18 | Ht 67.0 in | Wt 187.8 lb

## 2019-05-18 DIAGNOSIS — I10 Essential (primary) hypertension: Secondary | ICD-10-CM

## 2019-05-18 DIAGNOSIS — I700 Atherosclerosis of aorta: Secondary | ICD-10-CM | POA: Diagnosis not present

## 2019-05-18 DIAGNOSIS — F411 Generalized anxiety disorder: Secondary | ICD-10-CM

## 2019-05-18 DIAGNOSIS — Z23 Encounter for immunization: Secondary | ICD-10-CM

## 2019-05-18 MED ORDER — ALPRAZOLAM 0.25 MG PO TABS: 0.2500 mg | ORAL_TABLET | Freq: Every evening | ORAL | 1 refills | Status: DC | PRN

## 2019-05-18 NOTE — Patient Instructions (Signed)

## 2019-05-18 NOTE — Assessment & Plan Note (Signed)
D/w pt diet and exercise Recheck 3 months

## 2019-05-18 NOTE — Progress Notes (Signed)
Patient ID: Kathryn Boyer, female    DOB: Dec 26, 1955  Age: 63 y.o. MRN: [id number]    Subjective:  Subjective  HPI Kathryn Boyer presents for bp.  No complaints   Review of Systems  Constitutional: Negative for appetite change, diaphoresis, fatigue and unexpected weight change.  Eyes: Negative for pain, redness and visual disturbance.  Respiratory: Negative for cough, chest tightness, shortness of breath and wheezing.   Cardiovascular: Negative for chest pain, palpitations and leg swelling.  Endocrine: Negative for cold intolerance, heat intolerance, polydipsia, polyphagia and polyuria.  Genitourinary: Negative for difficulty urinating, dysuria and frequency.  Neurological: Negative for dizziness, light-headedness, numbness and headaches.    History Past Medical History:  Diagnosis Date   Anxiety    Hyperlipidemia    Hypertension    Plantar fasciitis    Thyroid disease     She has a past surgical history that includes Abdominal hysterectomy; Nose surgery; and Breast surgery.   Her family history includes Aneurysm in her father; Cancer in her mother; Heart disease in her father, mother, and sister; Hypertension in her father and mother; Kidney disease in her father; Stroke in her mother.She reports that she has never smoked. She has never used smokeless tobacco. She reports that she does not drink alcohol or use drugs.  Current Outpatient Medications on File Prior to Visit  Medication Sig Dispense Refill   cyclobenzaprine (FLEXERIL) 5 MG tablet Take 1 tablet (5 mg total) by mouth 3 (three) times daily as needed for muscle spasms. 30 tablet 1   estradiol (ESTRACE) 0.1 MG/GM vaginal cream Place 1 Applicatorful vaginally 3 (three) times a week. 42.5 g 3   estradiol (VIVELLE-DOT) 0.025 MG/24HR Place 1 patch onto the skin 2 (two) times a week. 8 patch 12   famotidine (PEPCID) 20 MG tablet 1 TBALET TWICE A DAY FOR REFLUX 30 tablet 0   flunisolide (NASALIDE) 25 MCG/ACT (0.025%)  SOLN Place 2 sprays into the nose 2 (two) times daily. 1 Bottle 5   fluticasone (FLOVENT HFA) 110 MCG/ACT inhaler Inhale 2 puffs into the lungs 2 (two) times daily. 1 Inhaler 5   levothyroxine (SYNTHROID) 100 MCG tablet TAKE 1 TABLET BY MOUTH EVERY DAY BEFORE BREAKFAST 90 tablet 0   montelukast (SINGULAIR) 10 MG tablet Take 1 tablet (10 mg total) by mouth at bedtime. 30 tablet 5   olmesartan (BENICAR) 40 MG tablet Take 1 tablet (40 mg total) by mouth daily. 30 tablet 2   pravastatin (PRAVACHOL) 20 MG tablet Take 1 tablet (20 mg total) by mouth daily. 90 tablet 3   risedronate (ACTONEL) 35 MG tablet Take 1 tablet (35 mg total) by mouth every 7 (seven) days. with water on empty stomach, nothing by mouth or lie down for next 30 minutes. 12 tablet 1   VENTOLIN HFA 108 (90 Base) MCG/ACT inhaler 1 PUFF EVERY 6 HOURS UNTIL DIRECTED TO STOP     Vitamin D, Ergocalciferol, (DRISDOL) 1.25 MG (50000 UT) CAPS capsule TAKE 1 CAPSULE (50,000 UNITS TOTAL) BY MOUTH EVERY 7 (SEVEN) DAYS. 12 capsule 1   No current facility-administered medications on file prior to visit.      Objective:  Objective  Physical Exam Vitals signs and nursing note reviewed.  Constitutional:      Appearance: She is well-developed.  HENT:     Head: Normocephalic and atraumatic.  Eyes:     Conjunctiva/sclera: Conjunctivae normal.  Neck:     Musculoskeletal: Normal range of motion and neck supple.     Thyroid:  No thyromegaly.     Vascular: No carotid bruit or JVD.  Cardiovascular:     Rate and Rhythm: Normal rate and regular rhythm.     Heart sounds: Normal heart sounds. No murmur.  Pulmonary:     Effort: Pulmonary effort is normal. No respiratory distress.     Breath sounds: Normal breath sounds. No wheezing or rales.  Chest:     Chest wall: No tenderness.  Neurological:     Mental Status: She is alert and oriented to person, place, and time.    BP 118/80 (BP Location: Left Arm, Patient Position: Sitting, Cuff  Size: Normal)    Pulse 75    Temp (!) 97.1 F (36.2 C) (Temporal)    Resp 18    Ht 5\' 7"  (1.702 m)    Wt 187 lb 12.8 oz (85.2 kg)    SpO2 95%    BMI 29.41 kg/m  Wt Readings from Last 3 Encounters:  05/18/19 187 lb 12.8 oz (85.2 kg)  04/29/19 188 lb 9.6 oz (85.5 kg)  07/01/18 180 lb (81.6 kg)     Lab Results  Component Value Date   WBC 7.3 09/22/2017   HGB 15.5 (H) 09/22/2017   HCT 46.3 (H) 09/22/2017   PLT 274.0 09/22/2017   GLUCOSE 94 02/04/2019   CHOL 168 02/04/2019   TRIG 156.0 (H) 02/04/2019   HDL 43.70 02/04/2019   LDLCALC 94 02/04/2019   ALT 26 02/04/2019   AST 24 02/04/2019   NA 140 02/04/2019   K 4.5 02/04/2019   CL 104 02/04/2019   CREATININE 0.92 02/04/2019   BUN 12 02/04/2019   CO2 26 02/04/2019   TSH 1.69 02/04/2019    Ct Coronary Morph W/cta Cor W/score W/ca W/cm &/or Wo/cm  Addendum Date: 08/13/2018   ADDENDUM REPORT: 08/13/2018 19:11 HISTORY: Dyspnea on exertion, chest pain EXAM: Cardiac/Coronary  CT TECHNIQUE: The patient was scanned on a [employer]. PROTOCOL: A 120 kV prospective scan was triggered in the descending thoracic aorta at 111 HU's. Axial non-contrast 3 mm slices were carried out through the heart. The data set was analyzed on a dedicated work station and scored using the [place]. Gantry rotation speed was 250 msecs and collimation was .6 mm. No beta blockade and 0.8 mg of sl NTG was given. The 3D data set was reconstructed in 5% intervals of the 67-82 % of the R-R cycle. Diastolic phases were analyzed on a dedicated work station using MPR, MIP and VRT modes. The patient received 80 cc of contrast. FINDINGS: Coronary calcium score: The patient's coronary artery calcium score is 0, which places the patient in the 0 percentile. Coronary arteries: Normal coronary origins.  Right dominance. Right Coronary Artery: No detectable plaque or stenosis. Patent PDA and posterolateral branches. Left Main Coronary Artery: No detectable plaque or  stenosis. Left Anterior Descending Coronary Artery: No detectable plaque or stenosis. Patent diagonal branches. Left Circumflex Artery: No detectable plaque or stenosis. Patent OM branch. Aorta:  Normal size.  No calcifications.  No dissection. Aortic Valve: No calcifications. LV: Grossly normal LV function. Other findings: Normal pulmonary vein drainage into the left atrium. Normal left atrial appendage without a thrombus. Normal size of the pulmonary artery. IMPRESSION: 1. Coronary calcium score of 0. This was 0 percentile for age and sex matched control. 2. Normal coronary origin with right dominance. 3. No evidence of CAD, CADRADS = 0. Electronically Signed   By: Cherlynn Kaiser   On: 08/13/2018 19:11  Result Date: 08/13/2018 EXAM: OVER-READ INTERPRETATION  CT CHEST The following report is an over-read performed by radiologist Dr. Charlett Nose of [facility] on 08/13/2018. This over-read does not include interpretation of cardiac or coronary anatomy or pathology. The coronary CTA interpretation by the cardiologist is attached. COMPARISON:  None. FINDINGS: Vascular: Heart is normal size.  Visualized aorta normal caliber. Mediastinum/Nodes: No adenopathy in the lower mediastinum or hila. Lungs/Pleura: No confluent airspace opacities or effusions. Upper Abdomen: Imaging into the upper abdomen shows no acute findings. Musculoskeletal: Chest wall soft tissues are unremarkable. Bilateral breast implants noted. No acute bony abnormality. IMPRESSION: No acute or significant extracardiac abnormality. Electronically Signed: By: Charlett Nose M.D. On: 08/13/2018 13:25     Assessment & Plan:  Plan  I am having Kathryn Boyer maintain her flunisolide, estradiol, cyclobenzaprine, Vitamin D (Ergocalciferol), Ventolin HFA, montelukast, fluticasone, pravastatin, risedronate, famotidine, levothyroxine, olmesartan, estradiol, and ALPRAZolam.  Meds ordered this encounter  Medications   ALPRAZolam (XANAX)  0.25 MG tablet    Sig: Take 1 tablet (0.25 mg total) by mouth at bedtime as needed for anxiety.    Dispense:  30 tablet    Refill:  1    Not to exceed 5 additional fills before 06/29/2019 DX Code Needed  WE HAVE NO RECORD OF RECEIVING THIS RX.    Problem List Items Addressed This Visit      Unprioritized   Essential hypertension    Well controlled, no changes to meds. Encouraged heart healthy diet such as the DASH diet and exercise as tolerated.        Generalized anxiety disorder   Relevant Medications   ALPRAZolam (XANAX) 0.25 MG tablet   Morbid obesity (HCC)    D/w pt diet and exercise Recheck 3 months      Thoracic aorta atherosclerosis (HCC)    Other Visit Diagnoses    Need for vaccination    -  Primary   Relevant Orders   Varicella-zoster vaccine IM (Shingrix) (Completed)   Morbid obesity due to excess calories (HCC)   (Chronic)        Follow-up: Return in about 3 months (around 08/17/2019), or if symptoms worsen or fail to improve, for hypertension.  Donato Schultz, DO

## 2019-05-18 NOTE — Assessment & Plan Note (Signed)
Well controlled, no changes to meds. Encouraged heart healthy diet such as the DASH diet and exercise as tolerated.  °

## 2019-05-25 ENCOUNTER — Other Ambulatory Visit: Payer: Self-pay | Admitting: Family Medicine

## 2019-05-25 DIAGNOSIS — Z780 Asymptomatic menopausal state: Secondary | ICD-10-CM

## 2019-05-25 DIAGNOSIS — R350 Frequency of micturition: Secondary | ICD-10-CM

## 2019-06-08 ENCOUNTER — Other Ambulatory Visit: Payer: Self-pay | Admitting: Family Medicine

## 2019-06-15 ENCOUNTER — Other Ambulatory Visit: Payer: Self-pay | Admitting: Family Medicine

## 2019-06-15 DIAGNOSIS — M810 Age-related osteoporosis without current pathological fracture: Secondary | ICD-10-CM

## 2019-06-18 ENCOUNTER — Other Ambulatory Visit: Payer: Self-pay | Admitting: Family Medicine

## 2019-06-21 ENCOUNTER — Other Ambulatory Visit: Payer: Self-pay | Admitting: Family Medicine

## 2019-07-08 DIAGNOSIS — C44729 Squamous cell carcinoma of skin of left lower limb, including hip: Secondary | ICD-10-CM | POA: Diagnosis not present

## 2019-07-08 DIAGNOSIS — D225 Melanocytic nevi of trunk: Secondary | ICD-10-CM | POA: Diagnosis not present

## 2019-07-08 DIAGNOSIS — L578 Other skin changes due to chronic exposure to nonionizing radiation: Secondary | ICD-10-CM | POA: Diagnosis not present

## 2019-07-08 DIAGNOSIS — L821 Other seborrheic keratosis: Secondary | ICD-10-CM | POA: Diagnosis not present

## 2019-07-08 DIAGNOSIS — L814 Other melanin hyperpigmentation: Secondary | ICD-10-CM | POA: Diagnosis not present

## 2019-07-08 DIAGNOSIS — L570 Actinic keratosis: Secondary | ICD-10-CM | POA: Diagnosis not present

## 2019-08-02 ENCOUNTER — Other Ambulatory Visit: Payer: Self-pay | Admitting: Family Medicine

## 2019-08-02 DIAGNOSIS — F411 Generalized anxiety disorder: Secondary | ICD-10-CM

## 2019-08-02 MED ORDER — ALPRAZOLAM 0.25 MG PO TABS: 0.2500 mg | ORAL_TABLET | Freq: Every evening | ORAL | 1 refills | Status: DC | PRN

## 2019-08-02 NOTE — Telephone Encounter (Signed)
Requesting: Xanax Contract: 11/18/2017 UDS: 11/18/2017 Last OV: 05/18/2019 Next OV: 08/17/2019 Last Refill: 05/18/2019, #30--0 RF Database:   Please advise

## 2019-08-02 NOTE — Telephone Encounter (Signed)
Copied from [place] [id number]. Topic: Quick Communication - Rx Refill/Question >> Aug 02, 2019 10:13 AM Parke Poisson wrote: Medication: Alprazolam .25  Has the patient contacted their pharmacy? Yes  (Agent: If yes, when and what did the pharmacy advise. To contact office  Preferred Pharmacy (with phone number or street name): [id number] - [place], [place] [phone] (Phone) [phone] (Fax)    Agent: Please be advised that RX refills may take up to 3 business days. We ask that you follow-up with your pharmacy.

## 2019-08-14 ENCOUNTER — Other Ambulatory Visit: Payer: Self-pay | Admitting: Family Medicine

## 2019-08-14 DIAGNOSIS — I10 Essential (primary) hypertension: Secondary | ICD-10-CM

## 2019-08-17 ENCOUNTER — Ambulatory Visit: Payer: BC Managed Care – PPO | Admitting: Family Medicine

## 2019-09-03 ENCOUNTER — Other Ambulatory Visit: Payer: Self-pay

## 2019-09-06 ENCOUNTER — Other Ambulatory Visit: Payer: Self-pay

## 2019-09-06 ENCOUNTER — Encounter: Payer: Self-pay | Admitting: Family Medicine

## 2019-09-06 ENCOUNTER — Ambulatory Visit (INDEPENDENT_AMBULATORY_CARE_PROVIDER_SITE_OTHER): Payer: BC Managed Care – PPO | Admitting: Family Medicine

## 2019-09-06 DIAGNOSIS — I10 Essential (primary) hypertension: Secondary | ICD-10-CM

## 2019-09-06 DIAGNOSIS — E039 Hypothyroidism, unspecified: Secondary | ICD-10-CM

## 2019-09-06 MED ORDER — OLMESARTAN MEDOXOMIL 40 MG PO TABS: 40.0000 mg | ORAL_TABLET | Freq: Every day | ORAL | 0 refills | Status: DC

## 2019-09-06 MED ORDER — LEVOTHYROXINE SODIUM 100 MCG PO TABS: ORAL_TABLET | ORAL | 1 refills | Status: DC

## 2019-09-06 NOTE — Progress Notes (Signed)
Virtual Visit via Video Note  I connected with Kathryn Boyer on 09/06/19 at  9:00 AM EST by a video enabled telemedicine application and verified that I am speaking with the correct person using two identifiers.  Location: Patient: home alone Provider: home    I discussed the limitations of evaluation and management by telemedicine and the availability of in person appointments. The patient expressed understanding and agreed to proceed.  History of Present Illness: Pt is home with no complaints She needs refills and will schedule labs    Observations/Objective: There were no vitals filed for this visit. Pt will send bp via my chart  Pt is in NAD   Assessment and Plan: 1. Essential hypertension Well controlled, no changes to meds. Encouraged heart healthy diet such as the DASH diet and exercise as tolerated.  Pt states bp has been good-- she will check it later and let us know what it is  Check labs  - olmesartan (BENICAR) 40 MG tablet; Take 1 tablet (40 mg total) by mouth daily.  Dispense: 90 tablet; Refill: 0  2. Hypothyroidism, unspecified type Check labs  - levothyroxine (SYNTHROID) 100 MCG tablet; 1 po qd  Dispense: 90 tablet; Refill: 1   Follow Up Instructions:    I discussed the assessment and treatment plan with the patient. The patient was provided an opportunity to ask questions and all were answered. The patient agreed with the plan and demonstrated an understanding of the instructions.   The patient was advised to call back or seek an in-person evaluation if the symptoms worsen or if the condition fails to improve as anticipated.    Donato Schultz, DO

## 2019-09-07 DIAGNOSIS — F4323 Adjustment disorder with mixed anxiety and depressed mood: Secondary | ICD-10-CM | POA: Diagnosis not present

## 2019-09-10 ENCOUNTER — Other Ambulatory Visit: Payer: Self-pay | Admitting: Family Medicine

## 2019-10-05 ENCOUNTER — Encounter: Payer: Self-pay | Admitting: Family Medicine

## 2019-10-06 ENCOUNTER — Telehealth: Payer: Self-pay | Admitting: Family Medicine

## 2019-10-06 NOTE — Telephone Encounter (Signed)
Please contact patient for an appointment per [facility].

## 2019-10-06 NOTE — Telephone Encounter (Signed)
Called patient, states that she is having sinus headaches and pressure, teeth and cheeks hurt, ears are stopped up. Denies fever.  "Didn't know if she needed an abx or steroids". Taking Zyrtec,Nasacort, her inhalers, NSL and Advil. Last OV 09/2018. Please advise.

## 2019-10-06 NOTE — Telephone Encounter (Signed)
Patient has been  having headaches for 2-3 weeks. She would like to do a televisit, but [facility] is full. I offered Dr. Selena Batten on Friday and she declined. She would like a nurse to call her back.

## 2019-10-06 NOTE — Telephone Encounter (Signed)
Can you please make an appointment for this patient tomorrow? Can be telephone. Thank you

## 2019-10-11 ENCOUNTER — Other Ambulatory Visit (INDEPENDENT_AMBULATORY_CARE_PROVIDER_SITE_OTHER): Payer: BC Managed Care – PPO

## 2019-10-11 ENCOUNTER — Other Ambulatory Visit: Payer: Self-pay

## 2019-10-11 DIAGNOSIS — I10 Essential (primary) hypertension: Secondary | ICD-10-CM

## 2019-10-11 DIAGNOSIS — E039 Hypothyroidism, unspecified: Secondary | ICD-10-CM

## 2019-10-11 LAB — COMPREHENSIVE METABOLIC PANEL
ALT: 22 U/L (ref 0–35)
AST: 21 U/L (ref 0–37)
Albumin: 4.5 g/dL (ref 3.5–5.2)
Alkaline Phosphatase: 97 U/L (ref 39–117)
BUN: 13 mg/dL (ref 6–23)
CO2: 28 mEq/L (ref 19–32)
Calcium: 9.8 mg/dL (ref 8.4–10.5)
Chloride: 102 mEq/L (ref 96–112)
Creatinine, Ser: 0.94 mg/dL (ref 0.40–1.20)
GFR: 59.86 mL/min — ABNORMAL LOW (ref >60.00)
Glucose, Bld: 99 mg/dL (ref 70–99)
Potassium: 4.3 mEq/L (ref 3.5–5.1)
Sodium: 140 mEq/L (ref 135–145)
Total Bilirubin: 0.7 mg/dL (ref 0.2–1.2)
Total Protein: 7.3 g/dL (ref 6.0–8.3)

## 2019-10-11 LAB — LIPID PANEL
Cholesterol: 192 mg/dL (ref 0–200)
HDL: 53 mg/dL (ref >39.00)
LDL Cholesterol: 105 mg/dL — ABNORMAL HIGH (ref 0–99)
NonHDL: 138.52
Total CHOL/HDL Ratio: 4
Triglycerides: 170 mg/dL — ABNORMAL HIGH (ref 0.0–149.0)
VLDL: 34 mg/dL (ref 0.0–40.0)

## 2019-10-11 LAB — TSH: TSH: 1.83 u[IU]/mL (ref 0.35–4.50)

## 2019-10-12 ENCOUNTER — Encounter: Payer: Self-pay | Admitting: Family Medicine

## 2019-10-12 ENCOUNTER — Ambulatory Visit (INDEPENDENT_AMBULATORY_CARE_PROVIDER_SITE_OTHER): Payer: BC Managed Care – PPO | Admitting: Family Medicine

## 2019-10-12 VITALS — BP 128/76 | HR 68 | Temp 96.0°F | Resp 18 | Ht 67.5 in | Wt 184.1 lb

## 2019-10-12 DIAGNOSIS — K219 Gastro-esophageal reflux disease without esophagitis: Secondary | ICD-10-CM

## 2019-10-12 DIAGNOSIS — J302 Other seasonal allergic rhinitis: Secondary | ICD-10-CM | POA: Diagnosis not present

## 2019-10-12 DIAGNOSIS — J3089 Other allergic rhinitis: Secondary | ICD-10-CM | POA: Diagnosis not present

## 2019-10-12 DIAGNOSIS — J4531 Mild persistent asthma with (acute) exacerbation: Secondary | ICD-10-CM | POA: Diagnosis not present

## 2019-10-12 MED ORDER — VENTOLIN HFA 108 (90 BASE) MCG/ACT IN AERS: 2.0000 | INHALATION_SPRAY | RESPIRATORY_TRACT | 1 refills | Status: DC | PRN

## 2019-10-12 MED ORDER — FLOVENT HFA 110 MCG/ACT IN AERO: 2.0000 | INHALATION_SPRAY | Freq: Two times a day (BID) | RESPIRATORY_TRACT | 5 refills | Status: DC

## 2019-10-12 MED ORDER — MONTELUKAST SODIUM 10 MG PO TABS: 10.0000 mg | ORAL_TABLET | Freq: Every day | ORAL | 5 refills | Status: DC

## 2019-10-12 MED ORDER — FAMOTIDINE 20 MG PO TABS: 20.0000 mg | ORAL_TABLET | Freq: Two times a day (BID) | ORAL | 5 refills | Status: DC

## 2019-10-12 NOTE — Progress Notes (Signed)
[address] [place] [address] Dept: [phone]  FOLLOW UP NOTE  Patient ID: Kathryn Boyer, female    DOB: 1956-03-19  Age: 64 y.o. MRN: [id number] Date of Office Visit: 10/12/2019  Assessment  Chief Complaint: Headache, Nasal Congestion, and Sinusitis  HPI Kathryn Boyer is a 64 year old female who presents to the clinic for follow-up visit.  She was last seen in this clinic on 10/13/2018 for evaluation of asthma, allergic rhinitis, reflux, acute sinusitis, and eustachian tube dysfunction.  At today's visit she reports that for the last 2 to 3 weeks she has been experiencing pain, pressure, and headache above and below her eyes.  Over the last several days the pain began to move into the back part of her head and she increased her blood pressure medication and started Zyrtec with moderate relief of symptoms.  She reports that for the last 2 or 3 weeks she has been experiencing clear nasal drainage, dry nostrils, and dried blood from both nostrils with the left more than the right.  She reports that she had a cauterization in the left nostril at [facility] several years ago.  She is currently using Nasacort occasionally, cetirizine once a day, and occasionally a Nettie pot.  She is not taking the Mucinex.  She does report that she has 2 dogs that sleep on her bed and seemed to aggravate the symptoms.  She denies thick discolored nasal drainage and fever.  Asthma is reported as moderately well controlled with shortness of breath and wheeze that began yesterday.  She denies any symptoms prior to yesterday.  She denies cough.  She is not currently taking montelukast or Flovent and uses albuterol about 2-4 times a year.  She did use albuterol yesterday with relief of shortness of breath and wheeze.  Reflux is reported as moderately well controlled with heartburn occurring 2 to 3 days a week depending on the food she is eating.  She continues famotidine 20 mg occasionally.  Her current  medications are listed in the chart.   Drug Allergies:  Allergies  Allergen Reactions  . Contrast Media [Iodinated Diagnostic Agents] Hives  . Isovue [Iopamidol] Hives    After returning home pt called to inform technologist of rash to face and neck, denies difficulty breathing or swallowing, pt took 2 benedryl Will need premeds in future  . Amoxicillin Other (See Comments)    Headache   . Belviq Xr [Lorcaserin Hcl Er] Itching    Caused her face to turn red.  Bernie Covey [Liraglutide -Weight Management] Other (See Comments)    Caused indigestion.  Marland Kitchen Penicillins Rash    Physical Exam: BP 128/76 (BP Location: Right Arm, Patient Position: Sitting, Cuff Size: Normal)   Pulse 68   Temp (!) 96 F (35.6 C) (Temporal)   Resp 18   Ht 5' 7.5" (1.715 m)   Wt 184 lb 1.4 oz (83.5 kg)   SpO2 97%   BMI 28.41 kg/m    Physical Exam Vitals reviewed.  Constitutional:      Appearance: Normal appearance. She is well-developed.  HENT:     Head: Normocephalic and atraumatic.     Right Ear: Tympanic membrane normal.     Left Ear: Tympanic membrane normal.     Nose:     Comments: Bilateral nares erythematous with clear nasal drainage.  Left nare edematous and pale.  No dried blood noted.  Septal deviation noted.  Pharynx normal.  Ears with clear effusion.  Eyes normal.  Mouth/Throat:     Pharynx: Oropharynx is clear.  Eyes:     Conjunctiva/sclera: Conjunctivae normal.  Cardiovascular:     Rate and Rhythm: Normal rate and regular rhythm.     Heart sounds: Normal heart sounds. No murmur.  Pulmonary:     Effort: Pulmonary effort is normal.     Breath sounds: Normal breath sounds.     Comments: Lungs clear to auscultation Musculoskeletal:        General: Normal range of motion.     Cervical back: Normal range of motion and neck supple.  Skin:    General: Skin is warm and dry.  Neurological:     Mental Status: She is alert and oriented to person, place, and time.  Psychiatric:         Mood and Affect: Mood normal.        Behavior: Behavior normal.        Thought Content: Thought content normal.        Judgment: Judgment normal.     Diagnostics: FVC 2.88, FEV1 2.31.  Predicted FVC 3.71.  Predicted FEV1 2.85.  Spirometry indicates mild restriction.  This is consistent with previous spirometry readings.  Assessment and Plan: 1. Mild persistent asthma with acute exacerbation   2. Seasonal and perennial allergic rhinitis   3. Gastroesophageal reflux disease, unspecified whether esophagitis present     Meds ordered this encounter  Medications  . famotidine (PEPCID) 20 MG tablet    Sig: Take 1 tablet (20 mg total) by mouth 2 (two) times daily.    Dispense:  60 tablet    Refill:  5    Please keep on file for when the patient is ready to pick up.  . fluticasone (FLOVENT HFA) 110 MCG/ACT inhaler    Sig: Inhale 2 puffs into the lungs 2 (two) times daily.    Dispense:  1 Inhaler    Refill:  5    Please keep on file for when the patient is ready to pick up.  . montelukast (SINGULAIR) 10 MG tablet    Sig: Take 1 tablet (10 mg total) by mouth at bedtime.    Dispense:  30 tablet    Refill:  5    Please keep on file for when the patient is ready to pick up.  . VENTOLIN HFA 108 (90 Base) MCG/ACT inhaler    Sig: Inhale 2 puffs into the lungs every 4 (four) hours as needed for wheezing or shortness of breath.    Dispense:  18 g    Refill:  1    Please keep on file for when the patient is ready to pick up.    Patient Instructions  Allergic rhinitis, unspecified seasonality, unspecified trigger Stop nasal steroid sprays for now Return to [facility] specialist for evaluation and treatment Begin nasal saline rinses as needed for nasal symptoms Begin prednisone 10 mg tablets. Take 2 tablets once a day for 4 days, then take 1 tablet on the 5th day, then stop Continue cetirizine 10 mg once a day as needed for a runny nose Consider nasal saline gel for dry nostrils Continue  avoidance measures directed toward molds, dust mites, and pollens. Handout given Return to update your skin testing. Remember to stop antihistamines for 3 days before testing  Mild intermittent asthma with acute exacerbation Restart montelukast 10 mg once a day to prevent cough or wheeze For now and for aasthma flares or upper respiratory tract infections, begin Flovent 110-2 puffs with a  spacer twice a day for 2 weeks or until cough and wheeze free Continue ProAir 2 puffs as needed for cough or wheeze  Gastroesophageal reflux disease Begin dietary and lifestyle modifications. Handout given Continue famotidine 20 mg twice a day for reflux  Call the clinic if this treatment plan is not working well for you  Follow up in 2 months or sooner if needed   Return in about 2 months (around 12/10/2019), or if symptoms worsen or fail to improve.   Thank you for the opportunity to care for this patient.  Please do not hesitate to contact me with questions.  Gareth Morgan, FNP [facility]  I have provided oversight concerning Gareth Morgan' evaluation and treatment of this patient's health issues addressed during today's encounter. I agree with the assessment and therapeutic plan as outlined in the note.   Thank you for the opportunity to care for this patient.  Please do not hesitate to contact me with questions.  Penne Lash, M.D.  [facility] [address] [place], [place] [address] [phone]

## 2019-10-12 NOTE — Patient Instructions (Addendum)
Allergic rhinitis, unspecified seasonality, unspecified trigger Stop nasal steroid sprays for now Return to [facility] ENT specialist for evaluation and treatment Begin nasal saline rinses as needed for nasal symptoms Begin prednisone 10 mg tablets. Take 2 tablets once a day for 4 days, then take 1 tablet on the 5th day, then stop Continue cetirizine 10 mg once a day as needed for a runny nose Consider nasal saline gel for dry nostrils Continue avoidance measures directed toward molds, dust mites, and pollens. Handout given Return to update your skin testing. Remember to stop antihistamines for 3 days before testing  Mild intermittent asthma with acute exacerbation Restart montelukast 10 mg once a day to prevent cough or wheeze For now and for aasthma flares or upper respiratory tract infections, begin Flovent 110-2 puffs with a spacer twice a day for 2 weeks or until cough and wheeze free Continue ProAir 2 puffs as needed for cough or wheeze  Gastroesophageal reflux disease Begin dietary and lifestyle modifications. Handout given Continue famotidine 20 mg twice a day for reflux  Call the clinic if this treatment plan is not working well for you  Follow up in 2 months or sooner if needed

## 2019-10-13 NOTE — Addendum Note (Signed)
Addended by: Berna Bue on: 10/13/2019 12:17 PM   Modules accepted: Orders

## 2019-11-08 DIAGNOSIS — R040 Epistaxis: Secondary | ICD-10-CM | POA: Diagnosis not present

## 2019-11-16 ENCOUNTER — Other Ambulatory Visit: Payer: Self-pay

## 2019-11-16 ENCOUNTER — Ambulatory Visit: Payer: BC Managed Care – PPO

## 2019-11-23 ENCOUNTER — Other Ambulatory Visit: Payer: Self-pay | Admitting: Family Medicine

## 2019-11-23 DIAGNOSIS — M810 Age-related osteoporosis without current pathological fracture: Secondary | ICD-10-CM

## 2020-01-12 ENCOUNTER — Other Ambulatory Visit: Payer: Self-pay | Admitting: Family Medicine

## 2020-01-12 DIAGNOSIS — E785 Hyperlipidemia, unspecified: Secondary | ICD-10-CM

## 2020-01-12 DIAGNOSIS — F411 Generalized anxiety disorder: Secondary | ICD-10-CM

## 2020-01-12 NOTE — Telephone Encounter (Signed)
Medication:ALPRAZolam (XANAX) 0.25 MG   Has the patient contacted their pharmacy? No. (If no, request that the patient contact the pharmacy for the refill.) (If yes, when and what did the pharmacy advise?)  Preferred Pharmacy (with phone number or street name):  [id number] [place], [place] Phone:  [phone]  Fax:  [phone]       Agent: Please be advised that RX refills may take up to 3 business days. We ask that you follow-up with your pharmacy.

## 2020-01-13 MED ORDER — ALPRAZOLAM 0.25 MG PO TABS: 0.2500 mg | ORAL_TABLET | Freq: Every evening | ORAL | 1 refills | Status: DC | PRN

## 2020-01-13 NOTE — Telephone Encounter (Signed)
Requesting: Xanax Contract: 11/18/2017 UDS: 11/18/2017 Last OV: 09/06/2019 Next OV: N/A Last Refill: 08/02/2019, #30--1 RF Database:   Please advise

## 2020-01-25 ENCOUNTER — Encounter: Payer: Self-pay | Admitting: Family

## 2020-01-25 ENCOUNTER — Other Ambulatory Visit: Payer: Self-pay

## 2020-01-25 ENCOUNTER — Ambulatory Visit: Payer: BC Managed Care – PPO | Admitting: Family Medicine

## 2020-01-25 ENCOUNTER — Ambulatory Visit (INDEPENDENT_AMBULATORY_CARE_PROVIDER_SITE_OTHER): Payer: BC Managed Care – PPO | Admitting: Family

## 2020-01-25 VITALS — BP 130/75 | HR 72 | Temp 97.6°F | Resp 16 | Ht 67.5 in | Wt 183.0 lb

## 2020-01-25 DIAGNOSIS — L959 Vasculitis limited to the skin, unspecified: Secondary | ICD-10-CM | POA: Diagnosis not present

## 2020-01-25 DIAGNOSIS — M549 Dorsalgia, unspecified: Secondary | ICD-10-CM | POA: Diagnosis not present

## 2020-01-25 MED ORDER — CYCLOBENZAPRINE HCL 5 MG PO TABS: 5.0000 mg | ORAL_TABLET | Freq: Three times a day (TID) | ORAL | 1 refills | Status: DC | PRN

## 2020-01-25 NOTE — Patient Instructions (Signed)
Please call if the redness worsens or if not improved in the next few weeks.

## 2020-01-25 NOTE — Progress Notes (Signed)
Subjective:    Patient ID: Kathryn Boyer, female    DOB: 12-21-1955, 64 y.o.   MRN: [id number]  HPI   Patient is a 64 yr old female who presents today with chief complaint of rash. Rash is located on both legs and is not painful. She has only had some mild itching.      Review of Systems See HPI  Past Medical History:  Diagnosis Date  . Anxiety   . Asthma   . Hyperlipidemia   . Hypertension   . Plantar fasciitis   . Thyroid disease      Social History   Socioeconomic History  . Marital status: Married    Spouse name: Not on file  . Number of children: Not on file  . Years of education: Not on file  . Highest education level: Not on file  Occupational History  . Not on file  Tobacco Use  . Smoking status: Never Smoker  . Smokeless tobacco: Never Used  Substance and Sexual Activity  . Alcohol use: No  . Drug use: No  . Sexual activity: Not on file  Other Topics Concern  . Not on file  Social History Narrative   Exercise-sometimes   Social Determinants of Health   Financial Resource Strain:   . Difficulty of Paying Living Expenses:   Food Insecurity:   . Worried About Charity fundraiser in the Last Year:   . Arboriculturist in the Last Year:   Transportation Needs:   . Film/video editor (Medical):   Marland Kitchen Lack of Transportation (Non-Medical):   Physical Activity:   . Days of Exercise per Week:   . Minutes of Exercise per Session:   Stress:   . Feeling of Stress :   Social Connections:   . Frequency of Communication with Friends and Family:   . Frequency of Social Gatherings with Friends and Family:   . Attends Religious Services:   . Active Member of Clubs or Organizations:   . Attends Archivist Meetings:   Marland Kitchen Marital Status:   Intimate Partner Violence:   . Fear of Current or Ex-Partner:   . Emotionally Abused:   Marland Kitchen Physically Abused:   . Sexually Abused:     Past Surgical History:  Procedure Laterality Date  . ABDOMINAL  HYSTERECTOMY    . BREAST SURGERY    . NOSE SURGERY      Family History  Problem Relation Age of Onset  . Heart disease Mother   . Stroke Mother   . Hypertension Mother   . Cancer Mother        ovarian  . Aneurysm Father   . Hypertension Father   . Heart disease Father   . Kidney disease Father   . Heart disease Sister     Allergies  Allergen Reactions  . Contrast Media [Iodinated Diagnostic Agents] Hives  . Isovue [Iopamidol] Hives    After returning home pt called to inform technologist of rash to face and neck, denies difficulty breathing or swallowing, pt took 2 benedryl Will need premeds in future  . Amoxicillin Other (See Comments)    Headache   . Belviq Xr [Lorcaserin Hcl Er] Itching    Caused her face to turn red.  Kirke Shaggy [Liraglutide -Weight Management] Other (See Comments)    Caused indigestion.  Marland Kitchen Penicillins Rash    Current Outpatient Medications on File Prior to Visit  Medication Sig Dispense Refill  . ALPRAZolam (XANAX) 0.25 MG  tablet Take 1 tablet (0.25 mg total) by mouth at bedtime as needed for anxiety. 30 tablet 1  . estradiol (ESTRACE) 0.1 MG/GM vaginal cream Place 1 Applicatorful vaginally 3 (three) times a week. 42.5 g 3  . estradiol (VIVELLE-DOT) 0.025 MG/24HR PLACE 1 PATCH ONTO THE SKIN 2 (TWO) TIMES A WEEK. 24 patch 5  . famotidine (PEPCID) 20 MG tablet Take 1 tablet (20 mg total) by mouth 2 (two) times daily. 60 tablet 5  . flunisolide (NASALIDE) 25 MCG/ACT (0.025%) SOLN Place 2 sprays into the nose 2 (two) times daily. 1 Bottle 5  . fluticasone (FLOVENT HFA) 110 MCG/ACT inhaler Inhale 2 puffs into the lungs 2 (two) times daily. 1 Inhaler 5  . levothyroxine (SYNTHROID) 100 MCG tablet 1 po qd 90 tablet 1  . montelukast (SINGULAIR) 10 MG tablet Take 1 tablet (10 mg total) by mouth at bedtime. 30 tablet 5  . olmesartan (BENICAR) 40 MG tablet Take 1 tablet (40 mg total) by mouth daily. 90 tablet 0  . pravastatin (PRAVACHOL) 20 MG tablet TAKE 1  TABLET BY MOUTH EVERY DAY 90 tablet 0  . risedronate (ACTONEL) 35 MG tablet TAKE 1 TAB BY MOUTH EVERY 7 DAYS WITH WATER ON EMPTY STOMACH, NOTHING BY MOUTH OR LIE DOWN FOR NEXT 30 MINUTES. 12 tablet 1  . VENTOLIN HFA 108 (90 Base) MCG/ACT inhaler Inhale 2 puffs into the lungs every 4 (four) hours as needed for wheezing or shortness of breath. 18 g 1  . Vitamin D, Ergocalciferol, (DRISDOL) 1.25 MG (50000 UT) CAPS capsule TAKE 1 CAPSULE (50,000 UNITS TOTAL) BY MOUTH EVERY 7 (SEVEN) DAYS. 12 capsule 1   No current facility-administered medications on file prior to visit.    BP 130/75   Pulse 72   Temp 97.6 F (36.4 C) (Temporal)   Resp 16   Ht 5' 7.5" (1.715 m)   Wt 183 lb (83 kg)   SpO2 98%   BMI 28.24 kg/m       Objective:   Physical Exam Constitutional:      Appearance: She is well-developed.  Neck:     Thyroid: No thyromegaly.  Cardiovascular:     Rate and Rhythm: Normal rate and regular rhythm.     Heart sounds: Normal heart sounds. No murmur.  Pulmonary:     Effort: Pulmonary effort is normal. No respiratory distress.     Breath sounds: Normal breath sounds. No wheezing.  Musculoskeletal:     Cervical back: Neck supple.  Skin:    General: Skin is warm and dry.     Comments: Erythematous skin rash RLE and LLE as seen in photos  Neurological:     Mental Status: She is alert and oriented to person, place, and time.  Psychiatric:        Behavior: Behavior normal.        Thought Content: Thought content normal.        Judgment: Judgment normal.             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 I advised the pt to let me know if she develops blisters or pruritis.  Discussed that this is a common rash in the summer and is self limited.   Chronic back pain- reports that she is out of flexeril and is requesting a refill.  This visit occurred during the SARS-CoV-2 public health emergency.  Safety protocols were in  place, including screening questions prior to the visit, additional  usage of staff PPE, and extensive cleaning of exam room while observing appropriate contact time as indicated for disinfecting solutions.

## 2020-02-10 ENCOUNTER — Other Ambulatory Visit: Payer: Self-pay | Admitting: Family Medicine

## 2020-02-10 DIAGNOSIS — E039 Hypothyroidism, unspecified: Secondary | ICD-10-CM

## 2020-02-15 ENCOUNTER — Telehealth: Payer: Self-pay

## 2020-02-15 DIAGNOSIS — I10 Essential (primary) hypertension: Secondary | ICD-10-CM

## 2020-02-15 MED ORDER — OLMESARTAN MEDOXOMIL 40 MG PO TABS: 40.0000 mg | ORAL_TABLET | Freq: Every day | ORAL | 0 refills | Status: DC

## 2020-02-15 NOTE — Telephone Encounter (Signed)
Rx has been sent. Pt is due for a visit w/ Dr. Laury Axon- please schedule at her earliest convenience. TY.

## 2020-02-15 NOTE — Telephone Encounter (Signed)
Pt scheduled for 6 mo f/u on 02/24/2020.

## 2020-02-15 NOTE — Telephone Encounter (Signed)
Pt stated her pharmacy has now sent over 3 requests for her Olmesartan 40 MG tab refill.  Pt is out of her medication.  Pharmacy is [facility].

## 2020-02-17 DIAGNOSIS — L728 Other follicular cysts of the skin and subcutaneous tissue: Secondary | ICD-10-CM | POA: Diagnosis not present

## 2020-02-17 DIAGNOSIS — I781 Nevus, non-neoplastic: Secondary | ICD-10-CM | POA: Diagnosis not present

## 2020-02-24 ENCOUNTER — Ambulatory Visit (INDEPENDENT_AMBULATORY_CARE_PROVIDER_SITE_OTHER): Payer: BC Managed Care – PPO | Admitting: Family Medicine

## 2020-02-24 ENCOUNTER — Other Ambulatory Visit: Payer: Self-pay

## 2020-02-24 ENCOUNTER — Encounter: Payer: Self-pay | Admitting: Family Medicine

## 2020-02-24 VITALS — BP 110/80 | HR 78 | Temp 97.4°F | Resp 18 | Ht 67.5 in | Wt 182.8 lb

## 2020-02-24 DIAGNOSIS — I10 Essential (primary) hypertension: Secondary | ICD-10-CM

## 2020-02-24 DIAGNOSIS — E785 Hyperlipidemia, unspecified: Secondary | ICD-10-CM

## 2020-02-24 DIAGNOSIS — F411 Generalized anxiety disorder: Secondary | ICD-10-CM | POA: Diagnosis not present

## 2020-02-24 DIAGNOSIS — F4322 Adjustment disorder with anxiety: Secondary | ICD-10-CM

## 2020-02-24 DIAGNOSIS — E559 Vitamin D deficiency, unspecified: Secondary | ICD-10-CM | POA: Diagnosis not present

## 2020-02-24 DIAGNOSIS — E039 Hypothyroidism, unspecified: Secondary | ICD-10-CM

## 2020-02-24 LAB — LIPID PANEL
Cholesterol: 179 mg/dL (ref 0–200)
HDL: 50 mg/dL (ref >39.00)
LDL Cholesterol: 104 mg/dL — ABNORMAL HIGH (ref 0–99)
NonHDL: 129.13
Total CHOL/HDL Ratio: 4
Triglycerides: 125 mg/dL (ref 0.0–149.0)
VLDL: 25 mg/dL (ref 0.0–40.0)

## 2020-02-24 LAB — COMPREHENSIVE METABOLIC PANEL
ALT: 23 U/L (ref 0–35)
AST: 22 U/L (ref 0–37)
Albumin: 4.7 g/dL (ref 3.5–5.2)
Alkaline Phosphatase: 84 U/L (ref 39–117)
BUN: 19 mg/dL (ref 6–23)
CO2: 29 mEq/L (ref 19–32)
Calcium: 9.9 mg/dL (ref 8.4–10.5)
Chloride: 103 mEq/L (ref 96–112)
Creatinine, Ser: 0.93 mg/dL (ref 0.40–1.20)
GFR: 60.53 mL/min (ref >60.00)
Glucose, Bld: 91 mg/dL (ref 70–99)
Potassium: 4.5 mEq/L (ref 3.5–5.1)
Sodium: 139 mEq/L (ref 135–145)
Total Bilirubin: 0.6 mg/dL (ref 0.2–1.2)
Total Protein: 7.3 g/dL (ref 6.0–8.3)

## 2020-02-24 LAB — VITAMIN D 25 HYDROXY (VIT D DEFICIENCY, FRACTURES): VITD: 45.93 ng/mL (ref 30.00–100.00)

## 2020-02-24 LAB — TSH: TSH: 1.12 u[IU]/mL (ref 0.35–4.50)

## 2020-02-24 MED ORDER — OLMESARTAN MEDOXOMIL 40 MG PO TABS: 40.0000 mg | ORAL_TABLET | Freq: Every day | ORAL | 1 refills | Status: DC

## 2020-02-24 MED ORDER — PRAVASTATIN SODIUM 20 MG PO TABS: 20.0000 mg | ORAL_TABLET | Freq: Every day | ORAL | 1 refills | Status: DC

## 2020-02-24 MED ORDER — ALPRAZOLAM 0.25 MG PO TABS: 0.2500 mg | ORAL_TABLET | Freq: Every evening | ORAL | 1 refills | Status: DC | PRN

## 2020-02-24 NOTE — Assessment & Plan Note (Signed)
controlled 

## 2020-02-24 NOTE — Assessment & Plan Note (Signed)
Check labs 

## 2020-02-24 NOTE — Progress Notes (Signed)
Patient ID: Kathryn Boyer, female    DOB: 1956/05/05  Age: 64 y.o. MRN: [id number]    Subjective:  Subjective  HPI [name] presents for f/u , med refills an labs  No complaints   Review of Systems  Constitutional: Negative for appetite change, diaphoresis, fatigue and unexpected weight change.  Eyes: Negative for pain, redness and visual disturbance.  Respiratory: Negative for cough, chest tightness, shortness of breath and wheezing.   Cardiovascular: Negative for chest pain, palpitations and leg swelling.  Endocrine: Negative for cold intolerance, heat intolerance, polydipsia, polyphagia and polyuria.  Genitourinary: Negative for difficulty urinating, dysuria and frequency.  Neurological: Negative for dizziness, light-headedness, numbness and headaches.    History Past Medical History:  Diagnosis Date  . Anxiety   . Asthma   . Hyperlipidemia   . Hypertension   . Plantar fasciitis   . Thyroid disease     She has a past surgical history that includes Abdominal hysterectomy; Nose surgery; and Breast surgery.   Her family history includes Aneurysm in her father; Cancer in her mother; Heart disease in her father, mother, and sister; Hypertension in her father and mother; Kidney disease in her father; Stroke in her mother.She reports that she has never smoked. She has never used smokeless tobacco. She reports that she does not drink alcohol and does not use drugs.  Current Outpatient Medications on File Prior to Visit  Medication Sig Dispense Refill  . cyclobenzaprine (FLEXERIL) 5 MG tablet Take 1 tablet (5 mg total) by mouth 3 (three) times daily as needed for muscle spasms. 30 tablet 1  . estradiol (ESTRACE) 0.1 MG/GM vaginal cream Place 1 Applicatorful vaginally 3 (three) times a week. 42.5 g 3  . estradiol (VIVELLE-DOT) 0.025 MG/24HR PLACE 1 PATCH ONTO THE SKIN 2 (TWO) TIMES A WEEK. 24 patch 5  . famotidine (PEPCID) 20 MG tablet Take 1 tablet (20 mg total) by mouth 2 (two)  times daily. 60 tablet 5  . flunisolide (NASALIDE) 25 MCG/ACT (0.025%) SOLN Place 2 sprays into the nose 2 (two) times daily. 1 Bottle 5  . fluticasone (FLOVENT HFA) 110 MCG/ACT inhaler Inhale 2 puffs into the lungs 2 (two) times daily. 1 Inhaler 5  . levothyroxine (SYNTHROID) 100 MCG tablet TAKE 1 TABLET BY MOUTH EVERY DAY 90 tablet 1  . montelukast (SINGULAIR) 10 MG tablet Take 1 tablet (10 mg total) by mouth at bedtime. 30 tablet 5  . risedronate (ACTONEL) 35 MG tablet TAKE 1 TAB BY MOUTH EVERY 7 DAYS WITH WATER ON EMPTY STOMACH, NOTHING BY MOUTH OR LIE DOWN FOR NEXT 30 MINUTES. 12 tablet 1  . VENTOLIN HFA 108 (90 Base) MCG/ACT inhaler Inhale 2 puffs into the lungs every 4 (four) hours as needed for wheezing or shortness of breath. 18 g 1  . Vitamin D, Ergocalciferol, (DRISDOL) 1.25 MG (50000 UT) CAPS capsule TAKE 1 CAPSULE (50,000 UNITS TOTAL) BY MOUTH EVERY 7 (SEVEN) DAYS. 12 capsule 1   No current facility-administered medications on file prior to visit.     Objective:  Objective  Physical Exam Vitals and nursing note reviewed.  Constitutional:      Appearance: She is well-developed.  HENT:     Head: Normocephalic and atraumatic.  Eyes:     Conjunctiva/sclera: Conjunctivae normal.  Neck:     Thyroid: No thyromegaly.     Vascular: No carotid bruit or JVD.  Cardiovascular:     Rate and Rhythm: Normal rate and regular rhythm.     Heart sounds:  Normal heart sounds. No murmur heard.   Pulmonary:     Effort: Pulmonary effort is normal. No respiratory distress.     Breath sounds: Normal breath sounds. No wheezing or rales.  Chest:     Chest wall: No tenderness.  Musculoskeletal:     Cervical back: Normal range of motion and neck supple.  Neurological:     Mental Status: She is alert and oriented to person, place, and time.    BP 110/80 (BP Location: Left Arm, Patient Position: Sitting, Cuff Size: Normal)   Pulse 78   Temp (!) 97.4 F (36.3 C) (Temporal)   Resp 18   Ht 5'  7.5" (1.715 m)   Wt 182 lb 12.8 oz (82.9 kg)   SpO2 93%   BMI 28.21 kg/m  Wt Readings from Last 3 Encounters:  02/24/20 182 lb 12.8 oz (82.9 kg)  01/25/20 183 lb (83 kg)  10/12/19 184 lb 1.4 oz (83.5 kg)     Lab Results  Component Value Date   WBC 7.3 09/22/2017   HGB 15.5 (H) 09/22/2017   HCT 46.3 (H) 09/22/2017   PLT 274.0 09/22/2017   GLUCOSE 99 10/11/2019   CHOL 192 10/11/2019   TRIG 170.0 (H) 10/11/2019   HDL 53.00 10/11/2019   LDLCALC 105 (H) 10/11/2019   ALT 22 10/11/2019   AST 21 10/11/2019   NA 140 10/11/2019   K 4.3 10/11/2019   CL 102 10/11/2019   CREATININE 0.94 10/11/2019   BUN 13 10/11/2019   CO2 28 10/11/2019   TSH 1.83 10/11/2019    CT CORONARY MORPH W/CTA COR W/SCORE W/CA W/CM &/OR WO/CM  Addendum Date: 08/13/2018   ADDENDUM REPORT: 08/13/2018 19:11 HISTORY: Dyspnea on exertion, chest pain EXAM: Cardiac/Coronary  CT TECHNIQUE: The patient was scanned on a [employer]. PROTOCOL: A 120 kV prospective scan was triggered in the descending thoracic aorta at 111 HU's. Axial non-contrast 3 mm slices were carried out through the heart. The data set was analyzed on a dedicated work station and scored using the Agatson method. Gantry rotation speed was 250 msecs and collimation was .6 mm. No beta blockade and 0.8 mg of sl NTG was given. The 3D data set was reconstructed in 5% intervals of the 67-82 % of the R-R cycle. Diastolic phases were analyzed on a dedicated work station using MPR, MIP and VRT modes. The patient received 80 cc of contrast. FINDINGS: Coronary calcium score: The patient's coronary artery calcium score is 0, which places the patient in the 0 percentile. Coronary arteries: Normal coronary origins.  Right dominance. Right Coronary Artery: No detectable plaque or stenosis. Patent PDA and posterolateral branches. Left Main Coronary Artery: No detectable plaque or stenosis. Left Anterior Descending Coronary Artery: No detectable plaque or stenosis.  Patent diagonal branches. Left Circumflex Artery: No detectable plaque or stenosis. Patent OM branch. Aorta:  Normal size.  No calcifications.  No dissection. Aortic Valve: No calcifications. LV: Grossly normal LV function. Other findings: Normal pulmonary vein drainage into the left atrium. Normal left atrial appendage without a thrombus. Normal size of the pulmonary artery. IMPRESSION: 1. Coronary calcium score of 0. This was 0 percentile for age and sex matched control. 2. Normal coronary origin with right dominance. 3. No evidence of CAD, CADRADS = 0. Electronically Signed   By: Weston Brass   On: 08/13/2018 19:11   Result Date: 08/13/2018 EXAM: OVER-READ INTERPRETATION  CT CHEST The following report is an over-read performed by radiologist Dr. Charlett Nose  of [facility], [place] on 08/13/2018. This over-read does not include interpretation of cardiac or coronary anatomy or pathology. The coronary CTA interpretation by the cardiologist is attached. COMPARISON:  None. FINDINGS: Vascular: Heart is normal size.  Visualized aorta normal caliber. Mediastinum/Nodes: No adenopathy in the lower mediastinum or hila. Lungs/Pleura: No confluent airspace opacities or effusions. Upper Abdomen: Imaging into the upper abdomen shows no acute findings. Musculoskeletal: Chest wall soft tissues are unremarkable. Bilateral breast implants noted. No acute bony abnormality. IMPRESSION: No acute or significant extracardiac abnormality. Electronically Signed: By: Charlett Nose M.D. On: 08/13/2018 13:25     Assessment & Plan:  Plan  I have changed Haneen Mcnulty's pravastatin. I am also having her maintain her flunisolide, estradiol, Vitamin D (Ergocalciferol), estradiol, famotidine, Flovent HFA, montelukast, Ventolin HFA, risedronate, cyclobenzaprine, levothyroxine, ALPRAZolam, and olmesartan.  Meds ordered this encounter  Medications  . ALPRAZolam (XANAX) 0.25 MG tablet    Sig: Take 1 tablet (0.25 mg total) by mouth  at bedtime as needed for anxiety.    Dispense:  30 tablet    Refill:  1    Not to exceed 5 additional fills before 06/29/2019 DX Code Needed  . pravastatin (PRAVACHOL) 20 MG tablet    Sig: Take 1 tablet (20 mg total) by mouth daily.    Dispense:  90 tablet    Refill:  1  . olmesartan (BENICAR) 40 MG tablet    Sig: Take 1 tablet (40 mg total) by mouth daily.    Dispense:  90 tablet    Refill:  1    Problem List Items Addressed This Visit      Unprioritized   Adjustment disorder with anxiety    controlled      Essential hypertension    Well controlled, no changes to meds. Encouraged heart healthy diet such as the DASH diet and exercise as tolerated.        Relevant Medications   pravastatin (PRAVACHOL) 20 MG tablet   olmesartan (BENICAR) 40 MG tablet   Generalized anxiety disorder   Relevant Medications   ALPRAZolam (XANAX) 0.25 MG tablet   Other Relevant Orders   Lipid panel   Comprehensive metabolic panel   Hyperlipidemia LDL goal <100    Encouraged heart healthy diet, increase exercise, avoid trans fats, consider a krill oil cap daily      Relevant Medications   pravastatin (PRAVACHOL) 20 MG tablet   olmesartan (BENICAR) 40 MG tablet   Hypothyroidism    Check labs       Relevant Orders   TSH    Other Visit Diagnoses    Dyslipidemia    -  Primary   Relevant Medications   pravastatin (PRAVACHOL) 20 MG tablet   Other Relevant Orders   Lipid panel   Comprehensive metabolic panel   Vitamin D deficiency       Relevant Orders   Vitamin D (25 hydroxy)   Hyperlipidemia, unspecified hyperlipidemia type       Relevant Medications   pravastatin (PRAVACHOL) 20 MG tablet   olmesartan (BENICAR) 40 MG tablet      Follow-up: Return in about 6 months (around 08/26/2020), or if symptoms worsen or fail to improve, for annual exam, fasting.  Donato Schultz, DO

## 2020-02-24 NOTE — Assessment & Plan Note (Signed)
Encouraged heart healthy diet, increase exercise, avoid trans fats, consider a krill oil cap daily 

## 2020-02-24 NOTE — Patient Instructions (Signed)

## 2020-02-24 NOTE — Assessment & Plan Note (Signed)
Well controlled, no changes to meds. Encouraged heart healthy diet such as the DASH diet and exercise as tolerated.  °

## 2020-03-03 ENCOUNTER — Other Ambulatory Visit: Payer: Self-pay | Admitting: Family Medicine

## 2020-03-29 ENCOUNTER — Telehealth (INDEPENDENT_AMBULATORY_CARE_PROVIDER_SITE_OTHER): Payer: BC Managed Care – PPO | Admitting: Family Medicine

## 2020-03-29 ENCOUNTER — Other Ambulatory Visit: Payer: Self-pay

## 2020-03-29 DIAGNOSIS — R1032 Left lower quadrant pain: Secondary | ICD-10-CM | POA: Diagnosis not present

## 2020-03-29 MED ORDER — CIPROFLOXACIN HCL 500 MG PO TABS: 500.0000 mg | ORAL_TABLET | Freq: Two times a day (BID) | ORAL | 0 refills | Status: DC

## 2020-03-29 MED ORDER — METRONIDAZOLE 500 MG PO TABS: 500.0000 mg | ORAL_TABLET | Freq: Three times a day (TID) | ORAL | 0 refills | Status: DC

## 2020-03-29 NOTE — Progress Notes (Signed)
Virtual Visit via Video Note  I connected with Kathryn Boyer on 03/29/20 at 10:40 AM EDT by a video enabled telemedicine application and verified that I am speaking with the correct person using two identifiers.  Location: Patient: home alone  Provider: home    I discussed the limitations of evaluation and management by telemedicine and the availability of in person appointments. The patient expressed understanding and agreed to proceed.  History of Present Illness: Pt is home c/o L side.  She thought is was gas--- week before she had diarrhea.  She just got back from [place]/ [place].  No fevers or blood in stool   Observations/Objective: There were no vitals filed for this visit. There is no height or weight on file to calculate BMI.  pt is nad  Pt c/o LLQ pain esp with movement / walking--- better today  Assessment and Plan: 1. Left lower quadrant abdominal pain Suspect diverticulitis due to diverticula seen on ct in 2019 If pain worsens go to ER Pt will get covid test --- she did have both vaccines  - ciprofloxacin (CIPRO) 500 MG tablet; Take 1 tablet (500 mg total) by mouth 2 (two) times daily.  Dispense: 20 tablet; Refill: 0 - metroNIDAZOLE (FLAGYL) 500 MG tablet; Take 1 tablet (500 mg total) by mouth 3 (three) times daily.  Dispense: 30 tablet; Refill: 0   Follow Up Instructions:    I discussed the assessment and treatment plan with the patient. The patient was provided an opportunity to ask questions and all were answered. The patient agreed with the plan and demonstrated an understanding of the instructions.   The patient was advised to call back or seek an in-person evaluation if the symptoms worsen or if the condition fails to improve as anticipated.  I provided 30 minutes of non-face-to-face time during this encounter.   Donato Schultz, DO

## 2020-04-07 ENCOUNTER — Other Ambulatory Visit: Payer: Self-pay | Admitting: Family Medicine

## 2020-04-07 DIAGNOSIS — E785 Hyperlipidemia, unspecified: Secondary | ICD-10-CM

## 2020-05-03 ENCOUNTER — Other Ambulatory Visit: Payer: Self-pay | Admitting: Family Medicine

## 2020-05-03 ENCOUNTER — Telehealth: Payer: Self-pay | Admitting: Family Medicine

## 2020-05-03 DIAGNOSIS — I10 Essential (primary) hypertension: Secondary | ICD-10-CM

## 2020-05-03 DIAGNOSIS — E785 Hyperlipidemia, unspecified: Secondary | ICD-10-CM

## 2020-05-03 DIAGNOSIS — E039 Hypothyroidism, unspecified: Secondary | ICD-10-CM

## 2020-05-03 MED ORDER — PRAVASTATIN SODIUM 20 MG PO TABS: 20.0000 mg | ORAL_TABLET | Freq: Every day | ORAL | 1 refills | Status: DC

## 2020-05-03 MED ORDER — OLMESARTAN MEDOXOMIL 40 MG PO TABS: 40.0000 mg | ORAL_TABLET | Freq: Every day | ORAL | 1 refills | Status: DC

## 2020-05-03 MED ORDER — LEVOTHYROXINE SODIUM 100 MCG PO TABS: ORAL_TABLET | ORAL | 1 refills | Status: DC

## 2020-05-03 NOTE — Telephone Encounter (Signed)
Medication: olmesartan (BENICAR) 40 MG tablet [[id number]]  pravastatin (PRAVACHOL) 20 MG tablet [[id number]]   levothyroxine (SYNTHROID) 100 MCG tablet [[id number]]      Has the patient contacted their pharmacy?  (If no, request that the patient contact the pharmacy for the refill.) (If yes, when and what did the pharmacy advise?)     Preferred Pharmacy (with phone number or street name): [id number] [place], [place] - [address]  [address] [place] [address]  Phone:  [phone] Fax:  [phone]      Agent: Please be advised that RX refills may take up to 3 business days. We ask that you follow-up with your pharmacy.

## 2020-05-03 NOTE — Telephone Encounter (Signed)
Refill sent.

## 2020-07-13 DIAGNOSIS — D225 Melanocytic nevi of trunk: Secondary | ICD-10-CM | POA: Diagnosis not present

## 2020-07-13 DIAGNOSIS — D1801 Hemangioma of skin and subcutaneous tissue: Secondary | ICD-10-CM | POA: Diagnosis not present

## 2020-07-13 DIAGNOSIS — I781 Nevus, non-neoplastic: Secondary | ICD-10-CM | POA: Diagnosis not present

## 2020-07-13 DIAGNOSIS — L814 Other melanin hyperpigmentation: Secondary | ICD-10-CM | POA: Diagnosis not present

## 2020-08-16 DIAGNOSIS — K621 Rectal polyp: Secondary | ICD-10-CM | POA: Diagnosis not present

## 2020-08-16 DIAGNOSIS — K6389 Other specified diseases of intestine: Secondary | ICD-10-CM | POA: Diagnosis not present

## 2020-08-16 DIAGNOSIS — Z1211 Encounter for screening for malignant neoplasm of colon: Secondary | ICD-10-CM | POA: Diagnosis not present

## 2020-08-16 DIAGNOSIS — Z8371 Family history of colonic polyps: Secondary | ICD-10-CM | POA: Diagnosis not present

## 2020-08-16 DIAGNOSIS — K635 Polyp of colon: Secondary | ICD-10-CM | POA: Diagnosis not present

## 2020-08-16 DIAGNOSIS — D120 Benign neoplasm of cecum: Secondary | ICD-10-CM | POA: Diagnosis not present

## 2020-08-16 DIAGNOSIS — Z86010 Personal history of colonic polyps: Secondary | ICD-10-CM | POA: Diagnosis not present

## 2020-08-16 DIAGNOSIS — K641 Second degree hemorrhoids: Secondary | ICD-10-CM | POA: Diagnosis not present

## 2020-08-16 DIAGNOSIS — K5730 Diverticulosis of large intestine without perforation or abscess without bleeding: Secondary | ICD-10-CM | POA: Diagnosis not present

## 2020-08-31 ENCOUNTER — Encounter: Payer: BC Managed Care – PPO | Admitting: Family Medicine

## 2020-10-19 ENCOUNTER — Other Ambulatory Visit: Payer: Self-pay

## 2020-10-20 ENCOUNTER — Other Ambulatory Visit: Payer: Self-pay

## 2020-10-20 ENCOUNTER — Ambulatory Visit (INDEPENDENT_AMBULATORY_CARE_PROVIDER_SITE_OTHER): Payer: BC Managed Care – PPO | Admitting: Family Medicine

## 2020-10-20 ENCOUNTER — Encounter: Payer: Self-pay | Admitting: Family Medicine

## 2020-10-20 VITALS — BP 122/76 | HR 66 | Temp 98.2°F | Resp 18 | Ht 67.5 in | Wt 182.4 lb

## 2020-10-20 DIAGNOSIS — Z780 Asymptomatic menopausal state: Secondary | ICD-10-CM

## 2020-10-20 DIAGNOSIS — Z23 Encounter for immunization: Secondary | ICD-10-CM

## 2020-10-20 DIAGNOSIS — F411 Generalized anxiety disorder: Secondary | ICD-10-CM

## 2020-10-20 DIAGNOSIS — E559 Vitamin D deficiency, unspecified: Secondary | ICD-10-CM

## 2020-10-20 DIAGNOSIS — I10 Essential (primary) hypertension: Secondary | ICD-10-CM | POA: Diagnosis not present

## 2020-10-20 DIAGNOSIS — E039 Hypothyroidism, unspecified: Secondary | ICD-10-CM

## 2020-10-20 DIAGNOSIS — Z79899 Other long term (current) drug therapy: Secondary | ICD-10-CM

## 2020-10-20 DIAGNOSIS — E785 Hyperlipidemia, unspecified: Secondary | ICD-10-CM

## 2020-10-20 DIAGNOSIS — Z0000 Encounter for general adult medical examination without abnormal findings: Secondary | ICD-10-CM

## 2020-10-20 DIAGNOSIS — E663 Overweight: Secondary | ICD-10-CM

## 2020-10-20 DIAGNOSIS — R350 Frequency of micturition: Secondary | ICD-10-CM | POA: Diagnosis not present

## 2020-10-20 DIAGNOSIS — N644 Mastodynia: Secondary | ICD-10-CM

## 2020-10-20 MED ORDER — OLMESARTAN MEDOXOMIL 40 MG PO TABS: 40.0000 mg | ORAL_TABLET | Freq: Every day | ORAL | 1 refills | Status: DC

## 2020-10-20 MED ORDER — ALPRAZOLAM 0.25 MG PO TABS: 0.2500 mg | ORAL_TABLET | Freq: Every evening | ORAL | 1 refills | Status: AC | PRN

## 2020-10-20 MED ORDER — PRAVASTATIN SODIUM 20 MG PO TABS: 20.0000 mg | ORAL_TABLET | Freq: Every day | ORAL | 1 refills | Status: DC

## 2020-10-20 MED ORDER — ESTRADIOL 0.025 MG/24HR TD PTTW: 1.0000 | MEDICATED_PATCH | TRANSDERMAL | 5 refills | Status: DC

## 2020-10-20 NOTE — Patient Instructions (Signed)
Preventive Care 66-66 Years Old, Female Preventive care refers to lifestyle choices and visits with your health care provider that can promote health and wellness. This includes:  A yearly physical exam. This is also called an annual wellness visit.  Regular dental and eye exams.  Immunizations.  Screening for certain conditions.  Healthy lifestyle choices, such as: ? Eating a healthy diet. ? Getting regular exercise. ? Not using drugs or products that contain nicotine and tobacco. ? Limiting alcohol use. What can I expect for my preventive care visit? Physical exam Your health care provider will check your:  Height and weight. These may be used to calculate your BMI (body mass index). BMI is a measurement that tells if you are at a healthy weight.  Heart rate and blood pressure.  Body temperature.  Skin for abnormal spots. Counseling Your health care provider may ask you questions about your:  Past medical problems.  Family's medical history.  Alcohol, tobacco, and drug use.  Emotional well-being.  Home life and relationship well-being.  Sexual activity.  Diet, exercise, and sleep habits.  Work and work Statistician.  Access to firearms.  Method of birth control.  Menstrual cycle.  Pregnancy history. What immunizations do I need? Vaccines are usually given at various ages, according to a schedule. Your health care provider will recommend vaccines for you based on your age, medical history, and lifestyle or other factors, such as travel or where you work.   What tests do I need? Blood tests  Lipid and cholesterol levels. These may be checked every 66 years, or more often if you are over 66 years old.  Hepatitis C test.  Hepatitis B test. Screening  Lung cancer screening. You may have this screening every year starting at age 66 if you have a 30-pack-year history of smoking and currently smoke or have quit within the past 15 years.  Colorectal cancer  screening. ? All adults should have this screening starting at age 66 and continuing until age 66. ? Your health care provider may recommend screening at age 66 if you are at increased risk. ? You will have tests every 1-10 years, depending on your results and the type of screening test.  Diabetes screening. ? This is done by checking your blood sugar (glucose) after you have not eaten for a while (fasting). ? You may have this done every 1-3 years.  Mammogram. ? This may be done every 1-2 years. ? Talk with your health care provider about when you should start having regular mammograms. This may depend on whether you have a family history of breast cancer.  BRCA-related cancer screening. This may be done if you have a family history of breast, ovarian, tubal, or peritoneal cancers.  Pelvic exam and Pap test. ? This may be done every 3 years starting at age 66. ? Starting at age 66, this may be done every 5 years if you have a Pap test in combination with an HPV test. Other tests  STD (sexually transmitted disease) testing, if you are at risk.  Bone density scan. This is done to screen for osteoporosis. You may have this scan if you are at high risk for osteoporosis. Talk with your health care provider about your test results, treatment options, and if necessary, the need for more tests. Follow these instructions at home: Eating and drinking  Eat a diet that includes fresh fruits and vegetables, whole grains, lean protein, and low-fat dairy products.  Take vitamin and mineral supplements  as recommended by your health care provider.  Do not drink alcohol if: ? Your health care provider tells you not to drink. ? You are pregnant, may be pregnant, or are planning to become pregnant.  If you drink alcohol: ? Limit how much you have to 0-1 drink a day. ? Be aware of how much alcohol is in your drink. In the [place] one drink equals one 12 oz bottle of beer (355 mL), one 5 oz glass of  wine (148 mL), or one 1 oz glass of hard liquor (44 mL).   Lifestyle  Take daily care of your teeth and gums. Brush your teeth every morning and night with fluoride toothpaste. Floss one time each day.  Stay active. Exercise for at least 30 minutes 5 or more days each week.  Do not use any products that contain nicotine or tobacco, such as cigarettes, e-cigarettes, and chewing tobacco. If you need help quitting, ask your health care provider.  Do not use drugs.  If you are sexually active, practice safe sex. Use a condom or other form of protection to prevent STIs (sexually transmitted infections).  If you do not wish to become pregnant, use a form of birth control. If you plan to become pregnant, see your health care provider for a prepregnancy visit.  If told by your health care provider, take low-dose aspirin daily starting at age 66.  Find healthy ways to cope with stress, such as: ? Meditation, yoga, or listening to music. ? Journaling. ? Talking to a trusted person. ? Spending time with friends and family. Safety  Always wear your seat belt while driving or riding in a vehicle.  Do not drive: ? If you have been drinking alcohol. Do not ride with someone who has been drinking. ? When you are tired or distracted. ? While texting.  Wear a helmet and other protective equipment during sports activities.  If you have firearms in your house, make sure you follow all gun safety procedures. What's next?  Visit your health care provider once a year for an annual wellness visit.  Ask your health care provider how often you should have your eyes and teeth checked.  Stay up to date on all vaccines. This information is not intended to replace advice given to you by your health care provider. Make sure you discuss any questions you have with your health care provider. Document Revised: 05/16/2020 Document Reviewed: 04/23/2018 Elsevier Patient Education  2021 [employer]  

## 2020-10-20 NOTE — Progress Notes (Signed)
Subjective:     Kathryn Boyer is a 65 y.o. female and is here for a comprehensive physical exam. The patient reports no problems. Pt also needs refills and f/u bp.    Social History   Socioeconomic History  . Marital status: Married    Spouse name: Not on file  . Number of children: Not on file  . Years of education: Not on file  . Highest education level: Not on file  Occupational History  . Not on file  Tobacco Use  . Smoking status: Never Smoker  . Smokeless tobacco: Never Used  Vaping Use  . Vaping Use: Never used  Substance and Sexual Activity  . Alcohol use: No  . Drug use: No  . Sexual activity: Not on file  Other Topics Concern  . Not on file  Social History Narrative   Exercise-sometimes   Social Determinants of Health   Financial Resource Strain: Not on file  Food Insecurity: Not on file  Transportation Needs: Not on file  Physical Activity: Not on file  Stress: Not on file  Social Connections: Not on file  Intimate Partner Violence: Not on file   Health Maintenance  Topic Date Due  . HIV Screening  Never done  . MAMMOGRAM  10/10/2017  . PNA vac Low Risk Adult (1 of 2 - PCV13) Never done  . COLONOSCOPY (Pts 45-83yrs Insurance coverage will need to be confirmed)  06/05/2025  . TETANUS/TDAP  09/07/2025  . INFLUENZA VACCINE  Completed  . DEXA SCAN  Completed  . COVID-19 Vaccine  Completed  . Hepatitis C Screening  Completed    The following portions of the patient's history were reviewed and updated as appropriate:  She  has a past medical history of Anxiety, Asthma, Hyperlipidemia, Hypertension, Plantar fasciitis, and Thyroid disease. She does not have any pertinent problems on file. She  has a past surgical history that includes Abdominal hysterectomy; Nose surgery; and Breast surgery. Her family history includes Aneurysm in her father; Cancer in her mother; Heart disease in her father, mother, and sister; Hypertension in her father and mother; Kidney  disease in her father; Stroke in her mother. She  reports that she has never smoked. She has never used smokeless tobacco. She reports that she does not drink alcohol and does not use drugs. She has a current medication list which includes the following prescription(s): cyclobenzaprine, estradiol, famotidine, flunisolide, flovent hfa, levothyroxine, montelukast, risedronate, ventolin hfa, vitamin d (ergocalciferol), alprazolam, [START ON 10/23/2020] estradiol, olmesartan, and pravastatin. Current Outpatient Medications on File Prior to Visit  Medication Sig Dispense Refill  . cyclobenzaprine (FLEXERIL) 5 MG tablet Take 1 tablet (5 mg total) by mouth 3 (three) times daily as needed for muscle spasms. 30 tablet 1  . estradiol (ESTRACE) 0.1 MG/GM vaginal cream Place 1 Applicatorful vaginally 3 (three) times a week. 42.5 g 3  . famotidine (PEPCID) 20 MG tablet TAKE 1 TABLET BY MOUTH TWICE DAILY 180 tablet 1  . flunisolide (NASALIDE) 25 MCG/ACT (0.025%) SOLN Place 2 sprays into the nose 2 (two) times daily. 1 Bottle 5  . fluticasone (FLOVENT HFA) 110 MCG/ACT inhaler Inhale 2 puffs into the lungs 2 (two) times daily. 1 Inhaler 5  . levothyroxine (SYNTHROID) 100 MCG tablet TAKE 1 TABLET BY MOUTH EVERY DAY 90 tablet 1  . montelukast (SINGULAIR) 10 MG tablet Take 1 tablet (10 mg total) by mouth at bedtime. 30 tablet 5  . risedronate (ACTONEL) 35 MG tablet TAKE 1 TAB BY MOUTH EVERY  7 DAYS WITH WATER ON EMPTY STOMACH, NOTHING BY MOUTH OR LIE DOWN FOR NEXT 30 MINUTES. 12 tablet 1  . VENTOLIN HFA 108 (90 Base) MCG/ACT inhaler Inhale 2 puffs into the lungs every 4 (four) hours as needed for wheezing or shortness of breath. 18 g 1  . Vitamin D, Ergocalciferol, (DRISDOL) 1.25 MG (50000 UT) CAPS capsule TAKE 1 CAPSULE (50,000 UNITS TOTAL) BY MOUTH EVERY 7 (SEVEN) DAYS. 12 capsule 1   No current facility-administered medications on file prior to visit.   She is allergic to contrast media [iodinated diagnostic  agents], isovue [iopamidol], amoxicillin, belviq xr [lorcaserin hcl er], saxenda [liraglutide -weight management], and penicillins..   Review of Systems Review of Systems  Constitutional: Negative for activity change, appetite change and fatigue.  HENT: Negative for hearing loss, congestion, tinnitus and ear discharge.  dentist q1m Eyes: Negative for visual disturbance (see optho q1y -- vision corrected to 20/20 with glasses).  Respiratory: Negative for cough, chest tightness and shortness of breath.   Cardiovascular: Negative for chest pain, palpitations and leg swelling.  Gastrointestinal: Negative for abdominal pain, diarrhea, constipation and abdominal distention.  Genitourinary: Negative for urgency, frequency, decreased urine volume and difficulty urinating.  Musculoskeletal: Negative for back pain, arthralgias and gait problem.  Skin: Negative for color change, pallor and rash.  Neurological: Negative for dizziness, light-headedness, numbness and headaches.  Hematological: Negative for adenopathy. Does not bruise/bleed easily.  Psychiatric/Behavioral: Negative for suicidal ideas, confusion, sleep disturbance, self-injury, dysphoric mood, decreased concentration and agitation.       Objective:    BP 122/76 (BP Location: Right Arm, Patient Position: Sitting, Cuff Size: Normal)   Pulse 66   Temp 98.2 F (36.8 C) (Oral)   Resp 18   Ht 5' 7.5" (1.715 m)   Wt 182 lb 6.4 oz (82.7 kg)   SpO2 97%   BMI 28.15 kg/m  General appearance: alert, cooperative, appears stated age and no distress Head: Normocephalic, without obvious abnormality, atraumatic Eyes: negative findings: lids and lashes normal, conjunctivae and sclerae normal and pupils equal, round, reactive to light and accomodation Ears: normal TM's and external ear canals both ears Neck: no adenopathy, no carotid bruit, no JVD, supple, symmetrical, trachea midline and thyroid not enlarged, symmetric, no  tenderness/mass/nodules Back: symmetric, no curvature. ROM normal. No CVA tenderness. Lungs: clear to auscultation bilaterally Breasts: gyn Heart: regular rate and rhythm, S1, S2 normal, no murmur, click, rub or gallop Abdomen: soft, non-tender; bowel sounds normal; no masses,  no organomegaly Pelvic: deferred--gyn Extremities: extremities normal, atraumatic, no cyanosis or edema Pulses: 2+ and symmetric Skin: Skin color, texture, turgor normal. No rashes or lesions Lymph nodes: Cervical, supraclavicular, and axillary nodes normal. Neurologic: Alert and oriented X 3, normal strength and tone. Normal symmetric reflexes. Normal coordination and gait    Assessment:    Healthy female exam.      Plan:  ghm utd Check labs    See After Visit Summary for Counseling Recommendations    1. Urinary frequency  - estradiol (VIVELLE-DOT) 0.025 MG/24HR; Place 1 patch onto the skin 2 (two) times a week.  Dispense: 24 patch; Refill: 5  2. Post-menopausal  - estradiol (VIVELLE-DOT) 0.025 MG/24HR; Place 1 patch onto the skin 2 (two) times a week.  Dispense: 24 patch; Refill: 5  3. Essential hypertension Well controlled, no changes to meds. Encouraged heart healthy diet such as the DASH diet and exercise as tolerated.   - olmesartan (BENICAR) 40 MG tablet; Take 1 tablet (40 mg  total) by mouth daily.  Dispense: 90 tablet; Refill: 1 - Lipid panel; Future - Comprehensive metabolic panel; Future - CBC with Differential/Platelet; Future  4. Hyperlipidemia, unspecified hyperlipidemia type Encouraged heart healthy diet, increase exercise, avoid trans fats, consider a krill oil cap daily - pravastatin (PRAVACHOL) 20 MG tablet; Take 1 tablet (20 mg total) by mouth daily.  Dispense: 90 tablet; Refill: 1 - Lipid panel; Future - Comprehensive metabolic panel; Future - CBC with Differential/Platelet; Future  5. Generalized anxiety disorder Stable con't meds  - ALPRAZolam (XANAX) 0.25 MG tablet; Take 1  tablet (0.25 mg total) by mouth at bedtime as needed for anxiety.  Dispense: 30 tablet; Refill: 1 - DRUG MONITORING, PANEL 8 WITH CONFIRMATION, URINE  6. High risk medication use   - DRUG MONITORING, PANEL 8 WITH CONFIRMATION, URINE  7. Vitamin D deficiency   - Vitamin D (25 hydroxy); Future  8. Preventative health care See above  - Vitamin D (25 hydroxy); Future - Lipid panel; Future - Comprehensive metabolic panel; Future - CBC with Differential/Platelet; Future  9. Breast pain Pt requesting mri -- she will pay out of pocket  - MR BREAST BILATERAL WO CONTRAST; Future  10. Hypothyroidism, unspecified type Check labs  con't meds  - TSH; Future  11. Overweight  - Insulin, random; Future  12. Need for influenza vaccination   - Flu Vaccine QUAD High Dose(Fluad)

## 2020-10-24 ENCOUNTER — Other Ambulatory Visit: Payer: BC Managed Care – PPO

## 2020-10-25 ENCOUNTER — Other Ambulatory Visit: Payer: Self-pay

## 2020-10-25 ENCOUNTER — Other Ambulatory Visit (INDEPENDENT_AMBULATORY_CARE_PROVIDER_SITE_OTHER): Payer: BC Managed Care – PPO

## 2020-10-25 DIAGNOSIS — E559 Vitamin D deficiency, unspecified: Secondary | ICD-10-CM | POA: Diagnosis not present

## 2020-10-25 DIAGNOSIS — E039 Hypothyroidism, unspecified: Secondary | ICD-10-CM | POA: Diagnosis not present

## 2020-10-25 DIAGNOSIS — I10 Essential (primary) hypertension: Secondary | ICD-10-CM | POA: Diagnosis not present

## 2020-10-25 DIAGNOSIS — Z0000 Encounter for general adult medical examination without abnormal findings: Secondary | ICD-10-CM | POA: Diagnosis not present

## 2020-10-25 DIAGNOSIS — E663 Overweight: Secondary | ICD-10-CM

## 2020-10-25 DIAGNOSIS — E785 Hyperlipidemia, unspecified: Secondary | ICD-10-CM

## 2020-10-25 LAB — DRUG MONITORING, PANEL 8 WITH CONFIRMATION, URINE
6 Acetylmorphine: NEGATIVE ng/mL (ref <10)
Alcohol Metabolites: NEGATIVE ng/mL
Alphahydroxyalprazolam: 53 ng/mL — ABNORMAL HIGH (ref <25)
Alphahydroxymidazolam: NEGATIVE ng/mL (ref <50)
Alphahydroxytriazolam: NEGATIVE ng/mL (ref <50)
Aminoclonazepam: NEGATIVE ng/mL (ref <25)
Amphetamines: NEGATIVE ng/mL (ref <500)
Benzodiazepines: POSITIVE ng/mL — AB (ref <100)
Buprenorphine, Urine: NEGATIVE ng/mL (ref <5)
Cocaine Metabolite: NEGATIVE ng/mL (ref <150)
Creatinine: >300 mg/dL
Hydroxyethylflurazepam: NEGATIVE ng/mL (ref <50)
Lorazepam: NEGATIVE ng/mL (ref <50)
MDMA: NEGATIVE ng/mL (ref <500)
Marijuana Metabolite: NEGATIVE ng/mL (ref <20)
Nordiazepam: NEGATIVE ng/mL (ref <50)
Opiates: NEGATIVE ng/mL (ref <100)
Oxazepam: NEGATIVE ng/mL (ref <50)
Oxidant: NEGATIVE ug/mL
Oxycodone: NEGATIVE ng/mL (ref <100)
Temazepam: NEGATIVE ng/mL (ref <50)
pH: 5.3 (ref 4.5–9.0)

## 2020-10-25 LAB — DM TEMPLATE

## 2020-10-26 LAB — VITAMIN D 25 HYDROXY (VIT D DEFICIENCY, FRACTURES): VITD: 37.57 ng/mL (ref 30.00–100.00)

## 2020-10-26 LAB — LIPID PANEL
Cholesterol: 180 mg/dL (ref <200)
HDL: 51 mg/dL (ref >=50)
LDL Cholesterol (Calc): 105 mg/dL (calc) — ABNORMAL HIGH
Non-HDL Cholesterol (Calc): 129 mg/dL (calc) (ref <130)
Total CHOL/HDL Ratio: 3.5 (calc) (ref <5.0)
Triglycerides: 138 mg/dL (ref <150)

## 2020-10-26 LAB — COMPREHENSIVE METABOLIC PANEL
AG Ratio: 1.7 (calc) (ref 1.0–2.5)
ALT: 18 U/L (ref 6–29)
AST: 19 U/L (ref 10–35)
Albumin: 4.3 g/dL (ref 3.6–5.1)
Alkaline phosphatase (APISO): 91 U/L (ref 37–153)
BUN: 14 mg/dL (ref 7–25)
CO2: 22 mmol/L (ref 20–32)
Calcium: 9.4 mg/dL (ref 8.6–10.4)
Chloride: 103 mmol/L (ref 98–110)
Creat: 0.84 mg/dL (ref 0.50–0.99)
Globulin: 2.6 g/dL (calc) (ref 1.9–3.7)
Glucose, Bld: 89 mg/dL (ref 65–99)
Potassium: 4.2 mmol/L (ref 3.5–5.3)
Sodium: 139 mmol/L (ref 135–146)
Total Bilirubin: 0.6 mg/dL (ref 0.2–1.2)
Total Protein: 6.9 g/dL (ref 6.1–8.1)

## 2020-10-26 LAB — INSULIN, RANDOM: Insulin: 13.6 u[IU]/mL

## 2020-10-26 LAB — CBC WITH DIFFERENTIAL/PLATELET
Absolute Monocytes: 468 cells/uL (ref 200–950)
Basophils Absolute: 50 cells/uL (ref 0–200)
Basophils Relative: 0.7 %
Eosinophils Absolute: 209 cells/uL (ref 15–500)
Eosinophils Relative: 2.9 %
HCT: 46.5 % — ABNORMAL HIGH (ref 35.0–45.0)
Hemoglobin: 15.5 g/dL (ref 11.7–15.5)
Lymphs Abs: 2902 cells/uL (ref 850–3900)
MCH: 30.3 pg (ref 27.0–33.0)
MCHC: 33.3 g/dL (ref 32.0–36.0)
MCV: 90.8 fL (ref 80.0–100.0)
MPV: 9.9 fL (ref 7.5–12.5)
Monocytes Relative: 6.5 %
Neutro Abs: 3571 cells/uL (ref 1500–7800)
Neutrophils Relative %: 49.6 %
Platelets: 242 10*3/uL (ref 140–400)
RBC: 5.12 10*6/uL — ABNORMAL HIGH (ref 3.80–5.10)
RDW: 12.5 % (ref 11.0–15.0)
Total Lymphocyte: 40.3 %
WBC: 7.2 10*3/uL (ref 3.8–10.8)

## 2020-10-26 LAB — EXTRA SPECIMEN

## 2020-10-26 LAB — TSH: TSH: 1.65 mIU/L (ref 0.40–4.50)

## 2020-11-01 ENCOUNTER — Other Ambulatory Visit: Payer: Self-pay | Admitting: Family Medicine

## 2020-11-01 DIAGNOSIS — E039 Hypothyroidism, unspecified: Secondary | ICD-10-CM

## 2020-11-01 DIAGNOSIS — E785 Hyperlipidemia, unspecified: Secondary | ICD-10-CM

## 2020-11-01 DIAGNOSIS — I10 Essential (primary) hypertension: Secondary | ICD-10-CM

## 2020-11-23 DIAGNOSIS — J0121 Acute recurrent ethmoidal sinusitis: Secondary | ICD-10-CM | POA: Diagnosis not present

## 2020-11-29 DIAGNOSIS — J0121 Acute recurrent ethmoidal sinusitis: Secondary | ICD-10-CM | POA: Insufficient documentation

## 2020-11-29 HISTORY — DX: Acute recurrent ethmoidal sinusitis: J01.21

## 2020-12-28 DIAGNOSIS — L728 Other follicular cysts of the skin and subcutaneous tissue: Secondary | ICD-10-CM | POA: Diagnosis not present

## 2020-12-28 DIAGNOSIS — C44311 Basal cell carcinoma of skin of nose: Secondary | ICD-10-CM | POA: Diagnosis not present

## 2021-01-16 DIAGNOSIS — D485 Neoplasm of uncertain behavior of skin: Secondary | ICD-10-CM | POA: Diagnosis not present

## 2021-03-05 DIAGNOSIS — N644 Mastodynia: Secondary | ICD-10-CM | POA: Diagnosis not present

## 2021-03-05 LAB — HM MAMMOGRAPHY

## 2021-03-06 ENCOUNTER — Ambulatory Visit (INDEPENDENT_AMBULATORY_CARE_PROVIDER_SITE_OTHER): Payer: BC Managed Care – PPO | Admitting: Plastic Surgery

## 2021-03-06 ENCOUNTER — Other Ambulatory Visit: Payer: Self-pay

## 2021-03-06 ENCOUNTER — Encounter: Payer: Self-pay | Admitting: Plastic Surgery

## 2021-03-06 VITALS — BP 135/84 | HR 81 | Ht 67.0 in | Wt 185.0 lb

## 2021-03-06 DIAGNOSIS — Z719 Counseling, unspecified: Secondary | ICD-10-CM

## 2021-03-06 NOTE — Progress Notes (Signed)
Patient ID: Kathryn Boyer, female    DOB: 1956/01/09, 65 y.o.   MRN: [id number]   Chief Complaint  Patient presents with   Advice Only    The patient is a 65 year old female here for evaluation of her nose.  She is also interested in facial rejuvenation and possibly a facelift.  She had a rhinoplasty by Dr. Norberta Keens for this approximately 30 years ago.  She did not like the way her nose looked and describe it as a thickeners.  She then had a implant placed by Dr. Shon Hough several years ago.  Over the past year she has noticed some irregularity of the nose and a bump.  She has had it evaluated by 2-3 dermatologists who have not agreed on whether or not it looks worrisome.  She is afraid that if she has it excised and will expose the implant.  She is likely correct about this.  She has some gelling and loss of midface volume.  She has had some filler in the past but its been a while and she did not notice much change.  She has some hyperpigmentation and wrinkling.  Overall she looks very good for her age.  She is 5 feet 7 inches tall and weighs 185 pounds.  Past medical history and surgeries noted below.   Review of Systems  Constitutional: Negative.   HENT: Negative.    Eyes: Negative.   Respiratory: Negative.  Negative for chest tightness.   Cardiovascular: Negative.   Gastrointestinal: Negative.   Endocrine: Negative.   Genitourinary: Negative.   Hematological: Negative.   Psychiatric/Behavioral: Negative.     Past Medical History:  Diagnosis Date   Anxiety    Asthma    Hyperlipidemia    Hypertension    Plantar fasciitis    Thyroid disease     Past Surgical History:  Procedure Laterality Date   ABDOMINAL HYSTERECTOMY     BREAST SURGERY     NOSE SURGERY        Current Outpatient Medications:    cyclobenzaprine (FLEXERIL) 5 MG tablet, Take 1 tablet (5 mg total) by mouth 3 (three) times daily as needed for muscle spasms., Disp: 30 tablet, Rfl: 1   estradiol (ESTRACE) 0.1  MG/GM vaginal cream, Place 1 Applicatorful vaginally 3 (three) times a week., Disp: 42.5 g, Rfl: 3   estradiol (VIVELLE-DOT) 0.025 MG/24HR, Place 1 patch onto the skin 2 (two) times a week., Disp: 24 patch, Rfl: 5   flunisolide (NASALIDE) 25 MCG/ACT (0.025%) SOLN, Place 2 sprays into the nose 2 (two) times daily., Disp: 1 Bottle, Rfl: 5   fluticasone (FLOVENT HFA) 110 MCG/ACT inhaler, Inhale 2 puffs into the lungs 2 (two) times daily., Disp: 1 Inhaler, Rfl: 5   levothyroxine (SYNTHROID) 100 MCG tablet, TAKE 1 TABLET BY MOUTH EVERY DAY, Disp: 90 tablet, Rfl: 1   montelukast (SINGULAIR) 10 MG tablet, Take 1 tablet (10 mg total) by mouth at bedtime., Disp: 30 tablet, Rfl: 5   olmesartan (BENICAR) 40 MG tablet, Take 1 tablet by mouth daily., Disp: , Rfl:    pravastatin (PRAVACHOL) 20 MG tablet, TAKE 1 TABLET BY MOUTH EVERY DAY, Disp: 90 tablet, Rfl: 1   VENTOLIN HFA 108 (90 Base) MCG/ACT inhaler, Inhale 2 puffs into the lungs every 4 (four) hours as needed for wheezing or shortness of breath., Disp: 18 g, Rfl: 1   ALPRAZolam (XANAX) 0.25 MG tablet, Take 0.25 mg by mouth daily as needed., Disp: , Rfl:    famotidine (  PEPCID) 20 MG tablet, TAKE 1 TABLET BY MOUTH TWICE DAILY (Patient not taking: Reported on 03/06/2021), Disp: 180 tablet, Rfl: 1   risedronate (ACTONEL) 35 MG tablet, TAKE 1 TAB BY MOUTH EVERY 7 DAYS WITH WATER ON EMPTY STOMACH, NOTHING BY MOUTH OR LIE DOWN FOR NEXT 30 MINUTES. (Patient not taking: Reported on 03/06/2021), Disp: 12 tablet, Rfl: 1   Vitamin D, Ergocalciferol, (DRISDOL) 1.25 MG (50000 UT) CAPS capsule, TAKE 1 CAPSULE (50,000 UNITS TOTAL) BY MOUTH EVERY 7 (SEVEN) DAYS. (Patient not taking: Reported on 03/06/2021), Disp: 12 capsule, Rfl: 1   Objective:   Vitals:   03/06/21 1119  BP: 135/84  Pulse: 81  SpO2: 97%    Physical Exam Vitals reviewed.  Constitutional:      Appearance: Normal appearance.  HENT:     Head: Normocephalic.  Cardiovascular:     Rate and Rhythm:  Normal rate.  Pulmonary:     Effort: Pulmonary effort is normal.  Musculoskeletal:        General: No swelling, tenderness or signs of injury.  Skin:    General: Skin is warm.     Capillary Refill: Capillary refill takes less than 2 seconds.     Coloration: Skin is not jaundiced.     Findings: No bruising.  Neurological:     General: No focal deficit present.     Mental Status: She is alert and oriented to person, place, and time.    Assessment & Plan:  Encounter for counseling  We will provide the patient with a quote for Botox, filler and laser.  She is going to think about what to do about her nose.  By recommendation is for removal of the implant and see what she has to work with.  Due to the timeframe that has elapsed since the placement of it she may have less of an angle than she originally had.  Otherwise she will need cartilage grafting.  This could be done from her ear or her hips.  Pictures were obtained of the patient and placed in the chart with the patient's or guardian's permission.   Alena Bills Munachimso Rigdon, DO

## 2021-03-07 NOTE — Progress Notes (Signed)
a 

## 2021-04-19 ENCOUNTER — Ambulatory Visit (INDEPENDENT_AMBULATORY_CARE_PROVIDER_SITE_OTHER): Payer: BC Managed Care – PPO | Admitting: Family Medicine

## 2021-04-19 ENCOUNTER — Other Ambulatory Visit: Payer: Self-pay

## 2021-04-19 ENCOUNTER — Encounter: Payer: Self-pay | Admitting: Family Medicine

## 2021-04-19 VITALS — BP 118/74 | HR 75 | Temp 98.4°F | Resp 18 | Ht 67.0 in | Wt 185.8 lb

## 2021-04-19 DIAGNOSIS — I10 Essential (primary) hypertension: Secondary | ICD-10-CM | POA: Diagnosis not present

## 2021-04-19 DIAGNOSIS — E039 Hypothyroidism, unspecified: Secondary | ICD-10-CM | POA: Diagnosis not present

## 2021-04-19 DIAGNOSIS — F419 Anxiety disorder, unspecified: Secondary | ICD-10-CM

## 2021-04-19 DIAGNOSIS — E785 Hyperlipidemia, unspecified: Secondary | ICD-10-CM

## 2021-04-19 DIAGNOSIS — F411 Generalized anxiety disorder: Secondary | ICD-10-CM

## 2021-04-19 LAB — LIPID PANEL
Cholesterol: 184 mg/dL (ref 0–200)
HDL: 52.3 mg/dL (ref >39.00)
LDL Cholesterol: 109 mg/dL — ABNORMAL HIGH (ref 0–99)
NonHDL: 131.29
Total CHOL/HDL Ratio: 4
Triglycerides: 109 mg/dL (ref 0.0–149.0)
VLDL: 21.8 mg/dL (ref 0.0–40.0)

## 2021-04-19 LAB — COMPREHENSIVE METABOLIC PANEL
ALT: 27 U/L (ref 0–35)
AST: 26 U/L (ref 0–37)
Albumin: 4.5 g/dL (ref 3.5–5.2)
Alkaline Phosphatase: 86 U/L (ref 39–117)
BUN: 15 mg/dL (ref 6–23)
CO2: 27 mEq/L (ref 19–32)
Calcium: 9.7 mg/dL (ref 8.4–10.5)
Chloride: 103 mEq/L (ref 96–112)
Creatinine, Ser: 0.98 mg/dL (ref 0.40–1.20)
GFR: 60.36 mL/min (ref >60.00)
Glucose, Bld: 91 mg/dL (ref 70–99)
Potassium: 4.7 mEq/L (ref 3.5–5.1)
Sodium: 139 mEq/L (ref 135–145)
Total Bilirubin: 0.8 mg/dL (ref 0.2–1.2)
Total Protein: 7.2 g/dL (ref 6.0–8.3)

## 2021-04-19 LAB — TSH: TSH: 1.88 u[IU]/mL (ref 0.35–5.50)

## 2021-04-19 MED ORDER — ALPRAZOLAM 0.25 MG PO TABS: 0.2500 mg | ORAL_TABLET | Freq: Every day | ORAL | 1 refills | Status: DC | PRN

## 2021-04-19 NOTE — Progress Notes (Signed)
Subjective:   By signing my name below, I, Kathryn Boyer, attest that this documentation has been prepared under the direction and in the presence of Dr. Seabron Spates, DO. 04/19/2021    Patient ID: Kathryn Boyer, female    DOB: March 05, 1956, 65 y.o.   MRN: [id number]  Chief Complaint  Patient presents with   Hypertension   Follow-up    HPI Patient is in today for a office visit. She is requesting a refill for 0.25 mg xanax daily PRN.  Her blood pressure is doing good during this visit. She continues taking 40 mg benicar daily PO and reports no new issues while taking it. She also needs her tsh checked.    BP Readings from Last 3 Encounters:  04/19/21 118/74  03/06/21 135/84  10/20/20 122/76   She is struggling with weight loss at this time. She has tried seeing a healthy weight and wellness program but could not set up an appointment.   Wt Readings from Last 3 Encounters:  04/19/21 185 lb 12.8 oz (84.3 kg)  03/06/21 185 lb (83.9 kg)  10/20/20 182 lb 6.4 oz (82.7 kg)   She is eligible from the pneumonia vaccine and is willing to get it at a later date.   Past Medical History:  Diagnosis Date   Anxiety    Asthma    Hyperlipidemia    Hypertension    Plantar fasciitis    Thyroid disease     Past Surgical History:  Procedure Laterality Date   ABDOMINAL HYSTERECTOMY     BREAST SURGERY     NOSE SURGERY      Family History  Problem Relation Age of Onset   Heart disease Mother    Stroke Mother    Hypertension Mother    Cancer Mother        ovarian   Aneurysm Father    Hypertension Father    Heart disease Father    Kidney disease Father    Heart disease Sister     Social History   Socioeconomic History   Marital status: Married    Spouse name: Not on file   Number of children: Not on file   Years of education: Not on file   Highest education level: Not on file  Occupational History   Not on file  Tobacco Use   Smoking status: Never   Smokeless  tobacco: Never  Vaping Use   Vaping Use: Never used  Substance and Sexual Activity   Alcohol use: No   Drug use: No   Sexual activity: Not on file  Other Topics Concern   Not on file  Social History Narrative   Exercise-sometimes   Social Determinants of Health   Financial Resource Strain: Not on file  Food Insecurity: Not on file  Transportation Needs: Not on file  Physical Activity: Not on file  Stress: Not on file  Social Connections: Not on file  Intimate Partner Violence: Not on file    Outpatient Medications Prior to Visit  Medication Sig Dispense Refill   cyclobenzaprine (FLEXERIL) 5 MG tablet Take 1 tablet (5 mg total) by mouth 3 (three) times daily as needed for muscle spasms. 30 tablet 1   estradiol (ESTRACE) 0.1 MG/GM vaginal cream Place 1 Applicatorful vaginally 3 (three) times a week. 42.5 g 3   estradiol (VIVELLE-DOT) 0.025 MG/24HR Place 1 patch onto the skin 2 (two) times a week. 24 patch 5   famotidine (PEPCID) 20 MG tablet TAKE 1 TABLET BY MOUTH  TWICE DAILY 180 tablet 1   flunisolide (NASALIDE) 25 MCG/ACT (0.025%) SOLN Place 2 sprays into the nose 2 (two) times daily. 1 Bottle 5   fluticasone (FLOVENT HFA) 110 MCG/ACT inhaler Inhale 2 puffs into the lungs 2 (two) times daily. 1 Inhaler 5   levothyroxine (SYNTHROID) 100 MCG tablet TAKE 1 TABLET BY MOUTH EVERY DAY 90 tablet 1   montelukast (SINGULAIR) 10 MG tablet Take 1 tablet (10 mg total) by mouth at bedtime. 30 tablet 5   olmesartan (BENICAR) 40 MG tablet Take 1 tablet by mouth daily.     pravastatin (PRAVACHOL) 20 MG tablet TAKE 1 TABLET BY MOUTH EVERY DAY 90 tablet 1   VENTOLIN HFA 108 (90 Base) MCG/ACT inhaler Inhale 2 puffs into the lungs every 4 (four) hours as needed for wheezing or shortness of breath. 18 g 1   ALPRAZolam (XANAX) 0.25 MG tablet Take 0.25 mg by mouth daily as needed.     risedronate (ACTONEL) 35 MG tablet TAKE 1 TAB BY MOUTH EVERY 7 DAYS WITH WATER ON EMPTY STOMACH, NOTHING BY MOUTH OR  LIE DOWN FOR NEXT 30 MINUTES. (Patient not taking: No sig reported) 12 tablet 1   Vitamin D, Ergocalciferol, (DRISDOL) 1.25 MG (50000 UT) CAPS capsule TAKE 1 CAPSULE (50,000 UNITS TOTAL) BY MOUTH EVERY 7 (SEVEN) DAYS. (Patient not taking: No sig reported) 12 capsule 1   No facility-administered medications prior to visit.    Allergies  Allergen Reactions   Contrast Media [Iodinated Diagnostic Agents] Hives   Isovue [Iopamidol] Hives    After returning home pt called to inform technologist of rash to face and neck, denies difficulty breathing or swallowing, pt took 2 benedryl Will need premeds in future   Amoxicillin Other (See Comments)    Headache    Belviq Xr [Lorcaserin Hcl Er] Itching    Caused her face to turn red.   Saxenda [Liraglutide -Weight Management] Other (See Comments)    Caused indigestion.   Penicillins Rash    Review of Systems  Constitutional:  Negative for fever and malaise/fatigue.  HENT:  Negative for congestion.   Eyes:  Negative for blurred vision.  Respiratory:  Negative for shortness of breath.   Cardiovascular:  Negative for chest pain, palpitations and leg swelling.  Gastrointestinal:  Negative for abdominal pain, blood in stool and nausea.  Genitourinary:  Negative for dysuria and frequency.  Musculoskeletal:  Negative for falls.  Skin:  Negative for rash.  Neurological:  Negative for dizziness, loss of consciousness and headaches.  Endo/Heme/Allergies:  Negative for environmental allergies.  Psychiatric/Behavioral:  Negative for depression. The patient is not nervous/anxious.       Objective:    Physical Exam Vitals and nursing note reviewed.  Constitutional:      General: She is not in acute distress.    Appearance: Normal appearance. She is not ill-appearing.  HENT:     Head: Normocephalic and atraumatic.     Right Ear: External ear normal.     Left Ear: External ear normal.  Eyes:     Extraocular Movements: Extraocular movements intact.      Pupils: Pupils are equal, round, and reactive to light.  Cardiovascular:     Rate and Rhythm: Normal rate and regular rhythm.     Heart sounds: Normal heart sounds. No murmur heard.   No gallop.  Pulmonary:     Effort: Pulmonary effort is normal. No respiratory distress.     Breath sounds: Normal breath sounds. No wheezing or  rales.  Skin:    General: Skin is warm and dry.  Neurological:     Mental Status: She is alert and oriented to person, place, and time.  Psychiatric:        Behavior: Behavior normal.        Judgment: Judgment normal.    BP 118/74 (BP Location: Right Arm, Patient Position: Sitting, Cuff Size: Normal)   Pulse 75   Temp 98.4 F (36.9 C) (Oral)   Resp 18   Ht 5\' 7"  (1.702 m)   Wt 185 lb 12.8 oz (84.3 kg)   SpO2 98%   BMI 29.10 kg/m  Wt Readings from Last 3 Encounters:  04/19/21 185 lb 12.8 oz (84.3 kg)  03/06/21 185 lb (83.9 kg)  10/20/20 182 lb 6.4 oz (82.7 kg)    Diabetic Foot Exam - Simple   No data filed    Lab Results  Component Value Date   WBC 7.2 10/25/2020   HGB 15.5 10/25/2020   HCT 46.5 (H) 10/25/2020   PLT 242 10/25/2020   GLUCOSE 89 10/25/2020   CHOL 180 10/25/2020   TRIG 138 10/25/2020   HDL 51 10/25/2020   LDLCALC 105 (H) 10/25/2020   ALT 18 10/25/2020   AST 19 10/25/2020   NA 139 10/25/2020   K 4.2 10/25/2020   CL 103 10/25/2020   CREATININE 0.84 10/25/2020   BUN 14 10/25/2020   CO2 22 10/25/2020   TSH 1.65 10/25/2020    Lab Results  Component Value Date   TSH 1.65 10/25/2020   Lab Results  Component Value Date   WBC 7.2 10/25/2020   HGB 15.5 10/25/2020   HCT 46.5 (H) 10/25/2020   MCV 90.8 10/25/2020   PLT 242 10/25/2020   Lab Results  Component Value Date   NA 139 10/25/2020   K 4.2 10/25/2020   CO2 22 10/25/2020   GLUCOSE 89 10/25/2020   BUN 14 10/25/2020   CREATININE 0.84 10/25/2020   BILITOT 0.6 10/25/2020   ALKPHOS 84 02/24/2020   AST 19 10/25/2020   ALT 18 10/25/2020   PROT 6.9 10/25/2020    ALBUMIN 4.7 02/24/2020   CALCIUM 9.4 10/25/2020   GFR 60.53 02/24/2020   Lab Results  Component Value Date   CHOL 180 10/25/2020   Lab Results  Component Value Date   HDL 51 10/25/2020   Lab Results  Component Value Date   LDLCALC 105 (H) 10/25/2020   Lab Results  Component Value Date   TRIG 138 10/25/2020   Lab Results  Component Value Date   CHOLHDL 3.5 10/25/2020   No results found for: HGBA1C     Assessment & Plan:   Problem List Items Addressed This Visit       Unprioritized   Essential hypertension    Well controlled, no changes to meds. Encouraged heart healthy diet such as the DASH diet and exercise as tolerated.       Generalized anxiety disorder    Stable con't xanax prn      Relevant Medications   ALPRAZolam (XANAX) 0.25 MG tablet   Hyperlipidemia LDL goal <100    Encourage heart healthy diet such as MIND or DASH diet, increase exercise, avoid trans fats, simple carbohydrates and processed foods, consider a krill or fish or flaxseed oil cap daily.       Hypothyroidism    Check labs       Relevant Orders   TSH   Other Visit Diagnoses  Anxiety    -  Primary   Relevant Medications   ALPRAZolam (XANAX) 0.25 MG tablet   Primary hypertension       Hyperlipidemia, unspecified hyperlipidemia type       Relevant Orders   Lipid panel   Comprehensive metabolic panel        Meds ordered this encounter  Medications   ALPRAZolam (XANAX) 0.25 MG tablet    Sig: Take 1 tablet (0.25 mg total) by mouth daily as needed.    Dispense:  30 tablet    Refill:  1    I, Dr. Seabron SpatesLowne-Chase, Krishna Dancel, DO, personally preformed the services described in this documentation.  All medical record entries made by the scribe were at my direction and in my presence.  I have reviewed the chart and discharge instructions (if applicable) and agree that the record reflects my personal performance and is accurate and complete. 04/19/2021   I,Kathryn Boyer,acting as a  scribe for Donato SchultzYvonne R Lowne Chase, DO.,have documented all relevant documentation on the behalf of Donato SchultzYvonne R Lowne Chase, DO,as directed by  Donato SchultzYvonne R Lowne Chase, DO while in the presence of Donato SchultzYvonne R Lowne Chase, DO.   Donato SchultzYvonne R Lowne Chase, DO

## 2021-04-19 NOTE — Patient Instructions (Signed)
https://www.nhlbi.nih.gov/files/docs/public/heart/dash_brief.pdf">  DASH Eating Plan DASH stands for Dietary Approaches to Stop Hypertension. The DASH eating plan is a healthy eating plan that has been shown to: Reduce high blood pressure (hypertension). Reduce your risk for type 2 diabetes, heart disease, and stroke. Help with weight loss. What are tips for following this plan? Reading food labels Check food labels for the amount of salt (sodium) per serving. Choose foods with less than 5 percent of the Daily Value of sodium. Generally, foods with less than 300 milligrams (mg) of sodium per serving fit into this eating plan. To find whole grains, look for the word "whole" as the first word in the ingredient list. Shopping Buy products labeled as "low-sodium" or "no salt added." Buy fresh foods. Avoid canned foods and pre-made or frozen meals. Cooking Avoid adding salt when cooking. Use salt-free seasonings or herbs instead of table salt or sea salt. Check with your health care provider or pharmacist before using salt substitutes. Do not fry foods. Cook foods using healthy methods such as baking, boiling, grilling, roasting, and broiling instead. Cook with heart-healthy oils, such as olive, canola, avocado, soybean, or sunflower oil. Meal planning  Eat a balanced diet that includes: 4 or more servings of fruits and 4 or more servings of vegetables each day. Try to fill one-half of your plate with fruits and vegetables. 6-8 servings of whole grains each day. Less than 6 oz (170 g) of lean meat, poultry, or fish each day. A 3-oz (85-g) serving of meat is about the same size as a deck of cards. One egg equals 1 oz (28 g). 2-3 servings of low-fat dairy each day. One serving is 1 cup (237 mL). 1 serving of nuts, seeds, or beans 5 times each week. 2-3 servings of heart-healthy fats. Healthy fats called omega-3 fatty acids are found in foods such as walnuts, flaxseeds, fortified milks, and eggs.  These fats are also found in cold-water fish, such as sardines, salmon, and mackerel. Limit how much you eat of: Canned or prepackaged foods. Food that is high in trans fat, such as some fried foods. Food that is high in saturated fat, such as fatty meat. Desserts and other sweets, sugary drinks, and other foods with added sugar. Full-fat dairy products. Do not salt foods before eating. Do not eat more than 4 egg yolks a week. Try to eat at least 2 vegetarian meals a week. Eat more home-cooked food and less restaurant, buffet, and fast food.  Lifestyle When eating at a restaurant, ask that your food be prepared with less salt or no salt, if possible. If you drink alcohol: Limit how much you use to: 0-1 drink a day for women who are not pregnant. 0-2 drinks a day for men. Be aware of how much alcohol is in your drink. In the [place] one drink equals one 12 oz bottle of beer (355 mL), one 5 oz glass of wine (148 mL), or one 1 oz glass of hard liquor (44 mL). General information Avoid eating more than 2,300 mg of salt a day. If you have hypertension, you may need to reduce your sodium intake to 1,500 mg a day. Work with your health care provider to maintain a healthy body weight or to lose weight. Ask what an ideal weight is for you. Get at least 30 minutes of exercise that causes your heart to beat faster (aerobic exercise) most days of the week. Activities may include walking, swimming, or biking. Work with your health care provider   or dietitian to adjust your eating plan to your individual calorie needs. What foods should I eat? Fruits All fresh, dried, or frozen fruit. Canned fruit in natural juice (without addedsugar). Vegetables Fresh or frozen vegetables (raw, steamed, roasted, or grilled). Low-sodium or reduced-sodium tomato and vegetable juice. Low-sodium or reduced-sodium tomatosauce and tomato paste. Low-sodium or reduced-sodium canned vegetables. Grains Whole-grain or  whole-wheat bread. Whole-grain or whole-wheat pasta. Brown rice. Oatmeal. Quinoa. Bulgur. Whole-grain and low-sodium cereals. Pita bread.Low-fat, low-sodium crackers. Whole-wheat flour tortillas. Meats and other proteins Skinless chicken or turkey. Ground chicken or turkey. Pork with fat trimmed off. Fish and seafood. Egg whites. Dried beans, peas, or lentils. Unsalted nuts, nut butters, and seeds. Unsalted canned beans. Lean cuts of beef with fat trimmed off. Low-sodium, lean precooked or cured meat, such as sausages or meatloaves. Dairy Low-fat (1%) or fat-free (skim) milk. Reduced-fat, low-fat, or fat-free cheeses. Nonfat, low-sodium ricotta or cottage cheese. Low-fat or nonfatyogurt. Low-fat, low-sodium cheese. Fats and oils Soft margarine without trans fats. Vegetable oil. Reduced-fat, low-fat, or light mayonnaise and salad dressings (reduced-sodium). Canola, safflower, olive, avocado, soybean, andsunflower oils. Avocado. Seasonings and condiments Herbs. Spices. Seasoning mixes without salt. Other foods Unsalted popcorn and pretzels. Fat-free sweets. The items listed above may not be a complete list of foods and beverages you can eat. Contact a dietitian for more information. What foods should I avoid? Fruits Canned fruit in a light or heavy syrup. Fried fruit. Fruit in cream or buttersauce. Vegetables Creamed or fried vegetables. Vegetables in a cheese sauce. Regular canned vegetables (not low-sodium or reduced-sodium). Regular canned tomato sauce and paste (not low-sodium or reduced-sodium). Regular tomato and vegetable juice(not low-sodium or reduced-sodium). Pickles. Olives. Grains Baked goods made with fat, such as croissants, muffins, or some breads. Drypasta or rice meal packs. Meats and other proteins Fatty cuts of meat. Ribs. Fried meat. Bacon. Bologna, salami, and other precooked or cured meats, such as sausages or meat loaves. Fat from the back of a pig (fatback). Bratwurst.  Salted nuts and seeds. Canned beans with added salt. Canned orsmoked fish. Whole eggs or egg yolks. Chicken or turkey with skin. Dairy Whole or 2% milk, cream, and half-and-half. Whole or full-fat cream cheese. Whole-fat or sweetened yogurt. Full-fat cheese. Nondairy creamers. Whippedtoppings. Processed cheese and cheese spreads. Fats and oils Butter. Stick margarine. Lard. Shortening. Ghee. Bacon fat. Tropical oils, suchas coconut, palm kernel, or palm oil. Seasonings and condiments Onion salt, garlic salt, seasoned salt, table salt, and sea salt. Worcestershire sauce. Tartar sauce. Barbecue sauce. Teriyaki sauce. Soy sauce, including reduced-sodium. Steak sauce. Canned and packaged gravies. Fish sauce. Oyster sauce. Cocktail sauce. Store-bought horseradish. Ketchup. Mustard. Meat flavorings and tenderizers. Bouillon cubes. Hot sauces. Pre-made or packaged marinades. Pre-made or packaged taco seasonings. Relishes. Regular saladdressings. Other foods Salted popcorn and pretzels. The items listed above may not be a complete list of foods and beverages you should avoid. Contact a dietitian for more information. Where to find more information National Heart, Lung, and Blood Institute: www.nhlbi.nih.gov American Heart Association: www.heart.org Academy of Nutrition and Dietetics: www.eatright.org National Kidney Foundation: www.kidney.org Summary The DASH eating plan is a healthy eating plan that has been shown to reduce high blood pressure (hypertension). It may also reduce your risk for type 2 diabetes, heart disease, and stroke. When on the DASH eating plan, aim to eat more fresh fruits and vegetables, whole grains, lean proteins, low-fat dairy, and heart-healthy fats. With the DASH eating plan, you should limit salt (sodium) intake to 2,300   mg a day. If you have hypertension, you may need to reduce your sodium intake to 1,500 mg a day. Work with your health care provider or dietitian to adjust  your eating plan to your individual calorie needs. This information is not intended to replace advice given to you by your health care provider. Make sure you discuss any questions you have with your healthcare provider. Document Revised: 07/16/2019 Document Reviewed: 07/16/2019 Elsevier Patient Education  2022 Elsevier Inc.  

## 2021-04-19 NOTE — Assessment & Plan Note (Signed)
Well controlled, no changes to meds. Encouraged heart healthy diet such as the DASH diet and exercise as tolerated.  °

## 2021-04-19 NOTE — Assessment & Plan Note (Signed)
Encourage heart healthy diet such as MIND or DASH diet, increase exercise, avoid trans fats, simple carbohydrates and processed foods, consider a krill or fish or flaxseed oil cap daily.  °

## 2021-04-19 NOTE — Assessment & Plan Note (Signed)
Check labs 

## 2021-04-19 NOTE — Assessment & Plan Note (Signed)
Stable con't xanax prn 

## 2021-04-21 ENCOUNTER — Other Ambulatory Visit: Payer: Self-pay | Admitting: Family Medicine

## 2021-04-21 DIAGNOSIS — E039 Hypothyroidism, unspecified: Secondary | ICD-10-CM

## 2021-05-07 ENCOUNTER — Other Ambulatory Visit: Payer: Self-pay | Admitting: Family Medicine

## 2021-05-07 DIAGNOSIS — E785 Hyperlipidemia, unspecified: Secondary | ICD-10-CM

## 2021-05-10 ENCOUNTER — Other Ambulatory Visit: Payer: Self-pay | Admitting: Family Medicine

## 2021-05-15 DIAGNOSIS — R309 Painful micturition, unspecified: Secondary | ICD-10-CM | POA: Diagnosis not present

## 2021-05-15 DIAGNOSIS — N952 Postmenopausal atrophic vaginitis: Secondary | ICD-10-CM | POA: Diagnosis not present

## 2021-05-16 DIAGNOSIS — R319 Hematuria, unspecified: Secondary | ICD-10-CM | POA: Diagnosis not present

## 2021-06-21 ENCOUNTER — Encounter (HOSPITAL_BASED_OUTPATIENT_CLINIC_OR_DEPARTMENT_OTHER): Payer: Self-pay | Admitting: *Deleted

## 2021-06-21 ENCOUNTER — Emergency Department (HOSPITAL_BASED_OUTPATIENT_CLINIC_OR_DEPARTMENT_OTHER): Payer: BC Managed Care – PPO

## 2021-06-21 ENCOUNTER — Other Ambulatory Visit: Payer: Self-pay

## 2021-06-21 ENCOUNTER — Telehealth: Payer: Self-pay | Admitting: Family Medicine

## 2021-06-21 ENCOUNTER — Emergency Department (HOSPITAL_BASED_OUTPATIENT_CLINIC_OR_DEPARTMENT_OTHER)
Admission: EM | Admit: 2021-06-21 | Discharge: 2021-06-21 | Disposition: A | Discharge status: Home / Self Care | Payer: BC Managed Care – PPO | Attending: Emergency Medicine | Admitting: Emergency Medicine

## 2021-06-21 DIAGNOSIS — R0789 Other chest pain: Secondary | ICD-10-CM | POA: Insufficient documentation

## 2021-06-21 DIAGNOSIS — K219 Gastro-esophageal reflux disease without esophagitis: Secondary | ICD-10-CM | POA: Diagnosis not present

## 2021-06-21 DIAGNOSIS — Z7952 Long term (current) use of systemic steroids: Secondary | ICD-10-CM | POA: Diagnosis not present

## 2021-06-21 DIAGNOSIS — J45909 Unspecified asthma, uncomplicated: Secondary | ICD-10-CM | POA: Diagnosis not present

## 2021-06-21 DIAGNOSIS — E039 Hypothyroidism, unspecified: Secondary | ICD-10-CM | POA: Diagnosis not present

## 2021-06-21 DIAGNOSIS — R079 Chest pain, unspecified: Secondary | ICD-10-CM | POA: Diagnosis not present

## 2021-06-21 DIAGNOSIS — I10 Essential (primary) hypertension: Secondary | ICD-10-CM | POA: Diagnosis not present

## 2021-06-21 DIAGNOSIS — Z79899 Other long term (current) drug therapy: Secondary | ICD-10-CM | POA: Diagnosis not present

## 2021-06-21 LAB — CBC
HCT: 45.6 % (ref 36.0–46.0)
Hemoglobin: 15.4 g/dL — ABNORMAL HIGH (ref 12.0–15.0)
MCH: 30.7 pg (ref 26.0–34.0)
MCHC: 33.8 g/dL (ref 30.0–36.0)
MCV: 90.8 fL (ref 80.0–100.0)
Platelets: 260 10*3/uL (ref 150–400)
RBC: 5.02 MIL/uL (ref 3.87–5.11)
RDW: 12.6 % (ref 11.5–15.5)
WBC: 8.1 10*3/uL (ref 4.0–10.5)
nRBC: 0 % (ref 0.0–0.2)

## 2021-06-21 LAB — BASIC METABOLIC PANEL
Anion gap: 10 (ref 5–15)
BUN: 11 mg/dL (ref 8–23)
CO2: 24 mmol/L (ref 22–32)
Calcium: 9.6 mg/dL (ref 8.9–10.3)
Chloride: 105 mmol/L (ref 98–111)
Creatinine, Ser: 1.04 mg/dL — ABNORMAL HIGH (ref 0.44–1.00)
GFR, Estimated: 59 mL/min — ABNORMAL LOW (ref >60)
Glucose, Bld: 107 mg/dL — ABNORMAL HIGH (ref 70–99)
Potassium: 3.7 mmol/L (ref 3.5–5.1)
Sodium: 139 mmol/L (ref 135–145)

## 2021-06-21 LAB — TROPONIN I (HIGH SENSITIVITY): Troponin I (High Sensitivity): 3 ng/L (ref <18)

## 2021-06-21 NOTE — Telephone Encounter (Signed)
Patient complains of epigastric pain for the past few weeks with some "chest pressure" a few times on and off last time was last week. Denies any new dizziness or SOB ( she reports she "gets out or breath at times due to over weight but that has not change").  Epigastric discomfort not better with otc antiacid medications. Had pain on right side of back once last week.

## 2021-06-21 NOTE — Discharge Instructions (Addendum)
Continue prilosec  Your heart function is normal right now   See your doctor for follow up. See GI doctor and cardiologist   Return to ER if you have worse chest pain, shortness of breath, abdominal pain, vomiting, fever

## 2021-06-21 NOTE — ED Triage Notes (Signed)
Weeks of indigestion. She has been taking OTC antiacids. She has also had pressure in her chest. EKG at triage.

## 2021-06-21 NOTE — Telephone Encounter (Signed)
Pt currently on the way to the ED for chest pain

## 2021-06-21 NOTE — Telephone Encounter (Signed)
Pt received message on smart watch informing her of "irregular heart rate". Advised pt to speak with Triage to received recommendations on next step either medical attention or next appointment with pcp.

## 2021-06-21 NOTE — Telephone Encounter (Signed)
Pt. Wants to see if she has had the flu shot.

## 2021-06-21 NOTE — Telephone Encounter (Signed)
urse Assessment Nurse: Lane Hacker, RN, Windy Date/Time Lamount Cohen Time): 06/21/2021 12:18:24 PM Confirm and document reason for call. If symptomatic, describe symptoms. ---Caller states last night she used pulse ox and reading was ok last night and was reading 100 bpm. But the [facility] said HR 109 while she was laying down, and the watch said, "poor reading. as it does a ekg." Earlier in the day, she had felt anxious, and took 1/2 tab of Xanax. She felt tired, and laid down. Having indigestion in last 2 wks. Started taking Prilosec. She has had chest pressure for a few wks. off/on. Now HR is 64 bpm by [facility]. And now, and from the pulse ox 66 bpm O2 sat is 98% on RA. Denies chest pressure or CP or indigestion (last had chest pressure one wk ago; indigestion last had one wk ago). Upper right side abdomen pain yesterday lasted about 4-5 hrs. No diarrhea or vomiting. None today. Does the patient have any new or worsening symptoms? ---Yes Will a triage be completed? ---Yes Related visit to physician within the last 2 weeks? ---No Does the PT have any chronic conditions? (i.e. diabetes, asthma, this includes High risk factors for pregnancy, etc.) ---Yes List chronic conditions. ---diverticulosis, high cholesterol, overweight, HTN, hypothyroid, anxiety, reflux, osteoporosis, seasonal allergies Is this a behavioral health or substance abuse call? ---No PLEASE NOTE: All timestamps contained within this report are represented as [place] Standard Time. CONFIDENTIALTY NOTICE: This fax transmission is intended only for the addressee. It contains information that is legally privileged, confidential or otherwise protected from use or disclosure. If you are not the intended recipient, you are strictly prohibited from reviewing, disclosing, copying using or disseminating any of this information or taking any action in reliance on or regarding this information. If you have received this fax in error,  please notify us immediately by telephone so that we can arrange for its return to [place]. Phone: [phone], Toll-Free: [phone], Fax: [phone] Page: 2 of 2 Call Id: [id number] Nurse Assessment Guidelines Guideline Title Affirmed Question Affirmed Notes Nurse Date/Time Lamount Cohen Time) Abdominal Pain - Upper Abdominal pain Lane Hacker, RN, Windy 06/21/2021 12:31:26 PM Chest Pain [1] Chest pain lasts > 5 minutes AND [2] occurred > 3 days ago (72 hours) AND [3] NO chest pain or cardiac symptoms now Judene Companion, Elvin So 06/21/2021 12:36:38 PM Disp. Time Lamount Cohen Time) Disposition Final User 06/21/2021 12:36:25 PM [facility] [place], [place], Elvin So 06/21/2021 12:41:01 PM See PCP within 24 Hours Yes Lane Hacker, RN, Elvin So Caller Disagree/Comply Comply Caller Understands Yes PreDisposition Did not know what to do Care Advice Given Per Guideline HOME CARE: * You should be able to treat this at home. REASSURANCE AND EDUCATION - STOMACH PAIN: * It doesn't sound like a serious stomachache. CALL BACK IF: * Severe pain present over 1 hour * Constant pain present over 2 hours * You become worse CARE ADVICE given per Abdominal Pain, Upper (Adult) guideline. SEE PCP WITHIN 24 HOURS: * IF OFFICE WILL BE OPEN: You need to be examined within the next 24 hours. Call your doctor (or NP/PA) when the office opens and make an appointment. CALL BACK IF: * Difficulty breathing or unusual sweating occurs * Chest pain increases in frequency, duration or severity * Chest pain lasts over 5 minutes * You become worse CARE ADVICE given per Chest Pain (Adult) guideline. Comments User: Rubie Maid, RN Date/Time Lamount Cohen Time): 06/21/2021 12:33:04 PM Denies any upper abdominal pain today. Some SOB up climbing flights of stairs. Referrals REFERRED TO PCP OFFICE

## 2021-06-21 NOTE — Telephone Encounter (Signed)
Per Dr. Laury Axon, patient was advised because of the chest pressure episodes and the alarm about tachicardia, she needs to go get evaluated at the emergency room.  She verbalized understanding and agrees to go.

## 2021-06-21 NOTE — ED Provider Notes (Signed)
[facility] EMERGENCY DEPARTMENT Provider Note   CSN: [id number] Arrival date & time: 06/21/21  1532     History Chief Complaint  Patient presents with   Chest Pain    Kathryn Boyer is a 65 y.o. female hx of HL, HTN, here with chest pain. Patient has been having chest pain for the last several weeks. She states that it is worse at night. Has been trying pepcid with minimal relief. A week ago, she tried prilosec. She states that her symptoms improved. She called her doctor and was sent for evaluation. Has seen cardiology and GI in the past. No chest pain currently.   The history is provided by the patient.      Past Medical History:  Diagnosis Date   Anxiety    Asthma    Hyperlipidemia    Hypertension    Plantar fasciitis    Thyroid disease     Patient Active Problem List   Diagnosis Date Noted   Urine frequency 04/29/2019   Chest pain 04/29/2019   Urinary frequency 04/29/2019   Post-menopausal 04/29/2019   Allergic rhinitis 10/13/2018   Mild intermittent asthma with acute exacerbation 10/13/2018   Gastroesophageal reflux disease 10/13/2018   Right hip pain 06/01/2018   Chronic low back pain 05/25/2018   Acute pain of right knee 05/25/2018   Thoracic aorta atherosclerosis (HCC) 05/25/2018   Generalized anxiety disorder 03/22/2017   Hyperlipidemia LDL goal <100 03/22/2017   Hypothyroidism 03/22/2017   Osteoporosis 12/20/2016   Encounter for counseling 09/18/2016   Morbid obesity (HCC) 02/06/2016   Vaginal dryness 02/06/2016   Allergic rhinitis due to pollen 02/01/2016   Acute maxillary sinusitis 02/01/2016   Essential hypertension 02/01/2016   Asthma with acute exacerbation 02/01/2016   Lumbar radiculopathy, acute 11/01/2015   Osteoporosis, 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     Family History  Problem Relation Age of Onset   Heart disease Mother    Stroke Mother    Hypertension Mother    Cancer Mother        ovarian   Aneurysm Father    Hypertension Father    Heart disease Father    Kidney disease Father    Heart disease Sister     Social History   Tobacco Use   Smoking status: Never   Smokeless tobacco: Never  Vaping Use   Vaping Use: Never used  Substance Use Topics   Alcohol use: No   Drug use: No    Home Medications Prior to Admission medications   Medication Sig Start Date End Date Taking? Authorizing Provider  ALPRAZolam (XANAX) 0.25 MG tablet Take 1 tablet (0.25 mg total) by mouth daily as needed. 04/19/21   Donato Schultz, DO  cyclobenzaprine (FLEXERIL) 5 MG tablet Take 1 tablet (5 mg total) by mouth 3 (three) times daily as needed for muscle spasms. 01/25/20   Sandford Craze, NP  estradiol (ESTRACE) 0.1 MG/GM vaginal cream Place 1 Applicatorful vaginally 3 (three) times a week. 12/26/17   Donato Schultz, DO  estradiol (VIVELLE-DOT) 0.025 MG/24HR Place 1 patch onto the skin 2 (two) times a week. 10/23/20   Seabron Spates R, DO  famotidine (PEPCID)  20 MG tablet TAKE 1 TABLET BY MOUTH TWICE DAILY 03/03/20   Ambs, Norvel Richards, FNP  flunisolide (NASALIDE) 25 MCG/ACT (0.025%) SOLN Place 2 sprays into the nose 2 (two) times daily. 05/13/16   Fletcher Anon, MD  fluticasone (FLOVENT HFA) 110 MCG/ACT inhaler Inhale 2 puffs into the lungs 2 (two) times daily. 10/12/19   Hetty Blend, FNP  levothyroxine (SYNTHROID) 100 MCG tablet TAKE 1 TABLET BY MOUTH EVERY DAY 04/23/21   Zola Button, Yvonne R, DO  montelukast (SINGULAIR) 10 MG tablet Take 1 tablet (10 mg total) by mouth at bedtime. 10/12/19   Hetty Blend, FNP  olmesartan (BENICAR) 40 MG tablet TAKE 1 TABLET(40 MG) BY MOUTH DAILY 05/10/21   Zola Button, Myrene Buddy R, DO  pravastatin (PRAVACHOL) 20 MG tablet TAKE 1 TABLET(20  MG) BY MOUTH DAILY 05/08/21   Zola Button, Yvonne R, DO  risedronate (ACTONEL) 35 MG tablet TAKE 1 TAB BY MOUTH EVERY 7 DAYS WITH WATER ON EMPTY STOMACH, NOTHING BY MOUTH OR LIE DOWN FOR NEXT 30 MINUTES. Patient not taking: No sig reported 11/23/19   Zola Button, Grayling Congress, DO  VENTOLIN HFA 108 (90 Base) MCG/ACT inhaler Inhale 2 puffs into the lungs every 4 (four) hours as needed for wheezing or shortness of breath. 10/12/19   Ambs, Norvel Richards, FNP  Vitamin D, Ergocalciferol, (DRISDOL) 1.25 MG (50000 UT) CAPS capsule TAKE 1 CAPSULE (50,000 UNITS TOTAL) BY MOUTH EVERY 7 (SEVEN) DAYS. Patient not taking: No sig reported 09/10/18   Zola Button, Myrene Buddy R, DO    Allergies    Contrast media [iodinated diagnostic agents], Isovue [iopamidol], Amoxicillin, Belviq xr [lorcaserin hcl er], Saxenda [liraglutide -weight management], and Penicillins  Review of Systems   Review of Systems  Cardiovascular:  Positive for chest pain.  All other systems reviewed and are negative.  Physical Exam Updated Vital Signs BP (!) 148/90 (BP Location: Right Arm)   Pulse 79   Temp 97.9 F (36.6 C) (Oral)   Resp 20   Ht 5\' 7"  (1.702 m)   Wt 83 kg   SpO2 99%   BMI 28.66 kg/m   Physical Exam Vitals and nursing note reviewed.  Constitutional:      Appearance: She is well-developed.  HENT:     Head: Normocephalic.  Eyes:     Extraocular Movements: Extraocular movements intact.     Pupils: Pupils are equal, round, and reactive to light.  Cardiovascular:     Rate and Rhythm: Normal rate and regular rhythm.     Heart sounds: Normal heart sounds.  Pulmonary:     Effort: Pulmonary effort is normal.     Breath sounds: Normal breath sounds.  Abdominal:     General: Bowel sounds are normal.     Palpations: Abdomen is soft.  Musculoskeletal:        General: Normal range of motion.     Cervical back: Normal range of motion and neck supple.  Skin:    General: Skin is warm.  Neurological:     General: No focal deficit  present.     Mental Status: She is alert and oriented to person, place, and time.  Psychiatric:        Mood and Affect: Mood normal.        Behavior: Behavior normal.    ED Results / Procedures / Treatments   Labs (all labs ordered are listed, but only abnormal results are displayed) Labs Reviewed  BASIC METABOLIC PANEL - Abnormal; Notable for  the following components:      Result Value   Glucose, Bld 107 (*)    Creatinine, Ser 1.04 (*)    GFR, Estimated 59 (*)    All other components within normal limits  CBC - Abnormal; Notable for the following components:   Hemoglobin 15.4 (*)    All other components within normal limits  TROPONIN I (HIGH SENSITIVITY)  TROPONIN I (HIGH SENSITIVITY)    EKG EKG Interpretation  Date/Time:  Thursday June 21 2021 15:48:46 EDT Ventricular Rate:  78 PR Interval:  158 QRS Duration: 80 QT Interval:  352 QTC Calculation: 401 R Axis:   24 Text Interpretation: Normal sinus rhythm with sinus arrhythmia Low voltage QRS Cannot rule out Anterior infarct , age undetermined Abnormal ECG No significant change since last tracing Confirmed by Richardean Canal [phone]) on 06/21/2021 6:26:32 PM  Radiology DG Chest 2 View  Result Date: 06/21/2021 CLINICAL DATA:  Chest pressure EXAM: CHEST - 2 VIEW COMPARISON:  Chest radiograph 05/25/2018 FINDINGS: The cardiomediastinal silhouette is normal. The lungs are clear, with no focal consolidation or pulmonary edema. There is no pleural effusion or pneumothorax. There is no acute osseous abnormality. IMPRESSION: No radiographic evidence of acute cardiopulmonary process. Electronically Signed   By: Lesia Hausen M.D.   On: 06/21/2021 16:08    Procedures Procedures   Medications Ordered in ED Medications - No data to display  ED Course  I have reviewed the triage vital signs and the nursing notes.  Pertinent labs & imaging results that were available during my care of the patient were reviewed by me and considered  in my medical decision making (see chart for details).    MDM Rules/Calculators/A&P                           Kathryn Boyer is a 65 y.o. female here with chest pain. Chest pain for several weeks, worse at night. I think likely reflux. Trop neg x 1 and I have low suspicion for ACS. I doubt dissection. Will have her continue prilosec and follow up with her GI and cardiologist    Final Clinical Impression(s) / ED Diagnoses Final diagnoses:  None    Rx / DC Orders ED Discharge Orders     None        Charlynne Pander, MD 06/21/21 1843

## 2021-06-27 DIAGNOSIS — R109 Unspecified abdominal pain: Secondary | ICD-10-CM | POA: Diagnosis not present

## 2021-06-27 DIAGNOSIS — K5900 Constipation, unspecified: Secondary | ICD-10-CM | POA: Diagnosis not present

## 2021-07-05 DIAGNOSIS — R1084 Generalized abdominal pain: Secondary | ICD-10-CM | POA: Diagnosis not present

## 2021-07-05 DIAGNOSIS — K219 Gastro-esophageal reflux disease without esophagitis: Secondary | ICD-10-CM | POA: Diagnosis not present

## 2021-07-09 DIAGNOSIS — R109 Unspecified abdominal pain: Secondary | ICD-10-CM | POA: Diagnosis not present

## 2021-07-09 DIAGNOSIS — R1084 Generalized abdominal pain: Secondary | ICD-10-CM | POA: Diagnosis not present

## 2021-07-09 DIAGNOSIS — K760 Fatty (change of) liver, not elsewhere classified: Secondary | ICD-10-CM | POA: Diagnosis not present

## 2021-07-20 ENCOUNTER — Inpatient Hospital Stay (HOSPITAL_COMMUNITY): Payer: BC Managed Care – PPO

## 2021-07-20 ENCOUNTER — Inpatient Hospital Stay (HOSPITAL_COMMUNITY)
Admission: EM | Admit: 2021-07-20 | Discharge: 2021-07-27 | DRG: 483 | Disposition: A | Discharge status: Rehabilitation Facility | Payer: BC Managed Care – PPO | Attending: Orthopaedic Surgery | Admitting: Orthopaedic Surgery

## 2021-07-20 ENCOUNTER — Emergency Department (HOSPITAL_COMMUNITY): Payer: BC Managed Care – PPO

## 2021-07-20 ENCOUNTER — Encounter (HOSPITAL_COMMUNITY): Payer: Self-pay | Admitting: Emergency Medicine

## 2021-07-20 ENCOUNTER — Other Ambulatory Visit: Payer: Self-pay

## 2021-07-20 DIAGNOSIS — M810 Age-related osteoporosis without current pathological fracture: Secondary | ICD-10-CM | POA: Diagnosis present

## 2021-07-20 DIAGNOSIS — S82142D Displaced bicondylar fracture of left tibia, subsequent encounter for closed fracture with routine healing: Secondary | ICD-10-CM | POA: Diagnosis not present

## 2021-07-20 DIAGNOSIS — S42292A Other displaced fracture of upper end of left humerus, initial encounter for closed fracture: Secondary | ICD-10-CM

## 2021-07-20 DIAGNOSIS — Z471 Aftercare following joint replacement surgery: Secondary | ICD-10-CM | POA: Diagnosis not present

## 2021-07-20 DIAGNOSIS — M62838 Other muscle spasm: Secondary | ICD-10-CM | POA: Diagnosis not present

## 2021-07-20 DIAGNOSIS — S42435A Nondisplaced fracture (avulsion) of lateral epicondyle of left humerus, initial encounter for closed fracture: Secondary | ICD-10-CM | POA: Diagnosis not present

## 2021-07-20 DIAGNOSIS — S82142A Displaced bicondylar fracture of left tibia, initial encounter for closed fracture: Secondary | ICD-10-CM | POA: Diagnosis not present

## 2021-07-20 DIAGNOSIS — Z96612 Presence of left artificial shoulder joint: Secondary | ICD-10-CM | POA: Diagnosis not present

## 2021-07-20 DIAGNOSIS — S82145A Nondisplaced bicondylar fracture of left tibia, initial encounter for closed fracture: Secondary | ICD-10-CM | POA: Diagnosis present

## 2021-07-20 DIAGNOSIS — M24872 Other specific joint derangements of left ankle, not elsewhere classified: Secondary | ICD-10-CM | POA: Diagnosis not present

## 2021-07-20 DIAGNOSIS — M79642 Pain in left hand: Secondary | ICD-10-CM | POA: Diagnosis not present

## 2021-07-20 DIAGNOSIS — Z91041 Radiographic dye allergy status: Secondary | ICD-10-CM | POA: Diagnosis not present

## 2021-07-20 DIAGNOSIS — S42242A 4-part fracture of surgical neck of left humerus, initial encounter for closed fracture: Secondary | ICD-10-CM | POA: Diagnosis not present

## 2021-07-20 DIAGNOSIS — S92355A Nondisplaced fracture of fifth metatarsal bone, left foot, initial encounter for closed fracture: Secondary | ICD-10-CM

## 2021-07-20 DIAGNOSIS — S82852A Displaced trimalleolar fracture of left lower leg, initial encounter for closed fracture: Secondary | ICD-10-CM | POA: Diagnosis present

## 2021-07-20 DIAGNOSIS — S42202A Unspecified fracture of upper end of left humerus, initial encounter for closed fracture: Secondary | ICD-10-CM | POA: Diagnosis not present

## 2021-07-20 DIAGNOSIS — S42309A Unspecified fracture of shaft of humerus, unspecified arm, initial encounter for closed fracture: Secondary | ICD-10-CM | POA: Diagnosis present

## 2021-07-20 DIAGNOSIS — M25519 Pain in unspecified shoulder: Secondary | ICD-10-CM | POA: Diagnosis not present

## 2021-07-20 DIAGNOSIS — E039 Hypothyroidism, unspecified: Secondary | ICD-10-CM | POA: Diagnosis not present

## 2021-07-20 DIAGNOSIS — S8264XA Nondisplaced fracture of lateral malleolus of right fibula, initial encounter for closed fracture: Secondary | ICD-10-CM | POA: Diagnosis not present

## 2021-07-20 DIAGNOSIS — M545 Low back pain, unspecified: Secondary | ICD-10-CM | POA: Diagnosis not present

## 2021-07-20 DIAGNOSIS — Y9301 Activity, walking, marching and hiking: Secondary | ICD-10-CM | POA: Diagnosis present

## 2021-07-20 DIAGNOSIS — S92351A Displaced fracture of fifth metatarsal bone, right foot, initial encounter for closed fracture: Secondary | ICD-10-CM | POA: Diagnosis not present

## 2021-07-20 DIAGNOSIS — F419 Anxiety disorder, unspecified: Secondary | ICD-10-CM | POA: Diagnosis not present

## 2021-07-20 DIAGNOSIS — Z8249 Family history of ischemic heart disease and other diseases of the circulatory system: Secondary | ICD-10-CM | POA: Diagnosis not present

## 2021-07-20 DIAGNOSIS — Z8679 Personal history of other diseases of the circulatory system: Secondary | ICD-10-CM | POA: Diagnosis not present

## 2021-07-20 DIAGNOSIS — R339 Retention of urine, unspecified: Secondary | ICD-10-CM | POA: Diagnosis not present

## 2021-07-20 DIAGNOSIS — Z79899 Other long term (current) drug therapy: Secondary | ICD-10-CM

## 2021-07-20 DIAGNOSIS — R0902 Hypoxemia: Secondary | ICD-10-CM | POA: Diagnosis not present

## 2021-07-20 DIAGNOSIS — F411 Generalized anxiety disorder: Secondary | ICD-10-CM | POA: Diagnosis not present

## 2021-07-20 DIAGNOSIS — I10 Essential (primary) hypertension: Secondary | ICD-10-CM | POA: Diagnosis not present

## 2021-07-20 DIAGNOSIS — Z9889 Other specified postprocedural states: Secondary | ICD-10-CM | POA: Diagnosis not present

## 2021-07-20 DIAGNOSIS — G8918 Other acute postprocedural pain: Secondary | ICD-10-CM | POA: Diagnosis not present

## 2021-07-20 DIAGNOSIS — E785 Hyperlipidemia, unspecified: Secondary | ICD-10-CM | POA: Diagnosis not present

## 2021-07-20 DIAGNOSIS — Z419 Encounter for procedure for purposes other than remedying health state, unspecified: Secondary | ICD-10-CM

## 2021-07-20 DIAGNOSIS — S82832A Other fracture of upper and lower end of left fibula, initial encounter for closed fracture: Secondary | ICD-10-CM | POA: Diagnosis not present

## 2021-07-20 DIAGNOSIS — Z09 Encounter for follow-up examination after completed treatment for conditions other than malignant neoplasm: Secondary | ICD-10-CM

## 2021-07-20 DIAGNOSIS — S42202S Unspecified fracture of upper end of left humerus, sequela: Secondary | ICD-10-CM | POA: Diagnosis not present

## 2021-07-20 DIAGNOSIS — M546 Pain in thoracic spine: Secondary | ICD-10-CM | POA: Diagnosis not present

## 2021-07-20 DIAGNOSIS — Z20822 Contact with and (suspected) exposure to covid-19: Secondary | ICD-10-CM | POA: Diagnosis present

## 2021-07-20 DIAGNOSIS — M25522 Pain in left elbow: Secondary | ICD-10-CM | POA: Diagnosis not present

## 2021-07-20 DIAGNOSIS — S82852D Displaced trimalleolar fracture of left lower leg, subsequent encounter for closed fracture with routine healing: Secondary | ICD-10-CM | POA: Diagnosis not present

## 2021-07-20 DIAGNOSIS — F4322 Adjustment disorder with anxiety: Secondary | ICD-10-CM | POA: Diagnosis not present

## 2021-07-20 DIAGNOSIS — Z01818 Encounter for other preprocedural examination: Secondary | ICD-10-CM | POA: Diagnosis not present

## 2021-07-20 DIAGNOSIS — Z79890 Hormone replacement therapy (postmenopausal): Secondary | ICD-10-CM | POA: Diagnosis not present

## 2021-07-20 DIAGNOSIS — K5901 Slow transit constipation: Secondary | ICD-10-CM | POA: Diagnosis present

## 2021-07-20 DIAGNOSIS — S82135A Nondisplaced fracture of medial condyle of left tibia, initial encounter for closed fracture: Secondary | ICD-10-CM | POA: Diagnosis not present

## 2021-07-20 DIAGNOSIS — S92351D Displaced fracture of fifth metatarsal bone, right foot, subsequent encounter for fracture with routine healing: Secondary | ICD-10-CM | POA: Diagnosis not present

## 2021-07-20 DIAGNOSIS — M25512 Pain in left shoulder: Secondary | ICD-10-CM | POA: Diagnosis present

## 2021-07-20 DIAGNOSIS — Y92008 Other place in unspecified non-institutional (private) residence as the place of occurrence of the external cause: Secondary | ICD-10-CM | POA: Diagnosis not present

## 2021-07-20 DIAGNOSIS — W19XXXA Unspecified fall, initial encounter: Secondary | ICD-10-CM

## 2021-07-20 DIAGNOSIS — S82302A Unspecified fracture of lower end of left tibia, initial encounter for closed fracture: Secondary | ICD-10-CM | POA: Diagnosis not present

## 2021-07-20 DIAGNOSIS — M7989 Other specified soft tissue disorders: Secondary | ICD-10-CM | POA: Diagnosis not present

## 2021-07-20 DIAGNOSIS — D62 Acute posthemorrhagic anemia: Secondary | ICD-10-CM | POA: Diagnosis not present

## 2021-07-20 DIAGNOSIS — R0781 Pleurodynia: Secondary | ICD-10-CM | POA: Diagnosis not present

## 2021-07-20 DIAGNOSIS — M8000XA Age-related osteoporosis with current pathological fracture, unspecified site, initial encounter for fracture: Secondary | ICD-10-CM | POA: Diagnosis not present

## 2021-07-20 DIAGNOSIS — S93432A Sprain of tibiofibular ligament of left ankle, initial encounter: Secondary | ICD-10-CM | POA: Diagnosis not present

## 2021-07-20 DIAGNOSIS — R32 Unspecified urinary incontinence: Secondary | ICD-10-CM | POA: Diagnosis present

## 2021-07-20 DIAGNOSIS — S92354A Nondisplaced fracture of fifth metatarsal bone, right foot, initial encounter for closed fracture: Secondary | ICD-10-CM | POA: Diagnosis not present

## 2021-07-20 DIAGNOSIS — T07XXXA Unspecified multiple injuries, initial encounter: Secondary | ICD-10-CM | POA: Diagnosis not present

## 2021-07-20 DIAGNOSIS — S42212A Unspecified displaced fracture of surgical neck of left humerus, initial encounter for closed fracture: Secondary | ICD-10-CM | POA: Diagnosis not present

## 2021-07-20 DIAGNOSIS — S82891A Other fracture of right lower leg, initial encounter for closed fracture: Secondary | ICD-10-CM

## 2021-07-20 DIAGNOSIS — S82132A Displaced fracture of medial condyle of left tibia, initial encounter for closed fracture: Secondary | ICD-10-CM | POA: Diagnosis not present

## 2021-07-20 DIAGNOSIS — S82892A Other fracture of left lower leg, initial encounter for closed fracture: Secondary | ICD-10-CM | POA: Diagnosis not present

## 2021-07-20 DIAGNOSIS — S82142S Displaced bicondylar fracture of left tibia, sequela: Secondary | ICD-10-CM | POA: Diagnosis not present

## 2021-07-20 DIAGNOSIS — S42252A Displaced fracture of greater tuberosity of left humerus, initial encounter for closed fracture: Secondary | ICD-10-CM | POA: Diagnosis not present

## 2021-07-20 DIAGNOSIS — W010XXA Fall on same level from slipping, tripping and stumbling without subsequent striking against object, initial encounter: Secondary | ICD-10-CM | POA: Diagnosis present

## 2021-07-20 DIAGNOSIS — K219 Gastro-esophageal reflux disease without esophagitis: Secondary | ICD-10-CM | POA: Diagnosis not present

## 2021-07-20 DIAGNOSIS — K5903 Drug induced constipation: Secondary | ICD-10-CM | POA: Diagnosis not present

## 2021-07-20 DIAGNOSIS — Z90710 Acquired absence of both cervix and uterus: Secondary | ICD-10-CM | POA: Diagnosis not present

## 2021-07-20 DIAGNOSIS — S8264XD Nondisplaced fracture of lateral malleolus of right fibula, subsequent encounter for closed fracture with routine healing: Secondary | ICD-10-CM | POA: Diagnosis not present

## 2021-07-20 HISTORY — DX: Unspecified fall, initial encounter: W19.XXXA

## 2021-07-20 LAB — BASIC METABOLIC PANEL
Anion gap: 9 (ref 5–15)
BUN: 12 mg/dL (ref 8–23)
CO2: 22 mmol/L (ref 22–32)
Calcium: 8.8 mg/dL — ABNORMAL LOW (ref 8.9–10.3)
Chloride: 105 mmol/L (ref 98–111)
Creatinine, Ser: 0.86 mg/dL (ref 0.44–1.00)
GFR, Estimated: >60 mL/min (ref >60)
Glucose, Bld: 145 mg/dL — ABNORMAL HIGH (ref 70–99)
Potassium: 3.9 mmol/L (ref 3.5–5.1)
Sodium: 136 mmol/L (ref 135–145)

## 2021-07-20 LAB — CBC WITH DIFFERENTIAL/PLATELET
Abs Immature Granulocytes: 0.04 10*3/uL (ref 0.00–0.07)
Basophils Absolute: 0.1 10*3/uL (ref 0.0–0.1)
Basophils Relative: 0 %
Eosinophils Absolute: 0 10*3/uL (ref 0.0–0.5)
Eosinophils Relative: 0 %
HCT: 43.4 % (ref 36.0–46.0)
Hemoglobin: 14.7 g/dL (ref 12.0–15.0)
Immature Granulocytes: 0 %
Lymphocytes Relative: 12 %
Lymphs Abs: 1.7 10*3/uL (ref 0.7–4.0)
MCH: 30.9 pg (ref 26.0–34.0)
MCHC: 33.9 g/dL (ref 30.0–36.0)
MCV: 91.2 fL (ref 80.0–100.0)
Monocytes Absolute: 0.9 10*3/uL (ref 0.1–1.0)
Monocytes Relative: 7 %
Neutro Abs: 11.3 10*3/uL — ABNORMAL HIGH (ref 1.7–7.7)
Neutrophils Relative %: 81 %
Platelets: 239 10*3/uL (ref 150–400)
RBC: 4.76 MIL/uL (ref 3.87–5.11)
RDW: 12.4 % (ref 11.5–15.5)
WBC: 14 10*3/uL — ABNORMAL HIGH (ref 4.0–10.5)
nRBC: 0 % (ref 0.0–0.2)

## 2021-07-20 LAB — RESP PANEL BY RT-PCR (FLU A&B, COVID) ARPGX2
Influenza A by PCR: NEGATIVE
Influenza B by PCR: NEGATIVE
SARS Coronavirus 2 by RT PCR: NEGATIVE

## 2021-07-20 MED ORDER — OXYCODONE HCL 5 MG PO TABS
5.0000 mg | ORAL_TABLET | Freq: Four times a day (QID) | ORAL | Status: DC | PRN
  Administered 2021-07-20 – 2021-07-21 (×4): 5 mg via ORAL
  Filled 2021-07-20 (×3): qty 1

## 2021-07-20 MED ORDER — ONDANSETRON HCL 4 MG/2ML IJ SOLN
4.0000 mg | Freq: Once | INTRAMUSCULAR | Status: AC
  Administered 2021-07-20: 4 mg via INTRAVENOUS
  Filled 2021-07-20: qty 2

## 2021-07-20 MED ORDER — PRAVASTATIN SODIUM 40 MG PO TABS
20.0000 mg | ORAL_TABLET | Freq: Every day | ORAL | Status: DC
  Administered 2021-07-22 – 2021-07-27 (×6): 20 mg via ORAL
  Filled 2021-07-20 (×7): qty 1

## 2021-07-20 MED ORDER — FENTANYL CITRATE PF 50 MCG/ML IJ SOSY
150.0000 ug | PREFILLED_SYRINGE | Freq: Once | INTRAMUSCULAR | Status: AC
  Administered 2021-07-20: 150 ug via INTRAVENOUS
  Filled 2021-07-20: qty 3

## 2021-07-20 MED ORDER — OXYCODONE HCL 5 MG PO TABS
5.0000 mg | ORAL_TABLET | Freq: Once | ORAL | Status: AC
  Filled 2021-07-20: qty 1

## 2021-07-20 MED ORDER — HYDROMORPHONE HCL 1 MG/ML IJ SOLN
0.5000 mg | Freq: Once | INTRAMUSCULAR | Status: AC
  Administered 2021-07-20: 0.5 mg via INTRAVENOUS
  Filled 2021-07-20: qty 1

## 2021-07-20 MED ORDER — METOPROLOL TARTRATE 5 MG/5ML IV SOLN: 5.0000 mg | Freq: Four times a day (QID) | INTRAVENOUS | Status: DC | PRN

## 2021-07-20 MED ORDER — MORPHINE SULFATE (PF) 2 MG/ML IV SOLN
2.0000 mg | INTRAVENOUS | Status: DC | PRN
  Administered 2021-07-20 – 2021-07-22 (×8): 2 mg via INTRAVENOUS
  Filled 2021-07-20 (×8): qty 1

## 2021-07-20 MED ORDER — FENTANYL CITRATE PF 50 MCG/ML IJ SOSY: 50.0000 ug | PREFILLED_SYRINGE | INTRAMUSCULAR | Status: DC | PRN

## 2021-07-20 MED ORDER — ENOXAPARIN SODIUM 40 MG/0.4ML IJ SOSY
40.0000 mg | PREFILLED_SYRINGE | INTRAMUSCULAR | Status: DC
  Administered 2021-07-20 – 2021-07-22 (×3): 40 mg via SUBCUTANEOUS
  Filled 2021-07-20 (×3): qty 0.4

## 2021-07-20 MED ORDER — ONDANSETRON HCL 4 MG PO TABS
4.0000 mg | ORAL_TABLET | Freq: Four times a day (QID) | ORAL | Status: DC | PRN
  Filled 2021-07-20: qty 1

## 2021-07-20 MED ORDER — ONDANSETRON HCL 4 MG/2ML IJ SOLN
4.0000 mg | Freq: Once | INTRAMUSCULAR | Status: DC
  Filled 2021-07-20: qty 2

## 2021-07-20 MED ORDER — ONDANSETRON HCL 4 MG/2ML IJ SOLN
4.0000 mg | Freq: Four times a day (QID) | INTRAMUSCULAR | Status: DC | PRN
  Administered 2021-07-21 – 2021-07-23 (×3): 4 mg via INTRAVENOUS
  Filled 2021-07-20 (×2): qty 2

## 2021-07-20 MED ORDER — SODIUM CHLORIDE 0.9 % IV SOLN: Freq: Once | INTRAVENOUS | Status: AC

## 2021-07-20 MED ORDER — LEVOTHYROXINE SODIUM 100 MCG PO TABS
100.0000 ug | ORAL_TABLET | Freq: Every day | ORAL | Status: DC
  Administered 2021-07-21 – 2021-07-27 (×5): 100 ug via ORAL
  Filled 2021-07-20 (×5): qty 1

## 2021-07-20 MED ORDER — LIDOCAINE-EPINEPHRINE (PF) 2 %-1:200000 IJ SOLN
10.0000 mL | Freq: Once | INTRAMUSCULAR | Status: AC
  Administered 2021-07-20: 10 mL via INTRADERMAL
  Filled 2021-07-20: qty 20

## 2021-07-20 NOTE — H&P (Signed)
History and Physical    Laporscha Linehan BUL:[id number] DOB: Jan 26, 1956 DOA: 07/20/2021  PCP: Donato Schultz, DO  Patient coming from: Home  Chief Complaint: fall, foot and shoulder pain  HPI: Ilanna Deihl is a 65 y.o. female with medical history significant of hypothyroidism, GERD, HLD, anxiety. Presenting b/l foot pain and left shoulder pain after a fall. She was walking out of her house today and tripper over some carpet cleaning hoses. She fell onto her left side. She remembers the entire fall. There was no LOC or head injury. There were no heralding symptoms. She was unable to get up after her fall. Family came to her aide and called EMS. She denies any other aggravating or alleviating factors.    ED Course: XR showed: Moderately displaced trimalleolar ankle fracture; Minimally displaced fracture of the base of the right fifth metatarsal; nondisplaced fracture of the lateral malleolus; Comminuted, impacted left proximal humerus fracture. Orthopedics was consulted. [facility] was called for admission.   Review of Systems:  Denies CP, dyspnea, abdominal pain, N/V/D, fevers, lightheadedness, dizziness, syncopal episodes. Review of systems is otherwise negative for all not mentioned in HPI.   PMHx Past Medical History:  Diagnosis Date   Anxiety    Asthma    Hyperlipidemia    Hypertension    Plantar fasciitis    Thyroid disease     PSHx Past Surgical History:  Procedure Laterality Date   ABDOMINAL HYSTERECTOMY     BREAST SURGERY     NOSE SURGERY      SocHx  reports that she has never smoked. She has never used smokeless tobacco. She reports that she does not drink alcohol and does not use drugs.  Allergies  Allergen Reactions   Contrast Media [Iodinated Diagnostic Agents] Hives   Isovue [Iopamidol] Hives    After returning home pt called to inform technologist of rash to face and neck, denies difficulty breathing or swallowing, pt took 2 benedryl Will need premeds in future    Amoxicillin Other (See Comments)    Headache    Belviq Xr [Lorcaserin Hcl Er] Itching    Caused her face to turn red.   Saxenda [Liraglutide -Weight Management] Other (See Comments)    Caused indigestion.   Penicillins Rash    FamHx Family History  Problem Relation Age of Onset   Heart disease Mother    Stroke Mother    Hypertension Mother    Cancer Mother        ovarian   Aneurysm Father    Hypertension Father    Heart disease Father    Kidney disease Father    Heart disease Sister     Prior to Admission medications   Medication Sig Start Date End Date Taking? Authorizing Provider  ALPRAZolam (XANAX) 0.25 MG tablet Take 1 tablet (0.25 mg total) by mouth daily as needed. Patient taking differently: Take 0.25 mg by mouth daily as needed for anxiety. 04/19/21  Yes Donato Schultz, DO  levothyroxine (SYNTHROID) 100 MCG tablet TAKE 1 TABLET BY MOUTH EVERY DAY Patient taking differently: Take 100 mcg by mouth daily before breakfast. 04/23/21  Yes Zola Button, Yvonne R, DO  olmesartan (BENICAR) 40 MG tablet TAKE 1 TABLET(40 MG) BY MOUTH DAILY Patient taking differently: Take 40 mg by mouth daily. 05/10/21  Yes Seabron Spates R, DO  pravastatin (PRAVACHOL) 20 MG tablet TAKE 1 TABLET(20 MG) BY MOUTH DAILY Patient taking differently: Take 20 mg by mouth daily. 05/08/21  Yes Zola Button,  Grayling Congress, DO  cyclobenzaprine (FLEXERIL) 5 MG tablet Take 1 tablet (5 mg total) by mouth 3 (three) times daily as needed for muscle spasms. Patient not taking: Reported on 07/20/2021 01/25/20   Sandford Craze, NP  estradiol (ESTRACE) 0.1 MG/GM vaginal cream Place 1 Applicatorful vaginally 3 (three) times a week. Patient not taking: Reported on 07/20/2021 12/26/17   Zola Button, Grayling Congress, DO  estradiol (VIVELLE-DOT) 0.025 MG/24HR Place 1 patch onto the skin 2 (two) times a week. Patient not taking: Reported on 07/20/2021 10/23/20   Zola Button, Myrene Buddy R, DO  famotidine (PEPCID) 20 MG tablet  TAKE 1 TABLET BY MOUTH TWICE DAILY Patient not taking: Reported on 07/20/2021 03/03/20   Hetty Blend, FNP  flunisolide (NASALIDE) 25 MCG/ACT (0.025%) SOLN Place 2 sprays into the nose 2 (two) times daily. Patient not taking: Reported on 07/20/2021 05/13/16   Fletcher Anon, MD  fluticasone (FLOVENT HFA) 110 MCG/ACT inhaler Inhale 2 puffs into the lungs 2 (two) times daily. Patient not taking: Reported on 07/20/2021 10/12/19   Hetty Blend, FNP  montelukast (SINGULAIR) 10 MG tablet Take 1 tablet (10 mg total) by mouth at bedtime. Patient not taking: Reported on 07/20/2021 10/12/19   Hetty Blend, FNP  risedronate (ACTONEL) 35 MG tablet TAKE 1 TAB BY MOUTH EVERY 7 DAYS WITH WATER ON EMPTY STOMACH, NOTHING BY MOUTH OR LIE DOWN FOR NEXT 30 MINUTES. Patient not taking: Reported on 03/06/2021 11/23/19   Zola Button, Grayling Congress, DO  VENTOLIN HFA [phone] Base) MCG/ACT inhaler Inhale 2 puffs into the lungs every 4 (four) hours as needed for wheezing or shortness of breath. Patient not taking: Reported on 07/20/2021 10/12/19   Hetty Blend, FNP  Vitamin D, Ergocalciferol, (DRISDOL) 1.25 MG (50000 UT) CAPS capsule TAKE 1 CAPSULE (50,000 UNITS TOTAL) BY MOUTH EVERY 7 (SEVEN) DAYS. Patient not taking: Reported on 03/06/2021 09/10/18   Donato Schultz, DO    Physical Exam: Vitals:   07/20/21 1034 07/20/21 1036 07/20/21 1130 07/20/21 1337  BP:  (!) 157/85 (!) 151/76 (!) 155/91  Pulse:  (!) 106 (!) 107 (!) 102  Resp:  18 18 18   Temp:  97.8 F (36.6 C)    TempSrc:  Oral    SpO2:  91% 93% 97%  Weight: 80.7 kg     Height: 5\' 7"  (1.702 m)       General: 65 y.o. female resting in bed in NAD Eyes: PERRL, normal sclera ENMT: Nares patent w/o discharge, orophaynx clear, dentition normal, ears w/o discharge/lesions/ulcers Neck: Supple, trachea midline Cardiovascular: tachy, +S1, S2, no m/g/r, equal pulses throughout Respiratory: CTABL, no w/r/r, normal WOB GI: BS+, NDNT, no masses noted, no organomegaly  noted MSK: LLE in boot, RLE in brace, L shoulder in sling; no e/c/c appreciated Neuro: A&O x 3, no focal deficits Psyc: Appropriate interaction and affect, calm/cooperative  Labs on Admission: I have personally reviewed following labs and imaging studies  CBC: Recent Labs  Lab 07/20/21 1131  WBC 14.0*  NEUTROABS 11.3*  HGB 14.7  HCT 43.4  MCV 91.2  PLT 239   Basic Metabolic Panel: Recent Labs  Lab 07/20/21 1131  NA 136  K 3.9  CL 105  CO2 22  GLUCOSE 145*  BUN 12  CREATININE 0.86  CALCIUM 8.8*   GFR: Estimated Creatinine Clearance: 70.3 mL/min (by C-G formula based on SCr of 0.86 mg/dL). Liver Function Tests: No results for input(s): AST, ALT, ALKPHOS, BILITOT, PROT, ALBUMIN in the  last 168 hours. No results for input(s): LIPASE, AMYLASE in the last 168 hours. No results for input(s): AMMONIA in the last 168 hours. Coagulation Profile: No results for input(s): INR, PROTIME in the last 168 hours. Cardiac Enzymes: No results for input(s): CKTOTAL, CKMB, CKMBINDEX, TROPONINI in the last 168 hours. BNP (last 3 results) No results for input(s): PROBNP in the last 8760 hours. HbA1C: No results for input(s): HGBA1C in the last 72 hours. CBG: No results for input(s): GLUCAP in the last 168 hours. Lipid Profile: No results for input(s): CHOL, HDL, LDLCALC, TRIG, CHOLHDL, LDLDIRECT in the last 72 hours. Thyroid Function Tests: No results for input(s): TSH, T4TOTAL, FREET4, T3FREE, THYROIDAB in the last 72 hours. Anemia Panel: No results for input(s): VITAMINB12, FOLATE, FERRITIN, TIBC, IRON, RETICCTPCT in the last 72 hours. Urine analysis:    Component Value Date/Time   GLUCOSEU Negative 04/29/2019 1036   BILIRUBINUR negative 04/29/2019 1036   PROTEINUR negative 09/22/2017 1106   UROBILINOGEN 0.2 04/29/2019 1036   NITRITE negative 04/29/2019 1036   LEUKOCYTESUR Negative 04/29/2019 1036    Radiological Exams on Admission: DG Ribs Unilateral W/Chest Left  Result  Date: 07/20/2021 CLINICAL DATA:  Left axillary rib pain extending into the anterior chest status post fall. EXAM: LEFT RIBS AND CHEST - 3+ VIEW COMPARISON:  Chest radiograph 06/21/2021 FINDINGS: Cardiomediastinal silhouette and pulmonary vasculature are within normal limits. The lungs are clear. No pneumothorax. Left proximal humerus fracture better seen on dedicated shoulder radiographs. No displaced left rib fractures identified. IMPRESSION: 1. No displaced left rib fracture. 2. Comminuted, impacted left proximal humerus fracture. Electronically Signed   By: Acquanetta Belling M.D.   On: 07/20/2021 13:09   DG Thoracic Spine 2 View  Result Date: 07/20/2021 CLINICAL DATA:  Pain status post fall. EXAM: THORACIC SPINE 2 VIEWS; LUMBAR SPINE - 2-3 VIEW COMPARISON:  Chest radiograph 06/21/2021 FINDINGS: Thoracic spine: Alignment within normal limits. Vertebral body heights are maintained. Minimal endplate spurring seen at multiple levels of the midthoracic spine. Lumbar spine: Alignment within normal limits. No significant vertebral body or disc height loss. Minimal endplate spurring seen at multiple levels. IMPRESSION: No acute radiographic abnormality of the thoracic or lumbar spine. Electronically Signed   By: Acquanetta Belling M.D.   On: 07/20/2021 13:19   DG Lumbar Spine 2-3 Views  Result Date: 07/20/2021 CLINICAL DATA:  Pain status post fall. EXAM: THORACIC SPINE 2 VIEWS; LUMBAR SPINE - 2-3 VIEW COMPARISON:  Chest radiograph 06/21/2021 FINDINGS: Thoracic spine: Alignment within normal limits. Vertebral body heights are maintained. Minimal endplate spurring seen at multiple levels of the midthoracic spine. Lumbar spine: Alignment within normal limits. No significant vertebral body or disc height loss. Minimal endplate spurring seen at multiple levels. IMPRESSION: No acute radiographic abnormality of the thoracic or lumbar spine. Electronically Signed   By: Acquanetta Belling M.D.   On: 07/20/2021 13:19   DG Elbow 2  Views Left  Result Date: 07/20/2021 CLINICAL DATA:  Left elbow pain status post fall. EXAM: LEFT ELBOW - 2 VIEW COMPARISON:  None. FINDINGS: Exam limited due to overlying artifact. No displaced fracture identified. Mild soft tissue swelling seen overlying the olecranon. IMPRESSION: Limited exam.  No displaced fracture identified. Electronically Signed   By: Acquanetta Belling M.D.   On: 07/20/2021 13:10   DG Ankle Complete Left  Result Date: 07/20/2021 CLINICAL DATA:  Left ankle pain status post fall. EXAM: LEFT ANKLE COMPLETE - 3+ VIEW; LEFT FOOT - COMPLETE 3+ VIEW COMPARISON:  None. FINDINGS: Left  ankle: Moderately displaced trimalleolar ankle fracture. There is disruption of the ankle mortise. Large ankle joint effusion. Enthesopathic changes are seen at the insertion of the plantar fascia and Achilles tendon on the calcaneus. Left foot: No fracture or dislocation. 3 x 1 mm radiopaque foreign body seen within the soft tissues along the plantar aspect of the forefoot. IMPRESSION: 1. Moderately displaced trimalleolar left ankle fracture. 2. 3 x 1 mm radiopaque foreign body seen within the soft tissues along the plantar aspect of the forefoot. Electronically Signed   By: Acquanetta Belling M.D.   On: 07/20/2021 13:24   DG Ankle Complete Right  Result Date: 07/20/2021 CLINICAL DATA:  Right ankle and foot pain status post fall EXAM: RIGHT ANKLE - COMPLETE 3+ VIEW; RIGHT FOOT COMPLETE - 3+ VIEW COMPARISON:  None. FINDINGS: Right ankle: Mild soft tissue swelling overlying the lateral malleolus. There is a subtle lucency within the lateral malleolus best seen on the oblique view which may be a prominent vascular channel versus nondisplaced fracture. Right foot: Mild hallux valgus deformity. Minimally displaced fracture of the base of the fifth metatarsal. Enthesopathic changes are seen at the insertion of the plantar fascia and Achilles tendon on the calcaneus. Nonspecific soft tissue calcifications noted adjacent to  the distal third metatarsal. This may be sequelae of remote trauma or represent external artifact. IMPRESSION: 1. Minimally displaced fracture of the base of the fifth metatarsal. 2. Findings suspicious for nondisplaced fracture of the lateral malleolus. Electronically Signed   By: Acquanetta Belling M.D.   On: 07/20/2021 13:16   DG Shoulder Left  Result Date: 07/20/2021 CLINICAL DATA:  Fall.  Left shoulder deformity and pain. EXAM: LEFT SHOULDER - 2+ VIEW COMPARISON:  None. FINDINGS: Comminuted, impacted fracture of the left proximal humerus. Visualized soft tissues are unremarkable. IMPRESSION: Comminuted, impacted left proximal humerus fracture. Electronically Signed   By: Acquanetta Belling M.D.   On: 07/20/2021 13:06   DG Hand Complete Left  Result Date: 07/20/2021 CLINICAL DATA:  Left hand pain status post fall. EXAM: LEFT HAND - COMPLETE 3+ VIEW COMPARISON:  None. FINDINGS: Deformity of the scaphoid most likely sequelae of prior trauma. Otherwise no acute fracture or dislocation. Mild degenerative changes seen at the first carpometacarpal joint. IMPRESSION: Deformity of the scaphoid most likely sequelae of remote trauma. If the patient has snuffbox tenderness, further evaluation with dedicated wrist radiographs should be obtained. Electronically Signed   By: Acquanetta Belling M.D.   On: 07/20/2021 13:12   DG Foot Complete Left  Result Date: 07/20/2021 CLINICAL DATA:  Left ankle pain status post fall. EXAM: LEFT ANKLE COMPLETE - 3+ VIEW; LEFT FOOT - COMPLETE 3+ VIEW COMPARISON:  None. FINDINGS: Left ankle: Moderately displaced trimalleolar ankle fracture. There is disruption of the ankle mortise. Large ankle joint effusion. Enthesopathic changes are seen at the insertion of the plantar fascia and Achilles tendon on the calcaneus. Left foot: No fracture or dislocation. 3 x 1 mm radiopaque foreign body seen within the soft tissues along the plantar aspect of the forefoot. IMPRESSION: 1. Moderately displaced  trimalleolar left ankle fracture. 2. 3 x 1 mm radiopaque foreign body seen within the soft tissues along the plantar aspect of the forefoot. Electronically Signed   By: Acquanetta Belling M.D.   On: 07/20/2021 13:24   DG Foot Complete Right  Result Date: 07/20/2021 CLINICAL DATA:  Right ankle and foot pain status post fall EXAM: RIGHT ANKLE - COMPLETE 3+ VIEW; RIGHT FOOT COMPLETE - 3+ VIEW COMPARISON:  None.  FINDINGS: Right ankle: Mild soft tissue swelling overlying the lateral malleolus. There is a subtle lucency within the lateral malleolus best seen on the oblique view which may be a prominent vascular channel versus nondisplaced fracture. Right foot: Mild hallux valgus deformity. Minimally displaced fracture of the base of the fifth metatarsal. Enthesopathic changes are seen at the insertion of the plantar fascia and Achilles tendon on the calcaneus. Nonspecific soft tissue calcifications noted adjacent to the distal third metatarsal. This may be sequelae of remote trauma or represent external artifact. IMPRESSION: 1. Minimally displaced fracture of the base of the fifth metatarsal. 2. Findings suspicious for nondisplaced fracture of the lateral malleolus. Electronically Signed   By: Acquanetta Belling M.D.   On: 07/20/2021 13:16   DG Knee 3 Views Left  Result Date: 07/20/2021 CLINICAL DATA:  Pain status post fall EXAM: LEFT KNEE - 3 VIEW COMPARISON:  None. FINDINGS: Minimally depressed fracture of the posterior lateral tibial plateau. No additional fracture or dislocation. Atherosclerotic changes seen throughout visualized arterial segments. IMPRESSION: Minimally depressed fracture of the posterolateral tibial plateau. Electronically Signed   By: Acquanetta Belling M.D.   On: 07/20/2021 13:20    EKG: None obtained in ED  Assessment/Plan Fall Bilateral ankle fractures Left shoulder fracture Right foot fracture     - admit to inpt, med-surg     - orthopedics consulted (Dr. Blanchie Dessert); appreciate assistance;  will keep NPO until she is eval'd     - pain control     - continue bracing     - consult TOC; consult PT/OT after ortho eval/plan  Hypothyroidism     - resume home regimen when she comes off NPO status  Anxiety     - resume home regimen when she comes off NPO status  GERD     - PPI  HLD     - resume home regimen when she comes off NPO status  DVT prophylaxis: lovenox  Code Status: FULL  Family Communication: None at bedside  Consults called: EDP spoke with orthopedics   Status is: Inpatient  Remains inpatient appropriate because: unsafe discharge plan  Teddy Spike DO [facility]  If 7PM-7AM, please contact night-coverage [email or web address]  07/20/2021, 2:27 PM

## 2021-07-20 NOTE — Progress Notes (Signed)
Orthopedic Tech Progress Note Patient Details:  Kathryn Boyer 06-Aug-1956 [id number]  Ortho Devices Type of Ortho Device: Short leg splint, Stirrup splint, Arm sling, CAM walker, Knee Immobilizer Ortho Device/Splint Location: Left Shoulder, Left Knee, Left Ankle and Right Foot Ortho Device/Splint Interventions: Application, Adjustment   Post Interventions Patient Tolerated: Well  Genelle Bal Kathryn Boyer 07/20/2021, 2:35 PM

## 2021-07-20 NOTE — Plan of Care (Signed)
  Problem: Education: Goal: Knowledge of General Education information will improve Description: Including pain rating scale, medication(s)/side effects and non-pharmacologic comfort measures Outcome: Progressing   Problem: Pain Managment: Goal: General experience of comfort will improve Outcome: Progressing   Problem: Safety: Goal: Ability to remain free from injury will improve Outcome: Progressing   

## 2021-07-20 NOTE — Consult Note (Signed)
ORTHOPAEDIC CONSULTATION  REQUESTING PHYSICIAN: Teddy Spike, DO  Chief Complaint: Fall down stairs with injuries to L ankle, R ankle, R foot, L shoulder, L knee  HPI: Kathryn Boyer is a 65 y.o. female who fell over her porch step earlier this morning landing on concrete sustaining multiple injuries. she was brought to the emergency department by EMS and was found to have injuries to her left shoulder, left knee, left ankle, right ankle, and right foot.  She denies any pain in her right upper extremity.  She denies any numbness and tingling in her upper or lower extremities.  Past Medical History:  Diagnosis Date   Anxiety    Asthma    Hyperlipidemia    Hypertension    Plantar fasciitis    Thyroid disease    Past Surgical History:  Procedure Laterality Date   ABDOMINAL HYSTERECTOMY     BREAST SURGERY     NOSE SURGERY     Social History   Socioeconomic History   Marital status: Married    Spouse name: Not on file   Number of children: Not on file   Years of education: Not on file   Highest education level: Not on file  Occupational History   Not on file  Tobacco Use   Smoking status: Never   Smokeless tobacco: Never  Vaping Use   Vaping Use: Never used  Substance and Sexual Activity   Alcohol use: No   Drug use: No   Sexual activity: Not on file  Other Topics Concern   Not on file  Social History Narrative   Exercise-sometimes   Social Determinants of Health   Financial Resource Strain: Not on file  Food Insecurity: Not on file  Transportation Needs: Not on file  Physical Activity: Not on file  Stress: Not on file  Social Connections: Not on file   Family History  Problem Relation Age of Onset   Heart disease Mother    Stroke Mother    Hypertension Mother    Cancer Mother        ovarian   Aneurysm Father    Hypertension Father    Heart disease Father    Kidney disease Father    Heart disease Sister    Allergies  Allergen Reactions   Contrast  Media [Iodinated Diagnostic Agents] Hives   Isovue [Iopamidol] Hives    After returning home pt called to inform technologist of rash to face and neck, denies difficulty breathing or swallowing, pt took 2 benedryl Will need premeds in future   Amoxicillin Other (See Comments)    Headache    Belviq Xr [Lorcaserin Hcl Er] Itching    Caused her face to turn red.   Saxenda [Liraglutide -Weight Management] Other (See Comments)    Caused indigestion.   Penicillins Rash     Positive ROS: All other systems have been reviewed and were otherwise negative with the exception of those mentioned in the HPI and as above.  Physical Exam: General: Alert, no acute distress Cardiovascular: No pedal edema Respiratory: No cyanosis, no use of accessory musculature Skin: No lesions in the area of chief complaint Neurologic: Sensation intact distally Psychiatric: Patient is competent for consent with normal mood and affect  MUSCULOSKELETAL:  RUE No traumatic wounds, ecchymosis, or rash  Nontender No elbow or wrist effusion  Sens median, radial, ulnar intact  Motor AIN, PIN, IO intact  Radial pulse 2+, No significant edema LUE Sling in place. ROM shoulder deferred given known fracture.  Tender about the left shoulder. Skin intact. No elbow or wrist effusion  Sens median, radial, ulnar intact  Motor AIN, PIN, IO intact  Radial pulse 2+, No significant edema RLE No traumatic wounds, ecchymosis, or rash  Tender about the lateral ankle and lateral midfoot.  No knee effusion  Sens DPN, SPN, TN intact  Motor EHL, FHL  intact  DP 2+ No significant edema LLE KI and short leg splint in place  Sens intact in exposed toes  Motor EHL, FHL intact  Exposed toes are wwp with brisk cap refill   IMAGING:  X-rays left shoulder demonstrate a displaced proximal humerus fracture X-rays and CT scan left ankle demonstrate a trimalleolar ankle fracture with articular impaction at the posterior malleolus.   Improved joint alignment after closed reduction and splinting. X-rays and CT scan left knee demonstrate mildly depressed medial plateau fracture X-rays right foot demonstrate a minimally displaced fifth metatarsal base fracture X-rays right ankle demonstrate a nondisplaced lateral malleolus fracture, mortise otherwise intact.  Assessment: Principal Problem:   Fall  Left trimalleolar ankle fracture dislocation status post closed reduction in the ED -Anticipate operative management -given articular impaction of the posterior malleolus will discuss with trauma specialist Dr. Jena Gauss and Dr. Carola Frost regarding further management -Nonweightbearing left lower extremity in short leg splint  Left depressed medial plateau fracture -We will discuss with Dr. Jena Gauss and Dr. Carola Frost regarding operative versus nonoperative management.  Left proximal humerus fracture -Obtain CT scan for further evaluation and consideration of treatment options: Nonoperative versus ORIF versus arthroplasty -Nonweightbearing left upper extremity in sling  Right nondisplaced lateral malleolus fracture -Likely nonoperative treatment -Nonweightbearing in cam boot  Right fifth metatarsal base fracture -Nonweightbearing in cam boot -Anticipate likely nonoperative treatment, fracture appears proximal to the watershed area.   Joen Laura, MD Cell [phone]

## 2021-07-20 NOTE — ED Provider Notes (Signed)
[facility] DEPT Provider Note   CSN: [id number] Arrival date & time: 07/20/21  1023     History Chief Complaint  Patient presents with   Fall   Ankle Pain   Shoulder Injury    Kathryn Boyer is a 65 y.o. female.  65 yo F with a chief complaint of a fall.  The patient was walking out of the house and was going down a landing and misplaced her footing and landed onto her left shoulder after twisting her ankle.  Complaining of pain to both feet and ankles and the left shoulder.  She denies head injury denies loss of consciousness denies neck pain.  She denies back pain denies abdominal pain denies difficulty breathing.  Denies pain to the hips.  Denies blood thinner use.  The history is provided by the patient and the spouse.  Fall This is a new problem. The current episode started less than 1 hour ago. The problem occurs constantly. The problem has not changed since onset.Pertinent negatives include no chest pain, no headaches and no shortness of breath. Nothing aggravates the symptoms. Nothing relieves the symptoms. She has tried nothing for the symptoms. The treatment provided no relief.  Ankle Pain Associated symptoms: no fever   Shoulder Injury Pertinent negatives include no chest pain, no headaches and no shortness of breath.      Past Medical History:  Diagnosis Date   Anxiety    Asthma    Hyperlipidemia    Hypertension    Plantar fasciitis    Thyroid disease     Patient Active Problem List   Diagnosis Date Noted   Fall 07/20/2021   Urine frequency 04/29/2019   Chest pain 04/29/2019   Urinary frequency 04/29/2019   Post-menopausal 04/29/2019   Allergic rhinitis 10/13/2018   Mild intermittent asthma with acute exacerbation 10/13/2018   Gastroesophageal reflux disease 10/13/2018   Right hip pain 06/01/2018   Chronic low back pain 05/25/2018   Acute pain of right knee 05/25/2018   Thoracic aorta atherosclerosis ([place]) 05/25/2018    Generalized anxiety disorder 03/22/2017   Hyperlipidemia LDL goal <100 03/22/2017   Hypothyroidism 03/22/2017   Osteoporosis 12/20/2016   Encounter for counseling 09/18/2016   Morbid obesity ([place]) 02/06/2016   Vaginal dryness 02/06/2016   Allergic rhinitis due to pollen 02/01/2016   Acute maxillary sinusitis 02/01/2016   Essential hypertension 02/01/2016   Asthma with acute exacerbation 02/01/2016   Lumbar radiculopathy, acute 11/01/2015   Osteoporosis, 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     Family History  Problem Relation Age of Onset   Heart disease Mother    Stroke Mother    Hypertension Mother    Cancer Mother        ovarian   Aneurysm Father    Hypertension Father    Heart disease Father    Kidney disease Father    Heart disease Sister     Social History   Tobacco Use   Smoking status: Never   Smokeless tobacco: Never  Vaping Use   Vaping Use: Never used  Substance Use Topics   Alcohol use: No   Drug use: No    Home Medications Prior to Admission medications  Medication Sig Start Date End Date Taking? Authorizing Provider  ALPRAZolam (XANAX) 0.25 MG tablet Take 1 tablet (0.25 mg total) by mouth daily as needed. Patient taking differently: Take 0.25 mg by mouth daily as needed for anxiety. 04/19/21  Yes Donato Schultz, DO  levothyroxine (SYNTHROID) 100 MCG tablet TAKE 1 TABLET BY MOUTH EVERY DAY Patient taking differently: Take 100 mcg by mouth daily before breakfast. 04/23/21  Yes Zola Button, Yvonne R, DO  olmesartan (BENICAR) 40 MG tablet TAKE 1 TABLET(40 MG) BY MOUTH DAILY Patient taking differently: Take 40 mg by mouth daily. 05/10/21  Yes Seabron Spates R, DO  pravastatin (PRAVACHOL) 20 MG tablet TAKE  1 TABLET(20 MG) BY MOUTH DAILY Patient taking differently: Take 20 mg by mouth daily. 05/08/21  Yes Donato Schultz, DO  cyclobenzaprine (FLEXERIL) 5 MG tablet Take 1 tablet (5 mg total) by mouth 3 (three) times daily as needed for muscle spasms. Patient not taking: Reported on 07/20/2021 01/25/20   Sandford Craze, NP  estradiol (ESTRACE) 0.1 MG/GM vaginal cream Place 1 Applicatorful vaginally 3 (three) times a week. Patient not taking: Reported on 07/20/2021 12/26/17   Zola Button, Grayling Congress, DO  estradiol (VIVELLE-DOT) 0.025 MG/24HR Place 1 patch onto the skin 2 (two) times a week. Patient not taking: Reported on 07/20/2021 10/23/20   Zola Button, Myrene Buddy R, DO  famotidine (PEPCID) 20 MG tablet TAKE 1 TABLET BY MOUTH TWICE DAILY Patient not taking: Reported on 07/20/2021 03/03/20   Hetty Blend, FNP  flunisolide (NASALIDE) 25 MCG/ACT (0.025%) SOLN Place 2 sprays into the nose 2 (two) times daily. Patient not taking: Reported on 07/20/2021 05/13/16   Fletcher Anon, MD  fluticasone (FLOVENT HFA) 110 MCG/ACT inhaler Inhale 2 puffs into the lungs 2 (two) times daily. Patient not taking: Reported on 07/20/2021 10/12/19   Hetty Blend, FNP  montelukast (SINGULAIR) 10 MG tablet Take 1 tablet (10 mg total) by mouth at bedtime. Patient not taking: Reported on 07/20/2021 10/12/19   Hetty Blend, FNP  risedronate (ACTONEL) 35 MG tablet TAKE 1 TAB BY MOUTH EVERY 7 DAYS WITH WATER ON EMPTY STOMACH, NOTHING BY MOUTH OR LIE DOWN FOR NEXT 30 MINUTES. Patient not taking: Reported on 03/06/2021 11/23/19   Zola Button, Grayling Congress, DO  VENTOLIN HFA 108 [phone] Base) MCG/ACT inhaler Inhale 2 puffs into the lungs every 4 (four) hours as needed for wheezing or shortness of breath. Patient not taking: Reported on 07/20/2021 10/12/19   Hetty Blend, FNP  Vitamin D, Ergocalciferol, (DRISDOL) 1.25 MG (50000 UT) CAPS capsule TAKE 1 CAPSULE (50,000 UNITS TOTAL) BY MOUTH EVERY 7 (SEVEN) DAYS. Patient not taking: Reported on  03/06/2021 09/10/18   Seabron Spates R, DO    Allergies    Contrast media [iodinated diagnostic agents], Isovue [iopamidol], Amoxicillin, Belviq xr [lorcaserin hcl er], Saxenda [liraglutide -weight management], and Penicillins  Review of Systems   Review of Systems  Constitutional:  Negative for chills and fever.  HENT:  Negative for congestion and rhinorrhea.   Eyes:  Negative for redness and visual disturbance.  Respiratory:  Negative for shortness of breath and wheezing.   Cardiovascular:  Negative for chest pain and palpitations.  Gastrointestinal:  Negative for nausea and vomiting.  Genitourinary:  Negative for dysuria and urgency.  Musculoskeletal:  Positive for arthralgias and myalgias.  Skin:  Negative for pallor and wound.  Neurological:  Negative for dizziness and headaches.   Physical Exam Updated Vital Signs BP Marland Kitchen)  155/91   Pulse (!) 102   Temp 97.8 F (36.6 C) (Oral)   Resp 18   Ht 5\' 7"  (1.702 m)   Wt 80.7 kg   SpO2 97%   BMI 27.88 kg/m   Physical Exam Vitals and nursing note reviewed.  Constitutional:      General: She is not in acute distress.    Appearance: She is well-developed. She is not diaphoretic.  HENT:     Head: Normocephalic and atraumatic.  Eyes:     Pupils: Pupils are equal, round, and reactive to light.  Cardiovascular:     Rate and Rhythm: Normal rate and regular rhythm.     Heart sounds: No murmur heard.   No friction rub. No gallop.  Pulmonary:     Effort: Pulmonary effort is normal.     Breath sounds: No wheezing or rales.  Abdominal:     General: There is no distension.     Palpations: Abdomen is soft.     Tenderness: There is no abdominal tenderness.  Musculoskeletal:        General: Swelling, tenderness and deformity present.     Cervical back: Normal range of motion and neck supple.     Comments: Pain and deformity to the left shoulder pain along the distal clavicle and AC joint.  Pain with range of motion of the shoulder  pain at the proximal humerus.  Pain at the left elbow with flexion extension especially along the distal radius.  Pain to the first and second metacarpal of the left hand.  Pulse motor and sensation intact.  Left ankle with posterior displacement pain to the medial and lateral malleolus.  Pain to the proximal fibula.  Pain to the navicular and the base of the fifth metacarpal bilaterally.  Pulse motor and sensation intact bilateral lower extremities.  No obvious pain with internal or external rotation of the hips.  No pelvic instability.  No pain with palpation of the abdomen or the anterior chest.  Does have some left posterior rib angle pain.  Some thoracic and L-spine pain worse on the left of midline.  Skin:    General: Skin is warm and dry.  Neurological:     Mental Status: She is alert and oriented to person, place, and time.  Psychiatric:        Behavior: Behavior normal.    ED Results / Procedures / Treatments   Labs (all labs ordered are listed, but only abnormal results are displayed) Labs Reviewed  CBC WITH DIFFERENTIAL/PLATELET - Abnormal; Notable for the following components:      Result Value   WBC 14.0 (*)    Neutro Abs 11.3 (*)    All other components within normal limits  BASIC METABOLIC PANEL - Abnormal; Notable for the following components:   Glucose, Bld 145 (*)    Calcium 8.8 (*)    All other components within normal limits  RESP PANEL BY RT-PCR (FLU A&B, COVID) [id number]    EKG None  Radiology DG Ribs Unilateral W/Chest Left  Result Date: 07/20/2021 CLINICAL DATA:  Left axillary rib pain extending into the anterior chest status post fall. EXAM: LEFT RIBS AND CHEST - 3+ VIEW COMPARISON:  Chest radiograph 06/21/2021 FINDINGS: Cardiomediastinal silhouette and pulmonary vasculature are within normal limits. The lungs are clear. No pneumothorax. Left proximal humerus fracture better seen on dedicated shoulder radiographs. No displaced left rib fractures identified.  IMPRESSION: 1. No displaced left rib fracture. 2. Comminuted, impacted left proximal humerus fracture.  Electronically Signed   By: Miachel Roux M.D.   On: 07/20/2021 13:09   DG Thoracic Spine 2 View  Result Date: 07/20/2021 CLINICAL DATA:  Pain status post fall. EXAM: THORACIC SPINE 2 VIEWS; LUMBAR SPINE - 2-3 VIEW COMPARISON:  Chest radiograph 06/21/2021 FINDINGS: Thoracic spine: Alignment within normal limits. Vertebral body heights are maintained. Minimal endplate spurring seen at multiple levels of the midthoracic spine. Lumbar spine: Alignment within normal limits. No significant vertebral body or disc height loss. Minimal endplate spurring seen at multiple levels. IMPRESSION: No acute radiographic abnormality of the thoracic or lumbar spine. Electronically Signed   By: Miachel Roux M.D.   On: 07/20/2021 13:19   DG Lumbar Spine 2-3 Views  Result Date: 07/20/2021 CLINICAL DATA:  Pain status post fall. EXAM: THORACIC SPINE 2 VIEWS; LUMBAR SPINE - 2-3 VIEW COMPARISON:  Chest radiograph 06/21/2021 FINDINGS: Thoracic spine: Alignment within normal limits. Vertebral body heights are maintained. Minimal endplate spurring seen at multiple levels of the midthoracic spine. Lumbar spine: Alignment within normal limits. No significant vertebral body or disc height loss. Minimal endplate spurring seen at multiple levels. IMPRESSION: No acute radiographic abnormality of the thoracic or lumbar spine. Electronically Signed   By: Miachel Roux M.D.   On: 07/20/2021 13:19   DG Elbow 2 Views Left  Result Date: 07/20/2021 CLINICAL DATA:  Left elbow pain status post fall. EXAM: LEFT ELBOW - 2 VIEW COMPARISON:  None. FINDINGS: Exam limited due to overlying artifact. No displaced fracture identified. Mild soft tissue swelling seen overlying the olecranon. IMPRESSION: Limited exam.  No displaced fracture identified. Electronically Signed   By: Miachel Roux M.D.   On: 07/20/2021 13:10   DG Ankle Complete Left  Result  Date: 07/20/2021 CLINICAL DATA:  Left ankle pain status post fall. EXAM: LEFT ANKLE COMPLETE - 3+ VIEW; LEFT FOOT - COMPLETE 3+ VIEW COMPARISON:  None. FINDINGS: Left ankle: Moderately displaced trimalleolar ankle fracture. There is disruption of the ankle mortise. Large ankle joint effusion. Enthesopathic changes are seen at the insertion of the plantar fascia and Achilles tendon on the calcaneus. Left foot: No fracture or dislocation. 3 x 1 mm radiopaque foreign body seen within the soft tissues along the plantar aspect of the forefoot. IMPRESSION: 1. Moderately displaced trimalleolar left ankle fracture. 2. 3 x 1 mm radiopaque foreign body seen within the soft tissues along the plantar aspect of the forefoot. Electronically Signed   By: Miachel Roux M.D.   On: 07/20/2021 13:24   DG Ankle Complete Right  Result Date: 07/20/2021 CLINICAL DATA:  Right ankle and foot pain status post fall EXAM: RIGHT ANKLE - COMPLETE 3+ VIEW; RIGHT FOOT COMPLETE - 3+ VIEW COMPARISON:  None. FINDINGS: Right ankle: Mild soft tissue swelling overlying the lateral malleolus. There is a subtle lucency within the lateral malleolus best seen on the oblique view which may be a prominent vascular channel versus nondisplaced fracture. Right foot: Mild hallux valgus deformity. Minimally displaced fracture of the base of the fifth metatarsal. Enthesopathic changes are seen at the insertion of the plantar fascia and Achilles tendon on the calcaneus. Nonspecific soft tissue calcifications noted adjacent to the distal third metatarsal. This may be sequelae of remote trauma or represent external artifact. IMPRESSION: 1. Minimally displaced fracture of the base of the fifth metatarsal. 2. Findings suspicious for nondisplaced fracture of the lateral malleolus. Electronically Signed   By: Miachel Roux M.D.   On: 07/20/2021 13:16   DG Shoulder Left  Result Date: 07/20/2021  CLINICAL DATA:  Fall.  Left shoulder deformity and pain. EXAM: LEFT  SHOULDER - 2+ VIEW COMPARISON:  None. FINDINGS: Comminuted, impacted fracture of the left proximal humerus. Visualized soft tissues are unremarkable. IMPRESSION: Comminuted, impacted left proximal humerus fracture. Electronically Signed   By: Miachel Roux M.D.   On: 07/20/2021 13:06   DG Hand Complete Left  Result Date: 07/20/2021 CLINICAL DATA:  Left hand pain status post fall. EXAM: LEFT HAND - COMPLETE 3+ VIEW COMPARISON:  None. FINDINGS: Deformity of the scaphoid most likely sequelae of prior trauma. Otherwise no acute fracture or dislocation. Mild degenerative changes seen at the first carpometacarpal joint. IMPRESSION: Deformity of the scaphoid most likely sequelae of remote trauma. If the patient has snuffbox tenderness, further evaluation with dedicated wrist radiographs should be obtained. Electronically Signed   By: Miachel Roux M.D.   On: 07/20/2021 13:12   DG Foot Complete Left  Result Date: 07/20/2021 CLINICAL DATA:  Left ankle pain status post fall. EXAM: LEFT ANKLE COMPLETE - 3+ VIEW; LEFT FOOT - COMPLETE 3+ VIEW COMPARISON:  None. FINDINGS: Left ankle: Moderately displaced trimalleolar ankle fracture. There is disruption of the ankle mortise. Large ankle joint effusion. Enthesopathic changes are seen at the insertion of the plantar fascia and Achilles tendon on the calcaneus. Left foot: No fracture or dislocation. 3 x 1 mm radiopaque foreign body seen within the soft tissues along the plantar aspect of the forefoot. IMPRESSION: 1. Moderately displaced trimalleolar left ankle fracture. 2. 3 x 1 mm radiopaque foreign body seen within the soft tissues along the plantar aspect of the forefoot. Electronically Signed   By: Miachel Roux M.D.   On: 07/20/2021 13:24   DG Foot Complete Right  Result Date: 07/20/2021 CLINICAL DATA:  Right ankle and foot pain status post fall EXAM: RIGHT ANKLE - COMPLETE 3+ VIEW; RIGHT FOOT COMPLETE - 3+ VIEW COMPARISON:  None. FINDINGS: Right ankle: Mild soft  tissue swelling overlying the lateral malleolus. There is a subtle lucency within the lateral malleolus best seen on the oblique view which may be a prominent vascular channel versus nondisplaced fracture. Right foot: Mild hallux valgus deformity. Minimally displaced fracture of the base of the fifth metatarsal. Enthesopathic changes are seen at the insertion of the plantar fascia and Achilles tendon on the calcaneus. Nonspecific soft tissue calcifications noted adjacent to the distal third metatarsal. This may be sequelae of remote trauma or represent external artifact. IMPRESSION: 1. Minimally displaced fracture of the base of the fifth metatarsal. 2. Findings suspicious for nondisplaced fracture of the lateral malleolus. Electronically Signed   By: Miachel Roux M.D.   On: 07/20/2021 13:16   DG Knee 3 Views Left  Result Date: 07/20/2021 CLINICAL DATA:  Pain status post fall EXAM: LEFT KNEE - 3 VIEW COMPARISON:  None. FINDINGS: Minimally depressed fracture of the posterior lateral tibial plateau. No additional fracture or dislocation. Atherosclerotic changes seen throughout visualized arterial segments. IMPRESSION: Minimally depressed fracture of the posterolateral tibial plateau. Electronically Signed   By: Miachel Roux M.D.   On: 07/20/2021 13:20    Procedures Reduction of dislocation  Date/Time: 07/20/2021 2:35 PM Performed by: Deno Etienne, DO Authorized by: Deno Etienne, DO  Consent: Verbal consent obtained. Risks and benefits: risks, benefits and alternatives were discussed Consent given by: patient Patient understanding: patient states understanding of the procedure being performed Patient consent: the patient's understanding of the procedure matches consent given Procedure consent: procedure consent matches procedure scheduled Relevant documents: relevant documents present and verified  Imaging studies: imaging studies available Patient identity confirmed: verbally with patient Time out:  Immediately prior to procedure a "time out" was called to verify the correct patient, procedure, equipment, support staff and site/side marked as required. Preparation: Patient was prepped and draped in the usual sterile fashion. Local anesthesia used: yes Anesthesia: hematoma block  Anesthesia: Local anesthesia used: yes Local Anesthetic: lidocaine 2% with epinephrine Anesthetic total: 10 mL     Medications Ordered in ED Medications  HYDROmorphone (DILAUDID) injection 0.5 mg (0.5 mg Intravenous Given 07/20/21 1128)  ondansetron (ZOFRAN) injection 4 mg (4 mg Intravenous Given 07/20/21 1129)  HYDROmorphone (DILAUDID) injection 0.5 mg (0.5 mg Intravenous Given 07/20/21 1337)  ondansetron (ZOFRAN) injection 4 mg (4 mg Intravenous Given 07/20/21 1337)  lidocaine-EPINEPHrine (XYLOCAINE W/EPI) 2 %-1:200000 (PF) injection 10 mL (10 mLs Intradermal Given 07/20/21 1422)  fentaNYL (SUBLIMAZE) injection 150 mcg (150 mcg Intravenous Given 07/20/21 1422)    ED Course  I have reviewed the triage vital signs and the nursing notes.  Pertinent labs & imaging results that were available during my care of the patient were reviewed by me and considered in my medical decision making (see chart for details).    MDM Rules/Calculators/A&P                           64 yo F with a chief complaints of left-sided chest pain after a fall.  Nonsyncopal by history.  Complaining mostly of pain to the left shoulder and left ankle.  Will obtain plain films treat pain blood work COVID test reassess.  Patient found to have bilateral ankle fractures to the left 1 with posterior lateral displacement as viewed by me.  Also with bilateral base of the fifth metatarsal fractures.  She has a left tibial plateau fracture and a left proximal humerus fracture as viewed by me.  I discussed this with orthopedic surgeon on-call, Dr. Zachery Dakins, recommended attempting reduction of the ankle placing in a short leg posterior and  stirrup splint and into a knee immobilizer.  CAM Walker for the right lower extremity and left shoulder sling.  As the patient is not ambulatory will discuss with hospitalist for admission.  The patients results and plan were reviewed and discussed.   Any x-rays performed were independently reviewed by myself.   Differential diagnosis were considered with the presenting HPI.  Medications  HYDROmorphone (DILAUDID) injection 0.5 mg (0.5 mg Intravenous Given 07/20/21 1128)  ondansetron (ZOFRAN) injection 4 mg (4 mg Intravenous Given 07/20/21 1129)  HYDROmorphone (DILAUDID) injection 0.5 mg (0.5 mg Intravenous Given 07/20/21 1337)  ondansetron (ZOFRAN) injection 4 mg (4 mg Intravenous Given 07/20/21 1337)  lidocaine-EPINEPHrine (XYLOCAINE W/EPI) 2 %-1:200000 (PF) injection 10 mL (10 mLs Intradermal Given 07/20/21 1422)  fentaNYL (SUBLIMAZE) injection 150 mcg (150 mcg Intravenous Given 07/20/21 1422)    Vitals:   07/20/21 1034 07/20/21 1036 07/20/21 1130 07/20/21 1337  BP:  (!) 157/85 (!) 151/76 (!) 155/91  Pulse:  (!) 106 (!) 107 (!) 102  Resp:  18 18 18   Temp:  97.8 F (36.6 C)    TempSrc:  Oral    SpO2:  91% 93% 97%  Weight: 80.7 kg     Height: 5\' 7"  (1.702 m)       Final diagnoses:  Closed fracture of left tibial plateau, initial encounter  Closed fracture of both ankles, initial encounter  Nondisplaced fracture of fifth metatarsal bone, left foot, initial encounter for closed fracture  Other closed displaced fracture of proximal end of left humerus, initial encounter    Admission/ observation were discussed with the admitting physician, patient and/or family and they are comfortable with the plan.   Final Clinical Impression(s) / ED Diagnoses Final diagnoses:  Closed fracture of left tibial plateau, initial encounter  Closed fracture of both ankles, initial encounter  Nondisplaced fracture of fifth metatarsal bone, left foot, initial encounter for closed fracture  Other  closed displaced fracture of proximal end of left humerus, initial encounter    Rx / DC Orders ED Discharge Orders     None        Deno Etienne, DO 07/20/21 1505

## 2021-07-20 NOTE — ED Triage Notes (Signed)
Pt to ER via EMS form home states she tripped and fell landing on left side.  Pt with pain to left ankle and left shoulder.  Deformity of both noted.  PMS intact.

## 2021-07-21 ENCOUNTER — Inpatient Hospital Stay (HOSPITAL_COMMUNITY): Payer: BC Managed Care – PPO

## 2021-07-21 DIAGNOSIS — S92354A Nondisplaced fracture of fifth metatarsal bone, right foot, initial encounter for closed fracture: Secondary | ICD-10-CM

## 2021-07-21 DIAGNOSIS — S42309A Unspecified fracture of shaft of humerus, unspecified arm, initial encounter for closed fracture: Secondary | ICD-10-CM | POA: Diagnosis present

## 2021-07-21 DIAGNOSIS — S82852A Displaced trimalleolar fracture of left lower leg, initial encounter for closed fracture: Secondary | ICD-10-CM

## 2021-07-21 DIAGNOSIS — S82142A Displaced bicondylar fracture of left tibia, initial encounter for closed fracture: Secondary | ICD-10-CM | POA: Diagnosis present

## 2021-07-21 DIAGNOSIS — S8264XA Nondisplaced fracture of lateral malleolus of right fibula, initial encounter for closed fracture: Secondary | ICD-10-CM | POA: Diagnosis present

## 2021-07-21 HISTORY — DX: Displaced bicondylar fracture of left tibia, initial encounter for closed fracture: S82.142A

## 2021-07-21 HISTORY — DX: Displaced trimalleolar fracture of left lower leg, initial encounter for closed fracture: S82.852A

## 2021-07-21 HISTORY — DX: Nondisplaced fracture of lateral malleolus of right fibula, initial encounter for closed fracture: S82.64XA

## 2021-07-21 HISTORY — DX: Unspecified fracture of shaft of humerus, unspecified arm, initial encounter for closed fracture: S42.309A

## 2021-07-21 HISTORY — DX: Nondisplaced fracture of fifth metatarsal bone, right foot, initial encounter for closed fracture: S92.354A

## 2021-07-21 LAB — CBC
HCT: 40.2 % (ref 36.0–46.0)
Hemoglobin: 13.2 g/dL (ref 12.0–15.0)
MCH: 30.8 pg (ref 26.0–34.0)
MCHC: 32.8 g/dL (ref 30.0–36.0)
MCV: 93.9 fL (ref 80.0–100.0)
Platelets: 190 10*3/uL (ref 150–400)
RBC: 4.28 MIL/uL (ref 3.87–5.11)
RDW: 12.9 % (ref 11.5–15.5)
WBC: 11 10*3/uL — ABNORMAL HIGH (ref 4.0–10.5)
nRBC: 0 % (ref 0.0–0.2)

## 2021-07-21 LAB — COMPREHENSIVE METABOLIC PANEL
ALT: 22 U/L (ref 0–44)
AST: 27 U/L (ref 15–41)
Albumin: 3.9 g/dL (ref 3.5–5.0)
Alkaline Phosphatase: 67 U/L (ref 38–126)
Anion gap: 9 (ref 5–15)
BUN: 12 mg/dL (ref 8–23)
CO2: 22 mmol/L (ref 22–32)
Calcium: 8.6 mg/dL — ABNORMAL LOW (ref 8.9–10.3)
Chloride: 104 mmol/L (ref 98–111)
Creatinine, Ser: 0.93 mg/dL (ref 0.44–1.00)
GFR, Estimated: >60 mL/min (ref >60)
Glucose, Bld: 125 mg/dL — ABNORMAL HIGH (ref 70–99)
Potassium: 4.3 mmol/L (ref 3.5–5.1)
Sodium: 135 mmol/L (ref 135–145)
Total Bilirubin: 0.8 mg/dL (ref 0.3–1.2)
Total Protein: 6.6 g/dL (ref 6.5–8.1)

## 2021-07-21 LAB — HIV ANTIBODY (ROUTINE TESTING W REFLEX): HIV Screen 4th Generation wRfx: NONREACTIVE

## 2021-07-21 MED ORDER — MORPHINE SULFATE (PF) 2 MG/ML IV SOLN
2.0000 mg | INTRAVENOUS | Status: AC
  Administered 2021-07-21: 2 mg via INTRAVENOUS
  Filled 2021-07-21: qty 1

## 2021-07-21 MED ORDER — DIAZEPAM 5 MG PO TABS
5.0000 mg | ORAL_TABLET | Freq: Four times a day (QID) | ORAL | Status: DC | PRN
  Administered 2021-07-21 – 2021-07-26 (×5): 5 mg via ORAL
  Filled 2021-07-21 (×5): qty 1

## 2021-07-21 MED ORDER — OXYCODONE HCL 5 MG PO TABS
5.0000 mg | ORAL_TABLET | Freq: Four times a day (QID) | ORAL | Status: DC | PRN
Start: 2021-07-21 — End: 2021-07-22
  Administered 2021-07-21 – 2021-07-22 (×3): 10 mg via ORAL
  Filled 2021-07-21 (×3): qty 2

## 2021-07-21 NOTE — Progress Notes (Addendum)
PROGRESS NOTE    Kathryn Boyer  ZOX:[id number] DOB: 01-Feb-1956 DOA: 07/20/2021 PCP: Donato Schultz, DO    Brief Narrative:  Kathryn Boyer is a 65 year old female with past medical history significant for hypothyroidism, essential hypertension, GERD, hyperlipidemia,  who presented to [facility] ED on 11/25 via EMS with complaint of left ankle and shoulder pain following fall.  Patient reports she was walking out of her house and tripped over some carpet cleaning hoses and fell onto her left side.  Denies any loss of conscious or head injury.  Patient was unable to mobilize following the fall and EMS was activated.  In the ED, multiple imaging studies notable for left humerus fracture, left tibial plateau fracture, right foot fifth metatarsal and lateral malleolus fracture, left foot trimalleolar ankle fracture, and left scaphoid deformity.  Orthopedics was consulted.  Hospitalist service consulted for further evaluation and management of multiple fractures and likely need of surgical intervention.   Assessment & Plan:   Principal Problem:   Left trimalleolar fracture, closed, initial encounter Active Problems:   Adjustment disorder with anxiety   Generalized anxiety disorder   Hyperlipidemia LDL goal <100   Fall   Humerus fracture   Tibial plateau fracture, left   Nondisplaced fracture of fifth metatarsal bone, right foot, initial encounter for closed fracture   Nondisplaced fracture of lateral malleolus of right fibula, initial encounter for closed fracture   Left trimalleolar fracture, closed, initial encounter Left Humerus fracture Tibial plateau fracture, left Nondisplaced fracture of fifth metatarsal bone, right foot, initial encounter for closed fracture Nondisplaced fracture of lateral malleolus of right fibula, initial encounter for closed fracture Patient presenting to ED following mechanical fall at home.  No loss of consciousness without head trauma.  Patient sustained  multiple traumatic injuries including left trimalleolar fracture, left humerus fracture, left tibial plateau fracture, right fifth metatarsal fracture, and right lateral malleolus fracture.  Seen by orthopedics and recommends transfer to [facility] for planned surgical invention on Monday, 07/23/2021 with Dr. Carola Frost. --Left shoulder, NWB with sling --Right lower extremity, NWB with cam boot in place --Left lower extremity, NWB with short splint in place --Oxycodone 5-10 mg p.o. every 6 hours as needed moderate pain --Morphine 2 mg IV every 4 hours as needed severe pain --Surgical invention planned on 07/23/2021 --Hold PT/OT evaluation until postop  HLD: Pravastatin 20 mg p.o. daily  Hypothyroidism: Levothyroxine 100 mcg p.o. daily  Essential hypertension On olmesartan 40 mg p.o. daily at home.  BP currently well controlled, 120/67 this AM. --Hold home antihypertensives for now --Continue monitor blood pressure  Generalized anxiety disorder --Valium 5 mg p.o. q6h PRN    DVT prophylaxis: enoxaparin (LOVENOX) injection 40 mg Start: 07/20/21 2000   Code Status: Full Code Family Communication: Family present at bedside this morning  Disposition Plan:  Level of care: Med-Surg Status is: Inpatient  Remains inpatient appropriate because: Transferring to [facility] for surgical intervention planned on Monday; requiring IV pain control    Consultants:  [facility], Dr. Blanchie Dessert  Procedures:  None  Antimicrobials:  None   Subjective: Patient seen examined at bedside, resting comfortably.  Pain is currently controlled at rest, but is concerned about upcoming CT scan and requesting another dose of IV morphine.  Patient is frustrated regarding her fall with multiple injuries.  Also wants to make sure that Dr. Carola Frost talks to her before having surgery on Monday, especially regarding her shoulder as she would like to manage this conservatively without surgery if possible.  No other  questions or concerns at this time.  Denies headache, no visual changes, no chest pain, no shortness of breath, no abdominal pain, no fever/chills/night sweats, no nausea cefonicid diarrhea.  No acute concerns overnight per nursing staff.   Objective: Vitals:   07/20/21 1530 07/20/21 1658 07/20/21 2113 07/21/21 0621  BP: 118/74 (!) 145/89 114/71 120/67  Pulse: (!) 106 (!) 106 96 88  Resp: Temp:  97.7 F (36.5 C) 98.4 F (36.9 C) 98.2 F (36.8 C)  TempSrc:  Oral Oral Oral  SpO2: 93% 95% 95% 91%  Weight:      Height:        Intake/Output Summary (Last 24 hours) at 07/21/2021 1134 Last data filed at 07/21/2021 1000 Gross per 24 hour  Intake 741.25 ml  Output 400 ml  Net 341.25 ml   Filed Weights   07/20/21 1034  Weight: 80.7 kg    Examination:  General exam: Appears calm and comfortable  Respiratory system: Clear to auscultation. Respiratory effort normal.  On room air Cardiovascular system: S1 & S2 heard, RRR. No JVD, murmurs, rubs, gallops or clicks. No pedal edema. Gastrointestinal system: Abdomen is nondistended, soft and nontender. No organomegaly or masses felt. Normal bowel sounds heard. Central nervous system: Alert and oriented. No focal neurological deficits. Extremities: Left upper extremity with sling in place, right lower extremity with Cam boot in place, left lower extremity with splint in place, neurovascular intact. Skin: No rashes, lesions or ulcers Psychiatry: Judgement and insight appear normal. Mood & affect appropriate.     Data Reviewed: I have personally reviewed following labs and imaging studies  CBC: Recent Labs  Lab 07/20/21 1131 07/21/21 0323  WBC 14.0* 11.0*  NEUTROABS 11.3*  --   HGB 14.7 13.2  HCT 43.4 40.2  MCV 91.2 93.9  PLT 239 190   Basic Metabolic Panel: Recent Labs  Lab 07/20/21 1131 07/21/21 0323  NA 136 135  K 3.9 4.3  CL 105 104  CO2 22 22  GLUCOSE 145* 125*  BUN 12 12  CREATININE 0.86 0.93   CALCIUM 8.8* 8.6*   GFR: Estimated Creatinine Clearance: 65 mL/min (by C-G formula based on SCr of 0.93 mg/dL). Liver Function Tests: Recent Labs  Lab 07/21/21 0323  AST 27  ALT 22  ALKPHOS 67  BILITOT 0.8  PROT 6.6  ALBUMIN 3.9   No results for input(s): LIPASE, AMYLASE in the last 168 hours. No results for input(s): AMMONIA in the last 168 hours. Coagulation Profile: No results for input(s): INR, PROTIME in the last 168 hours. Cardiac Enzymes: No results for input(s): CKTOTAL, CKMB, CKMBINDEX, TROPONINI in the last 168 hours. BNP (last 3 results) No results for input(s): PROBNP in the last 8760 hours. HbA1C: No results for input(s): HGBA1C in the last 72 hours. CBG: No results for input(s): GLUCAP in the last 168 hours. Lipid Profile: No results for input(s): CHOL, HDL, LDLCALC, TRIG, CHOLHDL, LDLDIRECT in the last 72 hours. Thyroid Function Tests: No results for input(s): TSH, T4TOTAL, FREET4, T3FREE, THYROIDAB in the last 72 hours. Anemia Panel: No results for input(s): VITAMINB12, FOLATE, FERRITIN, TIBC, IRON, RETICCTPCT in the last 72 hours. Sepsis Labs: No results for input(s): PROCALCITON, LATICACIDVEN in the last 168 hours.  Recent Results (from the past 240 hour(s))  Resp Panel by RT-PCR (Flu A&B, Covid) Nasopharyngeal Swab     Status: None   Collection Time: 07/20/21 11:31 AM   Specimen: Nasopharyngeal Swab; Nasopharyngeal(NP) swabs in vial  transport medium  Result Value Ref Range Status   SARS Coronavirus 2 by RT PCR NEGATIVE NEGATIVE Final    Comment: (NOTE) SARS-CoV-2 target nucleic acids are NOT DETECTED.  The SARS-CoV-2 RNA is generally detectable in upper respiratory specimens during the acute phase of infection. The lowest concentration of SARS-CoV-2 viral copies this assay can detect is 138 copies/mL. A negative result does not preclude SARS-Cov-2 infection and should not be used as the sole basis for treatment or other patient management  decisions. A negative result may occur with  improper specimen collection/handling, submission of specimen other than nasopharyngeal swab, presence of viral mutation(s) within the areas targeted by this assay, and inadequate number of viral copies(<138 copies/mL). A negative result must be combined with clinical observations, patient history, and epidemiological information. The expected result is Negative.  Fact Sheet for Patients:  BloggerCourse.com  Fact Sheet for Healthcare Providers:  SeriousBroker.it  This test is no t yet approved or cleared by the [place] FDA and  has been authorized for detection and/or diagnosis of SARS-CoV-2 by FDA under an Emergency Use Authorization (EUA). This EUA will remain  in effect (meaning this test can be used) for the duration of the COVID-19 declaration under Section 564(b)(1) of the Act, 21 U.S.C.section 360bbb-3(b)(1), unless the authorization is terminated  or revoked sooner.       Influenza A by PCR NEGATIVE NEGATIVE Final   Influenza B by PCR NEGATIVE NEGATIVE Final    Comment: (NOTE) The Xpert Xpress SARS-CoV-2/FLU/RSV plus assay is intended as an aid in the diagnosis of influenza from Nasopharyngeal swab specimens and should not be used as a sole basis for treatment. Nasal washings and aspirates are unacceptable for Xpert Xpress SARS-CoV-2/FLU/RSV testing.  Fact Sheet for Patients: BloggerCourse.com  Fact Sheet for Healthcare Providers: SeriousBroker.it  This test is not yet approved or cleared by the [place] FDA and has been authorized for detection and/or diagnosis of SARS-CoV-2 by FDA under an Emergency Use Authorization (EUA). This EUA will remain in effect (meaning this test can be used) for the duration of the COVID-19 declaration under Section 564(b)(1) of the Act, 21 U.S.C. section 360bbb-3(b)(1), unless the  authorization is terminated or revoked.  Performed at [facility], [address], [place], [place] [address]          Radiology Studies: DG Ribs Unilateral W/Chest Left  Result Date: 07/20/2021 CLINICAL DATA:  Left axillary rib pain extending into the anterior chest status post fall. EXAM: LEFT RIBS AND CHEST - 3+ VIEW COMPARISON:  Chest radiograph 06/21/2021 FINDINGS: Cardiomediastinal silhouette and pulmonary vasculature are within normal limits. The lungs are clear. No pneumothorax. Left proximal humerus fracture better seen on dedicated shoulder radiographs. No displaced left rib fractures identified. IMPRESSION: 1. No displaced left rib fracture. 2. Comminuted, impacted left proximal humerus fracture. Electronically Signed   By: Acquanetta Belling M.D.   On: 07/20/2021 13:09   DG Thoracic Spine 2 View  Result Date: 07/20/2021 CLINICAL DATA:  Pain status post fall. EXAM: THORACIC SPINE 2 VIEWS; LUMBAR SPINE - 2-3 VIEW COMPARISON:  Chest radiograph 06/21/2021 FINDINGS: Thoracic spine: Alignment within normal limits. Vertebral body heights are maintained. Minimal endplate spurring seen at multiple levels of the midthoracic spine. Lumbar spine: Alignment within normal limits. No significant vertebral body or disc height loss. Minimal endplate spurring seen at multiple levels. IMPRESSION: No acute radiographic abnormality of the thoracic or lumbar spine. Electronically Signed   By: Harrington Challenger.D.  On: 07/20/2021 13:19   DG Lumbar Spine 2-3 Views  Result Date: 07/20/2021 CLINICAL DATA:  Pain status post fall. EXAM: THORACIC SPINE 2 VIEWS; LUMBAR SPINE - 2-3 VIEW COMPARISON:  Chest radiograph 06/21/2021 FINDINGS: Thoracic spine: Alignment within normal limits. Vertebral body heights are maintained. Minimal endplate spurring seen at multiple levels of the midthoracic spine. Lumbar spine: Alignment within normal limits. No significant vertebral body or disc height loss.  Minimal endplate spurring seen at multiple levels. IMPRESSION: No acute radiographic abnormality of the thoracic or lumbar spine. Electronically Signed   By: Acquanetta Belling M.D.   On: 07/20/2021 13:19   DG Elbow 2 Views Left  Result Date: 07/20/2021 CLINICAL DATA:  Left elbow pain status post fall. EXAM: LEFT ELBOW - 2 VIEW COMPARISON:  None. FINDINGS: Exam limited due to overlying artifact. No displaced fracture identified. Mild soft tissue swelling seen overlying the olecranon. IMPRESSION: Limited exam.  No displaced fracture identified. Electronically Signed   By: Acquanetta Belling M.D.   On: 07/20/2021 13:10   DG Ankle 2 Views Left  Result Date: 07/20/2021 CLINICAL DATA:  65 year old female with history of trimalleolar left ankle fracture. EXAM: LEFT ANKLE - 2 VIEW COMPARISON:  Earlier the same day FINDINGS: Improved alignment of previously visualized acute trimalleolar fracture with persistent mild lateral displacement of the talus status post casting. IMPRESSION: Improved alignment of previously visualized acute trimalleolar fracture status post casting. Electronically Signed   By: Marliss Coots M.D.   On: 07/20/2021 15:28   DG Ankle Complete Left  Result Date: 07/20/2021 CLINICAL DATA:  Left ankle pain status post fall. EXAM: LEFT ANKLE COMPLETE - 3+ VIEW; LEFT FOOT - COMPLETE 3+ VIEW COMPARISON:  None. FINDINGS: Left ankle: Moderately displaced trimalleolar ankle fracture. There is disruption of the ankle mortise. Large ankle joint effusion. Enthesopathic changes are seen at the insertion of the plantar fascia and Achilles tendon on the calcaneus. Left foot: No fracture or dislocation. 3 x 1 mm radiopaque foreign body seen within the soft tissues along the plantar aspect of the forefoot. IMPRESSION: 1. Moderately displaced trimalleolar left ankle fracture. 2. 3 x 1 mm radiopaque foreign body seen within the soft tissues along the plantar aspect of the forefoot. Electronically Signed   By: Acquanetta Belling M.D.   On: 07/20/2021 13:24   DG Ankle Complete Right  Result Date: 07/20/2021 CLINICAL DATA:  Right ankle and foot pain status post fall EXAM: RIGHT ANKLE - COMPLETE 3+ VIEW; RIGHT FOOT COMPLETE - 3+ VIEW COMPARISON:  None. FINDINGS: Right ankle: Mild soft tissue swelling overlying the lateral malleolus. There is a subtle lucency within the lateral malleolus best seen on the oblique view which may be a prominent vascular channel versus nondisplaced fracture. Right foot: Mild hallux valgus deformity. Minimally displaced fracture of the base of the fifth metatarsal. Enthesopathic changes are seen at the insertion of the plantar fascia and Achilles tendon on the calcaneus. Nonspecific soft tissue calcifications noted adjacent to the distal third metatarsal. This may be sequelae of remote trauma or represent external artifact. IMPRESSION: 1. Minimally displaced fracture of the base of the fifth metatarsal. 2. Findings suspicious for nondisplaced fracture of the lateral malleolus. Electronically Signed   By: Acquanetta Belling M.D.   On: 07/20/2021 13:16   CT Knee Left Wo Contrast  Result Date: 07/20/2021 CLINICAL DATA:  Knee pain status post fall. Tibial plateau fracture. EXAM: CT OF THE LEFT KNEE WITHOUT CONTRAST TECHNIQUE: Multidetector CT imaging of the left knee was performed  according to the standard protocol. Multiplanar CT image reconstructions were also generated. COMPARISON:  Radiographs same date FINDINGS: Bones/Joint/Cartilage As demonstrated on the earlier radiographs, there is a mildly depressed articular intra-articular fracture of the medial tibial plateau posteriorly. There is 3 mm of depression of the articular surface, best seen on the sagittal images. The tibial spines are intact. There is no involvement of the lateral tibial plateau. The distal femur, patella and proximal fibula are intact. A small lipohemarthrosis is noted. Ligaments Suboptimally assessed by CT. Muscles and Tendons The  extensor mechanism is intact. No focal muscular abnormality identified. Soft tissues No evidence of foreign body, fluid collection or soft tissue emphysema. IMPRESSION: 1. Mildly depressed intra-articular fracture of the medial tibial plateau posteriorly. 2. Associated lipohemarthrosis. 3. Intact lateral tibial plateau and distal femur. Electronically Signed   By: Carey Bullocks M.D.   On: 07/20/2021 16:39   CT SHOULDER LEFT WO CONTRAST  Result Date: 07/21/2021 CLINICAL DATA:  Fall yesterday. Left humeral neck fracture. Preoperative planning. EXAM: CT OF THE UPPER LEFT EXTREMITY WITHOUT CONTRAST TECHNIQUE: Multidetector CT imaging of the left shoulder was performed according to the standard protocol. COMPARISON:  Radiographs 07/20/2021 FINDINGS: Bones/Joint/Cartilage The bones are demineralized. Comminuted fracture of the left humeral neck demonstrates impaction, apex anterior angulation and up to 6 mm of anterior displacement. This fracture involves both tuberosities as well as the superior articular surface of the humeral head. The humeral head remains located. No evidence of glenoid or other scapular fracture. There is a moderate size hemarthrosis. Mild acromioclavicular degenerative changes are present. Ligaments Suboptimally assessed by CT. Muscles and Tendons Grossly intact rotator cuff without evidence of focal muscular atrophy. There is some edema anteriorly in the deltoid muscle without evidence of focal hematoma. Soft tissues Subcutaneous edema anteriorly and laterally consistent with contusion. No evidence of foreign body or soft tissue emphysema. Mild left lung emphysematous changes are noted. IMPRESSION: 1. Comminuted and displaced fracture of the left humeral neck as described. This fracture involves both tuberosities as well as the superior articular surface of the humeral head. 2. No evidence of glenoid or other scapular fracture. 3. Anterior soft tissue contusion without evidence of focal  hematoma. Electronically Signed   By: Carey Bullocks M.D.   On: 07/21/2021 10:06   CT ANKLE LEFT WO CONTRAST  Result Date: 07/20/2021 CLINICAL DATA:  Ankle fracture post reduction. Preoperative planning. EXAM: CT OF THE LEFT ANKLE WITHOUT CONTRAST TECHNIQUE: Multidetector CT imaging of the left ankle was performed according to the standard protocol. Multiplanar CT image reconstructions were also generated. COMPARISON:  Radiographs earlier the same date. FINDINGS: Bones/Joint/Cartilage The ankle is casted. Trimalleolar fracture demonstrates improved alignment compared with the original radiographs. Nearly transverse fracture through the base of the medial malleolus remains minimally displaced laterally. Comminuted fracture of the posterior malleolus demonstrates up to 11 mm of posterior displacement of the articular surface as well as depression of the articular surface by 5 mm. There is a comminuted fracture of the distal fibula with an anteriorly displaced butterfly fragment. No widening of the ankle mortise. The talus is located. The talar dome is intact. No definite tarsal bone fractures are identified. Multicentric os peroneum and accessory navicular noted. Ligaments Suboptimally assessed by CT. Muscles and Tendons The ankle tendons appear intact. The posterior tibialis and flexor digitorum longus tendons are in close proximity to the fracture of the posterior malleolus, but no tendon entrapment identified Soft tissues No foreign body, focal fluid collection or soft tissue emphysema. There  is moderate lateral soft tissue swelling. IMPRESSION: 1. Mild residual displacement of the trimalleolar fracture, improved from the original radiographs. 2. No evidence of tarsal bone fracture. 3. The ankle tendons appear intact without entrapment. Electronically Signed   By: Carey Bullocks M.D.   On: 07/20/2021 17:19   DG Shoulder Left  Result Date: 07/20/2021 CLINICAL DATA:  Fall.  Left shoulder deformity and  pain. EXAM: LEFT SHOULDER - 2+ VIEW COMPARISON:  None. FINDINGS: Comminuted, impacted fracture of the left proximal humerus. Visualized soft tissues are unremarkable. IMPRESSION: Comminuted, impacted left proximal humerus fracture. Electronically Signed   By: Acquanetta Belling M.D.   On: 07/20/2021 13:06   DG Hand Complete Left  Result Date: 07/20/2021 CLINICAL DATA:  Left hand pain status post fall. EXAM: LEFT HAND - COMPLETE 3+ VIEW COMPARISON:  None. FINDINGS: Deformity of the scaphoid most likely sequelae of prior trauma. Otherwise no acute fracture or dislocation. Mild degenerative changes seen at the first carpometacarpal joint. IMPRESSION: Deformity of the scaphoid most likely sequelae of remote trauma. If the patient has snuffbox tenderness, further evaluation with dedicated wrist radiographs should be obtained. Electronically Signed   By: Acquanetta Belling M.D.   On: 07/20/2021 13:12   DG Foot Complete Left  Result Date: 07/20/2021 CLINICAL DATA:  Left ankle pain status post fall. EXAM: LEFT ANKLE COMPLETE - 3+ VIEW; LEFT FOOT - COMPLETE 3+ VIEW COMPARISON:  None. FINDINGS: Left ankle: Moderately displaced trimalleolar ankle fracture. There is disruption of the ankle mortise. Large ankle joint effusion. Enthesopathic changes are seen at the insertion of the plantar fascia and Achilles tendon on the calcaneus. Left foot: No fracture or dislocation. 3 x 1 mm radiopaque foreign body seen within the soft tissues along the plantar aspect of the forefoot. IMPRESSION: 1. Moderately displaced trimalleolar left ankle fracture. 2. 3 x 1 mm radiopaque foreign body seen within the soft tissues along the plantar aspect of the forefoot. Electronically Signed   By: Acquanetta Belling M.D.   On: 07/20/2021 13:24   DG Foot Complete Right  Result Date: 07/20/2021 CLINICAL DATA:  Right ankle and foot pain status post fall EXAM: RIGHT ANKLE - COMPLETE 3+ VIEW; RIGHT FOOT COMPLETE - 3+ VIEW COMPARISON:  None. FINDINGS: Right  ankle: Mild soft tissue swelling overlying the lateral malleolus. There is a subtle lucency within the lateral malleolus best seen on the oblique view which may be a prominent vascular channel versus nondisplaced fracture. Right foot: Mild hallux valgus deformity. Minimally displaced fracture of the base of the fifth metatarsal. Enthesopathic changes are seen at the insertion of the plantar fascia and Achilles tendon on the calcaneus. Nonspecific soft tissue calcifications noted adjacent to the distal third metatarsal. This may be sequelae of remote trauma or represent external artifact. IMPRESSION: 1. Minimally displaced fracture of the base of the fifth metatarsal. 2. Findings suspicious for nondisplaced fracture of the lateral malleolus. Electronically Signed   By: Acquanetta Belling M.D.   On: 07/20/2021 13:16   DG Knee 3 Views Left  Result Date: 07/20/2021 CLINICAL DATA:  Pain status post fall EXAM: LEFT KNEE - 3 VIEW COMPARISON:  None. FINDINGS: Minimally depressed fracture of the posterior lateral tibial plateau. No additional fracture or dislocation. Atherosclerotic changes seen throughout visualized arterial segments. IMPRESSION: Minimally depressed fracture of the posterolateral tibial plateau. Electronically Signed   By: Acquanetta Belling M.D.   On: 07/20/2021 13:20        Scheduled Meds:  enoxaparin (LOVENOX) injection  40 mg Subcutaneous  Q24H   levothyroxine  100 mcg Oral Q0600   ondansetron (ZOFRAN) IV  4 mg Intravenous Once   pravastatin  20 mg Oral Daily   Continuous Infusions:   LOS: 1 day    Time spent: 49 minutes spent on chart review, discussion with nursing staff, consultants, updating family and interview/physical exam; more than 50% of that time was spent in counseling and/or coordination of care.    Alvira Philips [place], DO [facility] Available via Epic secure chat 7am-7pm After these hours, please refer to coverage provider listed on [email or web address] 07/21/2021, 11:34 AM

## 2021-07-21 NOTE — Progress Notes (Signed)
Spoke to Dr. Jena Gauss and Dr. Carola Frost given the complexity of her orthopedic injuries we will plan for transfer to [facility] for surgical management.  Anticipate likely OR with Dr. Carola Frost Monday morning pending OR availability.  CT scan of the left shoulder pending.

## 2021-07-21 NOTE — Plan of Care (Signed)

## 2021-07-21 NOTE — Plan of Care (Signed)
  Problem: Education: Goal: Knowledge of General Education information will improve Description: Including pain rating scale, medication(s)/side effects and non-pharmacologic comfort measures Outcome: Progressing   Problem: Activity: Goal: Risk for activity intolerance will decrease Outcome: Progressing   Problem: Pain Managment: Goal: General experience of comfort will improve Outcome: Progressing   

## 2021-07-21 NOTE — Plan of Care (Signed)
Plan of care reviewed and discussed with the patient and her husband. 

## 2021-07-21 NOTE — Progress Notes (Signed)
OT Cancellation Note  Patient Details Name: Kathryn Boyer MRN: [id number] DOB: 1956-02-26   Cancelled Treatment:    Reason Eval/Treat Not Completed: Other (comment) (Planning for transfer to [facility]. Will resume therapy at [facility] and as medically appropriate.)  Nikolaos Maddocks M Delman Goshorn Robby Bulkley MSOT, OTR/L Acute Rehab Pager: [phone] Office: [phone] 07/21/2021, 8:56 AM

## 2021-07-21 NOTE — Progress Notes (Signed)
PT Cancellation Note  Patient Details Name: Kathryn Boyer MRN: [id number] DOB: October 25, 1955   Cancelled Treatment:     PT order received but eval deferred.  Per RN, pt to transfer to [facility] for surgery.  Will follow.   Anokhi Shannon 07/21/2021, 10:09 AM

## 2021-07-22 MED ORDER — MORPHINE SULFATE (PF) 2 MG/ML IV SOLN
1.0000 mg | Freq: Once | INTRAVENOUS | Status: AC | PRN
  Administered 2021-07-22: 16:00:00 2 mg via INTRAVENOUS
  Filled 2021-07-22: qty 1

## 2021-07-22 MED ORDER — MORPHINE SULFATE (PF) 2 MG/ML IV SOLN
2.0000 mg | INTRAVENOUS | Status: DC | PRN
  Administered 2021-07-22 – 2021-07-23 (×7): 2 mg via INTRAVENOUS
  Filled 2021-07-22 (×7): qty 1

## 2021-07-22 MED ORDER — LACTATED RINGERS IV SOLN: INTRAVENOUS | Status: DC

## 2021-07-22 MED ORDER — OXYCODONE HCL 5 MG PO TABS
5.0000 mg | ORAL_TABLET | ORAL | Status: DC | PRN
Start: 2021-07-22 — End: 2021-07-23
  Administered 2021-07-22 (×2): 10 mg via ORAL
  Filled 2021-07-22 (×3): qty 2

## 2021-07-22 NOTE — Progress Notes (Signed)
Patient has bed available at [facility]. Attempted report at 73 and 1450. Charge nurse to call me back when she returns from lunch and determines who to give report to. Awaiting call.

## 2021-07-22 NOTE — Progress Notes (Signed)
Patient seen in 1337. Awaiting transfer to [facility]. They are waiting for a bed to become available at [facility]. Her pain is currently controlled. She did have issues when she was getting transferred to CT last night and was moved abruptly which caused her to have more pain. She is nervous about being transported to [facility].   We discussed definitive need for surgery on her left ankle and left shoulder. Discussed plan for OR tomorrow with Dr. Carola Frost for her left ankle and possibly left medial plateau fracture as well. Patient states her understanding of this and is agreeable for surgical fixation in the AM. Discussed surgery for left proximal humerus fracture possibly ORIF versus arthroplasty. Will likely need arthroplasty. We will discuss this with orthopedic trauma specialist Dr. Carola Frost to determine the official plan regarding operative fixation of her injuries.   - NPO at midnight - Continue current pain management regimen - Bed rest for now - Transfer to [facility] for surgical management  Alfonse Alpers, PA-C 07/22/2021

## 2021-07-22 NOTE — Progress Notes (Signed)
PROGRESS NOTE    Kathryn Boyer  LEX:[id number] DOB: October 08, 1955 DOA: 07/20/2021 PCP: Donato Schultz, DO    Brief Narrative:  Kathryn Boyer is a 65 year old female with past medical history significant for hypothyroidism, essential hypertension, GERD, hyperlipidemia,  who presented to [facility] ED on 11/25 via EMS with complaint of left ankle and shoulder pain following fall.  Patient reports she was walking out of her house and tripped over some carpet cleaning hoses and fell onto her left side.  Denies any loss of conscious or head injury.  Patient was unable to mobilize following the fall and EMS was activated.  In the ED, multiple imaging studies notable for left humerus fracture, left tibial plateau fracture, right foot fifth metatarsal and lateral malleolus fracture, left foot trimalleolar ankle fracture, and left scaphoid deformity.  Orthopedics was consulted.  Hospitalist service consulted for further evaluation and management of multiple fractures and likely need of surgical intervention.   Assessment & Plan:   Principal Problem:   Left trimalleolar fracture, closed, initial encounter Active Problems:   Adjustment disorder with anxiety   Generalized anxiety disorder   Hyperlipidemia LDL goal <100   Fall   Humerus fracture   Tibial plateau fracture, left   Nondisplaced fracture of fifth metatarsal bone, right foot, initial encounter for closed fracture   Nondisplaced fracture of lateral malleolus of right fibula, initial encounter for closed fracture   Left trimalleolar fracture, closed, initial encounter Left Humerus fracture Tibial plateau fracture, left Nondisplaced fracture of fifth metatarsal bone, right foot, initial encounter for closed fracture Nondisplaced fracture of lateral malleolus of right fibula, initial encounter for closed fracture Patient presenting to ED following mechanical fall at home.  No loss of consciousness without head trauma.  Patient sustained  multiple traumatic injuries including left trimalleolar fracture, left humerus fracture, left tibial plateau fracture, right fifth metatarsal fracture, and right lateral malleolus fracture.  Seen by orthopedics and recommends transfer to [facility] for planned surgical invention on Monday, 07/23/2021 with Dr. Carola Frost. --Left shoulder, NWB with sling --Right lower extremity, NWB with cam boot in place --Left lower extremity, NWB with short splint in place --Oxycodone 5-10 mg p.o. q4h PRN moderate pain --Morphine 2 mg IV q2h PRN severe pain --Robaxin 500mg  PO q6h PRN muscle spasms --Surgical invention planned on 07/23/2021 --Hold PT/OT evaluation until postop --NPO after midnight with anticipated surgical intervention tomorrow  HLD: Pravastatin 20 mg p.o. daily  Hypothyroidism: Levothyroxine 100 mcg p.o. daily  Essential hypertension On olmesartan 40 mg p.o. daily at home.  BP currently well controlled, 112/58 this AM. --Hold home antihypertensives for now --Continue monitor blood pressure  Generalized anxiety disorder --Valium 5 mg p.o. q6h PRN    DVT prophylaxis: enoxaparin (LOVENOX) injection 40 mg Start: 07/20/21 2000   Code Status: Full Code Family Communication: Family present at bedside this morning  Disposition Plan:  Level of care: Med-Surg Status is: Inpatient  Remains inpatient appropriate because: Transferring to 07/22/21 for surgical intervention planned on Monday; requiring IV pain control    Consultants:  [facility], Dr. Wednesday  Procedures:  None  Antimicrobials:  None   Subjective: Patient seen examined at bedside, resting comfortably.  Continues with intermittent bouts of pain.  Patient complaining of irritation to her left foot that is in splint and request change of splint and more padding.  Patient also asked results of CT shoulder.  Discussed with patient will further optimize pain regimen for better pain control.  Awaiting transfer to [facility]  for anticipated surgery tomorrow.  No other questions or concerns at this time.  Denies headache, no visual changes, no chest pain, no shortness of breath, no abdominal pain, no fever/chills/night sweats, no nausea/vomiting/diarrhea.  No acute concerns overnight per nursing staff.   Objective: Vitals:   07/21/21 1339 07/21/21 2259 07/21/21 2300 07/22/21 0543  BP: 110/69 (!) 112/58  (!) 112/58  Pulse: 91 91  99  Resp: Temp: 98.3 F (36.8 C) 98.6 F (37 C)  99.9 F (37.7 C)  TempSrc: Oral Oral  Oral  SpO2: 95% (!) 84% 93% 95%  Weight:      Height:        Intake/Output Summary (Last 24 hours) at 07/22/2021 1006 Last data filed at 07/22/2021 0925 Gross per 24 hour  Intake 600 ml  Output 700 ml  Net -100 ml   Filed Weights   07/20/21 1034  Weight: 80.7 kg    Examination:  General exam: Appears calm and comfortable  Respiratory system: Clear to auscultation. Respiratory effort normal.  On room air Cardiovascular system: S1 & S2 heard, RRR. No JVD, murmurs, rubs, gallops or clicks. No pedal edema. Gastrointestinal system: Abdomen is nondistended, soft and nontender. No organomegaly or masses felt. Normal bowel sounds heard. Central nervous system: Alert and oriented. No focal neurological deficits. Extremities: Left upper extremity with sling in place, right lower extremity with Cam boot in place, left lower extremity with splint in place, neurovascularly intact. Skin: No rashes, lesions or ulcers Psychiatry: Judgement and insight appear normal. Mood & affect appropriate.     Data Reviewed: I have personally reviewed following labs and imaging studies  CBC: Recent Labs  Lab 07/20/21 1131 07/21/21 0323  WBC 14.0* 11.0*  NEUTROABS 11.3*  --   HGB 14.7 13.2  HCT 43.4 40.2  MCV 91.2 93.9  PLT 239 190   Basic Metabolic Panel: Recent Labs  Lab 07/20/21 1131 07/21/21 0323  NA 136 135  K 3.9 4.3  CL 105 104  CO2 22 22  GLUCOSE 145* 125*  BUN 12 12   CREATININE 0.86 0.93  CALCIUM 8.8* 8.6*   GFR: Estimated Creatinine Clearance: 65 mL/min (by C-G formula based on SCr of 0.93 mg/dL). Liver Function Tests: Recent Labs  Lab 07/21/21 0323  AST 27  ALT 22  ALKPHOS 67  BILITOT 0.8  PROT 6.6  ALBUMIN 3.9   No results for input(s): LIPASE, AMYLASE in the last 168 hours. No results for input(s): AMMONIA in the last 168 hours. Coagulation Profile: No results for input(s): INR, PROTIME in the last 168 hours. Cardiac Enzymes: No results for input(s): CKTOTAL, CKMB, CKMBINDEX, TROPONINI in the last 168 hours. BNP (last 3 results) No results for input(s): PROBNP in the last 8760 hours. HbA1C: No results for input(s): HGBA1C in the last 72 hours. CBG: No results for input(s): GLUCAP in the last 168 hours. Lipid Profile: No results for input(s): CHOL, HDL, LDLCALC, TRIG, CHOLHDL, LDLDIRECT in the last 72 hours. Thyroid Function Tests: No results for input(s): TSH, T4TOTAL, FREET4, T3FREE, THYROIDAB in the last 72 hours. Anemia Panel: No results for input(s): VITAMINB12, FOLATE, FERRITIN, TIBC, IRON, RETICCTPCT in the last 72 hours. Sepsis Labs: No results for input(s): PROCALCITON, LATICACIDVEN in the last 168 hours.  Recent Results (from the past 240 hour(s))  Resp Panel by RT-PCR (Flu A&B, Covid) Nasopharyngeal Swab     Status: None   Collection Time: 07/20/21 11:31 AM   Specimen: Nasopharyngeal Swab; Nasopharyngeal(NP) swabs  in vial transport medium  Result Value Ref Range Status   SARS Coronavirus 2 by RT PCR NEGATIVE NEGATIVE Final    Comment: (NOTE) SARS-CoV-2 target nucleic acids are NOT DETECTED.  The SARS-CoV-2 RNA is generally detectable in upper respiratory specimens during the acute phase of infection. The lowest concentration of SARS-CoV-2 viral copies this assay can detect is 138 copies/mL. A negative result does not preclude SARS-Cov-2 infection and should not be used as the sole basis for treatment or other  patient management decisions. A negative result may occur with  improper specimen collection/handling, submission of specimen other than nasopharyngeal swab, presence of viral mutation(s) within the areas targeted by this assay, and inadequate number of viral copies(<138 copies/mL). A negative result must be combined with clinical observations, patient history, and epidemiological information. The expected result is Negative.  Fact Sheet for Patients:  BloggerCourse.com  Fact Sheet for Healthcare Providers:  SeriousBroker.it  This test is no t yet approved or cleared by the [place] FDA and  has been authorized for detection and/or diagnosis of SARS-CoV-2 by FDA under an Emergency Use Authorization (EUA). This EUA will remain  in effect (meaning this test can be used) for the duration of the COVID-19 declaration under Section 564(b)(1) of the Act, 21 U.S.C.section 360bbb-3(b)(1), unless the authorization is terminated  or revoked sooner.       Influenza A by PCR NEGATIVE NEGATIVE Final   Influenza B by PCR NEGATIVE NEGATIVE Final    Comment: (NOTE) The Xpert Xpress SARS-CoV-2/FLU/RSV plus assay is intended as an aid in the diagnosis of influenza from Nasopharyngeal swab specimens and should not be used as a sole basis for treatment. Nasal washings and aspirates are unacceptable for Xpert Xpress SARS-CoV-2/FLU/RSV testing.  Fact Sheet for Patients: BloggerCourse.com  Fact Sheet for Healthcare Providers: SeriousBroker.it  This test is not yet approved or cleared by the [place] FDA and has been authorized for detection and/or diagnosis of SARS-CoV-2 by FDA under an Emergency Use Authorization (EUA). This EUA will remain in effect (meaning this test can be used) for the duration of the COVID-19 declaration under Section 564(b)(1) of the Act, 21 U.S.C. section  360bbb-3(b)(1), unless the authorization is terminated or revoked.  Performed at [facility], [address], [place], [place] [address]          Radiology Studies: DG Ribs Unilateral W/Chest Left  Result Date: 07/20/2021 CLINICAL DATA:  Left axillary rib pain extending into the anterior chest status post fall. EXAM: LEFT RIBS AND CHEST - 3+ VIEW COMPARISON:  Chest radiograph 06/21/2021 FINDINGS: Cardiomediastinal silhouette and pulmonary vasculature are within normal limits. The lungs are clear. No pneumothorax. Left proximal humerus fracture better seen on dedicated shoulder radiographs. No displaced left rib fractures identified. IMPRESSION: 1. No displaced left rib fracture. 2. Comminuted, impacted left proximal humerus fracture. Electronically Signed   By: Acquanetta Belling M.D.   On: 07/20/2021 13:09   DG Thoracic Spine 2 View  Result Date: 07/20/2021 CLINICAL DATA:  Pain status post fall. EXAM: THORACIC SPINE 2 VIEWS; LUMBAR SPINE - 2-3 VIEW COMPARISON:  Chest radiograph 06/21/2021 FINDINGS: Thoracic spine: Alignment within normal limits. Vertebral body heights are maintained. Minimal endplate spurring seen at multiple levels of the midthoracic spine. Lumbar spine: Alignment within normal limits. No significant vertebral body or disc height loss. Minimal endplate spurring seen at multiple levels. IMPRESSION: No acute radiographic abnormality of the thoracic or lumbar spine. Electronically Signed   By: Harrington Challenger.D.  On: 07/20/2021 13:19   DG Lumbar Spine 2-3 Views  Result Date: 07/20/2021 CLINICAL DATA:  Pain status post fall. EXAM: THORACIC SPINE 2 VIEWS; LUMBAR SPINE - 2-3 VIEW COMPARISON:  Chest radiograph 06/21/2021 FINDINGS: Thoracic spine: Alignment within normal limits. Vertebral body heights are maintained. Minimal endplate spurring seen at multiple levels of the midthoracic spine. Lumbar spine: Alignment within normal limits. No significant vertebral body  or disc height loss. Minimal endplate spurring seen at multiple levels. IMPRESSION: No acute radiographic abnormality of the thoracic or lumbar spine. Electronically Signed   By: Acquanetta Belling M.D.   On: 07/20/2021 13:19   DG Elbow 2 Views Left  Result Date: 07/20/2021 CLINICAL DATA:  Left elbow pain status post fall. EXAM: LEFT ELBOW - 2 VIEW COMPARISON:  None. FINDINGS: Exam limited due to overlying artifact. No displaced fracture identified. Mild soft tissue swelling seen overlying the olecranon. IMPRESSION: Limited exam.  No displaced fracture identified. Electronically Signed   By: Acquanetta Belling M.D.   On: 07/20/2021 13:10   DG Ankle 2 Views Left  Result Date: 07/20/2021 CLINICAL DATA:  65 year old female with history of trimalleolar left ankle fracture. EXAM: LEFT ANKLE - 2 VIEW COMPARISON:  Earlier the same day FINDINGS: Improved alignment of previously visualized acute trimalleolar fracture with persistent mild lateral displacement of the talus status post casting. IMPRESSION: Improved alignment of previously visualized acute trimalleolar fracture status post casting. Electronically Signed   By: Marliss Coots M.D.   On: 07/20/2021 15:28   DG Ankle Complete Left  Result Date: 07/20/2021 CLINICAL DATA:  Left ankle pain status post fall. EXAM: LEFT ANKLE COMPLETE - 3+ VIEW; LEFT FOOT - COMPLETE 3+ VIEW COMPARISON:  None. FINDINGS: Left ankle: Moderately displaced trimalleolar ankle fracture. There is disruption of the ankle mortise. Large ankle joint effusion. Enthesopathic changes are seen at the insertion of the plantar fascia and Achilles tendon on the calcaneus. Left foot: No fracture or dislocation. 3 x 1 mm radiopaque foreign body seen within the soft tissues along the plantar aspect of the forefoot. IMPRESSION: 1. Moderately displaced trimalleolar left ankle fracture. 2. 3 x 1 mm radiopaque foreign body seen within the soft tissues along the plantar aspect of the forefoot. Electronically  Signed   By: Acquanetta Belling M.D.   On: 07/20/2021 13:24   DG Ankle Complete Right  Result Date: 07/20/2021 CLINICAL DATA:  Right ankle and foot pain status post fall EXAM: RIGHT ANKLE - COMPLETE 3+ VIEW; RIGHT FOOT COMPLETE - 3+ VIEW COMPARISON:  None. FINDINGS: Right ankle: Mild soft tissue swelling overlying the lateral malleolus. There is a subtle lucency within the lateral malleolus best seen on the oblique view which may be a prominent vascular channel versus nondisplaced fracture. Right foot: Mild hallux valgus deformity. Minimally displaced fracture of the base of the fifth metatarsal. Enthesopathic changes are seen at the insertion of the plantar fascia and Achilles tendon on the calcaneus. Nonspecific soft tissue calcifications noted adjacent to the distal third metatarsal. This may be sequelae of remote trauma or represent external artifact. IMPRESSION: 1. Minimally displaced fracture of the base of the fifth metatarsal. 2. Findings suspicious for nondisplaced fracture of the lateral malleolus. Electronically Signed   By: Acquanetta Belling M.D.   On: 07/20/2021 13:16   CT Knee Left Wo Contrast  Result Date: 07/20/2021 CLINICAL DATA:  Knee pain status post fall. Tibial plateau fracture. EXAM: CT OF THE LEFT KNEE WITHOUT CONTRAST TECHNIQUE: Multidetector CT imaging of the left knee was performed  according to the standard protocol. Multiplanar CT image reconstructions were also generated. COMPARISON:  Radiographs same date FINDINGS: Bones/Joint/Cartilage As demonstrated on the earlier radiographs, there is a mildly depressed articular intra-articular fracture of the medial tibial plateau posteriorly. There is 3 mm of depression of the articular surface, best seen on the sagittal images. The tibial spines are intact. There is no involvement of the lateral tibial plateau. The distal femur, patella and proximal fibula are intact. A small lipohemarthrosis is noted. Ligaments Suboptimally assessed by CT.  Muscles and Tendons The extensor mechanism is intact. No focal muscular abnormality identified. Soft tissues No evidence of foreign body, fluid collection or soft tissue emphysema. IMPRESSION: 1. Mildly depressed intra-articular fracture of the medial tibial plateau posteriorly. 2. Associated lipohemarthrosis. 3. Intact lateral tibial plateau and distal femur. Electronically Signed   By: Carey Bullocks M.D.   On: 07/20/2021 16:39   CT SHOULDER LEFT WO CONTRAST  Result Date: 07/21/2021 CLINICAL DATA:  Fall yesterday. Left humeral neck fracture. Preoperative planning. EXAM: CT OF THE UPPER LEFT EXTREMITY WITHOUT CONTRAST TECHNIQUE: Multidetector CT imaging of the left shoulder was performed according to the standard protocol. COMPARISON:  Radiographs 07/20/2021 FINDINGS: Bones/Joint/Cartilage The bones are demineralized. Comminuted fracture of the left humeral neck demonstrates impaction, apex anterior angulation and up to 6 mm of anterior displacement. This fracture involves both tuberosities as well as the superior articular surface of the humeral head. The humeral head remains located. No evidence of glenoid or other scapular fracture. There is a moderate size hemarthrosis. Mild acromioclavicular degenerative changes are present. Ligaments Suboptimally assessed by CT. Muscles and Tendons Grossly intact rotator cuff without evidence of focal muscular atrophy. There is some edema anteriorly in the deltoid muscle without evidence of focal hematoma. Soft tissues Subcutaneous edema anteriorly and laterally consistent with contusion. No evidence of foreign body or soft tissue emphysema. Mild left lung emphysematous changes are noted. IMPRESSION: 1. Comminuted and displaced fracture of the left humeral neck as described. This fracture involves both tuberosities as well as the superior articular surface of the humeral head. 2. No evidence of glenoid or other scapular fracture. 3. Anterior soft tissue contusion  without evidence of focal hematoma. Electronically Signed   By: Carey Bullocks M.D.   On: 07/21/2021 10:06   CT ANKLE LEFT WO CONTRAST  Result Date: 07/20/2021 CLINICAL DATA:  Ankle fracture post reduction. Preoperative planning. EXAM: CT OF THE LEFT ANKLE WITHOUT CONTRAST TECHNIQUE: Multidetector CT imaging of the left ankle was performed according to the standard protocol. Multiplanar CT image reconstructions were also generated. COMPARISON:  Radiographs earlier the same date. FINDINGS: Bones/Joint/Cartilage The ankle is casted. Trimalleolar fracture demonstrates improved alignment compared with the original radiographs. Nearly transverse fracture through the base of the medial malleolus remains minimally displaced laterally. Comminuted fracture of the posterior malleolus demonstrates up to 11 mm of posterior displacement of the articular surface as well as depression of the articular surface by 5 mm. There is a comminuted fracture of the distal fibula with an anteriorly displaced butterfly fragment. No widening of the ankle mortise. The talus is located. The talar dome is intact. No definite tarsal bone fractures are identified. Multicentric os peroneum and accessory navicular noted. Ligaments Suboptimally assessed by CT. Muscles and Tendons The ankle tendons appear intact. The posterior tibialis and flexor digitorum longus tendons are in close proximity to the fracture of the posterior malleolus, but no tendon entrapment identified Soft tissues No foreign body, focal fluid collection or soft tissue emphysema. There  is moderate lateral soft tissue swelling. IMPRESSION: 1. Mild residual displacement of the trimalleolar fracture, improved from the original radiographs. 2. No evidence of tarsal bone fracture. 3. The ankle tendons appear intact without entrapment. Electronically Signed   By: Carey Bullocks M.D.   On: 07/20/2021 17:19   DG Shoulder Left  Result Date: 07/20/2021 CLINICAL DATA:  Fall.  Left  shoulder deformity and pain. EXAM: LEFT SHOULDER - 2+ VIEW COMPARISON:  None. FINDINGS: Comminuted, impacted fracture of the left proximal humerus. Visualized soft tissues are unremarkable. IMPRESSION: Comminuted, impacted left proximal humerus fracture. Electronically Signed   By: Acquanetta Belling M.D.   On: 07/20/2021 13:06   DG Hand Complete Left  Result Date: 07/20/2021 CLINICAL DATA:  Left hand pain status post fall. EXAM: LEFT HAND - COMPLETE 3+ VIEW COMPARISON:  None. FINDINGS: Deformity of the scaphoid most likely sequelae of prior trauma. Otherwise no acute fracture or dislocation. Mild degenerative changes seen at the first carpometacarpal joint. IMPRESSION: Deformity of the scaphoid most likely sequelae of remote trauma. If the patient has snuffbox tenderness, further evaluation with dedicated wrist radiographs should be obtained. Electronically Signed   By: Acquanetta Belling M.D.   On: 07/20/2021 13:12   DG Foot Complete Left  Result Date: 07/20/2021 CLINICAL DATA:  Left ankle pain status post fall. EXAM: LEFT ANKLE COMPLETE - 3+ VIEW; LEFT FOOT - COMPLETE 3+ VIEW COMPARISON:  None. FINDINGS: Left ankle: Moderately displaced trimalleolar ankle fracture. There is disruption of the ankle mortise. Large ankle joint effusion. Enthesopathic changes are seen at the insertion of the plantar fascia and Achilles tendon on the calcaneus. Left foot: No fracture or dislocation. 3 x 1 mm radiopaque foreign body seen within the soft tissues along the plantar aspect of the forefoot. IMPRESSION: 1. Moderately displaced trimalleolar left ankle fracture. 2. 3 x 1 mm radiopaque foreign body seen within the soft tissues along the plantar aspect of the forefoot. Electronically Signed   By: Acquanetta Belling M.D.   On: 07/20/2021 13:24   DG Foot Complete Right  Result Date: 07/20/2021 CLINICAL DATA:  Right ankle and foot pain status post fall EXAM: RIGHT ANKLE - COMPLETE 3+ VIEW; RIGHT FOOT COMPLETE - 3+ VIEW COMPARISON:   None. FINDINGS: Right ankle: Mild soft tissue swelling overlying the lateral malleolus. There is a subtle lucency within the lateral malleolus best seen on the oblique view which may be a prominent vascular channel versus nondisplaced fracture. Right foot: Mild hallux valgus deformity. Minimally displaced fracture of the base of the fifth metatarsal. Enthesopathic changes are seen at the insertion of the plantar fascia and Achilles tendon on the calcaneus. Nonspecific soft tissue calcifications noted adjacent to the distal third metatarsal. This may be sequelae of remote trauma or represent external artifact. IMPRESSION: 1. Minimally displaced fracture of the base of the fifth metatarsal. 2. Findings suspicious for nondisplaced fracture of the lateral malleolus. Electronically Signed   By: Acquanetta Belling M.D.   On: 07/20/2021 13:16   DG Knee 3 Views Left  Result Date: 07/20/2021 CLINICAL DATA:  Pain status post fall EXAM: LEFT KNEE - 3 VIEW COMPARISON:  None. FINDINGS: Minimally depressed fracture of the posterior lateral tibial plateau. No additional fracture or dislocation. Atherosclerotic changes seen throughout visualized arterial segments. IMPRESSION: Minimally depressed fracture of the posterolateral tibial plateau. Electronically Signed   By: Acquanetta Belling M.D.   On: 07/20/2021 13:20        Scheduled Meds:  enoxaparin (LOVENOX) injection  40 mg Subcutaneous  Q24H   levothyroxine  100 mcg Oral Q0600   ondansetron (ZOFRAN) IV  4 mg Intravenous Once   pravastatin  20 mg Oral Daily   Continuous Infusions:   LOS: 2 days    Time spent: 41 minutes spent on chart review, discussion with nursing staff, consultants, updating family and interview/physical exam; more than 50% of that time was spent in counseling and/or coordination of care.    Alvira Philips [place], DO [facility] Available via Epic secure chat 7am-7pm After these hours, please refer to coverage provider listed on  [email or web address] 07/22/2021, 10:06 AM

## 2021-07-22 NOTE — Plan of Care (Signed)
  Problem: Education: Goal: Knowledge of General Education information will improve Description: Including pain rating scale, medication(s)/side effects and non-pharmacologic comfort measures Outcome: Progressing   Problem: Activity: Goal: Risk for activity intolerance will decrease Outcome: Progressing   Problem: Pain Managment: Goal: General experience of comfort will improve Outcome: Progressing   

## 2021-07-22 NOTE — Progress Notes (Signed)
Orthopedic Tech Progress Note Patient Details:  Kathryn Boyer May 02, 1956 [id number]  Ortho Devices Type of Ortho Device: Ace wrap Ortho Device/Splint Location: replace ace wraps Ortho Device/Splint Interventions: Application   Post Interventions Patient Tolerated: Well Instructions Provided: Care of device  Saul Fordyce 07/22/2021, 1:35 PM

## 2021-07-23 ENCOUNTER — Inpatient Hospital Stay (HOSPITAL_COMMUNITY): Payer: BC Managed Care – PPO | Admitting: Anesthesiology

## 2021-07-23 ENCOUNTER — Inpatient Hospital Stay (HOSPITAL_COMMUNITY): Payer: BC Managed Care – PPO

## 2021-07-23 ENCOUNTER — Encounter (HOSPITAL_COMMUNITY)
Admission: EM | Disposition: A | Discharge status: Rehabilitation Facility | Payer: Self-pay | Source: Home / Self Care | Attending: Orthopaedic Surgery

## 2021-07-23 DIAGNOSIS — S82142A Displaced bicondylar fracture of left tibia, initial encounter for closed fracture: Secondary | ICD-10-CM

## 2021-07-23 DIAGNOSIS — T07XXXA Unspecified multiple injuries, initial encounter: Secondary | ICD-10-CM

## 2021-07-23 HISTORY — PX: ORIF ANKLE FRACTURE: SHX5408

## 2021-07-23 LAB — CBC
HCT: 33.2 % — ABNORMAL LOW (ref 36.0–46.0)
HCT: 31.2 % — ABNORMAL LOW (ref 36.0–46.0)
Hemoglobin: 11.2 g/dL — ABNORMAL LOW (ref 12.0–15.0)
Hemoglobin: 10.2 g/dL — ABNORMAL LOW (ref 12.0–15.0)
MCH: 30.7 pg (ref 26.0–34.0)
MCH: 30.4 pg (ref 26.0–34.0)
MCHC: 33.7 g/dL (ref 30.0–36.0)
MCHC: 32.7 g/dL (ref 30.0–36.0)
MCV: 91 fL (ref 80.0–100.0)
MCV: 92.9 fL (ref 80.0–100.0)
Platelets: 165 10*3/uL (ref 150–400)
Platelets: 179 10*3/uL (ref 150–400)
RBC: 3.65 MIL/uL — ABNORMAL LOW (ref 3.87–5.11)
RBC: 3.36 MIL/uL — ABNORMAL LOW (ref 3.87–5.11)
RDW: 12.1 % (ref 11.5–15.5)
RDW: 12 % (ref 11.5–15.5)
WBC: 7.6 10*3/uL (ref 4.0–10.5)
WBC: 7.2 10*3/uL (ref 4.0–10.5)
nRBC: 0 % (ref 0.0–0.2)
nRBC: 0 % (ref 0.0–0.2)

## 2021-07-23 LAB — BASIC METABOLIC PANEL
Anion gap: 8 (ref 5–15)
BUN: 8 mg/dL (ref 8–23)
CO2: 24 mmol/L (ref 22–32)
Calcium: 8.4 mg/dL — ABNORMAL LOW (ref 8.9–10.3)
Chloride: 102 mmol/L (ref 98–111)
Creatinine, Ser: 0.81 mg/dL (ref 0.44–1.00)
GFR, Estimated: >60 mL/min (ref >60)
Glucose, Bld: 106 mg/dL — ABNORMAL HIGH (ref 70–99)
Potassium: 4.2 mmol/L (ref 3.5–5.1)
Sodium: 134 mmol/L — ABNORMAL LOW (ref 135–145)

## 2021-07-23 LAB — CREATININE, SERUM
Creatinine, Ser: 0.92 mg/dL (ref 0.44–1.00)
GFR, Estimated: >60 mL/min (ref >60)

## 2021-07-23 LAB — SURGICAL PCR SCREEN
MRSA, PCR: NEGATIVE
Staphylococcus aureus: NEGATIVE

## 2021-07-23 SURGERY — OPEN REDUCTION INTERNAL FIXATION (ORIF) ANKLE FRACTURE
Anesthesia: General | Site: Ankle | Laterality: Left

## 2021-07-23 MED ORDER — FENTANYL CITRATE (PF) 100 MCG/2ML IJ SOLN: 100.0000 ug | Freq: Once | INTRAMUSCULAR | Status: AC

## 2021-07-23 MED ORDER — MIDAZOLAM HCL 2 MG/2ML IJ SOLN
INTRAMUSCULAR | Status: DC | PRN
  Administered 2021-07-23: 2 mg via INTRAVENOUS

## 2021-07-23 MED ORDER — MIDAZOLAM HCL 2 MG/2ML IJ SOLN
INTRAMUSCULAR | Status: AC
  Filled 2021-07-23: qty 2

## 2021-07-23 MED ORDER — ROCURONIUM BROMIDE 10 MG/ML (PF) SYRINGE
PREFILLED_SYRINGE | INTRAVENOUS | Status: DC | PRN
Start: 2021-07-23 — End: 2021-07-23
  Administered 2021-07-23: 40 mg via INTRAVENOUS
  Administered 2021-07-23: 60 mg via INTRAVENOUS

## 2021-07-23 MED ORDER — MAGNESIUM HYDROXIDE 400 MG/5ML PO SUSP
30.0000 mL | Freq: Every day | ORAL | Status: DC | PRN
  Filled 2021-07-23: qty 30

## 2021-07-23 MED ORDER — ASCORBIC ACID 500 MG PO TABS
500.0000 mg | ORAL_TABLET | Freq: Every day | ORAL | Status: DC
  Administered 2021-07-23 – 2021-07-27 (×4): 500 mg via ORAL
  Filled 2021-07-23 (×4): qty 1

## 2021-07-23 MED ORDER — ENOXAPARIN SODIUM 40 MG/0.4ML IJ SOSY
40.0000 mg | PREFILLED_SYRINGE | INTRAMUSCULAR | Status: DC
  Administered 2021-07-24: 40 mg via SUBCUTANEOUS
  Filled 2021-07-23: qty 0.4

## 2021-07-23 MED ORDER — FLEET ENEMA 7-19 GM/118ML RE ENEM: 1.0000 | ENEMA | Freq: Once | RECTAL | Status: DC | PRN

## 2021-07-23 MED ORDER — ACETAMINOPHEN 500 MG PO TABS
1000.0000 mg | ORAL_TABLET | Freq: Once | ORAL | Status: AC
  Administered 2021-07-23: 08:00:00 1000 mg via ORAL
  Filled 2021-07-23: qty 2

## 2021-07-23 MED ORDER — ROCURONIUM BROMIDE 10 MG/ML (PF) SYRINGE
PREFILLED_SYRINGE | INTRAVENOUS | Status: AC
  Filled 2021-07-23: qty 10

## 2021-07-23 MED ORDER — LACTATED RINGERS IV SOLN: INTRAVENOUS | Status: DC

## 2021-07-23 MED ORDER — HYDROMORPHONE HCL 1 MG/ML IJ SOLN
0.5000 mg | INTRAMUSCULAR | Status: DC | PRN
  Administered 2021-07-24 – 2021-07-25 (×3): 1 mg via INTRAVENOUS
  Filled 2021-07-23 (×3): qty 1

## 2021-07-23 MED ORDER — CALCIUM CITRATE 950 (200 CA) MG PO TABS
200.0000 mg | ORAL_TABLET | Freq: Two times a day (BID) | ORAL | Status: DC
  Administered 2021-07-23 – 2021-07-27 (×8): 200 mg via ORAL
  Filled 2021-07-23 (×12): qty 1

## 2021-07-23 MED ORDER — ONDANSETRON HCL 4 MG/2ML IJ SOLN
4.0000 mg | Freq: Four times a day (QID) | INTRAMUSCULAR | Status: DC | PRN
  Administered 2021-07-25: 4 mg via INTRAVENOUS
  Filled 2021-07-23: qty 2

## 2021-07-23 MED ORDER — ALBUMIN HUMAN 5 % IV SOLN: INTRAVENOUS | Status: DC | PRN

## 2021-07-23 MED ORDER — ACETAMINOPHEN 500 MG PO TABS
1000.0000 mg | ORAL_TABLET | Freq: Three times a day (TID) | ORAL | Status: DC
  Administered 2021-07-23 – 2021-07-25 (×6): 1000 mg via ORAL
  Filled 2021-07-23 (×6): qty 2

## 2021-07-23 MED ORDER — FENTANYL CITRATE (PF) 250 MCG/5ML IJ SOLN
INTRAMUSCULAR | Status: AC
  Filled 2021-07-23: qty 5

## 2021-07-23 MED ORDER — OXYCODONE HCL 5 MG PO TABS
5.0000 mg | ORAL_TABLET | ORAL | Status: DC | PRN
  Administered 2021-07-23 – 2021-07-24 (×3): 10 mg via ORAL
  Filled 2021-07-23 (×5): qty 2

## 2021-07-23 MED ORDER — METOCLOPRAMIDE HCL 5 MG PO TABS: 5.0000 mg | ORAL_TABLET | Freq: Three times a day (TID) | ORAL | Status: DC | PRN

## 2021-07-23 MED ORDER — CHLORHEXIDINE GLUCONATE 0.12 % MT SOLN
OROMUCOSAL | Status: AC
  Administered 2021-07-23: 08:00:00 15 mL
  Filled 2021-07-23: qty 15

## 2021-07-23 MED ORDER — POLYETHYLENE GLYCOL 3350 17 G PO PACK
17.0000 g | PACK | Freq: Two times a day (BID) | ORAL | Status: DC | PRN
  Administered 2021-07-26: 17 g via ORAL

## 2021-07-23 MED ORDER — SORBITOL 70 % SOLN
30.0000 mL | Freq: Every day | Status: DC | PRN
  Filled 2021-07-23: qty 30

## 2021-07-23 MED ORDER — CEFAZOLIN SODIUM-DEXTROSE 1-4 GM/50ML-% IV SOLN
1.0000 g | Freq: Four times a day (QID) | INTRAVENOUS | Status: AC
  Administered 2021-07-23 – 2021-07-24 (×3): 1 g via INTRAVENOUS
  Filled 2021-07-23 (×4): qty 50

## 2021-07-23 MED ORDER — CHLORHEXIDINE GLUCONATE 0.12 % MT SOLN: 15.0000 mL | Freq: Once | OROMUCOSAL | Status: DC

## 2021-07-23 MED ORDER — ONDANSETRON HCL 4 MG/2ML IJ SOLN
INTRAMUSCULAR | Status: DC | PRN
  Administered 2021-07-23: 4 mg via INTRAVENOUS

## 2021-07-23 MED ORDER — PROPOFOL 10 MG/ML IV BOLUS
INTRAVENOUS | Status: DC | PRN
  Administered 2021-07-23: 130 mg via INTRAVENOUS

## 2021-07-23 MED ORDER — ONDANSETRON HCL 4 MG PO TABS
4.0000 mg | ORAL_TABLET | Freq: Four times a day (QID) | ORAL | Status: DC | PRN
  Administered 2021-07-25: 4 mg via ORAL
  Filled 2021-07-23: qty 1

## 2021-07-23 MED ORDER — PROMETHAZINE HCL 25 MG/ML IJ SOLN: 6.2500 mg | INTRAMUSCULAR | Status: DC | PRN

## 2021-07-23 MED ORDER — METOCLOPRAMIDE HCL 5 MG/ML IJ SOLN: 5.0000 mg | Freq: Three times a day (TID) | INTRAMUSCULAR | Status: DC | PRN

## 2021-07-23 MED ORDER — MIDAZOLAM HCL 2 MG/2ML IJ SOLN: 2.0000 mg | Freq: Once | INTRAMUSCULAR | Status: AC

## 2021-07-23 MED ORDER — DEXAMETHASONE SODIUM PHOSPHATE 10 MG/ML IJ SOLN
INTRAMUSCULAR | Status: AC
  Filled 2021-07-23: qty 1

## 2021-07-23 MED ORDER — PROPOFOL 10 MG/ML IV BOLUS
INTRAVENOUS | Status: AC
  Filled 2021-07-23: qty 20

## 2021-07-23 MED ORDER — CLONIDINE HCL (ANALGESIA) 100 MCG/ML EP SOLN
EPIDURAL | Status: DC | PRN
  Administered 2021-07-23: 100 ug

## 2021-07-23 MED ORDER — CEFAZOLIN SODIUM-DEXTROSE 2-3 GM-%(50ML) IV SOLR
INTRAVENOUS | Status: DC | PRN
  Administered 2021-07-23: 2 g via INTRAVENOUS

## 2021-07-23 MED ORDER — FENTANYL CITRATE (PF) 100 MCG/2ML IJ SOLN
INTRAMUSCULAR | Status: AC
  Administered 2021-07-23: 09:00:00 100 ug via INTRAVENOUS
  Filled 2021-07-23: qty 2

## 2021-07-23 MED ORDER — PHENYLEPHRINE 40 MCG/ML (10ML) SYRINGE FOR IV PUSH (FOR BLOOD PRESSURE SUPPORT)
PREFILLED_SYRINGE | INTRAVENOUS | Status: DC | PRN
  Administered 2021-07-23: 120 ug via INTRAVENOUS
  Administered 2021-07-23: 160 ug via INTRAVENOUS
  Administered 2021-07-23: 120 ug via INTRAVENOUS
  Administered 2021-07-23: 80 ug via INTRAVENOUS
  Administered 2021-07-23: 160 ug via INTRAVENOUS

## 2021-07-23 MED ORDER — SENNOSIDES-DOCUSATE SODIUM 8.6-50 MG PO TABS: 1.0000 | ORAL_TABLET | Freq: Two times a day (BID) | ORAL | Status: DC | PRN

## 2021-07-23 MED ORDER — ONDANSETRON HCL 4 MG/2ML IJ SOLN
INTRAMUSCULAR | Status: AC
  Filled 2021-07-23: qty 2

## 2021-07-23 MED ORDER — DEXAMETHASONE SODIUM PHOSPHATE 10 MG/ML IJ SOLN
INTRAMUSCULAR | Status: DC | PRN
  Administered 2021-07-23: 10 mg via INTRAVENOUS

## 2021-07-23 MED ORDER — FENTANYL CITRATE (PF) 250 MCG/5ML IJ SOLN
INTRAMUSCULAR | Status: DC | PRN
  Administered 2021-07-23: 100 ug via INTRAVENOUS

## 2021-07-23 MED ORDER — PHENYLEPHRINE HCL-NACL 20-0.9 MG/250ML-% IV SOLN
INTRAVENOUS | Status: DC | PRN
  Administered 2021-07-23: 50 ug/min via INTRAVENOUS

## 2021-07-23 MED ORDER — VITAMIN D 25 MCG (1000 UNIT) PO TABS
2000.0000 [IU] | ORAL_TABLET | Freq: Two times a day (BID) | ORAL | Status: DC
  Administered 2021-07-23 – 2021-07-27 (×9): 2000 [IU] via ORAL
  Filled 2021-07-23 (×9): qty 2

## 2021-07-23 MED ORDER — CEFAZOLIN SODIUM-DEXTROSE 2-4 GM/100ML-% IV SOLN
INTRAVENOUS | Status: AC
  Filled 2021-07-23: qty 100

## 2021-07-23 MED ORDER — LIDOCAINE 2% (20 MG/ML) 5 ML SYRINGE
INTRAMUSCULAR | Status: DC | PRN
  Administered 2021-07-23: 80 mg via INTRAVENOUS

## 2021-07-23 MED ORDER — LIDOCAINE 2% (20 MG/ML) 5 ML SYRINGE
INTRAMUSCULAR | Status: AC
  Filled 2021-07-23: qty 5

## 2021-07-23 MED ORDER — ORAL CARE MOUTH RINSE: 15.0000 mL | Freq: Once | OROMUCOSAL | Status: DC

## 2021-07-23 MED ORDER — MIDAZOLAM HCL 2 MG/2ML IJ SOLN
INTRAMUSCULAR | Status: AC
  Administered 2021-07-23: 09:00:00 2 mg via INTRAVENOUS
  Filled 2021-07-23: qty 2

## 2021-07-23 MED ORDER — BUPIVACAINE-EPINEPHRINE (PF) 0.5% -1:200000 IJ SOLN
INTRAMUSCULAR | Status: DC | PRN
  Administered 2021-07-23: 10 mL via PERINEURAL
  Administered 2021-07-23: 30 mL via PERINEURAL

## 2021-07-23 MED ORDER — SUGAMMADEX SODIUM 200 MG/2ML IV SOLN
INTRAVENOUS | Status: DC | PRN
  Administered 2021-07-23: 200 mg via INTRAVENOUS

## 2021-07-23 MED ORDER — NALOXONE HCL 0.4 MG/ML IJ SOLN: 0.4000 mg | INTRAMUSCULAR | Status: DC | PRN

## 2021-07-23 MED ORDER — DOCUSATE SODIUM 100 MG PO CAPS
100.0000 mg | ORAL_CAPSULE | Freq: Two times a day (BID) | ORAL | Status: DC
  Administered 2021-07-23 – 2021-07-25 (×5): 100 mg via ORAL
  Filled 2021-07-23 (×5): qty 1

## 2021-07-23 MED ORDER — 0.9 % SODIUM CHLORIDE (POUR BTL) OPTIME
TOPICAL | Status: DC | PRN
  Administered 2021-07-23: 10:00:00 1000 mL

## 2021-07-23 MED ORDER — FENTANYL CITRATE (PF) 100 MCG/2ML IJ SOLN: 25.0000 ug | INTRAMUSCULAR | Status: DC | PRN

## 2021-07-23 MED ORDER — ACETAMINOPHEN 325 MG PO TABS: 325.0000 mg | ORAL_TABLET | Freq: Four times a day (QID) | ORAL | Status: DC | PRN

## 2021-07-23 SURGICAL SUPPLY — 105 items
APL SKNCLS STERI-STRIP NONHPOA (GAUZE/BANDAGES/DRESSINGS)
BAG COUNTER SPONGE SURGICOUNT (BAG) ×3 IMPLANT
BAG SPNG CNTER NS LX DISP (BAG) ×2
BAG SURGICOUNT SPONGE COUNTING (BAG) ×1
BANDAGE ESMARK 6X9 LF (GAUZE/BANDAGES/DRESSINGS) ×2 IMPLANT
BENZOIN TINCTURE PRP APPL 2/3 (GAUZE/BANDAGES/DRESSINGS) IMPLANT
BIT DRILL CANN LONG 4.6X220 (DRILL) ×2 IMPLANT
BNDG CMPR 9X6 STRL LF SNTH (GAUZE/BANDAGES/DRESSINGS) ×2
BNDG ELASTIC 4X5.8 VLCR STR LF (GAUZE/BANDAGES/DRESSINGS) ×4 IMPLANT
BNDG ELASTIC 6X5.8 VLCR STR LF (GAUZE/BANDAGES/DRESSINGS) ×4 IMPLANT
BNDG ESMARK 6X9 LF (GAUZE/BANDAGES/DRESSINGS) ×4
BNDG GAUZE ELAST 4 BULKY (GAUZE/BANDAGES/DRESSINGS) IMPLANT
BRUSH SCRUB EZ PLAIN DRY (MISCELLANEOUS) ×8 IMPLANT
COVER MAYO STAND STRL (DRAPES) ×4 IMPLANT
COVER SURGICAL LIGHT HANDLE (MISCELLANEOUS) ×8 IMPLANT
CUFF TOURN SGL QUICK 34 (TOURNIQUET CUFF) ×4
CUFF TRNQT CYL 34X4.125X (TOURNIQUET CUFF) ×2 IMPLANT
DRAPE C-ARM 42X72 X-RAY (DRAPES) ×4 IMPLANT
DRAPE C-ARMOR (DRAPES) ×4 IMPLANT
DRAPE HALF SHEET 40X57 (DRAPES) ×4 IMPLANT
DRAPE IMP U-DRAPE 54X76 (DRAPES) ×4 IMPLANT
DRAPE INCISE IOBAN 66X45 STRL (DRAPES) ×4 IMPLANT
DRAPE ORTHO SPLIT 77X108 STRL (DRAPES) ×8
DRAPE SURG 17X11 SM STRL (DRAPES) IMPLANT
DRAPE SURG ORHT 6 SPLT 77X108 (DRAPES) ×4 IMPLANT
DRAPE U-SHAPE 47X51 STRL (DRAPES) ×8 IMPLANT
DRILL CANN LONG 4.6X220 (DRILL) ×4
DRSG ADAPTIC 3X8 NADH LF (GAUZE/BANDAGES/DRESSINGS) ×4 IMPLANT
DRSG EMULSION OIL 3X3 NADH (GAUZE/BANDAGES/DRESSINGS) IMPLANT
DRSG PAD ABDOMINAL 8X10 ST (GAUZE/BANDAGES/DRESSINGS) ×4 IMPLANT
ELECT BLADE 4.0 EZ CLEAN MEGAD (MISCELLANEOUS) ×4
ELECT REM PT RETURN 9FT ADLT (ELECTROSURGICAL) ×4
ELECTRODE BLDE 4.0 EZ CLN MEGD (MISCELLANEOUS) ×2 IMPLANT
ELECTRODE REM PT RTRN 9FT ADLT (ELECTROSURGICAL) ×2 IMPLANT
EVACUATOR 1/8 PVC DRAIN (DRAIN) IMPLANT
GAUZE SPONGE 4X4 12PLY STRL (GAUZE/BANDAGES/DRESSINGS) ×8 IMPLANT
GLOVE SRG 8 PF TXTR STRL LF DI (GLOVE) ×2 IMPLANT
GLOVE SURG ENC MOIS LTX SZ8 (GLOVE) ×4 IMPLANT
GLOVE SURG ORTHO LTX SZ7.5 (GLOVE) ×8 IMPLANT
GLOVE SURG UNDER POLY LF SZ7.5 (GLOVE) ×4 IMPLANT
GLOVE SURG UNDER POLY LF SZ8 (GLOVE) ×4
GOWN STRL REUS W/ TWL LRG LVL3 (GOWN DISPOSABLE) ×4 IMPLANT
GOWN STRL REUS W/ TWL XL LVL3 (GOWN DISPOSABLE) ×2 IMPLANT
GOWN STRL REUS W/TWL 2XL LVL3 (GOWN DISPOSABLE) ×4 IMPLANT
GOWN STRL REUS W/TWL LRG LVL3 (GOWN DISPOSABLE) ×8
GOWN STRL REUS W/TWL XL LVL3 (GOWN DISPOSABLE) ×4
K-WIRE SNGL END 1.2X150 (MISCELLANEOUS) ×8
KIT BASIN OR (CUSTOM PROCEDURE TRAY) ×4 IMPLANT
KIT TURNOVER KIT B (KITS) ×4 IMPLANT
KWIRE SNGL END 1.2X150 (MISCELLANEOUS) ×4 IMPLANT
LOOP VESSEL MAXI BLUE (MISCELLANEOUS) IMPLANT
MANIFOLD NEPTUNE II (INSTRUMENTS) ×4 IMPLANT
NEEDLE HYPO 21X1.5 SAFETY (NEEDLE) IMPLANT
NS IRRIG 1000ML POUR BTL (IV SOLUTION) ×4 IMPLANT
PACK GENERAL/GYN (CUSTOM PROCEDURE TRAY) ×4 IMPLANT
PACK ORTHO EXTREMITY (CUSTOM PROCEDURE TRAY) ×4 IMPLANT
PACK TOTAL JOINT (CUSTOM PROCEDURE TRAY) ×4 IMPLANT
PACK UNIVERSAL I (CUSTOM PROCEDURE TRAY) ×4 IMPLANT
PAD ARMBOARD 7.5X6 YLW CONV (MISCELLANEOUS) ×8 IMPLANT
PAD CAST 4YDX4 CTTN HI CHSV (CAST SUPPLIES) ×2 IMPLANT
PADDING CAST COTTON 4X4 STRL (CAST SUPPLIES) ×4
PADDING CAST COTTON 6X4 STRL (CAST SUPPLIES) ×4 IMPLANT
PLATE DIST TIB GORILLA 5H LT (Plate) ×4 IMPLANT
PLATE FIB STRT GORILLA 3H (Plate) ×4 IMPLANT
PLATE POST FIB AG 9H (Plate) ×4 IMPLANT
SCREW 3.5X22 (Screw) ×4 IMPLANT
SCREW CANN NL 3.5X40 (Screw) ×8 IMPLANT
SCREW CANN SHT THR 4X34 (Screw) ×4 IMPLANT
SCREW CANN.SHORT HEAD 4.0X32MM (Screw) ×4 IMPLANT
SCREW LOCK 3 3.5X18 (Screw) ×4 IMPLANT
SCREW LOCK PLATE R3 3.5X20 (Screw) ×4 IMPLANT
SCREW LOCK PLATE R3 3.5X22 (Screw) ×8 IMPLANT
SCREW LOCK PLATE R3 3.5X36 (Screw) ×4 IMPLANT
SCREW N/L PLATE 3.5X42 (Screw) ×4 IMPLANT
SCREW NLOCK PLATE RECON 3.5X10 (Screw) ×8 IMPLANT
SCREW NON LOCKING 3.5X12 (Screw) ×8 IMPLANT
SCREW NON LOCKING 3.5X14 (Screw) ×4 IMPLANT
SCREW NONLOCK 3.5X34 (Screw) ×4 IMPLANT
SCREW PLT 36X3.5X NONLOCK (Screw) ×2 IMPLANT
SCREW R3CON NONLOCK 3.5X38 (Screw) ×4 IMPLANT
SPONGE T-LAP 18X18 ~~LOC~~+RFID (SPONGE) ×8 IMPLANT
STAPLER VISISTAT 35W (STAPLE) ×4 IMPLANT
STOCKINETTE IMPERVIOUS LG (DRAPES) ×4 IMPLANT
SUCTION FRAZIER HANDLE 10FR (MISCELLANEOUS) ×4
SUCTION TUBE FRAZIER 10FR DISP (MISCELLANEOUS) ×2 IMPLANT
SUT ETHIBOND 5 LR DA (SUTURE) IMPLANT
SUT ETHILON 2 0 FS 18 (SUTURE) ×8 IMPLANT
SUT FIBERWIRE #2 38 T-5 BLUE (SUTURE) ×4
SUT PDS AB 2-0 CT1 27 (SUTURE) ×8 IMPLANT
SUT VIC AB 0 CT1 27 (SUTURE) ×8
SUT VIC AB 0 CT1 27XBRD ANBCTR (SUTURE) ×4 IMPLANT
SUT VIC AB 2-0 CT1 27 (SUTURE) ×8
SUT VIC AB 2-0 CT1 TAPERPNT 27 (SUTURE) ×4 IMPLANT
SUT VIC AB 2-0 CT3 27 (SUTURE) ×8 IMPLANT
SUTURE FIBERWR #2 38 T-5 BLUE (SUTURE) ×2 IMPLANT
SYR 5ML LL (SYRINGE) IMPLANT
TOWEL GREEN STERILE (TOWEL DISPOSABLE) ×8 IMPLANT
TOWEL GREEN STERILE FF (TOWEL DISPOSABLE) ×4 IMPLANT
TRAY FOLEY MTR SLVR 16FR STAT (SET/KITS/TRAYS/PACK) ×4 IMPLANT
TUBE CONNECTING 12'X1/4 (SUCTIONS) ×1
TUBE CONNECTING 12X1/4 (SUCTIONS) ×3 IMPLANT
UNDERPAD 30X36 HEAVY ABSORB (UNDERPADS AND DIAPERS) ×8 IMPLANT
WATER STERILE IRR 1000ML POUR (IV SOLUTION) IMPLANT
WIRE OLIVE SMOOTH 1.6X100 LNG (WIRE) ×4 IMPLANT
YANKAUER SUCT BULB TIP NO VENT (SUCTIONS) IMPLANT

## 2021-07-23 NOTE — Progress Notes (Signed)
Orthopedic Tech Progress Note Patient Details:  Kathryn Boyer 1956/07/01 [id number]  Patient ID: [name], female   DOB: [birth year], 65 y.o.   MRN: [id number] Dropped of post op shoe with OR nurse.  [name] 07/23/2021, 11:09 AM

## 2021-07-23 NOTE — Anesthesia Procedure Notes (Signed)
Procedure Name: Intubation Date/Time: 07/23/2021 9:02 AM Performed by: Rosiland Oz, CRNA Pre-anesthesia Checklist: Patient identified, Emergency Drugs available, Suction available, Patient being monitored and Timeout performed Patient Re-evaluated:Patient Re-evaluated prior to induction Oxygen Delivery Method: Circle system utilized Preoxygenation: Pre-oxygenation with 100% oxygen Induction Type: IV induction Ventilation: Mask ventilation without difficulty Laryngoscope Size: Miller and 3 Grade View: Grade II Tube type: Oral Tube size: 7.0 mm Number of attempts: 1 Airway Equipment and Method: Stylet Placement Confirmation: ETT inserted through vocal cords under direct vision Secured at: 22 cm Tube secured with: Tape Dental Injury: Teeth and Oropharynx as per pre-operative assessment

## 2021-07-23 NOTE — Anesthesia Postprocedure Evaluation (Signed)
Anesthesia Post Note  Patient: Kathryn Boyer  Procedure(s) Performed: OPEN REDUCTION INTERNAL FIXATION (ORIF) ANKLE FRACTURE (Left: Ankle)     Patient location during evaluation: PACU Anesthesia Type: General Level of consciousness: awake and alert Pain management: pain level controlled Vital Signs Assessment: post-procedure vital signs reviewed and stable Respiratory status: spontaneous breathing, nonlabored ventilation, respiratory function stable and patient connected to nasal cannula oxygen Cardiovascular status: blood pressure returned to baseline and stable Postop Assessment: no apparent nausea or vomiting Anesthetic complications: no   No notable events documented.  Last Vitals:  Vitals:   07/23/21 1306 07/23/21 1352  BP: 126/76 126/76  Pulse: 93 98  Resp: 19 16  Temp: (!) 36.3 C 36.8 C  SpO2: 94% 94%    Last Pain:  Vitals:   07/23/21 1914  TempSrc:   PainSc: 2                  Cecile Hearing

## 2021-07-23 NOTE — Op Note (Signed)
07/23/2021  4:05 PM  PATIENT:  Kathryn Boyer  Mar 05, 1956 female   MEDICAL RECORD NUMBER: [id number]  PRE-OPERATIVE DIAGNOSIS:   LEFT ANKLE TRIMALLEOLAR FRACTURE DISLOCATION LEFT ANKLE RUPTURED SYNDESMOSIS LEFT MEDIAL TIBIAL PLATEAU FRACTURE LEFT PROXIMAL HUMERUS 4 PART FRACTURE OSTEOPOROSIS  POST-OPERATIVE DIAGNOSIS:   LEFT ANKLE TRIMALLEOLAR FRACTURE DISLOCATION LEFT ANKLE RUPTURED SYNDESMOSIS LEFT MEDIAL TIBIAL PLATEAU FRACTURE LEFT PROXIMAL HUMERUS 4 PART FRACTURE OSTEOPOROSIS  PROCEDURE:   OPEN REDUCTION INTERNAL FIXATION OF LEFT TRIMALLEOLAR ANKLE FRACTURE WITH BUTTRESS PLATING (FIXATION) OF THE POSTERIOR LIP OPEN REDUCTION INTERNAL FIXATION OF THE SYNDESMOSIS MANUAL APPLICATION OF STRESS ANKLE SYNDESMOSIS UNDER FLUOROSCOPY CLOSED TREATMENT OF LEFT MEDIAL CONDYLE FRACTURE OF THE TIBIA 5. MANUAL APPLICATION OF STRESS LEFT KNEE MEDIAL CONDYLE FRACTURE UNDER FLUOROSCOPY  SURGEON:  Doralee Albino. Carola Frost, M.D.  ASSISTANT:  Montez Morita, PA-C.  ANESTHESIA:  General.  COMPLICATIONS:  None.  TOURNIQUET: None.  ESTIMATED BLOOD LOSS:  50 mL.  DISPOSITION:  To PACU.  CONDITION:  Stable.  DELAY START OF DVT PROPHYLAXIS BECAUSE OF BLEEDING RISK: NO  INDICATIONS FOR PROCEDURE:  The patient is a 65 year old female with a history of osteoporosis who sustained multiple injuries including right sided foot fracture as well in a ground level fall. I did discuss with her the risks and benefits of surgical treatment including the possibility of failure to obtain an anatomic reduction, nerve injury, vessel injury, DVT, PE, arthritis, loss of motion, and need for further surgery including possible removal of  syndesmotic screws as well as symptomatic hardware. We also discussed deferring the left shoulder to my colleague Dr. Ramond Marrow because of his expertise in arthoplasty. She did acknowledge these risks and wished to proceed.  BRIEF SUMMARY OF PROCEDURE:  The patient was taken to the  operating room where general anesthesia was induced.  She was then positioned in the so called lazy lateral position. She did receive a preoperative regional block. Standard chlorhexidine wash Betadine scrub and  paint was performed and then timeout held after a standard drape.  We made a 15 cm incision a fingerbreadth posterior to the posterior cortex of the fibula, carried dissection carefully down to the peroneal tendon sheath, which was released, allowing  [place] to mobilize the peroneal tendons posterior to access the fibula laterally or laterally to access the posterior aspect of the tibia.  Fibula fracture site was identified and cleaned with a curette removing intervening  hematoma.  I was able to pull this down and obtained an anatomic reduction provisionally with a clamp and posterior fibula plate from [employer].  I then continued the dissection with the help of my assistant onto the posterior aspect of the tibia.  Here, the posterior malleolus fracture was identified and mobilized after cleaning with a curette and lavage.  I was unable to achieve an anatomic reduction without creating more bone injury and so accepted the small segment of articular depression. The buttress plate was secured with standard fixation.  At this point, I then turned my attention medially where a fracture blister was directly over the fracture site. Reduction appeared to be near anatomic and so decision made to proceed with placement of two cannulated screws. These were placed with C-arm guidance, with anterior 34 mm and posterior 32 mm.  It was clear as we stressed the ankle prior to this that the syndesmosis allowed for lateral translation of the talus. Using a new laterally placed 3 hole plate, two syndesmotic screws were placed and these were tricortical. The patient's bone was weaker in  quality than would be anticipated for age, but was consistent with our expectation given overall medical  condition and social history.   Thorough irrigation and a standard layer closure performed with application of a posterior and stirrup splint.  Montez Morita, PA-C, was present and assisting throughout this technically demanding case.  C-arm was then brought in and varus and valgus force applied with the knee in full extension and slight flexion. In neither case did manual application of stress result in gapping of the medial or lateral sides to indicate ligamentous or unstable bone injury.  PROGNOSIS:  The patient will need to be restricted weightbearing for the next 8 weeks with graduated weightbearing thereafter.  Anticipate removal of syndesmotic screws at 4 months depending upon progress. Pharmacologic DVT prophylaxis with Lovenox. WBAT on the RLE in post op shoe. Begin AROM o f left knee immediately.

## 2021-07-23 NOTE — Anesthesia Procedure Notes (Signed)
Anesthesia Regional Block: Popliteal block   Pre-Anesthetic Checklist: , timeout performed,  Correct Patient, Correct Site, Correct Laterality,  Correct Procedure, Correct Position, site marked,  Risks and benefits discussed,  Surgical consent,  Pre-op evaluation,  At surgeon's request and post-op pain management  Laterality: Left  Prep: chloraprep       Needles:  Injection technique: Single-shot  Needle Type: Echogenic Needle     Needle Length: 9cm  Needle Gauge: 21     Additional Needles:   Procedures:,,,, ultrasound used (permanent image in chart),,    Narrative:  Start time: 07/23/2021 8:23 AM End time: 07/23/2021 8:30 AM Injection made incrementally with aspirations every 5 mL.  Performed by: Personally  Anesthesiologist: Cecile Hearing, MD  Additional Notes: No pain on injection. No increased resistance to injection. Injection made in 5cc increments.  Good needle visualization.  Patient tolerated procedure well.

## 2021-07-23 NOTE — Progress Notes (Signed)
PROGRESS NOTE  Kathryn Boyer WNI:[id number] DOB: 03-Jun-1956   PCP: Donato Schultz, DO  Patient is from: Home.  Lives with family.  Independently ambulates at baseline.  DOA: 07/20/2021 LOS: 3  Chief complaints:  Chief Complaint  Patient presents with   Fall   Ankle Pain   Shoulder Injury     Brief Narrative / Interim history: 65 year old F with PMH of HTN, HLD, hypothyroidism and GERD, who presented to [facility] ED on 11/25 via EMS with complaint of left ankle and shoulder pain following fall.  Patient reports she was walking out of her house and tripped over some carpet cleaning hoses and fell onto her left side.  Denies any loss of conscious or head injury.  Patient was unable to mobilize following the fall and EMS was activated.    In the ED, multiple imaging studies notable for left humerus fracture, left tibial plateau fracture, right foot fifth metatarsal and lateral malleolus fracture, left foot trimalleolar ankle fracture, and left scaphoid deformity.  Orthopedics and hospitalist service consulted.   Patient underwent ORIF on 11/28 by Dr. Carola Frost for left ankle fracture.  Plan for left shoulder surgery on 11/30.  Subjective: Seen and examined earlier this afternoon after she returned from surgery.  Reports mild left shoulder pain.  She denies pain in her legs/foot.  Denies chest pain, dyspnea, GI or UTI symptoms.  Has not had bowel movement yet.  Objective: Vitals:   07/23/21 1236 07/23/21 1251 07/23/21 1306 07/23/21 1352  BP: 111/74 124/71 126/76 126/76  Pulse: 82 96 93 98  Resp: 17 19 19 16   Temp:   (!) 97.4 F (36.3 C) 98.3 F (36.8 C)  TempSrc:      SpO2: 92% 93% 94% 94%  Weight:      Height:        Intake/Output Summary (Last 24 hours) at 07/23/2021 1447 Last data filed at 07/23/2021 1306 Gross per 24 hour  Intake 2570 ml  Output 1375 ml  Net 1195 ml   Filed Weights   07/20/21 1034  Weight: 80.7 kg    Examination:  GENERAL: No apparent distress.   Nontoxic. HEENT: MMM.  Vision and hearing grossly intact.  NECK: Supple.  No apparent JVD.  RESP: 94% on 3 L.  No IWOB.  Fair aeration bilaterally. CVS:  RRR. Heart sounds normal.  ABD/GI/GU: BS+. Abd soft, NTND.  MSK/EXT: LUE in a sling.  Bulky dressing over LLE. SKIN: no apparent skin lesion or wound NEURO: Awake, alert and oriented appropriately.  No apparent focal neuro deficit. PSYCH: Calm. Normal affect.   Procedures:  11/28-ORIF of left ankle  Microbiology summarized: COVID-19 and influenza PCR nonreactive. MRSA PCR screen negative.  Assessment & Plan: Accidental fall with subsequent polytrauma Left trimalleolar fracture, closed, initial encounter Left Humerus fracture Left tibial plateau fracture  Nondisplaced fracture of fifth metatarsal bone, right foot, initial encounter for closed fracture Nondisplaced fracture of lateral malleolus of right fibula, initial encounter for closed fracture -S/p ORIF for left ankle on 11/28 -Left shoulder, NWB with sling.  Plan for surgery on 11/30. -RLE NWB with cam boot in place -Pain control and VTE prophylaxis per surgery. -Bowel regimen -Check vitamin D level in the morning  Essential hypertension: On olmesartan 40 mg daily at home. -Continue holding antihypertensive meds  HLD: Pravastatin 20 mg p.o. daily   Hypothyroidism: Levothyroxine 100 mcg p.o. daily.  Normotensive.   Generalized anxiety disorder -Valium 5 mg p.o. q6h PRN  Body mass index is  27.88 kg/m.         DVT prophylaxis:  enoxaparin (LOVENOX) injection 40 mg Start: 07/24/21 0800 SCDs Start: 07/23/21 1400  Code Status: Full code Family Communication: Updated patient's husband at bedside. Level of care: Med-Surg Status is: Inpatient  Remains inpatient appropriate because: Surgical intervention for polytrauma/fracture       Consultants:  Orthopedic surgery   Sch Meds:  Scheduled Meds:  acetaminophen  1,000 mg Oral Q8H   vitamin C  500 mg  Oral Daily   calcium citrate  200 mg of elemental calcium Oral BID   cholecalciferol  2,000 Units Oral BID   docusate sodium  100 mg Oral BID   [START ON 07/24/2021] enoxaparin (LOVENOX) injection  40 mg Subcutaneous Q24H   levothyroxine  100 mcg Oral Q0600   ondansetron (ZOFRAN) IV  4 mg Intravenous Once   pravastatin  20 mg Oral Daily   Continuous Infusions:  ceFAZolin      ceFAZolin (ANCEF) IV     lactated ringers 75 mL/hr at 07/23/21 1354   PRN Meds:.[START ON 07/24/2021] acetaminophen, diazepam, HYDROmorphone (DILAUDID) injection, magnesium hydroxide, metoCLOPramide **OR** metoCLOPramide (REGLAN) injection, metoprolol tartrate, naLOXone (NARCAN)  injection, ondansetron **OR** ondansetron (ZOFRAN) IV, oxyCODONE, sodium phosphate, sorbitol  Antimicrobials: Anti-infectives (From admission, onward)    Start     Dose/Rate Route Frequency Ordered Stop   07/23/21 1445  ceFAZolin (ANCEF) IVPB 1 g/50 mL premix        1 g 100 mL/hr over 30 Minutes Intravenous Every 6 hours 07/23/21 1359 07/24/21 0844   07/23/21 0812  ceFAZolin (ANCEF) 2-4 GM/100ML-% IVPB       Note to Pharmacy: Tawanna Sat   : cabinet override      07/23/21 0812 07/23/21 2029        I have personally reviewed the following labs and images: CBC: Recent Labs  Lab 07/20/21 1131 07/21/21 0323 07/23/21 0338  WBC 14.0* 11.0* 7.6  NEUTROABS 11.3*  --   --   HGB 14.7 13.2 11.2*  HCT 43.4 40.2 33.2*  MCV 91.2 93.9 91.0  PLT 239 190 165   BMP &GFR Recent Labs  Lab 07/20/21 1131 07/21/21 0323 07/23/21 0338  NA 136 135 134*  K 3.9 4.3 4.2  CL 105 104 102  CO2 22 22 24   GLUCOSE 145* 125* 106*  BUN 12 12 8   CREATININE 0.86 0.93 0.81  CALCIUM 8.8* 8.6* 8.4*   Estimated Creatinine Clearance: 74.6 mL/min (by C-G formula based on SCr of 0.81 mg/dL). Liver & Pancreas: Recent Labs  Lab 07/21/21 0323  AST 27  ALT 22  ALKPHOS 67  BILITOT 0.8  PROT 6.6  ALBUMIN 3.9   No results for input(s): LIPASE,  AMYLASE in the last 168 hours. No results for input(s): AMMONIA in the last 168 hours. Diabetic: No results for input(s): HGBA1C in the last 72 hours. No results for input(s): GLUCAP in the last 168 hours. Cardiac Enzymes: No results for input(s): CKTOTAL, CKMB, CKMBINDEX, TROPONINI in the last 168 hours. No results for input(s): PROBNP in the last 8760 hours. Coagulation Profile: No results for input(s): INR, PROTIME in the last 168 hours. Thyroid Function Tests: No results for input(s): TSH, T4TOTAL, FREET4, T3FREE, THYROIDAB in the last 72 hours. Lipid Profile: No results for input(s): CHOL, HDL, LDLCALC, TRIG, CHOLHDL, LDLDIRECT in the last 72 hours. Anemia Panel: No results for input(s): VITAMINB12, FOLATE, FERRITIN, TIBC, IRON, RETICCTPCT in the last 72 hours. Urine analysis:    Component Value  Date/Time   GLUCOSEU Negative 04/29/2019 1036   BILIRUBINUR negative 04/29/2019 1036   PROTEINUR negative 09/22/2017 1106   UROBILINOGEN 0.2 04/29/2019 1036   NITRITE negative 04/29/2019 1036   LEUKOCYTESUR Negative 04/29/2019 1036   Sepsis Labs: Invalid input(s): PROCALCITONIN, LACTICIDVEN  Microbiology: Recent Results (from the past 240 hour(s))  Resp Panel by RT-PCR (Flu A&B, Covid) Nasopharyngeal Swab     Status: None   Collection Time: 07/20/21 11:31 AM   Specimen: Nasopharyngeal Swab; Nasopharyngeal(NP) swabs in vial transport medium  Result Value Ref Range Status   SARS Coronavirus 2 by RT PCR NEGATIVE NEGATIVE Final    Comment: (NOTE) SARS-CoV-2 target nucleic acids are NOT DETECTED.  The SARS-CoV-2 RNA is generally detectable in upper respiratory specimens during the acute phase of infection. The lowest concentration of SARS-CoV-2 viral copies this assay can detect is 138 copies/mL. A negative result does not preclude SARS-Cov-2 infection and should not be used as the sole basis for treatment or other patient management decisions. A negative result may occur with   improper specimen collection/handling, submission of specimen other than nasopharyngeal swab, presence of viral mutation(s) within the areas targeted by this assay, and inadequate number of viral copies(<138 copies/mL). A negative result must be combined with clinical observations, patient history, and epidemiological information. The expected result is Negative.  Fact Sheet for Patients:  BloggerCourse.com  Fact Sheet for Healthcare Providers:  SeriousBroker.it  This test is no t yet approved or cleared by the [place] FDA and  has been authorized for detection and/or diagnosis of SARS-CoV-2 by FDA under an Emergency Use Authorization (EUA). This EUA will remain  in effect (meaning this test can be used) for the duration of the COVID-19 declaration under Section 564(b)(1) of the Act, 21 U.S.C.section 360bbb-3(b)(1), unless the authorization is terminated  or revoked sooner.       Influenza A by PCR NEGATIVE NEGATIVE Final   Influenza B by PCR NEGATIVE NEGATIVE Final    Comment: (NOTE) The Xpert Xpress SARS-CoV-2/FLU/RSV plus assay is intended as an aid in the diagnosis of influenza from Nasopharyngeal swab specimens and should not be used as a sole basis for treatment. Nasal washings and aspirates are unacceptable for Xpert Xpress SARS-CoV-2/FLU/RSV testing.  Fact Sheet for Patients: BloggerCourse.com  Fact Sheet for Healthcare Providers: SeriousBroker.it  This test is not yet approved or cleared by the [place] FDA and has been authorized for detection and/or diagnosis of SARS-CoV-2 by FDA under an Emergency Use Authorization (EUA). This EUA will remain in effect (meaning this test can be used) for the duration of the COVID-19 declaration under Section 564(b)(1) of the Act, 21 U.S.C. section 360bbb-3(b)(1), unless the authorization is terminated  or revoked.  Performed at [facility], [address] [place], [place] [address]   Surgical pcr screen     Status: None   Collection Time: 07/23/21  7:45 AM   Specimen: Nasal Mucosa; Nasal Swab  Result Value Ref Range Status   MRSA, PCR NEGATIVE NEGATIVE Final   Staphylococcus aureus NEGATIVE NEGATIVE Final    Comment: (NOTE) The Xpert SA Assay (FDA approved for NASAL specimens in patients 51 years of age and older), is one component of a comprehensive surveillance program. It is not intended to diagnose infection nor to guide or monitor treatment. Performed at [facility], [address], [place], [place] [address]     Radiology Studies: DG Knee 1-2 Views Left  Result Date: 07/23/2021 CLINICAL DATA:  ORIF  for ankle fracture. EXAM: LEFT KNEE - 1-2 VIEW COMPARISON:  None. FINDINGS: 2 intraoperative spot fluoro films obtained at rest and during stress to evaluate the left knee. No gross bony abnormality. IMPRESSION: Intraoperative assessment. Electronically Signed   By: Kennith Center M.D.   On: 07/23/2021 12:03   DG Ankle Complete Left  Result Date: 07/23/2021 CLINICAL DATA:  Left ankle fracture or dislocation. EXAM: LEFT ANKLE COMPLETE - 3+ VIEW COMPARISON:  Fluoroscopic images of same day. FINDINGS: The left ankle has been casted and immobilized. Patient is status post surgical internal fixation of distal left tibial and fibular fractures. IMPRESSION: Status post surgical internal fixation of distal left fibular and tibial fractures. Electronically Signed   By: Lupita Raider M.D.   On: 07/23/2021 14:41   DG Ankle Complete Left  Result Date: 07/23/2021 CLINICAL DATA:  Open reduction internal fixation. EXAM: OPERATIVE left ankle 4 VIEWS TECHNIQUE: Fluoroscopic spot image(s) were submitted for interpretation post-operatively. If the device does not provide the exposure index: Fluoroscopy Time (in minutes and seconds):  59.8 seconds Number of Acquired  Images:  4 COMPARISON:  Left ankle in CT and radiographs 07/20/2021. FINDINGS: Lateral plate and screw fixation of the distal fibula noted. Posterior tibial plate screw fixation is present. Two lag screws are evident at the medial malleolus. The ankle is located. No new fractures are present. IMPRESSION: ORIF trimalleolar fracture. No radiographic evidence for complication. Electronically Signed   By: Marin Roberts M.D.   On: 07/23/2021 11:33   DG C-Arm 1-60 Min-No Report  Result Date: 07/23/2021 Fluoroscopy was utilized by the requesting physician.  No radiographic interpretation.   DG C-Arm 1-60 Min-No Report  Result Date: 07/23/2021 Fluoroscopy was utilized by the requesting physician.  No radiographic interpretation.       Kathryn Boyer T. Debe Anfinson [facility]  If 7PM-7AM, please contact night-coverage [email or web address] 07/23/2021, 2:47 PM

## 2021-07-23 NOTE — Transfer of Care (Signed)
Immediate Anesthesia Transfer of Care Note  Patient: Kathryn Boyer  Procedure(s) Performed: OPEN REDUCTION INTERNAL FIXATION (ORIF) ANKLE FRACTURE (Left: Ankle)  Patient Location: PACU  Anesthesia Type:GA combined with regional for post-op pain  Level of Consciousness: drowsy and patient cooperative  Airway & Oxygen Therapy: Patient Spontanous Breathing and Patient connected to face mask oxygen  Post-op Assessment: Report given to RN and Post -op Vital signs reviewed and stable  Post vital signs: Reviewed and stable  Last Vitals:  Vitals Value Taken Time  BP 98/49 07/23/21 1136  Temp    Pulse 76 07/23/21 1138  Resp 18 07/23/21 1138  SpO2 95 % 07/23/21 1138  Vitals shown include unvalidated device data.  Last Pain:  Vitals:   07/23/21 0751  TempSrc: Oral  PainSc:       Patients Stated Pain Goal: 3 (07/22/21 1752)  Complications: No notable events documented.

## 2021-07-23 NOTE — Progress Notes (Signed)
07/23/2021 foley was removed at 1700 patient had 1300 of clear yellow urine. [facility] RN.

## 2021-07-23 NOTE — Anesthesia Preprocedure Evaluation (Addendum)
Anesthesia Evaluation  Patient identified by MRN, date of birth, ID band Patient awake    Reviewed: Allergy & Precautions, NPO status , Patient's Chart, lab work & pertinent test results  Airway Mallampati: II  TM Distance: >3 FB Neck ROM: Full    Dental  (+) Teeth Intact, Dental Advisory Given   Pulmonary asthma ,    Pulmonary exam normal breath sounds clear to auscultation       Cardiovascular hypertension, Normal cardiovascular exam Rhythm:Regular Rate:Normal     Neuro/Psych PSYCHIATRIC DISORDERS Anxiety  Neuromuscular disease    GI/Hepatic Neg liver ROS, GERD  ,  Endo/Other  Hypothyroidism   Renal/GU negative Renal ROS     Musculoskeletal negative musculoskeletal ROS (+) Ankle Fx Left Left proximal humerus fracture    Abdominal   Peds  Hematology  (+) Blood dyscrasia, anemia ,   Anesthesia Other Findings Day of surgery medications reviewed with the patient.  Reproductive/Obstetrics                           Anesthesia Physical Anesthesia Plan  ASA: 2  Anesthesia Plan: General   Post-op Pain Management: Regional block   Induction: Intravenous  PONV Risk Score and Plan: 3 and Midazolam, Dexamethasone and Ondansetron  Airway Management Planned: Oral ETT  Additional Equipment:   Intra-op Plan:   Post-operative Plan: Extubation in OR  Informed Consent: I have reviewed the patients History and Physical, chart, labs and discussed the procedure including the risks, benefits and alternatives for the proposed anesthesia with the patient or authorized representative who has indicated his/her understanding and acceptance.     Dental advisory given  Plan Discussed with: CRNA  Anesthesia Plan Comments:        Anesthesia Quick Evaluation

## 2021-07-23 NOTE — Anesthesia Procedure Notes (Signed)
Anesthesia Regional Block: Adductor canal block   Pre-Anesthetic Checklist: , timeout performed,  Correct Patient, Correct Site, Correct Laterality,  Correct Procedure, Correct Position, site marked,  Risks and benefits discussed,  Surgical consent,  Pre-op evaluation,  At surgeon's request and post-op pain management  Laterality: Left  Prep: chloraprep       Needles:  Injection technique: Single-shot  Needle Type: Echogenic Needle     Needle Length: 9cm  Needle Gauge: 21     Additional Needles:   Procedures:,,,, ultrasound used (permanent image in chart),,    Narrative:  Start time: 07/23/2021 8:30 AM End time: 07/23/2021 8:33 AM Injection made incrementally with aspirations every 5 mL.  Performed by: Personally  Anesthesiologist: Cecile Hearing, MD  Additional Notes: No pain on injection. No increased resistance to injection. Injection made in 5cc increments.  Good needle visualization.  Patient tolerated procedure well.

## 2021-07-23 NOTE — Progress Notes (Signed)
OT Cancellation Note  Patient Details Name: Kathryn Boyer MRN: [id number] DOB: 13-Nov-1955   Cancelled Treatment:    Reason Eval/Treat Not Completed: Patient at procedure or test/ unavailable. Pt having surgery today. Will follow up as able.  Hillard Danker, OT/L  Acute Rehab [phone]  Lenice Llamas 07/23/2021, 11:23 AM

## 2021-07-23 NOTE — Consult Note (Signed)
Orthopaedic Trauma Service (OTS) Consultation   Patient ID: Glorine Gerardo MRN: [id number] DOB/AGE: 1955/10/06 65 y.o.   Reason for Consult: fall with multiple fractures Referring Physician: Charlies Constable, MD  HPI: Lizann Dool is an 65 y.o. female with severe osteoporosis who fell sustaining multiple fractures, left shoulder, left knee, left ankle with associated dislocation, and right foot fractures. Has been in an osteoporosis program. Somewhat active but no sports. Pain moderate severe, aching, worse with movement. No associated numbness. Given the location and complexity of her multiple fractures, Dr. Zachery Dakins asserted this was outside his scope of practice and that it would be in the best interest of the patient to have these injuries evaluated and treated by a fellowship trained orthopaedic traumatologist. Consequently, I was consulted to provide further evaluation and management.   Past Medical History:  Diagnosis Date   Anxiety    Asthma    Hyperlipidemia    Hypertension    Plantar fasciitis    Thyroid disease     Past Surgical History:  Procedure Laterality Date   ABDOMINAL HYSTERECTOMY     BREAST SURGERY     NOSE SURGERY      Family History  Problem Relation Age of Onset   Heart disease Mother    Stroke Mother    Hypertension Mother    Cancer Mother        ovarian   Aneurysm Father    Hypertension Father    Heart disease Father    Kidney disease Father    Heart disease Sister     Social History:  reports that she has never smoked. She has never used smokeless tobacco. She reports that she does not drink alcohol and does not use drugs.  Allergies:  Allergies  Allergen Reactions   Contrast Media [Iodinated Diagnostic Agents] Hives   Isovue [Iopamidol] Hives    After returning home pt called to inform technologist of rash to face and neck, denies difficulty breathing or swallowing, pt took 2 benedryl Will need premeds in future    Amoxicillin Other (See Comments)    Headache    Belviq Xr [Lorcaserin Hcl Er] Itching    Caused her face to turn red.   Saxenda [Liraglutide -Weight Management] Other (See Comments)    Caused indigestion.   Penicillins Rash    Medications: Prior to Admission:  Medications Prior to Admission  Medication Sig Dispense Refill Last Dose   ALPRAZolam (XANAX) 0.25 MG tablet Take 1 tablet (0.25 mg total) by mouth daily as needed. (Patient taking differently: Take 0.25 mg by mouth daily as needed for anxiety.) 30 tablet 1 Past Week   levothyroxine (SYNTHROID) 100 MCG tablet TAKE 1 TABLET BY MOUTH EVERY DAY (Patient taking differently: Take 100 mcg by mouth daily before breakfast.) 90 tablet 1 07/20/2021   olmesartan (BENICAR) 40 MG tablet TAKE 1 TABLET(40 MG) BY MOUTH DAILY (Patient taking differently: Take 40 mg by mouth daily.) 90 tablet 1 07/19/2021   pravastatin (PRAVACHOL) 20 MG tablet TAKE 1 TABLET(20 MG) BY MOUTH DAILY (Patient taking differently: Take 20 mg by mouth daily.) 90 tablet 1 07/19/2021   cyclobenzaprine (FLEXERIL) 5 MG tablet Take 1 tablet (5 mg total) by mouth 3 (three) times daily as needed for muscle spasms. (Patient not taking: Reported on 07/20/2021) 30 tablet 1 Not Taking   estradiol (ESTRACE) 0.1 MG/GM vaginal cream Place 1 Applicatorful vaginally 3 (three) times a week. (Patient not taking: Reported on 07/20/2021) 42.5 g 3 Not  Taking   estradiol (VIVELLE-DOT) 0.025 MG/24HR Place 1 patch onto the skin 2 (two) times a week. (Patient not taking: Reported on 07/20/2021) 24 patch 5 Not Taking   famotidine (PEPCID) 20 MG tablet TAKE 1 TABLET BY MOUTH TWICE DAILY (Patient not taking: Reported on 07/20/2021) 180 tablet 1 Not Taking   flunisolide (NASALIDE) 25 MCG/ACT (0.025%) SOLN Place 2 sprays into the nose 2 (two) times daily. (Patient not taking: Reported on 07/20/2021) 1 Bottle 5 Not Taking   fluticasone (FLOVENT HFA) 110 MCG/ACT inhaler Inhale 2 puffs into the lungs 2 (two)  times daily. (Patient not taking: Reported on 07/20/2021) 1 Inhaler 5 Not Taking   montelukast (SINGULAIR) 10 MG tablet Take 1 tablet (10 mg total) by mouth at bedtime. (Patient not taking: Reported on 07/20/2021) 30 tablet 5 Not Taking   risedronate (ACTONEL) 35 MG tablet TAKE 1 TAB BY MOUTH EVERY 7 DAYS WITH WATER ON EMPTY STOMACH, NOTHING BY MOUTH OR LIE DOWN FOR NEXT 30 MINUTES. (Patient not taking: Reported on 03/06/2021) 12 tablet 1 Not Taking   VENTOLIN HFA 108 (90 Base) MCG/ACT inhaler Inhale 2 puffs into the lungs every 4 (four) hours as needed for wheezing or shortness of breath. (Patient not taking: Reported on 07/20/2021) 18 g 1 Not Taking   Vitamin D, Ergocalciferol, (DRISDOL) 1.25 MG (50000 UT) CAPS capsule TAKE 1 CAPSULE (50,000 UNITS TOTAL) BY MOUTH EVERY 7 (SEVEN) DAYS. (Patient not taking: Reported on 03/06/2021) 12 capsule 1 Not Taking    Results for orders placed or performed during the hospital encounter of 07/20/21 (from the past 48 hour(s))  CBC     Status: Abnormal   Collection Time: 07/23/21  3:38 AM  Result Value Ref Range   WBC 7.6 4.0 - 10.5 K/uL   RBC 3.65 (L) 3.87 - 5.11 MIL/uL   Hemoglobin 11.2 (L) 12.0 - 15.0 g/dL   HCT 33.2 (L) 36.0 - 46.0 %   MCV 91.0 80.0 - 100.0 fL   MCH 30.7 26.0 - 34.0 pg   MCHC 33.7 30.0 - 36.0 g/dL   RDW 12.1 11.5 - 15.5 %   Platelets 165 150 - 400 K/uL   nRBC 0.0 0.0 - 0.2 %    Comment: Performed at [facility], [place] [address], [place], [place] [id number]  Basic metabolic panel     Status: Abnormal   Collection Time: 07/23/21  3:38 AM  Result Value Ref Range   Sodium 134 (L) 135 - 145 mmol/L   Potassium 4.2 3.5 - 5.1 mmol/L   Chloride 102 98 - 111 mmol/L   CO2 24 22 - 32 mmol/L   Glucose, Bld 106 (H) 70 - 99 mg/dL    Comment: Glucose reference range applies only to samples taken after fasting for at least 8 hours.   BUN 8 8 - 23 mg/dL   Creatinine, Ser 0.81 0.44 - 1.00 mg/dL   Calcium 8.4 (L) 8.9 - 10.3 mg/dL   GFR,  Estimated >60 >60 mL/min    Comment: (NOTE) Calculated using the CKD-EPI Creatinine Equation (2021)    Anion gap 8 5 - 15    Comment: Performed at [facility] [address], [place], [place] [address]    CT SHOULDER LEFT WO CONTRAST  Result Date: 07/21/2021 CLINICAL DATA:  Fall yesterday. Left humeral neck fracture. Preoperative planning. EXAM: CT OF THE UPPER LEFT EXTREMITY WITHOUT CONTRAST TECHNIQUE: Multidetector CT imaging of the left shoulder was performed according to the standard protocol.  COMPARISON:  Radiographs 07/20/2021 FINDINGS: Bones/Joint/Cartilage The bones are demineralized. Comminuted fracture of the left humeral neck demonstrates impaction, apex anterior angulation and up to 6 mm of anterior displacement. This fracture involves both tuberosities as well as the superior articular surface of the humeral head. The humeral head remains located. No evidence of glenoid or other scapular fracture. There is a moderate size hemarthrosis. Mild acromioclavicular degenerative changes are present. Ligaments Suboptimally assessed by CT. Muscles and Tendons Grossly intact rotator cuff without evidence of focal muscular atrophy. There is some edema anteriorly in the deltoid muscle without evidence of focal hematoma. Soft tissues Subcutaneous edema anteriorly and laterally consistent with contusion. No evidence of foreign body or soft tissue emphysema. Mild left lung emphysematous changes are noted. IMPRESSION: 1. Comminuted and displaced fracture of the left humeral neck as described. This fracture involves both tuberosities as well as the superior articular surface of the humeral head. 2. No evidence of glenoid or other scapular fracture. 3. Anterior soft tissue contusion without evidence of focal hematoma. Electronically Signed   By: Carey Bullocks M.D.   On: 07/21/2021 10:06    Intake/Output      11/27 0701 11/28 0700 11/28 0701 11/29 0700   P.O. 240    I.V. (mL/kg) 0 (0)    Total  Intake(mL/kg) 240 (3)    Urine (mL/kg/hr) 500 (0.3)    Stool 0    Total Output 500    Net -260         Stool Occurrence 0 x       ROS No recent fever, bleeding abnormalities, urologic dysfunction, GI problems, or weight gain.  Blood pressure (!) 127/50, pulse 92, temperature 98 F (36.7 C), temperature source Oral, resp. rate 18, height 5\' 7"  (1.702 m), weight 80.7 kg, SpO2 90 %. Physical Exam NCAT LUEx   Sling in place  Sens  Ax/R/M/U intact  Mot   Ax/ R/ PIN/ M/ AIN/ U intact  Brisk CR LLE Splint and knee immobilizer in place  Edema/ swelling controlled  Sens: DPN, SPN, TN intact  Motor: EHL, FHL, and lessor toe ext and flex all intact grossly  Brisk cap refill, warm to touch RLE Cam boot in place  Edema/ swelling controlled  Sens: DPN, SPN, TN intact  Motor: EHL, FHL, and lessor toe ext and flex all intact grossly  Brisk cap refill, warm to touch  Assessment/Plan:  Left proximal humerus will require arthroplasty by one of shoulder specialist colleagues, Dr. Left knee to be examined for stability and treated nonoperatively if stable Left trimall and syndesmosis for ORIF; left talus for excision of fragment Right proximal 5th metatarsal nonop treatment  I discussed with the patient the risks and benefits of surgery, including the possibility of infection, nerve injury, vessel injury, wound breakdown, arthritis, symptomatic hardware, DVT/ PE, loss of motion, malunion, nonunion, and need for further surgery among others.  We also specifically discussed the need to stage the left shoulder with my colleague.  She acknowledged these risks and wished to proceed.    Ramond Marrow, MD [facility], [facility] [phone]  07/23/2021, 8:37 AM  [facility] [address] [place] [phone] (F)    After 5pm and on the weekends please log on to Amion, go to orthopaedics and the look under the  [facility] Call for the provider(s) on call. You can also call our office at [phone] and then follow the prompts to be connected  to the call team.

## 2021-07-24 ENCOUNTER — Encounter (HOSPITAL_COMMUNITY): Payer: Self-pay | Admitting: Orthopedic Surgery

## 2021-07-24 ENCOUNTER — Other Ambulatory Visit: Payer: Self-pay | Admitting: Family Medicine

## 2021-07-24 DIAGNOSIS — M8000XA Age-related osteoporosis with current pathological fracture, unspecified site, initial encounter for fracture: Secondary | ICD-10-CM

## 2021-07-24 LAB — COMPREHENSIVE METABOLIC PANEL
ALT: 19 U/L (ref 0–44)
AST: 26 U/L (ref 15–41)
Albumin: 3 g/dL — ABNORMAL LOW (ref 3.5–5.0)
Alkaline Phosphatase: 63 U/L (ref 38–126)
Anion gap: 7 (ref 5–15)
BUN: 10 mg/dL (ref 8–23)
CO2: 26 mmol/L (ref 22–32)
Calcium: 9 mg/dL (ref 8.9–10.3)
Chloride: 106 mmol/L (ref 98–111)
Creatinine, Ser: 0.77 mg/dL (ref 0.44–1.00)
GFR, Estimated: >60 mL/min (ref >60)
Glucose, Bld: 122 mg/dL — ABNORMAL HIGH (ref 70–99)
Potassium: 4.1 mmol/L (ref 3.5–5.1)
Sodium: 139 mmol/L (ref 135–145)
Total Bilirubin: 0.8 mg/dL (ref 0.3–1.2)
Total Protein: 5.9 g/dL — ABNORMAL LOW (ref 6.5–8.1)

## 2021-07-24 LAB — CBC
HCT: 31 % — ABNORMAL LOW (ref 36.0–46.0)
Hemoglobin: 10.4 g/dL — ABNORMAL LOW (ref 12.0–15.0)
MCH: 30.4 pg (ref 26.0–34.0)
MCHC: 33.5 g/dL (ref 30.0–36.0)
MCV: 90.6 fL (ref 80.0–100.0)
Platelets: 211 10*3/uL (ref 150–400)
RBC: 3.42 MIL/uL — ABNORMAL LOW (ref 3.87–5.11)
RDW: 11.9 % (ref 11.5–15.5)
WBC: 9.7 10*3/uL (ref 4.0–10.5)
nRBC: 0 % (ref 0.0–0.2)

## 2021-07-24 LAB — TSH: TSH: 0.4 u[IU]/mL (ref 0.350–4.500)

## 2021-07-24 LAB — VITAMIN D 25 HYDROXY (VIT D DEFICIENCY, FRACTURES): Vit D, 25-Hydroxy: 39.79 ng/mL (ref 30–100)

## 2021-07-24 MED ORDER — METHOCARBAMOL 500 MG PO TABS
ORAL_TABLET | ORAL | Status: AC
  Filled 2021-07-24: qty 1

## 2021-07-24 MED ORDER — CEFAZOLIN SODIUM-DEXTROSE 2-4 GM/100ML-% IV SOLN
2.0000 g | Freq: Once | INTRAVENOUS | Status: AC
  Administered 2021-07-25: 2 g via INTRAVENOUS
  Filled 2021-07-24: qty 100

## 2021-07-24 MED ORDER — METHOCARBAMOL 750 MG PO TABS
ORAL_TABLET | ORAL | Status: AC
  Filled 2021-07-24: qty 1

## 2021-07-24 MED ORDER — METHOCARBAMOL 750 MG PO TABS
750.0000 mg | ORAL_TABLET | Freq: Three times a day (TID) | ORAL | Status: DC
  Administered 2021-07-24 – 2021-07-27 (×10): 750 mg via ORAL
  Filled 2021-07-24 (×7): qty 1

## 2021-07-24 MED ORDER — POVIDONE-IODINE 10 % EX SWAB
2.0000 "application " | Freq: Once | CUTANEOUS | Status: AC
  Administered 2021-07-25: 2 via TOPICAL

## 2021-07-24 MED ORDER — CHLORHEXIDINE GLUCONATE 4 % EX LIQD
60.0000 mL | Freq: Once | CUTANEOUS | Status: AC
  Administered 2021-07-25: 4 via TOPICAL
  Filled 2021-07-24: qty 60

## 2021-07-24 MED ORDER — CEFAZOLIN SODIUM-DEXTROSE 2-4 GM/100ML-% IV SOLN
2.0000 g | INTRAVENOUS | Status: DC
  Filled 2021-07-24: qty 100

## 2021-07-24 NOTE — Evaluation (Signed)
Physical Therapy Evaluation Patient Details Name: Kathryn Boyer MRN: [id number] DOB: [birth year] Today's Date: 07/24/2021  History of Present Illness  Patient is a 65 y/o female who presents 11/25 to [facility] with multiple fxs s/p fall - left humerus fx, right foot/lateral malleolus fx and left tibial plateau fx now s/p ORIF LLE 11/28 and plan for surgery on left shoulder 11/30. PMH includes osteoporosis, HTN, anxiety.  Clinical Impression  Patient presents with pain, post surgical deficits, impaired sensation and impaired mobility s/p above. Pt independent and works in [employer]. Today, pt tolerated bed mobility and transfers with Min A. Able to perform lateral scoot transfer into chair with assist and cues for technique. Education re: positioning, sling, AROM, there ex, disposition. Pt wants to be able to return home if possible pending progress with therapy. Pt reports MD mentioned ordering a CAM boot instead of post op shoe as this does not have good traction. Also will assess gait training post surgery tomorrow. Will need to see if pt can use platform RW and WB thru elbow to help progress mobility. Would benefit from intensive rehab to maximize independence and mobility prior to return home pending progress post surgery tomorrow. Will follow acutely.       Recommendations for follow up therapy are one component of a multi-disciplinary discharge planning process, led by the attending physician.  Recommendations may be updated based on patient status, additional functional criteria and insurance authorization.  Follow Up Recommendations Acute inpatient rehab (3hours/day)    Assistance Recommended at Discharge Frequent or constant Supervision/Assistance  Functional Status Assessment Patient has had a recent decline in their functional status and demonstrates the ability to make significant improvements in function in a reasonable and predictable amount of time.  Equipment Recommendations   BSC/3in1 (pt to check if she has  RW and w/c)    Recommendations for Other Services       Precautions / Restrictions Precautions Precautions: Fall Required Braces or Orthoses: Sling;Other Brace Other Brace: post op shoe RLE, WBAT Restrictions Weight Bearing Restrictions: Yes LUE Weight Bearing: Non weight bearing RLE Weight Bearing: Weight bearing as tolerated LLE Weight Bearing: Non weight bearing Other Position/Activity Restrictions: No restrictions knee ROM on left      Mobility  Bed Mobility Overal bed mobility: Needs Assistance Bed Mobility: Supine to Sit     Supine to sit: HOB elevated;Min assist     General bed mobility comments: ABle to come into long sitting without assist, Min A to scoot bottom towards EOB.    Transfers Overall transfer level: Needs assistance Equipment used: None Transfers: Bed to chair/wheelchair/BSC            Lateral/Scoot Transfers: Min assist General transfer comment: Min A to scoot into drop arm recliner towards right with cues for technique; able to maintain NWB on left side. Requires mutliple scoots using chuck pad. Difficulty with post op shoe due to poor traction on bottom.    Ambulation/Gait               General Gait Details: Deferred, will await post surgery and clarify if pt can use platform RW  Stairs            Wheelchair Mobility    Modified Rankin (Stroke Patients Only)       Balance Overall balance assessment: Needs assistance Sitting-balance support: Feet supported;No upper extremity supported Sitting balance-Leahy Scale: Good  Pertinent Vitals/Pain Pain Assessment: Faces Faces Pain Scale: Hurts whole lot Pain Location: left shoulder, right foot, LLE Pain Descriptors / Indicators: Operative site guarding;Sore;Aching;Cramping;Grimacing;Guarding Pain Intervention(s): Monitored during session;Patient requesting pain meds-RN notified;Limited  activity within patient's tolerance;Repositioned;Ice applied    Home Living Family/patient expects to be discharged to:: Private residence Living Arrangements: Spouse/significant other Available Help at Discharge: Family Type of Home: House Home Access: Stairs to enter Entrance Stairs-Rails: None Entrance Stairs-Number of Steps: 3 + 3   Home Layout: Two level;Able to live on main level with bedroom/bathroom Home Equipment: Wheelchair - Forensic psychologist (2 wheels)      Prior Function Prior Level of Function : Independent/Modified Independent             Mobility Comments: 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 Goes to clinic for osteoporosis once /week       Hand Dominance   Dominant Hand: Right    Extremity/Trunk Assessment   Upper Extremity Assessment Upper Extremity Assessment: Defer to OT evaluation    Lower Extremity Assessment Lower Extremity Assessment: RLE deficits/detail;LLE deficits/detail RLE Deficits / Details: swelling and discoloration around ankle, limited ankle AROM. RLE Sensation: WNL RLE Coordination: WNL LLE Deficits / Details: ABle to wiggle toes but they are numb, good AROM at knee/hip LLE Sensation: decreased light touch (toes)    Cervical / Trunk Assessment Cervical / Trunk Assessment: Normal  Communication   Communication: No difficulties  Cognition Arousal/Alertness: Awake/alert Behavior During Therapy: WFL for tasks assessed/performed Overall Cognitive Status: Within Functional Limits for tasks assessed                                          General Comments General comments (skin integrity, edema, etc.): Swelling right ankle/foot. Adjusted sling.    Exercises General Exercises - Lower Extremity Ankle Circles/Pumps: AROM;Right;10 reps;Supine Quad Sets: AROM;Both;10 reps;Supine Heel Slides: AROM;Left;10 reps;Seated   Assessment/Plan    PT Assessment Patient needs continued PT services  PT Problem List  Decreased range of motion;Decreased mobility;Decreased strength;Decreased knowledge of precautions;Decreased skin integrity;Pain;Impaired sensation;Decreased knowledge of use of DME       PT Treatment Interventions Balance training;Wheelchair mobility training;Patient/family education;Therapeutic exercise;Gait training;DME instruction;Therapeutic activities;Stair training;Functional mobility training    PT Goals (Current goals can be found in the Care Plan section)  Acute Rehab PT Goals Patient Stated Goal: to go home instead of rehab if possible PT Goal Formulation: With patient Time For Goal Achievement: 08/07/21 Potential to Achieve Goals: Fair    Frequency Min 5X/week   Barriers to discharge Inaccessible home environment stairs, spouse works(time off for holidays)    Co-evaluation               AM-PAC PT "6 Clicks" Mobility  Outcome Measure Help needed turning from your back to your side while in a flat bed without using bedrails?: A Little Help needed moving from lying on your back to sitting on the side of a flat bed without using bedrails?: A Little Help needed moving to and from a bed to a chair (including a wheelchair)?: A Little Help needed standing up from a chair using your arms (e.g., wheelchair or bedside chair)?: A Lot Help needed to walk in hospital room?: A Lot Help needed climbing 3-5 steps with a railing? : Total 6 Click Score: 14    End of Session Equipment Utilized During Treatment: Gait belt Activity Tolerance: Patient tolerated treatment  well Patient left: in chair;with call bell/phone within reach;with nursing/sitter in room;with chair alarm set Nurse Communication: Mobility status;Other (comment) (tx technique back to bed) PT Visit Diagnosis: Pain Pain - Right/Left: Left Pain - part of body: Shoulder;Leg    Time: 5364-6803 PT Time Calculation (min) (ACUTE ONLY): 36 min   Charges:   PT Evaluation $PT Eval Moderate Complexity: 1 Mod PT  Treatments $Therapeutic Activity: 8-22 mins        Vale Haven, PT, DPT Acute Rehabilitation Services Pager [phone] Office [phone]     Blake Divine A [facility] 07/24/2021, 10:23 AM

## 2021-07-24 NOTE — Progress Notes (Signed)
   ORTHOPAEDIC CONSULTATION  REQUESTING PHYSICIAN: Mike Handy  Chief Complaint: Multitrauma with left proximal humerus fracture  HPI: Kathryn Boyer is a 65 y.o. female with multiple trauma injury with a left head split proximal humerus fracture.  I was consulted for assistance with this complex fracture as it was deemed unreconstructable.  Patient is resting comfortably in her chair today.  She reports that her shoulder does cause her pain.  Past Medical History:  Diagnosis Date   Anxiety    Asthma    Hyperlipidemia    Hypertension    Plantar fasciitis    Thyroid disease    Past Surgical History:  Procedure Laterality Date   ABDOMINAL HYSTERECTOMY     BREAST SURGERY     NOSE SURGERY     ORIF ANKLE FRACTURE Left 07/23/2021   Procedure: OPEN REDUCTION INTERNAL FIXATION (ORIF) ANKLE FRACTURE;  Surgeon: Handy, Michael, MD;  Location: [facility] OR;  Service: Orthopedics;  Laterality: Left;   Social History   Socioeconomic History   Marital status: Married    Spouse name: Not on file   Number of children: Not on file   Years of education: Not on file   Highest education level: Not on file  Occupational History   Not on file  Tobacco Use   Smoking status: Never   Smokeless tobacco: Never  Vaping Use   Vaping Use: Never used  Substance and Sexual Activity   Alcohol use: No   Drug use: No   Sexual activity: Not on file  Other Topics Concern   Not on file  Social History Narrative   Exercise-sometimes   Social Determinants of Health   Financial Resource Strain: Not on file  Food Insecurity: Not on file  Transportation Needs: Not on file  Physical Activity: Not on file  Stress: Not on file  Social Connections: Not on file   Family History  Problem Relation Age of Onset   Heart disease Mother    Stroke Mother    Hypertension Mother    Cancer Mother        ovarian   Aneurysm Father    Hypertension Father    Heart disease Father    Kidney disease Father     Heart disease Sister    Allergies  Allergen Reactions   Contrast Media [Iodinated Diagnostic Agents] Hives   Isovue [Iopamidol] Hives    After returning home pt called to inform technologist of rash to face and neck, denies difficulty breathing or swallowing, pt took 2 benedryl Will need premeds in future   Amoxicillin Other (See Comments)    Headache  Cephalosporin tolerant 07/23/2021   Belviq Xr [Lorcaserin Hcl Er] Itching    Caused her face to turn red.   Saxenda [Liraglutide -Weight Management] Other (See Comments)    Caused indigestion.   Penicillins Rash    Cephalosporin Tolerant 07/23/2021    Prior to Admission medications   Medication Sig Start Date End Date Taking? Authorizing Provider  ALPRAZolam (XANAX) 0.25 MG tablet Take 1 tablet (0.25 mg total) by mouth daily as needed. Patient taking differently: Take 0.25 mg by mouth daily as needed for anxiety. 04/19/21  Yes Lowne Chase, Yvonne R, DO  levothyroxine (SYNTHROID) 100 MCG tablet TAKE 1 TABLET BY MOUTH EVERY DAY Patient taking differently: Take 100 mcg by mouth daily before breakfast. 04/23/21  Yes Lowne Chase, Yvonne R, DO  olmesartan (BENICAR) 40 MG tablet TAKE 1 TABLET(40 MG) BY MOUTH DAILY Patient taking differently: Take 40   mg by mouth daily. 05/10/21  Yes Roma Schanz R, DO  pravastatin (PRAVACHOL) 20 MG tablet TAKE 1 TABLET(20 MG) BY MOUTH DAILY Patient taking differently: Take 20 mg by mouth daily. 05/08/21  Yes Ann Held, DO  cyclobenzaprine (FLEXERIL) 5 MG tablet Take 1 tablet (5 mg total) by mouth 3 (three) times daily as needed for muscle spasms. Patient not taking: Reported on 07/20/2021 01/25/20   Debbrah Alar, NP  estradiol (ESTRACE) 0.1 MG/GM vaginal cream Place 1 Applicatorful vaginally 3 (three) times a week. Patient not taking: Reported on 07/20/2021 12/26/17   Carollee Herter, Alferd Apa, DO  estradiol (VIVELLE-DOT) 0.025 MG/24HR Place 1 patch onto the skin 2 (two) times a week. Patient  not taking: Reported on 07/20/2021 10/23/20   Carollee Herter, Kendrick Fries R, DO  famotidine (PEPCID) 20 MG tablet TAKE 1 TABLET BY MOUTH TWICE DAILY Patient not taking: Reported on 07/20/2021 03/03/20   Dara Hoyer, FNP  flunisolide (NASALIDE) 25 MCG/ACT (0.025%) SOLN Place 2 sprays into the nose 2 (two) times daily. Patient not taking: Reported on 07/20/2021 05/13/16   Charlies Silvers, MD  fluticasone (FLOVENT HFA) 110 MCG/ACT inhaler Inhale 2 puffs into the lungs 2 (two) times daily. Patient not taking: Reported on 07/20/2021 10/12/19   Dara Hoyer, FNP  montelukast (SINGULAIR) 10 MG tablet Take 1 tablet (10 mg total) by mouth at bedtime. Patient not taking: Reported on 07/20/2021 10/12/19   Dara Hoyer, FNP  risedronate (ACTONEL) 35 MG tablet TAKE 1 TAB BY MOUTH EVERY 7 DAYS WITH WATER ON EMPTY STOMACH, NOTHING BY MOUTH OR LIE DOWN FOR NEXT 30 MINUTES. Patient not taking: Reported on 03/06/2021 11/23/19   Carollee Herter, Alferd Apa, DO  VENTOLIN HFA 108 [phone] Base) MCG/ACT inhaler Inhale 2 puffs into the lungs every 4 (four) hours as needed for wheezing or shortness of breath. Patient not taking: Reported on 07/20/2021 10/12/19   Dara Hoyer, FNP  Vitamin D, Ergocalciferol, (DRISDOL) 1.25 MG (50000 UT) CAPS capsule TAKE 1 CAPSULE (50,000 UNITS TOTAL) BY MOUTH EVERY 7 (SEVEN) DAYS. Patient not taking: Reported on 03/06/2021 09/10/18   Ann Held, DO   DG Knee 1-2 Views Left  Result Date: 07/23/2021 CLINICAL DATA:  ORIF for ankle fracture. EXAM: LEFT KNEE - 1-2 VIEW COMPARISON:  None. FINDINGS: 2 intraoperative spot fluoro films obtained at rest and during stress to evaluate the left knee. No gross bony abnormality. IMPRESSION: Intraoperative assessment. Electronically Signed   By: Misty Stanley M.D.   On: 07/23/2021 12:03   DG Ankle Complete Left  Result Date: 07/23/2021 CLINICAL DATA:  Left ankle fracture or dislocation. EXAM: LEFT ANKLE COMPLETE - 3+ VIEW COMPARISON:  Fluoroscopic images of same  day. FINDINGS: The left ankle has been casted and immobilized. Patient is status post surgical internal fixation of distal left tibial and fibular fractures. IMPRESSION: Status post surgical internal fixation of distal left fibular and tibial fractures. Electronically Signed   By: Marijo Conception M.D.   On: 07/23/2021 14:41   DG Ankle Complete Left  Result Date: 07/23/2021 CLINICAL DATA:  Open reduction internal fixation. EXAM: OPERATIVE left ankle 4 VIEWS TECHNIQUE: Fluoroscopic spot image(s) were submitted for interpretation post-operatively. If the device does not provide the exposure index: Fluoroscopy Time (in minutes and seconds):  59.8 seconds Number of Acquired Images:  4 COMPARISON:  Left ankle in CT and radiographs 07/20/2021. FINDINGS: Lateral plate and screw fixation of the distal fibula noted. Posterior tibial plate  screw fixation is present. Two lag screws are evident at the medial malleolus. The ankle is located. No new fractures are present. IMPRESSION: ORIF trimalleolar fracture. No radiographic evidence for complication. Electronically Signed   By: San Morelle M.D.   On: 07/23/2021 11:33   DG C-Arm 1-60 Min-No Report  Result Date: 07/23/2021 Fluoroscopy was utilized by the requesting physician.  No radiographic interpretation.   DG C-Arm 1-60 Min-No Report  Result Date: 07/23/2021 Fluoroscopy was utilized by the requesting physician.  No radiographic interpretation.   Family History Reviewed and non-contributory, no pertinent history of problems with bleeding or anesthesia      Review of Systems 14 system ROS conducted and negative except for that noted in HPI   OBJECTIVE  Vitals:Patient Vitals for the past 8 hrs:  BP Temp Pulse SpO2  07/24/21 0904 103/60 98.2 F (36.8 C) 65 97 %   General: Alert, no acute distress Cardiovascular: Warm extremities noted Respiratory: No cyanosis, no use of accessory musculature GI: No organomegaly, abdomen is soft and  non-tender Skin: No lesions in the area of chief complaint other than those listed below in MSK exam.  Neurologic: Sensation intact distally save for the below mentioned MSK exam Psychiatric: Patient is competent for consent with normal mood and affect Lymphatic: No swelling obvious and reported other than the area involved in the exam below Extremities  Left upper extremity: Range of motion deferred in the setting of known fracture.  Warm well-perfused extremity.  Axillary nerve sensation is intact.  Unable to check motor function secondary to fracture.     Test Results Imaging CT and x-ray reviewed demonstrate a head split complex proximal humerus fracture, 4 part.  Labs cbc Recent Labs    07/23/21 1442 07/24/21 0125  WBC 7.2 9.7  HGB 10.2* 10.4*  HCT 31.2* 31.0*  PLT 179 211    Labs inflam No results for input(s): CRP in the last 72 hours.  Invalid input(s): ESR  Labs coag No results for input(s): INR, PTT in the last 72 hours.  Invalid input(s): PT  Recent Labs    07/23/21 0338 07/23/21 1442 07/24/21 0125  NA 134*  --  139  K 4.2  --  4.1  CL 102  --  106  CO2 24  --  26  GLUCOSE 106*  --  122*  BUN 8  --  10  CREATININE 0.81 0.92 0.77  CALCIUM 8.4*  --  9.0     ASSESSMENT AND PLAN: 65 y.o. female with the following: Four-part proximal humerus fracture with head split.    Patient the patient's injury we would recommend surgery.  Due to her articular involvement as well as her age and presumptive bone stock based on her CT we feel that open reduction internal fixation is likely to progress poorly.  We would rather recommend a reverse total shoulder arthroplasty.  We discussed the specific limitations of this including range of motion, pain, overall function, infection and instability amongst others.  She understands axillary nerve sensation does not necessarily mean that motor function is preserved but is a reasonable sign.  We will plan for urgent surgery  tomorrow.  N.p.o. at midnight.

## 2021-07-24 NOTE — Progress Notes (Signed)
Orthopaedic Trauma Service Progress Note  Patient ID: Kathryn Boyer MRN: [id number] DOB/AGE: 1956-06-25 65 y.o.  Subjective:  Doing ok this am  Transferred to chair with therapy  L shoulder pain is primary concern currently   Discussed pure wick usage   Ok to use until her L shoulder is repaired then it will be discontinued   + history of osteoporosis  Was on actonel for a short period of time but kept for getting to take the medication with its weekly dosing schedule   States she took prolia x 1 and had a terrible reaction where she had difficulty walking for several weeks    Pt is at a high risk for secondary fractures  Her currently constellation of fractures are consistent with fragility fractures    Will need updated DEXA, labs including CTX/NTX, P1NP to help determine most appropriate pharmacologic agent to help manage her osteoporosis. Consider Reclast or Evenity ( if DEXA unchanged or minimal change would favor reclast, if significant changes would favor Evenity)  Pt lives with her husband, who works Live in a house on about 3 acres Has 2 large dogs ([place] shepherd and a Environmental manager)   ROS As above  Objective:   VITALS:   Vitals:   07/23/21 1251 07/23/21 1306 07/23/21 1352 07/24/21 0904  BP: 124/71 126/76 126/76 103/60  Pulse: 96 93 98 65  Resp: 19 19 16    Temp:  (!) 97.4 F (36.3 C) 98.3 F (36.8 C) 98.2 F (36.8 C)  TempSrc:      SpO2: 93% 94% 94% 97%  Weight:      Height:        Estimated body mass index is 27.88 kg/m as calculated from the following:   Height as of this encounter: 5\' 7"  (1.702 m).   Weight as of this encounter: 80.7 kg.   Intake/Output      11/28 0701 11/29 0700 11/29 0701 11/30 0700   P.O. 120    I.V. (mL/kg) 2000 (24.8)    Other 200    IV Piggyback 250    Total Intake(mL/kg) 2570 (31.8)    Urine (mL/kg/hr) 3000 (1.5) 500 (1.7)   Stool      Blood 75    Total Output 3075 500   Net -505 -500          LABS  Results for orders placed or performed during the hospital encounter of 07/20/21 (from the past 24 hour(s))  CBC     Status: Abnormal   Collection Time: 07/23/21  2:42 PM  Result Value Ref Range   WBC 7.2 4.0 - 10.5 K/uL   RBC 3.36 (L) 3.87 - 5.11 MIL/uL   Hemoglobin 10.2 (L) 12.0 - 15.0 g/dL   HCT 07/22/21 (L) 07/25/21 - 24.2 %   MCV 92.9 80.0 - 100.0 fL   MCH 30.4 26.0 - 34.0 pg   MCHC 32.7 30.0 - 36.0 g/dL   RDW 35.3 61.4 - 43.1 %   Platelets 179 150 - 400 K/uL   nRBC 0.0 0.0 - 0.2 %  Creatinine, serum     Status: None   Collection Time: 07/23/21  2:42 PM  Result Value Ref Range   Creatinine, Ser 0.92 0.44 - 1.00 mg/dL   GFR, Estimated 08.6 07/25/21 mL/min  TSH  Status: None   Collection Time: 07/24/21  1:25 AM  Result Value Ref Range   TSH 0.400 0.350 - 4.500 uIU/mL  CBC     Status: Abnormal   Collection Time: 07/24/21  1:25 AM  Result Value Ref Range   WBC 9.7 4.0 - 10.5 K/uL   RBC 3.42 (L) 3.87 - 5.11 MIL/uL   Hemoglobin 10.4 (L) 12.0 - 15.0 g/dL   HCT 56.4 (L) 33.2 - 95.1 %   MCV 90.6 80.0 - 100.0 fL   MCH 30.4 26.0 - 34.0 pg   MCHC 33.5 30.0 - 36.0 g/dL   RDW 88.4 16.6 - 06.3 %   Platelets 211 150 - 400 K/uL   nRBC 0.0 0.0 - 0.2 %  Comprehensive metabolic panel     Status: Abnormal   Collection Time: 07/24/21  1:25 AM  Result Value Ref Range   Sodium 139 135 - 145 mmol/L   Potassium 4.1 3.5 - 5.1 mmol/L   Chloride 106 98 - 111 mmol/L   CO2 26 22 - 32 mmol/L   Glucose, Bld 122 (H) 70 - 99 mg/dL   BUN 10 8 - 23 mg/dL   Creatinine, Ser 0.16 0.44 - 1.00 mg/dL   Calcium 9.0 8.9 - 01.0 mg/dL   Total Protein 5.9 (L) 6.5 - 8.1 g/dL   Albumin 3.0 (L) 3.5 - 5.0 g/dL   AST 26 15 - 41 U/L   ALT 19 0 - 44 U/L   Alkaline Phosphatase 63 38 - 126 U/L   Total Bilirubin 0.8 0.3 - 1.2 mg/dL   GFR, Estimated >93 >23 mL/min   Anion gap 7 5 - 15     PHYSICAL EXAM:    Gen: sitting up in chair, NAD, pleasant   Lungs: unlabored  Cardiac: reg  Abd: soft, NT Ext:   Left Upper Extremity    Sling in place  Right Lower Extremity    Post op shoe    Mild swelling and ecchymosis to R foot and ankle    Ext warm    Motor and sensory functions intact   + DP pulse    Left Lower Extremity    SLS clean, dry and intact   Block nearly worn off    DPN, SPN, TN sensation grossly intact but still diminished   EHL, FHL, Lesser toe motor intact   No pain out of proportion with passive stretching    Swelling controlled     Assessment/Plan: 1 Day Post-Op   Principal Problem:   Left trimalleolar fracture, closed, initial encounter Active Problems:   Adjustment disorder with anxiety   Generalized anxiety disorder   Hyperlipidemia LDL goal <100   Fall   Humerus fracture   Tibial plateau fracture, left   Nondisplaced fracture of fifth metatarsal bone, right foot, initial encounter for closed fracture   Nondisplaced fracture of lateral malleolus of right fibula, initial encounter for closed fracture   Anti-infectives (From admission, onward)    Start     Dose/Rate Route Frequency Ordered Stop   07/23/21 1445  ceFAZolin (ANCEF) IVPB 1 g/50 mL premix        1 g 100 mL/hr over 30 Minutes Intravenous Every 6 hours 07/23/21 1359 07/24/21 0459   07/23/21 0812  ceFAZolin (ANCEF) 2-4 GM/100ML-% IVPB       Note to Pharmacy: Tawanna Sat   : cabinet override      07/23/21 0812 07/23/21 2029     .  POD/HD#: 1  65 y/o  female s/p ground level fall with multiple fractures   -ground level fall   - Left ankle fracture dislocation s/p ORIF   NWB L leg x 8 weeks  Splint x 2 weeks then convert to CAM   Ice and elevate  PT/OT  Ok to move toes and knee    - R distal fibula fracture and proximal 5th metatarsal fracture   Non-op  WBAT for transfers   Post op shoe, will try to get a post op shoe that has better traction   Ice and elevate  Ace or compressive sock for swelling   - comminuted L proximal  humerus fracture   OR tomorrow with Dr. Everardo Pacific for reverse TSA   Weight bearing restrictions TBD but NWB for Now   - Pain management:  Multimodal  - ABL anemia/Hemodynamics  Stable  Cbc in am   - Medical issues   Per primary   - DVT/PE prophylaxis:  Lovenox    Hold 11/30 dose  - ID:   [facility] abx  - Metabolic Bone Disease:  Osteoporosis   Had lengthy discussion with pt    Primary goal now is to prevent secondary fractures   Her fractures by definition are fragility fractures   She meets criteria for pharmacologic treatment of her osteoporosis    Multi-disciplinary approach including diet/nutrition/vitamin supplements, resistance training/balance training, pharmacologics    Due for updated DEXA   Will need additional labs in the outpt setting to help make an informed decision on which pharmacologic agents wound be best at this time point   - Activity:  As above  - FEN/GI prophylaxis/Foley/Lines:  Reg diet for now   NPO after MN   - Impediments to fracture healing:  Osteoporosis   Thyroid disease  - Dispo:  OR tomorrow with Dr. Everardo Pacific   Pt wants to dc home from hospital     Mearl Latin, PA-C [phone] (C) 07/24/2021, 10:40 AM  [facility] [address] [place] [address] [phone] [phone] (F)    After 5pm and on the weekends please log on to Amion, go to orthopaedics and the look under the [facility] Call for the provider(s) on call. You can also call our office at [phone] and then follow the prompts to be connected to the call team.   Patient ID: Kathryn Boyer, female   DOB: August 02, 1956, 65 y.o.   MRN: [id number]

## 2021-07-24 NOTE — Progress Notes (Signed)
Inpatient Rehab Admissions Coordinator:  ? ?Per therapy recommendations,  patient was screened for CIR candidacy by Rudy Luhmann, MS, CCC-SLP. At this time, Pt. Appears to be a a potential candidate for CIR. I will place   order for rehab consult per protocol for full assessment. Please contact me any with questions. ? ?Naseem Adler, MS, CCC-SLP ?Rehab Admissions Coordinator  ?[phone] (celll) ?[phone] (office) ? ?

## 2021-07-24 NOTE — TOC CAGE-AID Note (Signed)
Transition of Care [facility]) - CAGE-AID Screening   Patient Details  Name: Kathryn Boyer MRN: [id number] Date of Birth: 1955/12/07  Transition of Care [facility]) CM/SW Contact:    Lossie Faes Tarpley-Carter, LCSWA Phone Number: 07/24/2021, 2:46 PM   Clinical Narrative: Pt participated in Cage-Aid.  Pt stated she does not use substance or ETOH.  Pt was not offered resources, due to no usage of substance or ETOH.     Zylan Almquist Tarpley-Carter, MSW, LCSW-A Pronouns:  She/Her/Hers [facility] Health[facility]itions of Care Clinical Social Worker Direct Number:  [phone] [email or web address]   CAGE-AID Screening:    Have You Ever Felt You Ought to Cut Down on Your Drinking or Drug Use?: No Have People Annoyed You By [employer] Your Drinking Or Drug Use?: No Have You Felt Bad Or Guilty About Your Drinking Or Drug Use?: No Have You Ever Had a Drink or Used Drugs First Thing In The Morning to Steady Your Nerves or to Get Rid of a Hangover?: No CAGE-AID Score: 0  Substance Abuse Education Offered: No

## 2021-07-24 NOTE — Progress Notes (Signed)
PROGRESS NOTE  Kathryn Boyer XLK:[id number] DOB: [birth year]   PCP: Donato Schultz, DO  Patient is from: Home.  Lives with family.  Independently ambulates at baseline.  DOA: 07/20/2021 LOS: 4  Chief complaints:  Chief Complaint  Patient presents with   Fall   Ankle Pain   Shoulder Injury     Brief Narrative / Interim history: 65 year old F with PMH of HTN, HLD, hypothyroidism and GERD, who presented to [facility] ED on 11/25 via EMS with complaint of left ankle and shoulder pain following fall.  Patient reports she was walking out of her house and tripped over some carpet cleaning hoses and fell onto her left side.  Denies any loss of conscious or head injury.  Patient was unable to mobilize following the fall and EMS was activated.    In the ED, multiple imaging studies notable for left humerus fracture, left tibial plateau fracture, right foot fifth metatarsal and lateral malleolus fracture, left foot trimalleolar ankle fracture, and left scaphoid deformity.  Orthopedics and hospitalist service consulted.   Patient underwent ORIF on 11/28 by Dr. Carola Frost for left ankle fracture.  Plan for left shoulder surgery on 11/30.  Subjective: Seen and examined earlier this morning.  No major events overnight of this morning.  She had significant pain in left leg that she describes as "charlie horse" that has improved after IV Dilaudid.  Now pain is fairly controlled.  Denies chest pain, dyspnea, GI or UTI symptoms.  Objective: Vitals:   07/23/21 1251 07/23/21 1306 07/23/21 1352 07/24/21 0904  BP: 124/71 126/76 126/76 103/60  Pulse: 96 93 98 65  Resp: 19 19 16    Temp:  (!) 97.4 F (36.3 C) 98.3 F (36.8 C) 98.2 F (36.8 C)  TempSrc:      SpO2: 93% 94% 94% 97%  Weight:      Height:        Intake/Output Summary (Last 24 hours) at 07/24/2021 1457 Last data filed at 07/24/2021 1021 Gross per 24 hour  Intake --  Output 2200 ml  Net -2200 ml   Filed Weights   07/20/21 1034  Weight:  80.7 kg    Examination:  GENERAL: No apparent distress.  Nontoxic. HEENT: MMM.  Vision and hearing grossly intact.  NECK: Supple.  No apparent JVD.  RESP: 97% on RA.  No IWOB.  Fair aeration bilaterally. CVS:  RRR. Heart sounds normal.  ABD/GI/GU: BS+. Abd soft, NTND.  MSK/EXT: LUE in sling.  Bulky dressing over LLE.  Right foot in postop shoe. SKIN: no apparent skin lesion or wound NEURO: Awake and alert. Oriented appropriately.  No apparent focal neuro deficit. PSYCH: Calm. Normal affect.   Procedures:  11/28-ORIF of left ankle  Microbiology summarized: COVID-19 and influenza PCR nonreactive. MRSA PCR screen negative.  Assessment & Plan: Accidental fall with subsequent polytrauma Left ankle trimalleolar fracture, closed, initial encounter Left Humerus four-part fracture Left medial tibial plateau fracture  Nondisplaced fracture of fifth metatarsal bone, right foot, initial encounter for closed fracture Nondisplaced fracture of lateral malleolus of right fibula, initial encounter for closed fracture History of osteoporosis-no longer takes Actonel.  Followed outpatient -Vitamin D level 39. -S/p ORIF for left ankle on 11/28 -LLE NWB for 8 weeks.  Graduated weightbearing after that -Left shoulder, NWB with sling.  Plan for surgery on 11/30. -RLE weightbearing with Cam boot -AROM of left knee  -Pain control and VTE prophylaxis per surgery. -Bowel regimen  Essential hypertension: On olmesartan 40 mg daily at home. -  Continue holding antihypertensive meds  HLD: Pravastatin 20 mg p.o. daily   Hypothyroidism: TSH within normal. -Levothyroxine 100 mcg p.o. daily.    Generalized anxiety disorder -Valium 5 mg p.o. q6h PRN  Body mass index is 27.88 kg/m.         DVT prophylaxis:  enoxaparin (LOVENOX) injection 40 mg Start: 07/24/21 0800 SCDs Start: 07/23/21 1400  Code Status: Full code Family Communication: Updated patient's husband at bedside on 11/28. Level of  care: Med-Surg Status is: Inpatient  Remains inpatient appropriate because: Surgical intervention for polytrauma/fracture       Consultants:  Orthopedic surgery   Sch Meds:  Scheduled Meds:  acetaminophen  1,000 mg Oral Q8H   vitamin C  500 mg Oral Daily   calcium citrate  200 mg of elemental calcium Oral BID   cholecalciferol  2,000 Units Oral BID   docusate sodium  100 mg Oral BID   enoxaparin (LOVENOX) injection  40 mg Subcutaneous Q24H   levothyroxine  100 mcg Oral Q0600   methocarbamol  750 mg Oral TID   pravastatin  20 mg Oral Daily   Continuous Infusions:  [START ON 07/25/2021]  ceFAZolin (ANCEF) IV     PRN Meds:.acetaminophen, diazepam, HYDROmorphone (DILAUDID) injection, magnesium hydroxide, metoCLOPramide **OR** metoCLOPramide (REGLAN) injection, metoprolol tartrate, naLOXone (NARCAN)  injection, ondansetron **OR** ondansetron (ZOFRAN) IV, oxyCODONE, polyethylene glycol, senna-docusate, sodium phosphate, sorbitol  Antimicrobials: Anti-infectives (From admission, onward)    Start     Dose/Rate Route Frequency Ordered Stop   07/25/21 0830  ceFAZolin (ANCEF) IVPB 2g/100 mL premix        2 g 200 mL/hr over 30 Minutes Intravenous  Once 07/24/21 1213     07/23/21 1445  ceFAZolin (ANCEF) IVPB 1 g/50 mL premix        1 g 100 mL/hr over 30 Minutes Intravenous Every 6 hours 07/23/21 1359 07/24/21 0459   07/23/21 0812  ceFAZolin (ANCEF) 2-4 GM/100ML-% IVPB       Note to Pharmacy: Tawanna Sat   : cabinet override      07/23/21 0812 07/23/21 2029        I have personally reviewed the following labs and images: CBC: Recent Labs  Lab 07/20/21 1131 07/21/21 0323 07/23/21 0338 07/23/21 1442 07/24/21 0125  WBC 14.0* 11.0* 7.6 7.2 9.7  NEUTROABS 11.3*  --   --   --   --   HGB 14.7 13.2 11.2* 10.2* 10.4*  HCT 43.4 40.2 33.2* 31.2* 31.0*  MCV 91.2 93.9 91.0 92.9 90.6  PLT 239 190 165 179 211   BMP &GFR Recent Labs  Lab 07/20/21 1131 07/21/21 0323  07/23/21 0338 07/23/21 1442 07/24/21 0125  NA 136 135 134*  --  139  K 3.9 4.3 4.2  --  4.1  CL 105 104 102  --  106  CO2 22 22 24   --  26  GLUCOSE 145* 125* 106*  --  122*  BUN 12 12 8   --  10  CREATININE 0.86 0.93 0.81 0.92 0.77  CALCIUM 8.8* 8.6* 8.4*  --  9.0   Estimated Creatinine Clearance: 75.6 mL/min (by C-G formula based on SCr of 0.77 mg/dL). Liver & Pancreas: Recent Labs  Lab 07/21/21 0323 07/24/21 0125  AST 27 26  ALT 22 19  ALKPHOS 67 63  BILITOT 0.8 0.8  PROT 6.6 5.9*  ALBUMIN 3.9 3.0*   No results for input(s): LIPASE, AMYLASE in the last 168 hours. No results for input(s): AMMONIA in the last  168 hours. Diabetic: No results for input(s): HGBA1C in the last 72 hours. No results for input(s): GLUCAP in the last 168 hours. Cardiac Enzymes: No results for input(s): CKTOTAL, CKMB, CKMBINDEX, TROPONINI in the last 168 hours. No results for input(s): PROBNP in the last 8760 hours. Coagulation Profile: No results for input(s): INR, PROTIME in the last 168 hours. Thyroid Function Tests: Recent Labs    07/24/21 0125  TSH 0.400   Lipid Profile: No results for input(s): CHOL, HDL, LDLCALC, TRIG, CHOLHDL, LDLDIRECT in the last 72 hours. Anemia Panel: No results for input(s): VITAMINB12, FOLATE, FERRITIN, TIBC, IRON, RETICCTPCT in the last 72 hours. Urine analysis:    Component Value Date/Time   GLUCOSEU Negative 04/29/2019 1036   BILIRUBINUR negative 04/29/2019 1036   PROTEINUR negative 09/22/2017 1106   UROBILINOGEN 0.2 04/29/2019 1036   NITRITE negative 04/29/2019 1036   LEUKOCYTESUR Negative 04/29/2019 1036   Sepsis Labs: Invalid input(s): PROCALCITONIN, LACTICIDVEN  Microbiology: Recent Results (from the past 240 hour(s))  Resp Panel by RT-PCR (Flu A&B, Covid) Nasopharyngeal Swab     Status: None   Collection Time: 07/20/21 11:31 AM   Specimen: Nasopharyngeal Swab; Nasopharyngeal(NP) swabs in vial transport medium  Result Value Ref Range Status    SARS Coronavirus 2 by RT PCR NEGATIVE NEGATIVE Final    Comment: (NOTE) SARS-CoV-2 target nucleic acids are NOT DETECTED.  The SARS-CoV-2 RNA is generally detectable in upper respiratory specimens during the acute phase of infection. The lowest concentration of SARS-CoV-2 viral copies this assay can detect is 138 copies/mL. A negative result does not preclude SARS-Cov-2 infection and should not be used as the sole basis for treatment or other patient management decisions. A negative result may occur with  improper specimen collection/handling, submission of specimen other than nasopharyngeal swab, presence of viral mutation(s) within the areas targeted by this assay, and inadequate number of viral copies(<138 copies/mL). A negative result must be combined with clinical observations, patient history, and epidemiological information. The expected result is Negative.  Fact Sheet for Patients:  BloggerCourse.com  Fact Sheet for Healthcare Providers:  SeriousBroker.it  This test is no t yet approved or cleared by the [place] FDA and  has been authorized for detection and/or diagnosis of SARS-CoV-2 by FDA under an Emergency Use Authorization (EUA). This EUA will remain  in effect (meaning this test can be used) for the duration of the COVID-19 declaration under Section 564(b)(1) of the Act, 21 U.S.C.section 360bbb-3(b)(1), unless the authorization is terminated  or revoked sooner.       Influenza A by PCR NEGATIVE NEGATIVE Final   Influenza B by PCR NEGATIVE NEGATIVE Final    Comment: (NOTE) The Xpert Xpress SARS-CoV-2/FLU/RSV plus assay is intended as an aid in the diagnosis of influenza from Nasopharyngeal swab specimens and should not be used as a sole basis for treatment. Nasal washings and aspirates are unacceptable for Xpert Xpress SARS-CoV-2/FLU/RSV testing.  Fact Sheet for  Patients: BloggerCourse.com  Fact Sheet for Healthcare Providers: SeriousBroker.it  This test is not yet approved or cleared by the [place] FDA and has been authorized for detection and/or diagnosis of SARS-CoV-2 by FDA under an Emergency Use Authorization (EUA). This EUA will remain in effect (meaning this test can be used) for the duration of the COVID-19 declaration under Section 564(b)(1) of the Act, 21 U.S.C. section 360bbb-3(b)(1), unless the authorization is terminated or revoked.  Performed at [facility], [address] [place], [place] [address]   Surgical pcr  screen     Status: None   Collection Time: 07/23/21  7:45 AM   Specimen: Nasal Mucosa; Nasal Swab  Result Value Ref Range Status   MRSA, PCR NEGATIVE NEGATIVE Final   Staphylococcus aureus NEGATIVE NEGATIVE Final    Comment: (NOTE) The Xpert SA Assay (FDA approved for NASAL specimens in patients 47 years of age and older), is one component of a comprehensive surveillance program. It is not intended to diagnose infection nor to guide or monitor treatment. Performed at [facility], [address], [place], [place] [address]     Radiology Studies: No results found.     Zephaniah Lubrano T. Ardath Lepak [facility]  If 7PM-7AM, please contact night-coverage [email or web address] 07/24/2021, 2:57 PM

## 2021-07-24 NOTE — H&P (View-Only) (Signed)
   ORTHOPAEDIC CONSULTATION  REQUESTING PHYSICIAN: Mike Handy  Chief Complaint: Multitrauma with left proximal humerus fracture  HPI: Kathryn Boyer is a 65 y.o. female with multiple trauma injury with a left head split proximal humerus fracture.  I was consulted for assistance with this complex fracture as it was deemed unreconstructable.  Patient is resting comfortably in her chair today.  She reports that her shoulder does cause her pain.  Past Medical History:  Diagnosis Date   Anxiety    Asthma    Hyperlipidemia    Hypertension    Plantar fasciitis    Thyroid disease    Past Surgical History:  Procedure Laterality Date   ABDOMINAL HYSTERECTOMY     BREAST SURGERY     NOSE SURGERY     ORIF ANKLE FRACTURE Left 07/23/2021   Procedure: OPEN REDUCTION INTERNAL FIXATION (ORIF) ANKLE FRACTURE;  Surgeon: Handy, Michael, MD;  Location: [facility] OR;  Service: Orthopedics;  Laterality: Left;   Social History   Socioeconomic History   Marital status: Married    Spouse name: Not on file   Number of children: Not on file   Years of education: Not on file   Highest education level: Not on file  Occupational History   Not on file  Tobacco Use   Smoking status: Never   Smokeless tobacco: Never  Vaping Use   Vaping Use: Never used  Substance and Sexual Activity   Alcohol use: No   Drug use: No   Sexual activity: Not on file  Other Topics Concern   Not on file  Social History Narrative   Exercise-sometimes   Social Determinants of Health   Financial Resource Strain: Not on file  Food Insecurity: Not on file  Transportation Needs: Not on file  Physical Activity: Not on file  Stress: Not on file  Social Connections: Not on file   Family History  Problem Relation Age of Onset   Heart disease Mother    Stroke Mother    Hypertension Mother    Cancer Mother        ovarian   Aneurysm Father    Hypertension Father    Heart disease Father    Kidney disease Father     Heart disease Sister    Allergies  Allergen Reactions   Contrast Media [Iodinated Diagnostic Agents] Hives   Isovue [Iopamidol] Hives    After returning home pt called to inform technologist of rash to face and neck, denies difficulty breathing or swallowing, pt took 2 benedryl Will need premeds in future   Amoxicillin Other (See Comments)    Headache  Cephalosporin tolerant 07/23/2021   Belviq Xr [Lorcaserin Hcl Er] Itching    Caused her face to turn red.   Saxenda [Liraglutide -Weight Management] Other (See Comments)    Caused indigestion.   Penicillins Rash    Cephalosporin Tolerant 07/23/2021    Prior to Admission medications   Medication Sig Start Date End Date Taking? Authorizing Provider  ALPRAZolam (XANAX) 0.25 MG tablet Take 1 tablet (0.25 mg total) by mouth daily as needed. Patient taking differently: Take 0.25 mg by mouth daily as needed for anxiety. 04/19/21  Yes Lowne Chase, Yvonne R, DO  levothyroxine (SYNTHROID) 100 MCG tablet TAKE 1 TABLET BY MOUTH EVERY DAY Patient taking differently: Take 100 mcg by mouth daily before breakfast. 04/23/21  Yes Lowne Chase, Yvonne R, DO  olmesartan (BENICAR) 40 MG tablet TAKE 1 TABLET(40 MG) BY MOUTH DAILY Patient taking differently: Take 40   mg by mouth daily. 05/10/21  Yes Roma Schanz R, DO  pravastatin (PRAVACHOL) 20 MG tablet TAKE 1 TABLET(20 MG) BY MOUTH DAILY Patient taking differently: Take 20 mg by mouth daily. 05/08/21  Yes Ann Held, DO  cyclobenzaprine (FLEXERIL) 5 MG tablet Take 1 tablet (5 mg total) by mouth 3 (three) times daily as needed for muscle spasms. Patient not taking: Reported on 07/20/2021 01/25/20   Debbrah Alar, NP  estradiol (ESTRACE) 0.1 MG/GM vaginal cream Place 1 Applicatorful vaginally 3 (three) times a week. Patient not taking: Reported on 07/20/2021 12/26/17   Carollee Herter, Alferd Apa, DO  estradiol (VIVELLE-DOT) 0.025 MG/24HR Place 1 patch onto the skin 2 (two) times a week. Patient  not taking: Reported on 07/20/2021 10/23/20   Carollee Herter, Kendrick Fries R, DO  famotidine (PEPCID) 20 MG tablet TAKE 1 TABLET BY MOUTH TWICE DAILY Patient not taking: Reported on 07/20/2021 03/03/20   Dara Hoyer, FNP  flunisolide (NASALIDE) 25 MCG/ACT (0.025%) SOLN Place 2 sprays into the nose 2 (two) times daily. Patient not taking: Reported on 07/20/2021 05/13/16   Charlies Silvers, MD  fluticasone (FLOVENT HFA) 110 MCG/ACT inhaler Inhale 2 puffs into the lungs 2 (two) times daily. Patient not taking: Reported on 07/20/2021 10/12/19   Dara Hoyer, FNP  montelukast (SINGULAIR) 10 MG tablet Take 1 tablet (10 mg total) by mouth at bedtime. Patient not taking: Reported on 07/20/2021 10/12/19   Dara Hoyer, FNP  risedronate (ACTONEL) 35 MG tablet TAKE 1 TAB BY MOUTH EVERY 7 DAYS WITH WATER ON EMPTY STOMACH, NOTHING BY MOUTH OR LIE DOWN FOR NEXT 30 MINUTES. Patient not taking: Reported on 03/06/2021 11/23/19   Carollee Herter, Alferd Apa, DO  VENTOLIN HFA 108 [phone] Base) MCG/ACT inhaler Inhale 2 puffs into the lungs every 4 (four) hours as needed for wheezing or shortness of breath. Patient not taking: Reported on 07/20/2021 10/12/19   Dara Hoyer, FNP  Vitamin D, Ergocalciferol, (DRISDOL) 1.25 MG (50000 UT) CAPS capsule TAKE 1 CAPSULE (50,000 UNITS TOTAL) BY MOUTH EVERY 7 (SEVEN) DAYS. Patient not taking: Reported on 03/06/2021 09/10/18   Ann Held, DO   DG Knee 1-2 Views Left  Result Date: 07/23/2021 CLINICAL DATA:  ORIF for ankle fracture. EXAM: LEFT KNEE - 1-2 VIEW COMPARISON:  None. FINDINGS: 2 intraoperative spot fluoro films obtained at rest and during stress to evaluate the left knee. No gross bony abnormality. IMPRESSION: Intraoperative assessment. Electronically Signed   By: Misty Stanley M.D.   On: 07/23/2021 12:03   DG Ankle Complete Left  Result Date: 07/23/2021 CLINICAL DATA:  Left ankle fracture or dislocation. EXAM: LEFT ANKLE COMPLETE - 3+ VIEW COMPARISON:  Fluoroscopic images of same  day. FINDINGS: The left ankle has been casted and immobilized. Patient is status post surgical internal fixation of distal left tibial and fibular fractures. IMPRESSION: Status post surgical internal fixation of distal left fibular and tibial fractures. Electronically Signed   By: Marijo Conception M.D.   On: 07/23/2021 14:41   DG Ankle Complete Left  Result Date: 07/23/2021 CLINICAL DATA:  Open reduction internal fixation. EXAM: OPERATIVE left ankle 4 VIEWS TECHNIQUE: Fluoroscopic spot image(s) were submitted for interpretation post-operatively. If the device does not provide the exposure index: Fluoroscopy Time (in minutes and seconds):  59.8 seconds Number of Acquired Images:  4 COMPARISON:  Left ankle in CT and radiographs 07/20/2021. FINDINGS: Lateral plate and screw fixation of the distal fibula noted. Posterior tibial plate  screw fixation is present. Two lag screws are evident at the medial malleolus. The ankle is located. No new fractures are present. IMPRESSION: ORIF trimalleolar fracture. No radiographic evidence for complication. Electronically Signed   By: Christopher  Mattern M.D.   On: 07/23/2021 11:33   DG C-Arm 1-60 Min-No Report  Result Date: 07/23/2021 Fluoroscopy was utilized by the requesting physician.  No radiographic interpretation.   DG C-Arm 1-60 Min-No Report  Result Date: 07/23/2021 Fluoroscopy was utilized by the requesting physician.  No radiographic interpretation.   Family History Reviewed and non-contributory, no pertinent history of problems with bleeding or anesthesia      Review of Systems 14 system ROS conducted and negative except for that noted in HPI   OBJECTIVE  Vitals:Patient Vitals for the past 8 hrs:  BP Temp Pulse SpO2  07/24/21 0904 103/60 98.2 F (36.8 C) 65 97 %   General: Alert, no acute distress Cardiovascular: Warm extremities noted Respiratory: No cyanosis, no use of accessory musculature GI: No organomegaly, abdomen is soft and  non-tender Skin: No lesions in the area of chief complaint other than those listed below in MSK exam.  Neurologic: Sensation intact distally save for the below mentioned MSK exam Psychiatric: Patient is competent for consent with normal mood and affect Lymphatic: No swelling obvious and reported other than the area involved in the exam below Extremities  Left upper extremity: Range of motion deferred in the setting of known fracture.  Warm well-perfused extremity.  Axillary nerve sensation is intact.  Unable to check motor function secondary to fracture.     Test Results Imaging CT and x-ray reviewed demonstrate a head split complex proximal humerus fracture, 4 part.  Labs cbc Recent Labs    07/23/21 1442 07/24/21 0125  WBC 7.2 9.7  HGB 10.2* 10.4*  HCT 31.2* 31.0*  PLT 179 211    Labs inflam No results for input(s): CRP in the last 72 hours.  Invalid input(s): ESR  Labs coag No results for input(s): INR, PTT in the last 72 hours.  Invalid input(s): PT  Recent Labs    07/23/21 0338 07/23/21 1442 07/24/21 0125  NA 134*  --  139  K 4.2  --  4.1  CL 102  --  106  CO2 24  --  26  GLUCOSE 106*  --  122*  BUN 8  --  10  CREATININE 0.81 0.92 0.77  CALCIUM 8.4*  --  9.0     ASSESSMENT AND PLAN: 65 y.o. female with the following: Four-part proximal humerus fracture with head split.    Patient the patient's injury we would recommend surgery.  Due to her articular involvement as well as her age and presumptive bone stock based on her CT we feel that open reduction internal fixation is likely to progress poorly.  We would rather recommend a reverse total shoulder arthroplasty.  We discussed the specific limitations of this including range of motion, pain, overall function, infection and instability amongst others.  She understands axillary nerve sensation does not necessarily mean that motor function is preserved but is a reasonable sign.  We will plan for urgent surgery  tomorrow.  N.p.o. at midnight. 

## 2021-07-24 NOTE — Progress Notes (Addendum)
Occupational Therapy Evaluation  Pt presents with pain and decreased balance, mobility, strength, and activity tolerance. Pt currently requiring Mod -Total A with ADLs and Mod A for functional transfers. Pt independent at baseline, working, driving, and caring for foster children. Pt lives with husband who also works. Scheduled for L reverse TSA tomorrow 11/30 and awaiting post-op recs for WB and ROM. Pt would likely benefit from CIR to maximize safety/independence with ADLs prior to return home. Will follow acutely.   07/24/21 1400  OT Visit Information  Last OT Received On 07/24/21  Assistance Needed +1  History of Present Illness Patient is a 65 y/o female who presents 11/25 to [facility] with multiple fxs s/p fall - left humerus fx, right foot/lateral malleolus fx and left tibial plateau fx now s/p ORIF LLE 11/28 and plan for surgery on left shoulder 11/30. PMH includes osteoporosis, HTN, anxiety.  Precautions  Precautions Fall  Required Braces or Orthoses Sling;Other Brace  Other Brace post op shoe RLE, WBAT  Restrictions  Weight Bearing Restrictions Yes  LUE Weight Bearing NWB  RLE Weight Bearing WBAT  LLE Weight Bearing NWB  Other Position/Activity Restrictions No restrictions knee ROM on left  Home Living  Family/patient expects to be discharged to: Private residence  Living Arrangements Spouse/significant other  Available Help at Discharge Family  Type of Home House  Home Access Stairs to enter  Entrance Stairs-Number of Steps 3 + 3  Entrance Stairs-Rails None  Home Layout Two level;Able to live on main level with bedroom/bathroom  Bathroom Shower/Tub Tub/shower unit;Walk-in Physiological scientist Handicapped height  Home Equipment Wheelchair - Forensic psychologist (2 wheels);Shower seat  Prior Function  Prior Level of Function  Independent/Modified Independent;Working/employed;Driving  Communication  Communication No difficulties  Pain Assessment  Pain Assessment Faces  Faces  Pain Scale 8  Pain Location left shoulder, right foot, LLE  Pain Descriptors / Indicators Operative site guarding;Sore;Aching;Cramping;Grimacing;Guarding  Pain Intervention(s) Limited activity within patient's tolerance;Monitored during session;Repositioned;Ice applied  Cognition  Arousal/Alertness Awake/alert  Behavior During Therapy WFL for tasks assessed/performed  Overall Cognitive Status Within Functional Limits for tasks assessed  Upper Extremity Assessment  Upper Extremity Assessment LUE deficits/detail  LUE Deficits / Details Did not test. Scheduled for reverse TSA tomorrow 11/30  Lower Extremity Assessment  Lower Extremity Assessment Defer to PT evaluation  Cervical / Trunk Assessment  Cervical / Trunk Assessment Normal  Vision- Assessment  Vision Assessment? No apparent visual deficits  ADL  Overall ADL's  Needs assistance/impaired  Eating/Feeding Set up  Grooming Minimal assistance;Sitting  Upper Body Bathing Moderate assistance;Sitting  Lower Body Bathing Maximal assistance;Sitting/lateral leans  Upper Body Dressing  Moderate assistance;Sitting  Lower Body Dressing Maximal assistance;Sitting/lateral leans  Toilet Transfer Moderate assistance;Squat-pivot;BSC/3in1  Toilet Transfer Details (indicate cue type and reason) Simulated to bed from recliner.  Toileting- Clothing Manipulation and Hygiene Total assistance  Bed Mobility  Overal bed mobility Needs Assistance  Bed Mobility Sit to Supine  Sit to supine Min assist  General bed mobility comments Min A to support L shoulder and manage LEs back into bed  Transfers  Overall transfer level Needs assistance  Equipment used None  Transfers Bed to chair/wheelchair/BSC  Bed to/from chair/wheelchair/BSC transfer type: Squat pivot   Lateral/Scoot Transfers Mod assist  Balance  Overall balance assessment Needs assistance  Sitting-balance support Feet supported;No upper extremity supported  Sitting balance-Leahy Scale Good   OT - End of Session  Equipment Utilized During Treatment Gait belt  Activity Tolerance Patient limited by pain  Patient left in bed;with call bell/phone within reach;with bed alarm set  Nurse Communication Mobility status  OT Assessment  OT Recommendation/Assessment Patient needs continued OT Services  OT Visit Diagnosis Unsteadiness on feet (R26.81);Other abnormalities of gait and mobility (R26.89);Muscle weakness (generalized) (M62.81);Pain  OT Problem List Decreased strength;Decreased range of motion;Decreased activity tolerance;Impaired balance (sitting and/or standing);Decreased knowledge of use of DME or AE;Decreased knowledge of precautions;Pain;Impaired UE functional use  OT Plan  OT Frequency (ACUTE ONLY) Min 3X/week  OT Treatment/Interventions (ACUTE ONLY) Self-care/ADL training;Therapeutic exercise;Neuromuscular education;Energy conservation;DME and/or AE instruction;Therapeutic activities;Patient/family education;Balance training  AM-PAC OT "6 Clicks" Daily Activity Outcome Measure (Version 2)  Help from another person eating meals? 3  Help from another person taking care of personal grooming? 3  Help from another person toileting, which includes using toliet, bedpan, or urinal? 1  Help from another person bathing (including washing, rinsing, drying)? 2  Help from another person to put on and taking off regular upper body clothing? 2  Help from another person to put on and taking off regular lower body clothing? 2  6 Click Score 13  Progressive Mobility  What is the highest level of mobility based on the progressive mobility assessment? Level 2 (Chairfast) - Balance while sitting on edge of bed and cannot stand  Mobility Out of bed for toileting;Out of bed to chair with meals  OT Recommendation  Recommendations for Other Services Rehab consult  Follow Up Recommendations Acute inpatient rehab (3hours/day)  Assistance recommended at discharge Frequent or constant  Supervision/Assistance  Functional Status Assessent Patient has had a recent decline in their functional status and demonstrates the ability to make significant improvements in function in a reasonable and predictable amount of time.  OT Equipment BSC/3in1  Individuals Consulted  Consulted and Agree with Results and Recommendations Patient  Acute Rehab OT Goals  Patient Stated Goal CIR then home  OT Goal Formulation With patient  Time For Goal Achievement 08/07/21  Potential to Achieve Goals Good  OT Time Calculation  OT Start Time (ACUTE ONLY) 1352  OT Stop Time (ACUTE ONLY) 1427  OT Time Calculation (min) 35 min  OT General Charges  $OT Visit 1 Visit  OT Evaluation  $OT Eval Moderate Complexity 1 Mod  OT Treatments     $Therapeutic Activity 8-22 mins  Written Expression  Dominant Hand Right   Abdurrahman Petersheim C, OT/L  Acute Rehab [phone]

## 2021-07-25 ENCOUNTER — Telehealth (HOSPITAL_BASED_OUTPATIENT_CLINIC_OR_DEPARTMENT_OTHER): Payer: Self-pay

## 2021-07-25 ENCOUNTER — Encounter (HOSPITAL_COMMUNITY)
Admission: EM | Disposition: A | Discharge status: Rehabilitation Facility | Payer: Self-pay | Source: Home / Self Care | Attending: Orthopaedic Surgery

## 2021-07-25 ENCOUNTER — Inpatient Hospital Stay (HOSPITAL_COMMUNITY): Payer: BC Managed Care – PPO | Admitting: Certified Registered Nurse Anesthetist

## 2021-07-25 ENCOUNTER — Inpatient Hospital Stay (HOSPITAL_COMMUNITY): Payer: BC Managed Care – PPO

## 2021-07-25 ENCOUNTER — Encounter (HOSPITAL_COMMUNITY): Payer: Self-pay | Admitting: Internal Medicine

## 2021-07-25 DIAGNOSIS — S82892A Other fracture of left lower leg, initial encounter for closed fracture: Secondary | ICD-10-CM

## 2021-07-25 DIAGNOSIS — S82891A Other fracture of right lower leg, initial encounter for closed fracture: Secondary | ICD-10-CM

## 2021-07-25 DIAGNOSIS — F4322 Adjustment disorder with anxiety: Secondary | ICD-10-CM

## 2021-07-25 HISTORY — PX: REVERSE SHOULDER ARTHROPLASTY: SHX5054

## 2021-07-25 LAB — CALCIUM, IONIZED: Calcium, Ionized, Serum: 5.2 mg/dL (ref 4.5–5.6)

## 2021-07-25 LAB — CREATININE, SERUM
Creatinine, Ser: 0.85 mg/dL (ref 0.44–1.00)
GFR, Estimated: >60 mL/min (ref >60)

## 2021-07-25 LAB — CBC
HCT: 34 % — ABNORMAL LOW (ref 36.0–46.0)
Hemoglobin: 11.4 g/dL — ABNORMAL LOW (ref 12.0–15.0)
MCH: 30.9 pg (ref 26.0–34.0)
MCHC: 33.5 g/dL (ref 30.0–36.0)
MCV: 92.1 fL (ref 80.0–100.0)
Platelets: 291 10*3/uL (ref 150–400)
RBC: 3.69 MIL/uL — ABNORMAL LOW (ref 3.87–5.11)
RDW: 12.4 % (ref 11.5–15.5)
WBC: 9.6 10*3/uL (ref 4.0–10.5)
nRBC: 0 % (ref 0.0–0.2)

## 2021-07-25 LAB — PTH, INTACT AND CALCIUM
Calcium, Total (PTH): 8.7 mg/dL (ref 8.7–10.3)
PTH: 20 pg/mL (ref 15–65)

## 2021-07-25 SURGERY — ARTHROPLASTY, SHOULDER, TOTAL, REVERSE
Anesthesia: General | Site: Shoulder | Laterality: Left

## 2021-07-25 MED ORDER — LACTATED RINGERS IV SOLN: INTRAVENOUS | Status: DC

## 2021-07-25 MED ORDER — VANCOMYCIN HCL 1000 MG IV SOLR
INTRAVENOUS | Status: DC | PRN
  Administered 2021-07-25: 1000 mg via TOPICAL

## 2021-07-25 MED ORDER — PROPOFOL 10 MG/ML IV BOLUS
INTRAVENOUS | Status: DC | PRN
  Administered 2021-07-25: 110 mg via INTRAVENOUS

## 2021-07-25 MED ORDER — FENTANYL CITRATE (PF) 100 MCG/2ML IJ SOLN
INTRAMUSCULAR | Status: AC
  Administered 2021-07-25: 50 ug via INTRAVENOUS
  Filled 2021-07-25: qty 2

## 2021-07-25 MED ORDER — MEPERIDINE HCL 25 MG/ML IJ SOLN
6.2500 mg | INTRAMUSCULAR | Status: DC | PRN
Start: 2021-07-25 — End: 2021-07-25

## 2021-07-25 MED ORDER — PHENYLEPHRINE 40 MCG/ML (10ML) SYRINGE FOR IV PUSH (FOR BLOOD PRESSURE SUPPORT)
PREFILLED_SYRINGE | INTRAVENOUS | Status: AC
  Filled 2021-07-25: qty 10

## 2021-07-25 MED ORDER — EPHEDRINE 5 MG/ML INJ
INTRAVENOUS | Status: AC
  Filled 2021-07-25: qty 5

## 2021-07-25 MED ORDER — ENOXAPARIN SODIUM 40 MG/0.4ML IJ SOSY
40.0000 mg | PREFILLED_SYRINGE | INTRAMUSCULAR | Status: DC
  Administered 2021-07-26 – 2021-07-27 (×2): 40 mg via SUBCUTANEOUS
  Filled 2021-07-25 (×2): qty 0.4

## 2021-07-25 MED ORDER — DEXAMETHASONE SODIUM PHOSPHATE 10 MG/ML IJ SOLN
INTRAMUSCULAR | Status: DC | PRN
  Administered 2021-07-25: 8 mg via INTRAVENOUS

## 2021-07-25 MED ORDER — OXYCODONE HCL 5 MG PO TABS
5.0000 mg | ORAL_TABLET | ORAL | Status: DC | PRN
  Administered 2021-07-26: 10 mg via ORAL

## 2021-07-25 MED ORDER — 0.9 % SODIUM CHLORIDE (POUR BTL) OPTIME
TOPICAL | Status: DC | PRN
  Administered 2021-07-25: 1000 mL

## 2021-07-25 MED ORDER — PHENYLEPHRINE HCL-NACL 20-0.9 MG/250ML-% IV SOLN
INTRAVENOUS | Status: DC | PRN
  Administered 2021-07-25: 50 ug/min via INTRAVENOUS

## 2021-07-25 MED ORDER — ACETAMINOPHEN 500 MG PO TABS
1000.0000 mg | ORAL_TABLET | Freq: Three times a day (TID) | ORAL | Status: AC
  Administered 2021-07-25 – 2021-07-26 (×4): 1000 mg via ORAL
  Filled 2021-07-25 (×4): qty 2

## 2021-07-25 MED ORDER — EPHEDRINE SULFATE 50 MG/ML IJ SOLN
INTRAMUSCULAR | Status: DC | PRN
  Administered 2021-07-25: 5 mg via INTRAVENOUS

## 2021-07-25 MED ORDER — MIDAZOLAM HCL 2 MG/2ML IJ SOLN: 2.0000 mg | Freq: Once | INTRAMUSCULAR | Status: AC

## 2021-07-25 MED ORDER — SUGAMMADEX SODIUM 200 MG/2ML IV SOLN
INTRAVENOUS | Status: DC | PRN
  Administered 2021-07-25: 200 mg via INTRAVENOUS

## 2021-07-25 MED ORDER — CHLORHEXIDINE GLUCONATE 0.12 % MT SOLN
OROMUCOSAL | Status: AC
  Administered 2021-07-25: 15 mL via OROMUCOSAL
  Filled 2021-07-25: qty 15

## 2021-07-25 MED ORDER — FENTANYL CITRATE (PF) 250 MCG/5ML IJ SOLN
INTRAMUSCULAR | Status: AC
  Filled 2021-07-25: qty 5

## 2021-07-25 MED ORDER — ROCURONIUM BROMIDE 10 MG/ML (PF) SYRINGE
PREFILLED_SYRINGE | INTRAVENOUS | Status: AC
  Filled 2021-07-25: qty 10

## 2021-07-25 MED ORDER — ROCURONIUM BROMIDE 10 MG/ML (PF) SYRINGE
PREFILLED_SYRINGE | INTRAVENOUS | Status: DC | PRN
  Administered 2021-07-25: 20 mg via INTRAVENOUS
  Administered 2021-07-25: 50 mg via INTRAVENOUS

## 2021-07-25 MED ORDER — LIDOCAINE 2% (20 MG/ML) 5 ML SYRINGE
INTRAMUSCULAR | Status: DC | PRN
  Administered 2021-07-25: 60 mg via INTRAVENOUS

## 2021-07-25 MED ORDER — FENTANYL CITRATE (PF) 250 MCG/5ML IJ SOLN
INTRAMUSCULAR | Status: DC | PRN
  Administered 2021-07-25: 100 ug via INTRAVENOUS

## 2021-07-25 MED ORDER — METHOCARBAMOL 1000 MG/10ML IJ SOLN
500.0000 mg | Freq: Four times a day (QID) | INTRAVENOUS | Status: DC | PRN
  Filled 2021-07-25: qty 5

## 2021-07-25 MED ORDER — VANCOMYCIN HCL 1000 MG IV SOLR
INTRAVENOUS | Status: AC
  Filled 2021-07-25: qty 20

## 2021-07-25 MED ORDER — LIDOCAINE 2% (20 MG/ML) 5 ML SYRINGE
INTRAMUSCULAR | Status: AC
  Filled 2021-07-25: qty 5

## 2021-07-25 MED ORDER — CEFAZOLIN SODIUM-DEXTROSE 1-4 GM/50ML-% IV SOLN
1.0000 g | Freq: Four times a day (QID) | INTRAVENOUS | Status: AC
  Administered 2021-07-25 – 2021-07-26 (×3): 1 g via INTRAVENOUS
  Filled 2021-07-25 (×3): qty 50

## 2021-07-25 MED ORDER — ONDANSETRON HCL 4 MG/2ML IJ SOLN
4.0000 mg | Freq: Four times a day (QID) | INTRAMUSCULAR | Status: DC | PRN
  Administered 2021-07-26: 4 mg via INTRAVENOUS
  Filled 2021-07-25: qty 2

## 2021-07-25 MED ORDER — HYDROMORPHONE HCL 1 MG/ML IJ SOLN
0.5000 mg | INTRAMUSCULAR | Status: DC | PRN
  Administered 2021-07-26: 1 mg via INTRAVENOUS
  Filled 2021-07-25: qty 1

## 2021-07-25 MED ORDER — MIDAZOLAM HCL 2 MG/2ML IJ SOLN
INTRAMUSCULAR | Status: AC
  Filled 2021-07-25: qty 2

## 2021-07-25 MED ORDER — DOCUSATE SODIUM 100 MG PO CAPS
100.0000 mg | ORAL_CAPSULE | Freq: Two times a day (BID) | ORAL | Status: DC
  Administered 2021-07-25 – 2021-07-27 (×4): 100 mg via ORAL
  Filled 2021-07-25 (×4): qty 1

## 2021-07-25 MED ORDER — METHOCARBAMOL 500 MG PO TABS
500.0000 mg | ORAL_TABLET | Freq: Four times a day (QID) | ORAL | Status: DC | PRN
  Administered 2021-07-25 – 2021-07-26 (×2): 500 mg via ORAL
  Filled 2021-07-25 (×2): qty 1

## 2021-07-25 MED ORDER — ONDANSETRON HCL 4 MG/2ML IJ SOLN
INTRAMUSCULAR | Status: DC | PRN
  Administered 2021-07-25: 4 mg via INTRAVENOUS

## 2021-07-25 MED ORDER — PHENOL 1.4 % MT LIQD: 1.0000 | OROMUCOSAL | Status: DC | PRN

## 2021-07-25 MED ORDER — ORAL CARE MOUTH RINSE: 15.0000 mL | Freq: Once | OROMUCOSAL | Status: AC

## 2021-07-25 MED ORDER — MIDAZOLAM HCL 2 MG/2ML IJ SOLN
INTRAMUSCULAR | Status: AC
  Administered 2021-07-25: 2 mg via INTRAVENOUS
  Filled 2021-07-25: qty 2

## 2021-07-25 MED ORDER — PHENYLEPHRINE 40 MCG/ML (10ML) SYRINGE FOR IV PUSH (FOR BLOOD PRESSURE SUPPORT)
PREFILLED_SYRINGE | INTRAVENOUS | Status: DC | PRN
  Administered 2021-07-25: 200 ug via INTRAVENOUS

## 2021-07-25 MED ORDER — DEXAMETHASONE SODIUM PHOSPHATE 10 MG/ML IJ SOLN
INTRAMUSCULAR | Status: AC
  Filled 2021-07-25: qty 2

## 2021-07-25 MED ORDER — PROMETHAZINE HCL 25 MG/ML IJ SOLN
6.2500 mg | INTRAMUSCULAR | Status: DC | PRN
Start: 2021-07-25 — End: 2021-07-25

## 2021-07-25 MED ORDER — BUPIVACAINE LIPOSOME 1.3 % IJ SUSP
INTRAMUSCULAR | Status: DC | PRN
  Administered 2021-07-25: 10 mg via PERINEURAL

## 2021-07-25 MED ORDER — ACETAMINOPHEN 325 MG PO TABS
325.0000 mg | ORAL_TABLET | Freq: Four times a day (QID) | ORAL | Status: DC | PRN
Start: 2021-07-27 — End: 2021-07-27

## 2021-07-25 MED ORDER — ACETAMINOPHEN 325 MG PO TABS: 325.0000 mg | ORAL_TABLET | Freq: Four times a day (QID) | ORAL | Status: DC | PRN

## 2021-07-25 MED ORDER — BUPIVACAINE HCL (PF) 0.5 % IJ SOLN
INTRAMUSCULAR | Status: DC | PRN
  Administered 2021-07-25: 10 mL via PERINEURAL

## 2021-07-25 MED ORDER — SODIUM CHLORIDE 0.9 % IR SOLN
Status: DC | PRN
  Administered 2021-07-25: 1000 mL

## 2021-07-25 MED ORDER — MENTHOL 3 MG MT LOZG: 1.0000 | LOZENGE | OROMUCOSAL | Status: DC | PRN

## 2021-07-25 MED ORDER — ZOLPIDEM TARTRATE 5 MG PO TABS: 5.0000 mg | ORAL_TABLET | Freq: Every evening | ORAL | Status: DC | PRN

## 2021-07-25 MED ORDER — DIPHENHYDRAMINE HCL 12.5 MG/5ML PO ELIX: 12.5000 mg | ORAL_SOLUTION | ORAL | Status: DC | PRN

## 2021-07-25 MED ORDER — FENTANYL CITRATE (PF) 100 MCG/2ML IJ SOLN: 50.0000 ug | Freq: Once | INTRAMUSCULAR | Status: AC

## 2021-07-25 MED ORDER — CHLORHEXIDINE GLUCONATE 0.12 % MT SOLN: 15.0000 mL | Freq: Once | OROMUCOSAL | Status: AC

## 2021-07-25 MED ORDER — POLYETHYLENE GLYCOL 3350 17 G PO PACK
17.0000 g | PACK | Freq: Two times a day (BID) | ORAL | Status: AC
  Administered 2021-07-25 – 2021-07-26 (×2): 17 g via ORAL
  Filled 2021-07-25 (×4): qty 1

## 2021-07-25 MED ORDER — FENTANYL CITRATE (PF) 100 MCG/2ML IJ SOLN: 25.0000 ug | INTRAMUSCULAR | Status: DC | PRN

## 2021-07-25 MED ORDER — OXYCODONE HCL 5 MG PO TABS
10.0000 mg | ORAL_TABLET | ORAL | Status: DC | PRN
  Administered 2021-07-25: 15 mg via ORAL
  Administered 2021-07-26: 10 mg via ORAL
  Administered 2021-07-26 – 2021-07-27 (×2): 15 mg via ORAL
  Filled 2021-07-25 (×4): qty 3
  Filled 2021-07-25: qty 2
  Filled 2021-07-25: qty 3

## 2021-07-25 MED ORDER — ONDANSETRON HCL 4 MG PO TABS: 4.0000 mg | ORAL_TABLET | Freq: Four times a day (QID) | ORAL | Status: DC | PRN

## 2021-07-25 SURGICAL SUPPLY — 75 items
AID PSTN UNV HD RSTRNT DISP (MISCELLANEOUS) ×1
APL PRP STRL LF DISP 70% ISPRP (MISCELLANEOUS) ×2
BAG COUNTER SPONGE SURGICOUNT (BAG) ×2 IMPLANT
BAG SPNG CNTER NS LX DISP (BAG) ×1
BASEPLATE GLENOSPHERE 25 STD (Miscellaneous) ×1 IMPLANT
BIT DRILL 3.2 PERIPHERAL SCREW (BIT) IMPLANT
BLADE SAW SAG 73X25 THK (BLADE)
BLADE SAW SGTL 73X25 THK (BLADE) ×1 IMPLANT
BSPLAT GLND STD 25 RVRS SHLDR (Miscellaneous) ×1 IMPLANT
CHLORAPREP W/TINT 26 (MISCELLANEOUS) ×4 IMPLANT
COVER SURGICAL LIGHT HANDLE (MISCELLANEOUS) ×2 IMPLANT
COVER TRANSDUCER ULTRASND GEL (DISPOSABLE) ×1 IMPLANT
COVER ULTRASOUND SURGICAL (MISCELLANEOUS) ×1 IMPLANT
DRAPE C-ARM 42X72 X-RAY (DRAPES) IMPLANT
DRAPE HALF SHEET 40X57 (DRAPES) ×2 IMPLANT
DRAPE INCISE IOBAN 66X45 STRL (DRAPES) ×4 IMPLANT
DRAPE ORTHO SPLIT 77X108 STRL (DRAPES) ×4
DRAPE SURG ORHT 6 SPLT 77X108 (DRAPES) ×2 IMPLANT
DRAPE SWITCH (DRAPES) ×2 IMPLANT
DRAPE U-SHAPE 47X51 STRL (DRAPES) IMPLANT
DRSG AQUACEL AG ADV 3.5X 6 (GAUZE/BANDAGES/DRESSINGS) ×2 IMPLANT
ELECT BLADE 4.0 EZ CLEAN MEGAD (MISCELLANEOUS) ×2
ELECT REM PT RETURN 9FT ADLT (ELECTROSURGICAL) ×2
ELECTRODE BLDE 4.0 EZ CLN MEGD (MISCELLANEOUS) IMPLANT
ELECTRODE REM PT RTRN 9FT ADLT (ELECTROSURGICAL) ×1 IMPLANT
GLENOSPHERE REV SHOULDER 36 (Joint) ×1 IMPLANT
GLOVE SRG 8 PF TXTR STRL LF DI (GLOVE) ×1 IMPLANT
GLOVE SURG ENC MOIS LTX SZ6.5 (GLOVE) ×2 IMPLANT
GLOVE SURG LTX SZ8 (GLOVE) ×4 IMPLANT
GLOVE SURG UNDER LTX SZ6.5 (GLOVE) ×2 IMPLANT
GLOVE SURG UNDER POLY LF SZ8 (GLOVE) ×2
GOWN STRL REUS W/ TWL LRG LVL3 (GOWN DISPOSABLE) ×1 IMPLANT
GOWN STRL REUS W/ TWL XL LVL3 (GOWN DISPOSABLE) IMPLANT
GOWN STRL REUS W/TWL LRG LVL3 (GOWN DISPOSABLE) ×6
GOWN STRL REUS W/TWL XL LVL3 (GOWN DISPOSABLE) ×2
GUIDEWIRE GLENOID 2.5X220 (WIRE) ×1 IMPLANT
HANDPIECE INTERPULSE COAX TIP (DISPOSABLE) ×2
IMPL REVERSE SHOULDER 0X3.5 (Shoulder) IMPLANT
IMPLANT REVERSE SHOULDER 0X3.5 (Shoulder) ×2 IMPLANT
INSERT HUMERAL 36X6MM 12.5DEG (Insert) ×1 IMPLANT
KIT BASIN OR (CUSTOM PROCEDURE TRAY) ×2 IMPLANT
KIT STABILIZATION SHOULDER (MISCELLANEOUS) ×2 IMPLANT
KIT TURNOVER KIT B (KITS) ×2 IMPLANT
MANIFOLD NEPTUNE II (INSTRUMENTS) ×2 IMPLANT
NDL HYPO 25GX1X1/2 BEV (NEEDLE) IMPLANT
NDL MAYO TROCAR (NEEDLE) IMPLANT
NEEDLE HYPO 25GX1X1/2 BEV (NEEDLE) IMPLANT
NEEDLE MAYO TROCAR (NEEDLE) ×2 IMPLANT
NS IRRIG 1000ML POUR BTL (IV SOLUTION) ×3 IMPLANT
PACK SHOULDER (CUSTOM PROCEDURE TRAY) ×2 IMPLANT
PAD ARMBOARD 7.5X6 YLW CONV (MISCELLANEOUS) ×2 IMPLANT
RESTRAINT HEAD UNIVERSAL NS (MISCELLANEOUS) ×2 IMPLANT
SCREW 5.5X22 (Screw) ×1 IMPLANT
SCREW BONE 6.5 OD 30 NON BIO (Screw) ×1 IMPLANT
SCREW PERIPHERAL 5.0X34 (Screw) ×1 IMPLANT
SET HNDPC FAN SPRY TIP SCT (DISPOSABLE) ×1 IMPLANT
SET SHOULDER TRACTION (MISCELLANEOUS) ×1 IMPLANT
SLING ARM IMMOBILIZER LRG (SOFTGOODS) ×2 IMPLANT
SPONGE T-LAP 18X18 ~~LOC~~+RFID (SPONGE) ×2 IMPLANT
STEM HUMERAL 3B LONG 98 (Stem) IMPLANT
STEM HUMERAL SZ 3B LONG 98MM (Stem) ×2 IMPLANT
STRIP CLOSURE SKIN 1/2X4 (GAUZE/BANDAGES/DRESSINGS) ×2 IMPLANT
SUCTION FRAZIER HANDLE 10FR (MISCELLANEOUS)
SUCTION TUBE FRAZIER 10FR DISP (MISCELLANEOUS) IMPLANT
SUT ETHIBOND 2 V 37 (SUTURE) ×2 IMPLANT
SUT ETHIBOND NAB CT1 #1 30IN (SUTURE) ×2 IMPLANT
SUT FIBERWIRE #5 38 CONV NDL (SUTURE) ×12
SUT MNCRL AB 3-0 PS2 18 (SUTURE) ×2 IMPLANT
SUT VIC AB 2-0 CT1 27 (SUTURE) ×2
SUT VIC AB 2-0 CT1 TAPERPNT 27 (SUTURE) ×1 IMPLANT
SUTURE FIBERWR #5 38 CONV NDL (SUTURE) ×4 IMPLANT
TOWEL GREEN STERILE (TOWEL DISPOSABLE) ×2 IMPLANT
TRAY FOLEY W/BAG SLVR 14FR (SET/KITS/TRAYS/PACK) IMPLANT
WATER STERILE IRR 1000ML POUR (IV SOLUTION) ×1 IMPLANT
YANKAUER SUCT BULB TIP NO VENT (SUCTIONS) ×2 IMPLANT

## 2021-07-25 NOTE — Anesthesia Preprocedure Evaluation (Addendum)
Anesthesia Evaluation  Patient identified by MRN, date of birth, ID band Patient awake    Reviewed: Allergy & Precautions, NPO status , Patient's Chart, lab work & pertinent test results  Airway Mallampati: II  TM Distance: >3 FB Neck ROM: Full    Dental no notable dental hx. (+) Dental Advisory Given, Teeth Intact   Pulmonary asthma ,    Pulmonary exam normal breath sounds clear to auscultation       Cardiovascular hypertension, Pt. on medications Normal cardiovascular exam+ Valvular Problems/Murmurs MR  Rhythm:Regular Rate:Normal  Echo 05/2018 Left ventricle: The cavity size was normal. Wall thickness was increased in a pattern of moderate LVH. Systolic function was normal. The estimated ejection fraction was in the range of 55% to 60%. Wall motion was normal; there were no regional wallmotion abnormalities. Doppler parameters are consistent with abnormal left ventricular relaxation (grade 1 diastolic dysfunction).  - Ascending aorta: The ascending aorta was mildly dilated.  - Mitral valve: There was mild regurgitation.   Neuro/Psych PSYCHIATRIC DISORDERS Anxiety  Neuromuscular disease    GI/Hepatic Neg liver ROS, GERD  ,  Endo/Other  Hypothyroidism   Renal/GU negative Renal ROS     Musculoskeletal negative musculoskeletal ROS (+) Ankle Fx Left Left proximal humerus fracture    Abdominal   Peds  Hematology  (+) Blood dyscrasia, anemia ,   Anesthesia Other Findings Day of surgery medications reviewed with the patient.  Reproductive/Obstetrics                           Anesthesia Physical  Anesthesia Plan  ASA: 2  Anesthesia Plan: General   Post-op Pain Management: Regional block and Tylenol PO (pre-op)   Induction: Intravenous  PONV Risk Score and Plan: 3 and Midazolam, Dexamethasone, Ondansetron and Treatment may vary due to age or medical condition  Airway Management  Planned: Oral ETT  Additional Equipment: None  Intra-op Plan:   Post-operative Plan: Extubation in OR  Informed Consent: I have reviewed the patients History and Physical, chart, labs and discussed the procedure including the risks, benefits and alternatives for the proposed anesthesia with the patient or authorized representative who has indicated his/her understanding and acceptance.     Dental advisory given  Plan Discussed with: CRNA  Anesthesia Plan Comments:        Anesthesia Quick Evaluation

## 2021-07-25 NOTE — Anesthesia Procedure Notes (Signed)
Procedure Name: Intubation Date/Time: 07/25/2021 10:31 AM Performed by: Inda Coke, CRNA Pre-anesthesia Checklist: Patient identified, Emergency Drugs available, Suction available and Patient being monitored Patient Re-evaluated:Patient Re-evaluated prior to induction Oxygen Delivery Method: Circle System Utilized Preoxygenation: Pre-oxygenation with 100% oxygen Induction Type: IV induction Ventilation: Mask ventilation without difficulty Laryngoscope Size: Mac and 3 Grade View: Grade II Tube type: Oral Tube size: 7.0 mm Number of attempts: 2 Airway Equipment and Method: Stylet and Oral airway Placement Confirmation: ETT inserted through vocal cords under direct vision, positive ETCO2 and breath sounds checked- equal and bilateral Secured at: 22 cm Tube secured with: Tape Dental Injury: Teeth and Oropharynx as per pre-operative assessment

## 2021-07-25 NOTE — Anesthesia Procedure Notes (Signed)
Anesthesia Regional Block: Interscalene brachial plexus block   Pre-Anesthetic Checklist: , timeout performed,  Correct Patient, Correct Site, Correct Laterality,  Correct Procedure, Correct Position, site marked,  Risks and benefits discussed,  Surgical consent,  Pre-op evaluation,  At surgeon's request and post-op pain management  Laterality: Upper and Left  Prep: chloraprep       Needles:  Injection technique: Single-shot  Needle Type: Stimulator Needle - 40     Needle Length: 4cm  Needle Gauge: 22     Additional Needles:   Procedures:,,,, ultrasound used (permanent image in chart),,    Narrative:  Start time: 07/25/2021 9:23 AM End time: 07/25/2021 9:43 AM Injection made incrementally with aspirations every 5 mL.  Performed by: Personally  Anesthesiologist: Lewie Loron, MD  Additional Notes: BP cuff, SpO2 and EKG monitors applied. Sedation begun. Nerve location verified with ultrasound. Anesthetic injected incrementally, slowly, and after neg aspirations under direct u/s guidance. Good perineural spread. Tolerated well.

## 2021-07-25 NOTE — Plan of Care (Signed)

## 2021-07-25 NOTE — Op Note (Signed)
Orthopaedic Surgery Operative Note (CSN: [id number])  Kathryn Boyer  1955/11/15 Date of Surgery: 07/20/2021 - 07/25/2021   Diagnoses:  Left proximal humerus fracture with head split  Procedure: Left reverse total Shoulder Arthroplasty   Operative Finding Successful completion of planned procedure.  Patient's head split precluded open reduction internal fixation.  Her biceps were traumatically ruptured.  We were able to clear most of the bony debris from the inferior aspect of the capsule however some of it was attached to the capsule and we did not feel it was safe to remove all of this as it could cause damage to the axillary nerve.  We had a robust repair of the tuberosities.  Stability of the joint was appropriate.  Tug test was normal preoperatively.  Post-operative plan: The patient will be NWB in sling.  The patient will be will be admitted due to medical issues and pain control overnight.  DVT prophylaxis per primary team, no orthopedic contraindications.  Pain control with PRN pain medication preferring oral medicines.  Follow up plan will be scheduled in approximately 7 days for incision check and XR.  Physical therapy to start after first visit.  Implants: Tornier size 3B long stem, 0 high offset tray with 36+6 polyethylene, 25 standard baseplate with a 30 mm center screw and a 36 standard glenosphere  Post-Op Diagnosis: Same Surgeons:Primary: Bjorn Pippin, MD Assistants:Caroline McBane PA-C Location: [facility] OR ROOM 06 Anesthesia: General with Exparel Interscalene Antibiotics: Ancef 2g preop, Vancomycin 1000mg  locally Tourniquet time: None Estimated Blood Loss: 100 Complications: None Specimens: None Implants: Implant Name Type Inv. Item Serial No. Manufacturer Lot No. LRB No. Used Action  BASEPLATE GLENOSPHERE STD - Miscellaneous BASEPLATE GLENOSPHERE [id number] STD [id number] [employer]  Left 1 Implanted  GLENOSPHERE REV SHOULDER 36 - Joint GLENOSPHERE REV  SHOULDER 36 [id number] [employer]  Left 1 Implanted  INSERT HUMERAL 36X6MM 12.5DEG - [id number] Insert INSERT HUMERAL 36X6MM 12.5DEG [id number] [employer]  Left 1 Implanted  IMPLANT REVERSE SHOULDER 0X3.5 - [id number] Shoulder IMPLANT REVERSE SHOULDER 0X3.5 [id number] [employer]  Left 1 Implanted  STEM HUMERAL SZ 3B LONG [id number] - Stem STEM HUMERAL SZ 3B LONG [id number] [employer]  Left 1 Implanted    Indications for Surgery:   Kathryn Boyer is a 65 y.o. female with fall resulting in multiple traumatic injuries including a head split left proximal humerus fracture.  Benefits and risks of operative and nonoperative management were discussed prior to surgery with patient/guardian(s) and informed consent form was completed.  Infection and need for further surgery were discussed as was prosthetic stability and cuff issues.  We additionally specifically discussed risks of axillary nerve injury, infection, periprosthetic fracture, continued pain and longevity of implants prior to beginning procedure.      Procedure:   The patient was identified in the preoperative holding area where the surgical site was marked. Block placed by anesthesia with exparel.  The patient was taken to the OR where a procedural timeout was called and the above noted anesthesia was induced.  The patient was positioned beachchair on allen table with spider arm positioner.  Preoperative antibiotics were dosed.  The patient's left shoulder was prepped and draped in the usual sterile fashion.  A second preoperative timeout was called.       Standard deltopectoral approach was performed with a #10 blade. We dissected down to the subcutaneous tissues and the cephalic vein was taken laterally with the deltoid. Clavipectoral fascia was incised in line  with the incision. Deep retractors were placed. The long of the biceps tendon was identified and there was significant tenosynovitis present.  Tenodesis was performed to the  pectoralis tendon with #2 Ethibond. The remaining biceps was followed up into the rotator interval where it was released.    We used the bicipital groove as a landmark for the lesser and greater tuberosity fragments.  We were able to mobilize the lesser tuberosity fragment and placed stay sutures in the bone tendon junction to help with mobilization.  This point we were able to identify the  greater tuberosity fragment and 4 #5  FiberWire sutures were used to place into this for eventual repair of the tuberosities.  Once these were both mobilized we took care to identify the shaft fragment as well as the head fragment.  We carefully identified the head fragment were able to manually remove it.  At this point the axillary nerve was found and palpated and with a tug test noted to be intact.  Protected throughout the remainder of the case with blunt retractors.    We then released the SGHL with bovie cautery prior to placing a curved mayo at the junction of the anterior glenoid well above the axillary nerve and bluntly dissecting the subscapularis from the capsule.  We then carefully protected the axillary nerve as we gently released the inferior capsule to fully mobilize the subscapularis.  An anterior deltoid retractor was then placed as well as a small Hohmann retractor superiorly.   The glenoid was relatively preserved as we would expect in this fracture patient.  The remaining labrum was removed circumferentially taking great care not to disrupt the posterior capsule.    The glenoid drill guide was placed and used to drill a guide pin in the center, inferior position. The glenoid face was then reamed concentrically over the guide wire. The center hole was drilled over the guidepin in a near anatomic angle of version. Next the glenoid vault was drilled back to a depth of 30 mm.  We tapped and then placed a 25 size baseplate with 0 lateralization was selected with a 6.5 mm x 77mm length central screw.  The  base plate was screwed into the glenoid vault obtaining secure fixation. We next placed superior and inferior locking screws for additional fixation.  Next a 36 mm glenosphere was selected and impacted onto the baseplate. The center screw was tightened.   We then repositioned the arm to give access to the humeral shaft fragment.  Drill holes were placed and fiberwire sutures in the shaft for vertical fixation of the tuberosities.  We broached starting with a size one broach and broaching up to 3 which obtained an appropriate fit.   We trialed with multiple size tray and polyethylene options and selected a 0 high which provided good stability and range of motion without excess soft tissue tension. The offset was dialed in to match the normal anatomy. The shoulder was trialed.  There was good ROM in all planes and the shoulder was stable with no inferior translation.   We then mobilized her tuberosities again and placed the anterior deep limbs of the 4 #5 fiber wires around the stem.  1 of these was tied down fixing the greater tuberosity in place after bone graft harvest from the humeral head component was placed underneath.  A +0 high offset tray was selected and impacted onto the stem.   A 36+6 polyethylene liner was impacted onto the stem.  The joint  was reduced and thoroughly irrigated with pulsatile lavage. The remaining sutures were then placed through the subscapularis and the bone tendon junction and the tuberosities were reduced after bone graft placed beneath as autograft at the subscap.  We horizontally secured the tuberosities before placing vertical fixation with the suture that was placed into the shaft.  Tuberosities moved as a unit were happy with her overall reduction.  This was checked on fluoroscopy confirming our position.  We irrigated copiously at this point.  Hemostasis was obtained. The deltopectoral interval was reapproximated with #1 Ethibond. The subcutaneous tissues were closed with  3-0 Vicryl and the skin was closed with running monocryl.     The wounds were cleaned and dried and an Aquacel dressing was placed. The drapes taken down. The arm was placed into sling with abduction pillow. Patient was awakened, extubated, and transferred to the recovery room in stable condition. There were no intraoperative complications. The sponge, needle, and attention counts were correct at the end of the case.     Alfonse Alpers, PA-C, present and scrubbed throughout the case, critical for completion in a timely fashion, and for retraction, instrumentation, closure.

## 2021-07-25 NOTE — Progress Notes (Signed)
Orthopedic Tech Progress Note Patient Details:  Kathryn Boyer 09/19/1955 [id number]  OR RN called requesting an ABDUCTION SHOULDER PILLOW to be dropped off to PACU (since they was headed there with patient) gave it to the secretary and told PACU who it was for.   Ortho Devices Type of Ortho Device: Shoulder abduction pillow Ortho Device/Splint Location: LUE Ortho Device/Splint Interventions: Ordered   Post Interventions Patient Tolerated: Well Instructions Provided: Care of device  Donald Pore 07/25/2021, 12:00 PM

## 2021-07-25 NOTE — Transfer of Care (Signed)
Immediate Anesthesia Transfer of Care Note  Patient: Kathryn Boyer  Procedure(s) Performed: LEFT REVERSE SHOULDER ARTHROPLASTY (Left: Shoulder)  Patient Location: PACU  Anesthesia Type:General  Level of Consciousness: awake, alert  and oriented  Airway & Oxygen Therapy: Patient Spontanous Breathing  Post-op Assessment: Report given to RN and Post -op Vital signs reviewed and stable  Post vital signs: Reviewed and stable  Last Vitals:  Vitals Value Taken Time  BP 114/60 07/25/21 1203  Temp    Pulse 92 07/25/21 1205  Resp 22 07/25/21 1205  SpO2 94 % 07/25/21 1205  Vitals shown include unvalidated device data.  Last Pain:  Vitals:   07/25/21 0956  TempSrc:   PainSc: 7       Patients Stated Pain Goal: 3 (07/22/21 1752)  Complications: No notable events documented.

## 2021-07-25 NOTE — Interval H&P Note (Signed)
All questions answered

## 2021-07-25 NOTE — Progress Notes (Signed)
Report received and care assumed from previous shift. VS obtained, shift assessments completed - see flowsheets. PRN and scheduled pain medications given as needed - see MAR. Turns and repositions self in bed. LUE and LLE maintained NWB. RLE WBAT. L LE cast CDI, L shoulder dressing CDI. LUE in sling at all times. Iceman to L shoulder incision. Attempted to remove purewick this AM, but patient refusing until meets with PT. Educated patient on need to mobilize - verbalizes understanding. Patient currently resting in bed, bed in lowest position. Denies needs. Call bell within reach.

## 2021-07-25 NOTE — Progress Notes (Signed)
[facility] PROGRESS NOTE    Progress Note  Kathryn Boyer  KZS:[id number] DOB: Apr 27, 1956 DOA: 07/20/2021 PCP: Donato Schultz, DO     Brief Narrative:   Kathryn Boyer is an 65 y.o. female past medical history of hypothyroidism presents was along the ED on 07/21/2019 with complaint of ankle and shoulder pain following a mechanical fall in the ED imaging showed left humerus fracture, left tibial fracture, right foot metatarsal and lateral malleolus fracture with left foot trimalleolar ankle fracture and left scaphoid deformity orthopedic surgery was consulted and admitted by hospitalist    Assessment/Plan:   Accidental fall with subsequent polytrauma: Left ankle trimalleolar fracture/left humerus 4 compartment fracture, left medial tibial plateau fracture nondisplaced fracture of the fifth metatarsal bone, nondisplaced fracture of the lateral malleolus of the right fibula in the setting of history of osteoporosis: Status post surgical intervention on 07/23/2021 orthopedic surgery recommended nonweightbearing for 8 weeks. Left shoulder fracture for surgical intervention on 07/25/2021. Right lower extremity weightbearing with a cam boot. Pain control per orthopedic surgery and VTE prophylaxis per orthopedic surgery. Started on MiraLAX p.o. twice daily. Patient will probably qualify for inpatient rehab versus skilled nursing facility.  Essential hypertension: Holding antihypertensive medication for surgical intervention  Hyperlipidemia: Continue statins.  Hypothyroidism Continue Synthroid.  General anxiety: Continue benzos as needed.    DVT prophylaxis: Lovenox Family Communication:none Status is: Inpatient  Remains inpatient appropriate because: Trauma leading to multiple fractures in the setting of osteoporosis.        Code Status:     Code Status Orders  (From admission, onward)           Start     Ordered   07/20/21 1713  Full code  Continuous         07/20/21 1712           Code Status History     This patient has a current code status but no historical code status.      Advance Directive Documentation    Flowsheet Row Most Recent Value  Type of Advance Directive Healthcare Power of Attorney, Living will  Pre-existing out of facility DNR order (yellow form or pink MOST form) --  "MOST" Form in Place? --         IV Access:   Peripheral IV   Procedures and diagnostic studies:   DG Ankle Complete Left  Result Date: 07/23/2021 CLINICAL DATA:  Left ankle fracture or dislocation. EXAM: LEFT ANKLE COMPLETE - 3+ VIEW COMPARISON:  Fluoroscopic images of same day. FINDINGS: The left ankle has been casted and immobilized. Patient is status post surgical internal fixation of distal left tibial and fibular fractures. IMPRESSION: Status post surgical internal fixation of distal left fibular and tibial fractures. Electronically Signed   By: Lupita Raider M.D.   On: 07/23/2021 14:41     Medical Consultants:   None.   Subjective:    Kathryn Boyer status post surgical intervention sleepy and groggy  Objective:    Vitals:   07/25/21 0940 07/25/21 0945 07/25/21 0950 07/25/21 0955  BP: (!) 145/66 (!) 126/48 107/73 132/62  Pulse: 91 88 79 86  Resp: 12 18 17  (!) 21  Temp:      TempSrc:      SpO2: 96% (!) 89% 91% 91%  Weight:      Height:       SpO2: 91 % O2 Flow Rate (L/min): 4 L/min   Intake/Output Summary (Last 24 hours) at 07/25/2021 1143  Last data filed at 07/25/2021 0534 Gross per 24 hour  Intake --  Output 150 ml  Net -150 ml   Filed Weights   07/20/21 1034 07/25/21 0900  Weight: 80.7 kg 80.7 kg    Exam: General exam: In no acute distress.  Respiratory system: Good air movement and clear to auscultation. Cardiovascular system: S1 & S2 heard, RRR. No JVD. Gastrointestinal system: Abdomen is nondistended, soft and nontender.  Extremities: boot on  left leg. Left arm in sling Skin: No  rashes, lesions or ulcers   Data Reviewed:    Labs: Basic Metabolic Panel: Recent Labs  Lab 07/20/21 1131 07/21/21 0323 07/23/21 0338 07/23/21 1442 07/24/21 0125  NA 136 135 134*  --  139  K 3.9 4.3 4.2  --  4.1  CL 105 104 102  --  106  CO2 22 22 24   --  26  GLUCOSE 145* 125* 106*  --  122*  BUN 12 12 8   --  10  CREATININE 0.86 0.93 0.81 0.92 0.77  CALCIUM 8.8* 8.6* 8.4*  --  9.0   GFR Estimated Creatinine Clearance: 75.6 mL/min (by C-G formula based on SCr of 0.77 mg/dL). Liver Function Tests: Recent Labs  Lab 07/21/21 0323 07/24/21 0125  AST 27 26  ALT 22 19  ALKPHOS 67 63  BILITOT 0.8 0.8  PROT 6.6 5.9*  ALBUMIN 3.9 3.0*   No results for input(s): LIPASE, AMYLASE in the last 168 hours. No results for input(s): AMMONIA in the last 168 hours. Coagulation profile No results for input(s): INR, PROTIME in the last 168 hours. COVID-19 Labs  No results for input(s): DDIMER, FERRITIN, LDH, CRP in the last 72 hours.  Lab Results  Component Value Date   SARSCOV2NAA NEGATIVE 07/20/2021    CBC: Recent Labs  Lab 07/20/21 1131 07/21/21 0323 07/23/21 0338 07/23/21 1442 07/24/21 0125  WBC 14.0* 11.0* 7.6 7.2 9.7  NEUTROABS 11.3*  --   --   --   --   HGB 14.7 13.2 11.2* 10.2* 10.4*  HCT 43.4 40.2 33.2* 31.2* 31.0*  MCV 91.2 93.9 91.0 92.9 90.6  PLT 239 190 165 179 211   Cardiac Enzymes: No results for input(s): CKTOTAL, CKMB, CKMBINDEX, TROPONINI in the last 168 hours. BNP (last 3 results) No results for input(s): PROBNP in the last 8760 hours. CBG: No results for input(s): GLUCAP in the last 168 hours. D-Dimer: No results for input(s): DDIMER in the last 72 hours. Hgb A1c: No results for input(s): HGBA1C in the last 72 hours. Lipid Profile: No results for input(s): CHOL, HDL, LDLCALC, TRIG, CHOLHDL, LDLDIRECT in the last 72 hours. Thyroid function studies: Recent Labs    07/24/21 0125  TSH 0.400   Anemia work up: No results for input(s):  VITAMINB12, FOLATE, FERRITIN, TIBC, IRON, RETICCTPCT in the last 72 hours. Sepsis Labs: Recent Labs  Lab 07/21/21 0323 07/23/21 0338 07/23/21 1442 07/24/21 0125  WBC 11.0* 7.6 7.2 9.7   Microbiology Recent Results (from the past 240 hour(s))  Resp Panel by RT-PCR (Flu A&B, Covid) Nasopharyngeal Swab     Status: None   Collection Time: 07/20/21 11:31 AM   Specimen: Nasopharyngeal Swab; Nasopharyngeal(NP) swabs in vial transport medium  Result Value Ref Range Status   SARS Coronavirus 2 by RT PCR NEGATIVE NEGATIVE Final    Comment: (NOTE) SARS-CoV-2 target nucleic acids are NOT DETECTED.  The SARS-CoV-2 RNA is generally detectable in upper respiratory specimens during the acute phase of infection. The lowest concentration  of SARS-CoV-2 viral copies this assay can detect is 138 copies/mL. A negative result does not preclude SARS-Cov-2 infection and should not be used as the sole basis for treatment or other patient management decisions. A negative result may occur with  improper specimen collection/handling, submission of specimen other than nasopharyngeal swab, presence of viral mutation(s) within the areas targeted by this assay, and inadequate number of viral copies(<138 copies/mL). A negative result must be combined with clinical observations, patient history, and epidemiological information. The expected result is Negative.  Fact Sheet for Patients:  BloggerCourse.com  Fact Sheet for Healthcare Providers:  SeriousBroker.it  This test is no t yet approved or cleared by the [place] FDA and  has been authorized for detection and/or diagnosis of SARS-CoV-2 by FDA under an Emergency Use Authorization (EUA). This EUA will remain  in effect (meaning this test can be used) for the duration of the COVID-19 declaration under Section 564(b)(1) of the Act, 21 U.S.C.section 360bbb-3(b)(1), unless the authorization is terminated   or revoked sooner.       Influenza A by PCR NEGATIVE NEGATIVE Final   Influenza B by PCR NEGATIVE NEGATIVE Final    Comment: (NOTE) The Xpert Xpress SARS-CoV-2/FLU/RSV plus assay is intended as an aid in the diagnosis of influenza from Nasopharyngeal swab specimens and should not be used as a sole basis for treatment. Nasal washings and aspirates are unacceptable for Xpert Xpress SARS-CoV-2/FLU/RSV testing.  Fact Sheet for Patients: BloggerCourse.com  Fact Sheet for Healthcare Providers: SeriousBroker.it  This test is not yet approved or cleared by the [place] FDA and has been authorized for detection and/or diagnosis of SARS-CoV-2 by FDA under an Emergency Use Authorization (EUA). This EUA will remain in effect (meaning this test can be used) for the duration of the COVID-19 declaration under Section 564(b)(1) of the Act, 21 U.S.C. section 360bbb-3(b)(1), unless the authorization is terminated or revoked.  Performed at [facility], [address], [place], [place] [address]   Surgical pcr screen     Status: None   Collection Time: 07/23/21  7:45 AM   Specimen: Nasal Mucosa; Nasal Swab  Result Value Ref Range Status   MRSA, PCR NEGATIVE NEGATIVE Final   Staphylococcus aureus NEGATIVE NEGATIVE Final    Comment: (NOTE) The Xpert SA Assay (FDA approved for NASAL specimens in patients 35 years of age and older), is one component of a comprehensive surveillance program. It is not intended to diagnose infection nor to guide or monitor treatment. Performed at [facility], [address] [place], [place] [address]      Medications:    [MAR Hold] acetaminophen  1,000 mg Oral Q8H   [MAR Hold] vitamin C  500 mg Oral Daily   [MAR Hold] calcium citrate  200 mg of elemental calcium Oral BID   [MAR Hold] cholecalciferol  2,000 Units Oral BID   [MAR Hold] docusate sodium  100 mg Oral BID   [MAR  Hold] enoxaparin (LOVENOX) injection  40 mg Subcutaneous Q24H   [MAR Hold] levothyroxine  100 mcg Oral Q0600   [MAR Hold] methocarbamol  750 mg Oral TID   [MAR Hold] pravastatin  20 mg Oral Daily   Continuous Infusions:   ceFAZolin (ANCEF) IV     lactated ringers 10 mL/hr at 07/25/21 1020      LOS: 5 days   Marinda Elk  [facility]  07/25/2021, 11:43 AM

## 2021-07-25 NOTE — Progress Notes (Signed)
Inpatient Rehabilitation Admissions Coordinator   Inpatient rehab consult received., Noted in OR. I will follow up postoperatively to assess rehab venue options.  Ottie Glazier, RN, MSN Rehab Admissions Coordinator [phone] 07/25/2021 11:46 AM

## 2021-07-26 LAB — BASIC METABOLIC PANEL
Anion gap: 9 (ref 5–15)
BUN: 11 mg/dL (ref 8–23)
CO2: 27 mmol/L (ref 22–32)
Calcium: 8.6 mg/dL — ABNORMAL LOW (ref 8.9–10.3)
Chloride: 103 mmol/L (ref 98–111)
Creatinine, Ser: 0.93 mg/dL (ref 0.44–1.00)
GFR, Estimated: >60 mL/min (ref >60)
Glucose, Bld: 104 mg/dL — ABNORMAL HIGH (ref 70–99)
Potassium: 3.7 mmol/L (ref 3.5–5.1)
Sodium: 139 mmol/L (ref 135–145)

## 2021-07-26 LAB — CBC
HCT: 31 % — ABNORMAL LOW (ref 36.0–46.0)
Hemoglobin: 10.4 g/dL — ABNORMAL LOW (ref 12.0–15.0)
MCH: 30.7 pg (ref 26.0–34.0)
MCHC: 33.5 g/dL (ref 30.0–36.0)
MCV: 91.4 fL (ref 80.0–100.0)
Platelets: 267 10*3/uL (ref 150–400)
RBC: 3.39 MIL/uL — ABNORMAL LOW (ref 3.87–5.11)
RDW: 12.4 % (ref 11.5–15.5)
WBC: 9.4 10*3/uL (ref 4.0–10.5)
nRBC: 0 % (ref 0.0–0.2)

## 2021-07-26 MED ORDER — KETOROLAC TROMETHAMINE 15 MG/ML IJ SOLN
15.0000 mg | Freq: Four times a day (QID) | INTRAMUSCULAR | Status: AC
  Administered 2021-07-26 – 2021-07-27 (×4): 15 mg via INTRAVENOUS
  Filled 2021-07-26 (×4): qty 1

## 2021-07-26 MED ORDER — GABAPENTIN 100 MG PO CAPS
100.0000 mg | ORAL_CAPSULE | Freq: Three times a day (TID) | ORAL | Status: DC
  Administered 2021-07-26 – 2021-07-27 (×5): 100 mg via ORAL
  Filled 2021-07-26 (×5): qty 1

## 2021-07-26 MED ORDER — NAPROXEN 250 MG PO TABS
250.0000 mg | ORAL_TABLET | Freq: Two times a day (BID) | ORAL | Status: DC
  Administered 2021-07-27 (×2): 250 mg via ORAL
  Filled 2021-07-26 (×2): qty 1

## 2021-07-26 NOTE — Progress Notes (Signed)
[facility] PROGRESS NOTE    Progress Note  Kathryn Boyer  PYK:[id number] DOB: [birth year] DOA: 07/20/2021 PCP: Donato Schultz, DO     Brief Narrative:   Kathryn Boyer is an 65 y.o. female past medical history of hypothyroidism presents was along the ED on 07/21/2019 with complaint of ankle and shoulder pain following a mechanical fall in the ED imaging showed left humerus fracture, left tibial fracture, right foot metatarsal and lateral malleolus fracture with left foot trimalleolar ankle fracture and left scaphoid deformity orthopedic surgery was consulted and admitted by hospitalist    Assessment/Plan:   Accidental fall with subsequent polytrauma: Left ankle trimalleolar fracture/left humerus 4 compartment fracture, left medial tibial plateau fracture nondisplaced fracture of the fifth metatarsal bone, nondisplaced fracture of the lateral malleolus of the right fibula in the setting of history of osteoporosis: Further management per orthopedic surgery. Patient will probably qualify for inpatient rehab.  Essential hypertension: Continue to hold antihypertensive medication blood pressure is stable.  Hyperlipidemia: Continue statins.  Hypothyroidism Continue Synthroid.  General anxiety: Continue benzos as needed.    DVT prophylaxis: Lovenox Family Communication:none Status is: Inpatient  Remains inpatient appropriate because: Trauma leading to multiple fractures in the setting of osteoporosis.        Code Status:     Code Status Orders  (From admission, onward)           Start     Ordered   07/20/21 1713  Full code  Continuous        07/20/21 1712           Code Status History     This patient has a current code status but no historical code status.      Advance Directive Documentation    Flowsheet Row Most Recent Value  Type of Advance Directive Healthcare Power of Attorney, Living will  Pre-existing out of facility DNR order  (yellow form or pink MOST form) --  "MOST" Form in Place? --         IV Access:   Peripheral IV   Procedures and diagnostic studies:   DG Shoulder Left Port  Result Date: 07/25/2021 CLINICAL DATA:  Postoperative imaging for reverse shoulder arthroplasty in a 65 year old female. EXAM: LEFT SHOULDER COMPARISON:  CT of the shoulder of July 21, 2021. FINDINGS: Immediate postoperative imaging of the LEFT shoulder in single view shows interval insertion of a reverse shoulder arthroplasty. Greater tuberosity fragment is seen along the superior and lateral margin of the bone prosthetic interface in the humerus. Shoulder component appears well positioned based on projection without signs of visible fracture in AP projection. Lung volumes incidentally appears a low and are incompletely evaluated in the chest. IMPRESSION: Post reverse shoulder arthroplasty with expected postoperative changes. Fracture fragment of greater tuberosity along the superior margin of the humeral component near the prosthetic interface. Electronically Signed   By: Donzetta Kohut M.D.   On: 07/25/2021 14:27     Medical Consultants:   None.   Subjective:    Kathryn Boyer no complaints  Objective:    Vitals:   07/25/21 1315 07/25/21 2100 07/26/21 0434 07/26/21 0815  BP: 119/74 120/64 108/62 111/66  Pulse: 89 92 78 77  Resp: 18 18 20 17   Temp: 98.4 F (36.9 C) 98 F (36.7 C) 98.3 F (36.8 C) 98.1 F (36.7 C)  TempSrc: Oral Oral Oral Oral  SpO2: 98% 98% 93% 93%  Weight:      Height:  SpO2: 93 % O2 Flow Rate (L/min): 2 L/min   Intake/Output Summary (Last 24 hours) at 07/26/2021 1010 Last data filed at 07/26/2021 0250 Gross per 24 hour  Intake 2162.5 ml  Output 2025 ml  Net 137.5 ml    Filed Weights   07/20/21 1034 07/25/21 0900  Weight: 80.7 kg 80.7 kg    Exam: General exam: In no acute distress. Respiratory system: Good air movement and clear to auscultation. Cardiovascular system:  S1 & S2 heard, RRR. No JVD. Gastrointestinal system: Abdomen is nondistended, soft and nontender.  Extremities: No pedal edema. Skin: No rashes, lesions or ulcers   Data Reviewed:    Labs: Basic Metabolic Panel: Recent Labs  Lab 07/20/21 1131 07/21/21 0323 07/23/21 0338 07/23/21 1442 07/24/21 0125 07/25/21 2008 07/26/21 0837  NA 136 135 134*  --  139  --  139  K 3.9 4.3 4.2  --  4.1  --  3.7  CL 105 104 102  --  106  --  103  CO2 22 22 24   --  26  --  27  GLUCOSE 145* 125* 106*  --  122*  --  104*  BUN 12 12 8   --  10  --  11  CREATININE 0.86 0.93 0.81 0.92 0.77 0.85 0.93  CALCIUM 8.8* 8.6* 8.4*  --  9.0  8.7  --  8.6*    GFR Estimated Creatinine Clearance: 65 mL/min (by C-G formula based on SCr of 0.93 mg/dL). Liver Function Tests: Recent Labs  Lab 07/21/21 0323 07/24/21 0125  AST 27 26  ALT 22 19  ALKPHOS 67 63  BILITOT 0.8 0.8  PROT 6.6 5.9*  ALBUMIN 3.9 3.0*    No results for input(s): LIPASE, AMYLASE in the last 168 hours. No results for input(s): AMMONIA in the last 168 hours. Coagulation profile No results for input(s): INR, PROTIME in the last 168 hours. COVID-19 Labs  No results for input(s): DDIMER, FERRITIN, LDH, CRP in the last 72 hours.  Lab Results  Component Value Date   SARSCOV2NAA NEGATIVE 07/20/2021    CBC: Recent Labs  Lab 07/20/21 1131 07/21/21 0323 07/23/21 0338 07/23/21 1442 07/24/21 0125 07/25/21 2008 07/26/21 0837  WBC 14.0*   < > 7.6 7.2 9.7 9.6 9.4  NEUTROABS 11.3*  --   --   --   --   --   --   HGB 14.7   < > 11.2* 10.2* 10.4* 11.4* 10.4*  HCT 43.4   < > 33.2* 31.2* 31.0* 34.0* 31.0*  MCV 91.2   < > 91.0 92.9 90.6 92.1 91.4  PLT 239   < > 165 179 211 291 267   < > = values in this interval not displayed.    Cardiac Enzymes: No results for input(s): CKTOTAL, CKMB, CKMBINDEX, TROPONINI in the last 168 hours. BNP (last 3 results) No results for input(s): PROBNP in the last 8760 hours. CBG: No results for  input(s): GLUCAP in the last 168 hours. D-Dimer: No results for input(s): DDIMER in the last 72 hours. Hgb A1c: No results for input(s): HGBA1C in the last 72 hours. Lipid Profile: No results for input(s): CHOL, HDL, LDLCALC, TRIG, CHOLHDL, LDLDIRECT in the last 72 hours. Thyroid function studies: Recent Labs    07/24/21 0125  TSH 0.400    Anemia work up: No results for input(s): VITAMINB12, FOLATE, FERRITIN, TIBC, IRON, RETICCTPCT in the last 72 hours. Sepsis Labs: Recent Labs  Lab 07/23/21 1442 07/24/21 0125 07/25/21 2008 07/26/21  0837  WBC 7.2 9.7 9.6 9.4    Microbiology Recent Results (from the past 240 hour(s))  Resp Panel by RT-PCR (Flu A&B, Covid) Nasopharyngeal Swab     Status: None   Collection Time: 07/20/21 11:31 AM   Specimen: Nasopharyngeal Swab; Nasopharyngeal(NP) swabs in vial transport medium  Result Value Ref Range Status   SARS Coronavirus 2 by RT PCR NEGATIVE NEGATIVE Final    Comment: (NOTE) SARS-CoV-2 target nucleic acids are NOT DETECTED.  The SARS-CoV-2 RNA is generally detectable in upper respiratory specimens during the acute phase of infection. The lowest concentration of SARS-CoV-2 viral copies this assay can detect is 138 copies/mL. A negative result does not preclude SARS-Cov-2 infection and should not be used as the sole basis for treatment or other patient management decisions. A negative result may occur with  improper specimen collection/handling, submission of specimen other than nasopharyngeal swab, presence of viral mutation(s) within the areas targeted by this assay, and inadequate number of viral copies(<138 copies/mL). A negative result must be combined with clinical observations, patient history, and epidemiological information. The expected result is Negative.  Fact Sheet for Patients:  BloggerCourse.com  Fact Sheet for Healthcare Providers:  SeriousBroker.it  This test is  no t yet approved or cleared by the [place] FDA and  has been authorized for detection and/or diagnosis of SARS-CoV-2 by FDA under an Emergency Use Authorization (EUA). This EUA will remain  in effect (meaning this test can be used) for the duration of the COVID-19 declaration under Section 564(b)(1) of the Act, 21 U.S.C.section 360bbb-3(b)(1), unless the authorization is terminated  or revoked sooner.       Influenza A by PCR NEGATIVE NEGATIVE Final   Influenza B by PCR NEGATIVE NEGATIVE Final    Comment: (NOTE) The Xpert Xpress SARS-CoV-2/FLU/RSV plus assay is intended as an aid in the diagnosis of influenza from Nasopharyngeal swab specimens and should not be used as a sole basis for treatment. Nasal washings and aspirates are unacceptable for Xpert Xpress SARS-CoV-2/FLU/RSV testing.  Fact Sheet for Patients: BloggerCourse.com  Fact Sheet for Healthcare Providers: SeriousBroker.it  This test is not yet approved or cleared by the [place] FDA and has been authorized for detection and/or diagnosis of SARS-CoV-2 by FDA under an Emergency Use Authorization (EUA). This EUA will remain in effect (meaning this test can be used) for the duration of the COVID-19 declaration under Section 564(b)(1) of the Act, 21 U.S.C. section 360bbb-3(b)(1), unless the authorization is terminated or revoked.  Performed at [facility], [address], [place], [place] [address]   Surgical pcr screen     Status: None   Collection Time: 07/23/21  7:45 AM   Specimen: Nasal Mucosa; Nasal Swab  Result Value Ref Range Status   MRSA, PCR NEGATIVE NEGATIVE Final   Staphylococcus aureus NEGATIVE NEGATIVE Final    Comment: (NOTE) The Xpert SA Assay (FDA approved for NASAL specimens in patients 31 years of age and older), is one component of a comprehensive surveillance program. It is not intended to diagnose infection nor  to guide or monitor treatment. Performed at [facility], [address], [place], [place] [address]      Medications:    acetaminophen  1,000 mg Oral Q8H   vitamin C  500 mg Oral Daily   calcium citrate  200 mg of elemental calcium Oral BID   cholecalciferol  2,000 Units Oral BID   docusate sodium  100 mg Oral BID  enoxaparin (LOVENOX) injection  40 mg Subcutaneous Q24H   gabapentin  100 mg Oral TID   ketorolac  15 mg Intravenous Q6H   levothyroxine  100 mcg Oral Q0600   methocarbamol  750 mg Oral TID   [START ON 07/27/2021] naproxen  250 mg Oral BID WC   polyethylene glycol  17 g Oral BID   pravastatin  20 mg Oral Daily   Continuous Infusions:      LOS: 6 days   Marinda Elk  [facility]  07/26/2021, 10:10 AM

## 2021-07-26 NOTE — Anesthesia Postprocedure Evaluation (Signed)
Anesthesia Post Note  Patient: Kathryn Boyer  Procedure(s) Performed: LEFT REVERSE SHOULDER ARTHROPLASTY (Left: Shoulder)     Patient location during evaluation: PACU Anesthesia Type: General Level of consciousness: sedated and patient cooperative Pain management: pain level controlled Vital Signs Assessment: post-procedure vital signs reviewed and stable Respiratory status: spontaneous breathing Cardiovascular status: stable Anesthetic complications: no   No notable events documented.  Last Vitals:  Vitals:   07/26/21 0815 07/26/21 1220  BP: 111/66 124/60  Pulse: 77 86  Resp: 17 18  Temp: 36.7 C 36.9 C  SpO2: 93% 93%    Last Pain:  Vitals:   07/26/21 1220  TempSrc: Oral  PainSc:                  Lewie Loron

## 2021-07-26 NOTE — Progress Notes (Signed)
Orthopedic Tech Progress Note Patient Details:  Kathryn Boyer 1956-05-24 [id number]  Patient ID: Kathryn Boyer, female   DOB: [birth year], 65 y.o.   MRN: [id number]  Kathryn Boyer Kathryn Boyer 07/26/2021, 3:57 PM Patient already had sling and was wearing it when we got into room after waiting outside while PT was doing patient care.

## 2021-07-26 NOTE — Progress Notes (Signed)
Inpatient Rehabilitation Admissions Coordinator   I met at bedside with patient to discuss CIR goals and expectations for training prior to d/c home with spouse. She does not want SNF and would hire assist at home when husband returns to work. I await postop PT notes and then will begin Auth with [employer] for a possible CIR admit .  Danne Baxter, RN, MSN Rehab Admissions Coordinator [phone] 07/26/2021 4:18 PM

## 2021-07-26 NOTE — Progress Notes (Signed)
Physical Therapy Treatment Patient Details Name: Kathryn Boyer MRN: [id number] DOB: 12-Sep-1955 Today's Date: 07/26/2021   History of Present Illness 65 y/o female who presents 11/25 to [facility] with multiple fxs s/p fall - left humerus fx, right foot/lateral malleolus fx and left tibial plateau fx. Transfer to [facility] for surgical intervention. s/p ORIF LLE 11/28. S/p left shoulder replacement on 11/30. PMH includes osteoporosis, HTN, anxiety.    PT Comments    Pt eager to mobilize out of chair to bed, and practice LE exercises. Pt overall requiring min assist for transfer-level mobility, requires frequent cues throughout mobility for form and safety. Pt tolerated LE exercises for strengthening and ROM well, requests handout and will give to pt tomorrow.     Recommendations for follow up therapy are one component of a multi-disciplinary discharge planning process, led by the attending physician.  Recommendations may be updated based on patient status, additional functional criteria and insurance authorization.  Follow Up Recommendations  Acute inpatient rehab (3hours/day)     Assistance Recommended at Discharge Frequent or constant Supervision/Assistance  Equipment Recommendations  BSC/3in1    Recommendations for Other Services       Precautions / Restrictions Precautions Precautions: Fall;Shoulder Type of Shoulder Precautions: Conservative protocal: no shoulder PROM/AROM. Cleared for ROM of hand, wrist, and elbow. Shoulder Interventions: Shoulder sling/immobilizer;At all times;Off for dressing/bathing/exercises Precaution Booklet Issued: Yes (comment) Precaution Comments: Reviewed shoulder precautions Required Braces or Orthoses: Sling;Other Brace Other Brace: post op shoe RLE, WBAT Restrictions Weight Bearing Restrictions: Yes LUE Weight Bearing: Non weight bearing RLE Weight Bearing: Weight bearing as tolerated (transfers only) LLE Weight Bearing: Non weight bearing Other  Position/Activity Restrictions: No restrictions knee ROM on left     Mobility  Bed Mobility Overal bed mobility: Needs Assistance Bed Mobility: Sit to Supine       Sit to supine: Min assist   General bed mobility comments: Assist for LEs, positioning higher in bed. Pt with use of boost function to scoot self up    Transfers Overall transfer level: Needs assistance Equipment used: None Transfers: Bed to chair/wheelchair/BSC            Lateral/Scoot Transfers: Min assist General transfer comment: assist for scoot pivot from drop arm recliner to bed towards R with cues for hip positioning, safe hand placement, NWB LLE.    Ambulation/Gait                   Stairs             Wheelchair Mobility    Modified Rankin (Stroke Patients Only)       Balance Overall balance assessment: Needs assistance Sitting-balance support: Feet supported;No upper extremity supported Sitting balance-Leahy Scale: Good                                      Cognition Arousal/Alertness: Awake/alert Behavior During Therapy: WFL for tasks assessed/performed Overall Cognitive Status: Within Functional Limits for tasks assessed                                 General Comments: requires repeated cues        Exercises General Exercises - Lower Extremity Quad Sets: AROM;Both;10 reps;Supine Gluteal Sets: AROM;Both;10 reps;Supine Heel Slides: AROM;Both;10 reps;Supine Hip ABduction/ADduction: AROM;Both;10 reps;Supine    General Comments  Pertinent Vitals/Pain Pain Assessment: Faces Faces Pain Scale: Hurts even more Pain Location: left shoulder, right foot, LLE Pain Descriptors / Indicators: Operative site guarding;Sore;Aching;Cramping;Grimacing;Guarding Pain Intervention(s): Limited activity within patient's tolerance;Monitored during session;Repositioned    Home Living                          Prior Function             PT Goals (current goals can now be found in the care plan section) Acute Rehab PT Goals PT Goal Formulation: With patient Time For Goal Achievement: 08/07/21 Potential to Achieve Goals: Good Progress towards PT goals: Progressing toward goals    Frequency    Min 4X/week      PT Plan Current plan remains appropriate    Co-evaluation              AM-PAC PT "6 Clicks" Mobility   Outcome Measure  Help needed turning from your back to your side while in a flat bed without using bedrails?: A Little Help needed moving from lying on your back to sitting on the side of a flat bed without using bedrails?: A Little Help needed moving to and from a bed to a chair (including a wheelchair)?: A Lot (mod cues) Help needed standing up from a chair using your arms (e.g., wheelchair or bedside chair)?: A Lot Help needed to walk in hospital room?: Total Help needed climbing 3-5 steps with a railing? : Total 6 Click Score: 12    End of Session   Activity Tolerance: Patient tolerated treatment well Patient left: with call bell/phone within reach;in bed;with bed alarm set Nurse Communication: Mobility status PT Visit Diagnosis: History of falling (Z91.81);Muscle weakness (generalized) (M62.81);Pain Pain - Right/Left: Left Pain - part of body: Ankle and joints of foot;Leg     Time: 1324-4010 PT Time Calculation (min) (ACUTE ONLY): 27 min  Charges:  $Therapeutic Exercise: 8-22 mins $Therapeutic Activity: 8-22 mins                     Kathryn Boyer, PT DPT Acute Rehabilitation Services Pager [phone]  Office [phone]    Kathryn Boyer 07/26/2021, 5:06 PM

## 2021-07-26 NOTE — Progress Notes (Signed)
   ORTHOPAEDIC PROGRESS NOTE  s/p Procedure(s): LEFT REVERSE SHOULDER ARTHROPLASTY for proximal humerus fracture on 07/25/2021 with Dr. Everardo Pacific  ORIF left ankle fracture dislocation on 07/23/2021 with Dr. Carola Frost  SUBJECTIVE: Patient is having more pain in her left shoulder today. Nerve block starting to wear off. Had a dosage of Dilaudid about an hour ago which hasn't helped much. She feels like she can't get comfortable. Is afraid to move due to the pain. Wants to keep pure wick today is afraid she won't be able to get to the bathroom quick enough and has a history of incontinence. She is nervous about therapy today if the pain is so bad.   OBJECTIVE: PE: General: sitting up in hospital bed, NAD LUE: Dressing CDI and sling well fitting,  full and painless ROM throughout hand with DPC of 0.  Axillary nerve sensation/motor altered in setting of block and unable to be fully tested.  + Motor in  AIN, PIN, Ulnar distributions. Well perfused digits.  LLE: splint CDI, +EHL though remainder of motor difficult to test due to splint, sensation intact distally with warm well perfused foot, no pain w passive stretch. Active ROM of left knee from 0 to 90 degrees RLE: post op shoe in place. Continues to have swelling right foot and ankle. intact EHL/TA/GSC. Endorses distal sensation. Warm well perfused foot    Vitals:   07/25/21 2100 07/26/21 0434  BP: 120/64 108/62  Pulse: 92 78  Resp: 18 20  Temp: 98 F (36.7 C) 98.3 F (36.8 C)  SpO2: 98% 93%    ASSESSMENT: Kathryn Boyer is a 65 y.o. female doing well postoperatively.  - s/p ORIF left ankle fracture dislocation with Dr. Carola Frost - POD #3  - s/p Left reverse total shoulder arthroplasty for proximal humerus fracture with Dr. Everardo Pacific - POD #1  - non operative management of left tibial plateau fracture  - non operative management of right distal fibula fracture and fifth metatarsal bone  PLAN: Weightbearing:  - LUE: NWB, okay to use left arm  for ADLs at waist level  - LLE: NWB x 8 weeks  - RLE: WBAT in post-op shoe   Insicional and dressing care:   - LUE: reinforce as needed   - LLE: Keep splint clean and dry   Orthopedic device(s):   - LUE: sling at all times except ADLs at waist level and exercises  - LLE:  Splint x 2 weeks then convert to CAM  - RLE: post op shoe  Showering: Hold for now  Will plan to remove pure wick tomorrow.   VTE prophylaxis: Lovenox 40mg  q 24 hrs  Pain control:   1. Scheduled Tylenol 1000 mg every 8 hours  2. Toradol 15 mg every 6 hours x 4 doses -> scheduled Naproxen BID  3. Gabapentin 100 mg TID  4. Robaxin 750 mg every 8 hours PRN  5. PRN Oxycodone (moderate-severe pain)  6. PRN Dilaudid (severe or breakthrough pain only)  ABLA: last Hgb 11.4. Hemodynamically stable.   Follow - up plan: 1 week in office   Dispo: TBD. New PT/OT evals pending. Previously recommending CIR. Patient does not want to go to SNF.   Contact information:  Dr. , Ramond Marrow, After hours and holidays please check [email or web address] for group call information for [facility]   Alfonse Alpers, PA-C 07/26/2021

## 2021-07-26 NOTE — Progress Notes (Signed)
Occupational Therapy Treatment Patient Details Name: Kathryn Boyer MRN: [id number] DOB: 06-28-1956 Today's Date: 07/26/2021   History of present illness 65 y/o female who presents 11/25 to [facility] with multiple fxs s/p fall - left humerus fx, right foot/lateral malleolus fx and left tibial plateau fx. Transfer to [facility] for surgical intervention. s/p ORIF LLE 11/28. S/p left shoulder replacement on 11/30. PMH includes osteoporosis, HTN, anxiety.   OT comments  Pt currently requiring Min A for lateral scoot to drop arm recliner; cues and assistance for maintaining WB status at LLE. Providing pt with handout and education on shoulder precautions and ROM exercises for L hand, wrist, and elbow. Pt performing AROM x10. Pt very motivated. Recommend dc to CIR for intensive OT and will continue to follow acutely as admitted.   Recommendations for follow up therapy are one component of a multi-disciplinary discharge planning process, led by the attending physician.  Recommendations may be updated based on patient status, additional functional criteria and insurance authorization.    Follow Up Recommendations  Acute inpatient rehab (3hours/day)    Assistance Recommended at Discharge Frequent or constant Supervision/Assistance  Equipment Recommendations  BSC/3in1    Recommendations for Other Services Rehab consult    Precautions / Restrictions Precautions Precautions: Fall;Shoulder Type of Shoulder Precautions: Conservative protocal: no shoulder PROM/AROM. Cleared for ROM of hand, wrist, and elbow. Shoulder Interventions: Shoulder sling/immobilizer;At all times;Off for dressing/bathing/exercises Precaution Booklet Issued: Yes (comment) Precaution Comments: Reviewed shoulder precautions Required Braces or Orthoses: Sling;Other Brace Other Brace: post op shoe RLE, WBAT Restrictions Weight Bearing Restrictions: Yes LUE Weight Bearing: Non weight bearing RLE Weight Bearing: Weight bearing as tolerated LLE  Weight Bearing: Non weight bearing Other Position/Activity Restrictions: No restrictions knee ROM on left       Mobility Bed Mobility Overal bed mobility: Needs Assistance Bed Mobility: Sit to Supine     Supine to sit: HOB elevated;Min assist Sit to supine: Min assist   General bed mobility comments: Min A to support L shoulder and manage LEs back into bed    Transfers Overall transfer level: Needs assistance Equipment used: None Transfers: Bed to chair/wheelchair/BSC            Lateral/Scoot Transfers: Min assist       Balance Overall balance assessment: Needs assistance Sitting-balance support: Feet supported;No upper extremity supported Sitting balance-Leahy Scale: Good                                     ADL either performed or assessed with clinical judgement   ADL Overall ADL's : Needs assistance/impaired                 Upper Body Dressing : Moderate assistance;Sitting Upper Body Dressing Details (indicate cue type and reason): readjusting sling     Toilet Transfer: Minimal assistance;Transfer board (drop arm recliner) Toilet Transfer Details (indicate cue type and reason): Min A for managing LLE during scoot to drop arm recliner         Functional mobility during ADLs: Minimal assistance (lateral scoot) General ADL Comments: Providing education on shoulder precautions and exercises for hand, wrist, and elbow.    Extremity/Trunk Assessment Upper Extremity Assessment Upper Extremity Assessment: LUE deficits/detail LUE Deficits / Details: No shoulder ROM. Cleared hand, wrist, and elbow LUE Coordination: decreased gross motor;decreased fine motor   Lower Extremity Assessment Lower Extremity Assessment: Defer to PT evaluation RLE Deficits / Details: swelling and  discoloration around ankle, limited ankle AROM. RLE Sensation: WNL RLE Coordination: WNL LLE Deficits / Details: ABle to wiggle toes but they are numb, good AROM at  knee/hip LLE Sensation: decreased light touch (toes)        Vision   Vision Assessment?: No apparent visual deficits   Perception     Praxis      Cognition Arousal/Alertness: Awake/alert Behavior During Therapy: WFL for tasks assessed/performed Overall Cognitive Status: Within Functional Limits for tasks assessed                                 General Comments: Noting pt repeating certain topics - unsure if medication or ST memory deficits          Exercises Exercises: Shoulder General Exercises - Lower Extremity Ankle Circles/Pumps: AROM;Right;10 reps;Supine Quad Sets: AROM;Both;10 reps;Supine Heel Slides: AROM;Left;10 reps;Seated Shoulder Exercises Elbow Flexion: AROM;Left;10 reps;Supine Elbow Extension: AROM;Left;10 reps;Supine Wrist Flexion: AROM;Left;10 reps;Supine Wrist Extension: AROM;Left;10 reps;Supine Digit Composite Flexion: AROM;Left;10 reps;Supine Composite Extension: AROM;Left;10 reps;Supine   Shoulder Instructions       General Comments      Pertinent Vitals/ Pain       Pain Assessment: Faces Faces Pain Scale: Hurts whole lot Pain Location: left shoulder, right foot, LLE Pain Descriptors / Indicators: Operative site guarding;Sore;Aching;Cramping;Grimacing;Guarding Pain Intervention(s): Monitored during session;Limited activity within patient's tolerance;Repositioned  Home Living                                          Prior Functioning/Environment              Frequency  Min 3X/week        Progress Toward Goals  OT Goals(current goals can now be found in the care plan section)  Progress towards OT goals: Progressing toward goals  Acute Rehab OT Goals OT Goal Formulation: With patient Time For Goal Achievement: 08/07/21 Potential to Achieve Goals: Good ADL Goals Pt Will Perform Grooming: with set-up;sitting Pt Will Perform Upper Body Dressing: with set-up;sitting Pt Will Perform Lower Body  Dressing: with min assist;with adaptive equipment Pt Will Transfer to Toilet: with min guard assist;bedside commode Pt Will Perform Toileting - Clothing Manipulation and hygiene: with min guard assist;sitting/lateral leans  Plan Discharge plan remains appropriate    Co-evaluation                 AM-PAC OT "6 Clicks" Daily Activity     Outcome Measure   Help from another person eating meals?: A Little Help from another person taking care of personal grooming?: A Little Help from another person toileting, which includes using toliet, bedpan, or urinal?: A Lot Help from another person bathing (including washing, rinsing, drying)?: A Lot Help from another person to put on and taking off regular upper body clothing?: A Lot Help from another person to put on and taking off regular lower body clothing?: A Lot 6 Click Score: 14    End of Session Equipment Utilized During Treatment: Gait belt  OT Visit Diagnosis: Unsteadiness on feet (R26.81);Other abnormalities of gait and mobility (R26.89);Muscle weakness (generalized) (M62.81);Pain   Activity Tolerance Patient limited by pain   Patient Left in bed;with call bell/phone within reach;with bed alarm set   Nurse Communication Mobility status        Time: 5643-3295 OT Time Calculation (min): 31  min  Charges: OT General Charges $OT Visit: 1 Visit OT Treatments $Self Care/Home Management : 23-37 mins  Aryav Wimberly MSOT, OTR/L Acute Rehab Pager: [phone] Office: [phone]  Theodoro Grist Puneet Masoner 07/26/2021, 1:08 PM

## 2021-07-27 ENCOUNTER — Other Ambulatory Visit: Payer: Self-pay

## 2021-07-27 ENCOUNTER — Encounter (HOSPITAL_COMMUNITY): Payer: Self-pay | Admitting: Physical Medicine and Rehabilitation

## 2021-07-27 ENCOUNTER — Inpatient Hospital Stay (HOSPITAL_COMMUNITY)
Admission: RE | Admit: 2021-07-27 | Discharge: 2021-08-02 | DRG: 560 | Disposition: A | Discharge status: Home / Self Care | Payer: BC Managed Care – PPO | Source: Intra-hospital | Attending: Physical Medicine and Rehabilitation | Admitting: Physical Medicine and Rehabilitation

## 2021-07-27 DIAGNOSIS — R339 Retention of urine, unspecified: Secondary | ICD-10-CM | POA: Diagnosis not present

## 2021-07-27 DIAGNOSIS — Z91041 Radiographic dye allergy status: Secondary | ICD-10-CM | POA: Diagnosis not present

## 2021-07-27 DIAGNOSIS — Z96612 Presence of left artificial shoulder joint: Secondary | ICD-10-CM | POA: Diagnosis not present

## 2021-07-27 DIAGNOSIS — S82142A Displaced bicondylar fracture of left tibia, initial encounter for closed fracture: Secondary | ICD-10-CM | POA: Diagnosis not present

## 2021-07-27 DIAGNOSIS — I10 Essential (primary) hypertension: Secondary | ICD-10-CM | POA: Diagnosis not present

## 2021-07-27 DIAGNOSIS — Z880 Allergy status to penicillin: Secondary | ICD-10-CM

## 2021-07-27 DIAGNOSIS — S82142S Displaced bicondylar fracture of left tibia, sequela: Secondary | ICD-10-CM

## 2021-07-27 DIAGNOSIS — Z7401 Bed confinement status: Secondary | ICD-10-CM | POA: Diagnosis not present

## 2021-07-27 DIAGNOSIS — Z90710 Acquired absence of both cervix and uterus: Secondary | ICD-10-CM | POA: Diagnosis not present

## 2021-07-27 DIAGNOSIS — Z471 Aftercare following joint replacement surgery: Secondary | ICD-10-CM | POA: Diagnosis not present

## 2021-07-27 DIAGNOSIS — F419 Anxiety disorder, unspecified: Secondary | ICD-10-CM | POA: Diagnosis present

## 2021-07-27 DIAGNOSIS — S92351D Displaced fracture of fifth metatarsal bone, right foot, subsequent encounter for fracture with routine healing: Secondary | ICD-10-CM | POA: Diagnosis not present

## 2021-07-27 DIAGNOSIS — Z96642 Presence of left artificial hip joint: Secondary | ICD-10-CM | POA: Diagnosis not present

## 2021-07-27 DIAGNOSIS — M810 Age-related osteoporosis without current pathological fracture: Secondary | ICD-10-CM | POA: Diagnosis not present

## 2021-07-27 DIAGNOSIS — K5903 Drug induced constipation: Secondary | ICD-10-CM

## 2021-07-27 DIAGNOSIS — Z8679 Personal history of other diseases of the circulatory system: Secondary | ICD-10-CM | POA: Diagnosis not present

## 2021-07-27 DIAGNOSIS — G8918 Other acute postprocedural pain: Secondary | ICD-10-CM | POA: Diagnosis not present

## 2021-07-27 DIAGNOSIS — S82852D Displaced trimalleolar fracture of left lower leg, subsequent encounter for closed fracture with routine healing: Principal | ICD-10-CM

## 2021-07-27 DIAGNOSIS — X501XXD Overexertion from prolonged static or awkward postures, subsequent encounter: Secondary | ICD-10-CM

## 2021-07-27 DIAGNOSIS — F411 Generalized anxiety disorder: Secondary | ICD-10-CM | POA: Diagnosis present

## 2021-07-27 DIAGNOSIS — Z888 Allergy status to other drugs, medicaments and biological substances status: Secondary | ICD-10-CM | POA: Diagnosis not present

## 2021-07-27 DIAGNOSIS — S8264XD Nondisplaced fracture of lateral malleolus of right fibula, subsequent encounter for closed fracture with routine healing: Secondary | ICD-10-CM | POA: Diagnosis not present

## 2021-07-27 DIAGNOSIS — Z8249 Family history of ischemic heart disease and other diseases of the circulatory system: Secondary | ICD-10-CM | POA: Diagnosis not present

## 2021-07-27 DIAGNOSIS — E039 Hypothyroidism, unspecified: Secondary | ICD-10-CM | POA: Diagnosis not present

## 2021-07-27 DIAGNOSIS — D62 Acute posthemorrhagic anemia: Secondary | ICD-10-CM | POA: Diagnosis not present

## 2021-07-27 DIAGNOSIS — R29898 Other symptoms and signs involving the musculoskeletal system: Secondary | ICD-10-CM | POA: Diagnosis not present

## 2021-07-27 DIAGNOSIS — Z881 Allergy status to other antibiotic agents status: Secondary | ICD-10-CM | POA: Diagnosis not present

## 2021-07-27 DIAGNOSIS — M62838 Other muscle spasm: Secondary | ICD-10-CM | POA: Diagnosis not present

## 2021-07-27 DIAGNOSIS — T07XXXA Unspecified multiple injuries, initial encounter: Secondary | ICD-10-CM | POA: Diagnosis present

## 2021-07-27 DIAGNOSIS — S82142D Displaced bicondylar fracture of left tibia, subsequent encounter for closed fracture with routine healing: Secondary | ICD-10-CM | POA: Diagnosis not present

## 2021-07-27 DIAGNOSIS — S42292A Other displaced fracture of upper end of left humerus, initial encounter for closed fracture: Secondary | ICD-10-CM | POA: Diagnosis not present

## 2021-07-27 DIAGNOSIS — S42309A Unspecified fracture of shaft of humerus, unspecified arm, initial encounter for closed fracture: Secondary | ICD-10-CM | POA: Diagnosis not present

## 2021-07-27 DIAGNOSIS — E785 Hyperlipidemia, unspecified: Secondary | ICD-10-CM | POA: Diagnosis present

## 2021-07-27 DIAGNOSIS — Z7951 Long term (current) use of inhaled steroids: Secondary | ICD-10-CM

## 2021-07-27 DIAGNOSIS — S42202S Unspecified fracture of upper end of left humerus, sequela: Secondary | ICD-10-CM

## 2021-07-27 DIAGNOSIS — R5381 Other malaise: Secondary | ICD-10-CM | POA: Diagnosis not present

## 2021-07-27 DIAGNOSIS — Z79899 Other long term (current) drug therapy: Secondary | ICD-10-CM

## 2021-07-27 DIAGNOSIS — Z79890 Hormone replacement therapy (postmenopausal): Secondary | ICD-10-CM | POA: Diagnosis not present

## 2021-07-27 DIAGNOSIS — Z09 Encounter for follow-up examination after completed treatment for conditions other than malignant neoplasm: Secondary | ICD-10-CM

## 2021-07-27 HISTORY — DX: Unspecified multiple injuries, initial encounter: T07.XXXA

## 2021-07-27 LAB — CREATININE, SERUM
Creatinine, Ser: 0.92 mg/dL (ref 0.44–1.00)
GFR, Estimated: >60 mL/min (ref >60)

## 2021-07-27 MED ORDER — RIVAROXABAN 10 MG PO TABS: 10.0000 mg | ORAL_TABLET | Freq: Every day | ORAL | Status: DC

## 2021-07-27 MED ORDER — CALCIUM CITRATE 950 (200 CA) MG PO TABS
200.0000 mg | ORAL_TABLET | Freq: Two times a day (BID) | ORAL | Status: DC
  Administered 2021-07-27: 200 mg via ORAL
  Filled 2021-07-27 (×3): qty 1

## 2021-07-27 MED ORDER — POLYETHYLENE GLYCOL 3350 17 G PO PACK
17.0000 g | PACK | Freq: Every day | ORAL | Status: DC
Start: 2021-07-27 — End: 2021-07-28
  Administered 2021-07-27 – 2021-07-28 (×2): 17 g via ORAL
  Filled 2021-07-27 (×2): qty 1

## 2021-07-27 MED ORDER — ONDANSETRON HCL 4 MG/2ML IJ SOLN: 4.0000 mg | Freq: Four times a day (QID) | INTRAMUSCULAR | Status: DC | PRN

## 2021-07-27 MED ORDER — ONDANSETRON HCL 4 MG PO TABS: 4.0000 mg | ORAL_TABLET | Freq: Four times a day (QID) | ORAL | Status: DC | PRN

## 2021-07-27 MED ORDER — SENNOSIDES-DOCUSATE SODIUM 8.6-50 MG PO TABS
2.0000 | ORAL_TABLET | Freq: Every day | ORAL | Status: DC
  Administered 2021-07-27 – 2021-07-29 (×3): 2 via ORAL
  Filled 2021-07-27 (×3): qty 2

## 2021-07-27 MED ORDER — TRAZODONE HCL 50 MG PO TABS
25.0000 mg | ORAL_TABLET | Freq: Every evening | ORAL | Status: DC | PRN
  Administered 2021-08-01: 50 mg via ORAL
  Filled 2021-07-27 (×2): qty 1

## 2021-07-27 MED ORDER — PANTOPRAZOLE SODIUM 40 MG PO TBEC
40.0000 mg | DELAYED_RELEASE_TABLET | Freq: Every day | ORAL | Status: DC
  Administered 2021-07-28 – 2021-08-02 (×6): 40 mg via ORAL
  Filled 2021-07-27 (×6): qty 1

## 2021-07-27 MED ORDER — RIVAROXABAN 10 MG PO TABS
10.0000 mg | ORAL_TABLET | Freq: Every day | ORAL | Status: DC
  Administered 2021-07-28 – 2021-08-02 (×6): 10 mg via ORAL
  Filled 2021-07-27 (×6): qty 1

## 2021-07-27 MED ORDER — PHENOL 1.4 % MT LIQD
1.0000 | OROMUCOSAL | Status: DC | PRN
  Filled 2021-07-27: qty 177

## 2021-07-27 MED ORDER — PROSOURCE PLUS PO LIQD
30.0000 mL | Freq: Two times a day (BID) | ORAL | Status: DC
  Administered 2021-07-28 – 2021-08-02 (×11): 30 mL via ORAL
  Filled 2021-07-27 (×12): qty 30

## 2021-07-27 MED ORDER — VITAMIN D 25 MCG (1000 UNIT) PO TABS
2000.0000 [IU] | ORAL_TABLET | Freq: Two times a day (BID) | ORAL | Status: DC
  Administered 2021-07-27 – 2021-08-02 (×12): 2000 [IU] via ORAL
  Filled 2021-07-27 (×12): qty 2

## 2021-07-27 MED ORDER — GABAPENTIN 100 MG PO CAPS
100.0000 mg | ORAL_CAPSULE | Freq: Three times a day (TID) | ORAL | Status: DC
  Administered 2021-07-27 – 2021-08-02 (×17): 100 mg via ORAL
  Filled 2021-07-27 (×17): qty 1

## 2021-07-27 MED ORDER — FLEET ENEMA 7-19 GM/118ML RE ENEM
1.0000 | ENEMA | Freq: Once | RECTAL | Status: AC | PRN
  Administered 2021-07-29: 05:00:00 1 via RECTAL
  Filled 2021-07-27 (×2): qty 1

## 2021-07-27 MED ORDER — ALUM & MAG HYDROXIDE-SIMETH 200-200-20 MG/5ML PO SUSP: 30.0000 mL | ORAL | Status: DC | PRN

## 2021-07-27 MED ORDER — ALPRAZOLAM 0.5 MG PO TABS: 0.5000 mg | ORAL_TABLET | Freq: Three times a day (TID) | ORAL | Status: DC | PRN

## 2021-07-27 MED ORDER — OXYCODONE HCL 5 MG PO TABS
5.0000 mg | ORAL_TABLET | ORAL | Status: DC | PRN
Start: 2021-07-27 — End: 2021-08-02
  Administered 2021-07-28 (×2): 10 mg via ORAL
  Administered 2021-07-29: 17:00:00 5 mg via ORAL
  Administered 2021-07-29 (×2): 10 mg via ORAL
  Administered 2021-07-30: 5 mg via ORAL
  Administered 2021-08-02 (×2): 10 mg via ORAL
  Filled 2021-07-27: qty 1
  Filled 2021-07-27 (×4): qty 2
  Filled 2021-07-27 (×2): qty 1
  Filled 2021-07-27 (×4): qty 2

## 2021-07-27 MED ORDER — NAPROXEN 250 MG PO TABS
250.0000 mg | ORAL_TABLET | Freq: Two times a day (BID) | ORAL | Status: DC
  Administered 2021-07-28 – 2021-08-02 (×11): 250 mg via ORAL
  Filled 2021-07-27 (×12): qty 1

## 2021-07-27 MED ORDER — OXYCODONE HCL 5 MG PO TABS
10.0000 mg | ORAL_TABLET | ORAL | Status: DC | PRN
  Administered 2021-07-27: 10 mg via ORAL
  Administered 2021-07-28: 15 mg via ORAL
  Administered 2021-07-30: 10 mg via ORAL
  Administered 2021-07-30 – 2021-07-31 (×3): 15 mg via ORAL
  Administered 2021-08-01: 10 mg via ORAL
  Administered 2021-08-01: 15 mg via ORAL
  Filled 2021-07-27 (×5): qty 3
  Filled 2021-07-27: qty 2

## 2021-07-27 MED ORDER — ASCORBIC ACID 500 MG PO TABS
500.0000 mg | ORAL_TABLET | Freq: Every day | ORAL | Status: DC
  Administered 2021-07-28 – 2021-08-02 (×6): 500 mg via ORAL
  Filled 2021-07-27 (×6): qty 1

## 2021-07-27 MED ORDER — ACETAMINOPHEN 325 MG PO TABS
325.0000 mg | ORAL_TABLET | Freq: Four times a day (QID) | ORAL | Status: DC | PRN
  Administered 2021-07-27: 650 mg via ORAL
  Filled 2021-07-27: qty 2

## 2021-07-27 MED ORDER — METHOCARBAMOL 750 MG PO TABS
750.0000 mg | ORAL_TABLET | Freq: Three times a day (TID) | ORAL | Status: DC
  Administered 2021-07-27 – 2021-08-02 (×17): 750 mg via ORAL
  Filled 2021-07-27 (×17): qty 1

## 2021-07-27 MED ORDER — DIPHENHYDRAMINE HCL 12.5 MG/5ML PO ELIX
12.5000 mg | ORAL_SOLUTION | ORAL | Status: DC | PRN
Start: 2021-07-27 — End: 2021-08-02
  Administered 2021-07-27: 25 mg via ORAL
  Filled 2021-07-27: qty 10

## 2021-07-27 MED ORDER — MENTHOL 3 MG MT LOZG: 1.0000 | LOZENGE | OROMUCOSAL | Status: DC | PRN

## 2021-07-27 MED ORDER — GUAIFENESIN-DM 100-10 MG/5ML PO SYRP: 5.0000 mL | ORAL_SOLUTION | Freq: Four times a day (QID) | ORAL | Status: DC | PRN

## 2021-07-27 MED ORDER — LEVOTHYROXINE SODIUM 100 MCG PO TABS
100.0000 ug | ORAL_TABLET | Freq: Every day | ORAL | Status: DC
  Administered 2021-07-28 – 2021-08-02 (×6): 100 ug via ORAL
  Filled 2021-07-27 (×6): qty 1

## 2021-07-27 MED ORDER — SORBITOL 70 % SOLN: 30.0000 mL | Freq: Every day | Status: DC | PRN

## 2021-07-27 MED ORDER — PRAVASTATIN SODIUM 10 MG PO TABS
20.0000 mg | ORAL_TABLET | Freq: Every day | ORAL | Status: DC
  Administered 2021-07-28 – 2021-08-02 (×6): 20 mg via ORAL
  Filled 2021-07-27 (×6): qty 2

## 2021-07-27 NOTE — Plan of Care (Signed)
  Problem: Education: Goal: Knowledge of General Education information will improve Description: Including pain rating scale, medication(s)/side effects and non-pharmacologic comfort measures Outcome: Progressing   Problem: Nutrition: Goal: Adequate nutrition will be maintained Outcome: Progressing   Problem: Safety: Goal: Ability to remain free from injury will improve Outcome: Progressing   

## 2021-07-27 NOTE — Progress Notes (Signed)
   ORTHOPAEDIC PROGRESS NOTE  s/p Procedure(s): LEFT REVERSE SHOULDER ARTHROPLASTY for proximal humerus fracture on 07/25/2021 with Dr. Everardo Pacific  ORIF left ankle fracture dislocation on 07/23/2021 with Dr. Carola Frost  SUBJECTIVE: Patient's pain is better controlled today. Is motivated to work with therapy. She is apprehensive about removing pure wick due to incontinence. She is hopeful for CIR. Asking about a different post-op shoe for her right foot.   OBJECTIVE: PE: General: sitting up in hospital bed, NAD LUE: Dressing CDI and sling well fitting,  full and painless ROM throughout hand with DPC of 0. + Motor in  AIN, PIN, Ulnar distributions. Axillary nerve sensation preserved and symmetric.  Sensation intact in medial, radial, and ulnar distributions. Well perfused digits.  LLE: splint CDI, +EHL though remainder of motor difficult to test due to splint, sensation intact distally with warm well perfused foot, no pain w passive stretch. Active ROM of left knee from 0 to 90 degrees RLE: post op shoe in place. Continues to have swelling right foot and ankle. intact EHL/TA/GSC. Endorses distal sensation. Warm well perfused foot   Vitals:   07/26/21 2020 07/27/21 0421  BP: 100/64 113/66  Pulse: 75 80  Resp: 20 20  Temp: 98.6 F (37 C) 98.2 F (36.8 C)  SpO2: 95% 93%    ASSESSMENT: Kathryn Boyer is a 65 y.o. female doing well postoperatively.  - s/p ORIF left ankle fracture dislocation with Dr. Carola Frost - POD #4  - s/p Left reverse total shoulder arthroplasty for proximal humerus fracture with Dr. Everardo Pacific - POD #2  - non operative management of left tibial plateau fracture  - non operative management of right distal fibula fracture and fifth metatarsal bone  PLAN: Weightbearing:  - LUE: NWB, okay to use left arm for ADLs at waist level  - LLE: NWB x 8 weeks  - RLE: WBAT in post-op shoe   Insicional and dressing care:   - LUE: reinforce as needed   - LLE: Keep splint clean and dry    Orthopedic device(s):   - LUE: sling at all times except ADLs at waist level and exercises  - LLE:  Splint x 2 weeks then convert to CAM  - RLE: post op shoe  VTE prophylaxis: Lovenox 40mg  q 24 hrs  Pain control:   1. Scheduled Tylenol 1000 mg every 8 hours  2. Toradol 15 mg every 6 hours x 4 doses -> scheduled Naproxen BID  3. Gabapentin 100 mg TID  4. Robaxin 750 mg every 8 hours PRN  5. PRN Oxycodone (moderate-severe pain)  6. PRN Dilaudid (severe or breakthrough pain only)  ABLA: last Hgb 10.4. Hemodynamically stable.   Follow - up plan: 1 week in office   Dispo: TBD. PT/OT recommending CIR. Inpatient admissions coordinator following. Will need authorization from [facility]. Patient does not want to go to SNF.   Contact information:  Dr. [facility], Ramond Marrow, After hours and holidays please check [email or web address] for group call information for [facility]   Alfonse Alpers, PA-C 07/27/2021

## 2021-07-27 NOTE — Progress Notes (Signed)
Orthopaedic Trauma Service Progress Note  Patient ID: Kathryn Boyer MRN: [id number] DOB/AGE: [birth year] 66 y.o.  Subjective:  Doing better today Motivated to start therapy Hopeful to go to CIR but does not want to stay in hospital waiting any longer than she has to  Pain controlled currently   No other complaints   ROS As above  Objective:   VITALS:   Vitals:   07/26/21 2020 07/26/21 2020 07/27/21 0421 07/27/21 0821  BP: 100/64 100/64 113/66 127/80  Pulse: 75 75 80 77  Resp: 16 20 20 19   Temp: 98.6 F (37 C) 98.6 F (37 C) 98.2 F (36.8 C) 98.7 F (37.1 C)  TempSrc: Oral   Oral  SpO2: 94% 95% 93% 95%  Weight:      Height:        Estimated body mass index is 27.86 kg/m as calculated from the following:   Height as of this encounter: 5\' 7"  (1.702 m).   Weight as of this encounter: 80.7 kg.   Intake/Output      12/01 0701 12/02 0700 12/02 0701 12/03 0700   P.O. 240    I.V. (mL/kg)     IV Piggyback     Total Intake(mL/kg) 240 (3)    Urine (mL/kg/hr)     Blood     Total Output     Net +240           LABS  Results for orders placed or performed during the hospital encounter of 07/20/21 (from the past 24 hour(s))  Creatinine, serum     Status: None   Collection Time: 07/27/21  5:52 AM  Result Value Ref Range   Creatinine, Ser 0.92 0.44 - 1.00 mg/dL   GFR, Estimated 07/22/21 14/02/22 mL/min     PHYSICAL EXAM:  Gen: sitting up in chair, NAD, pleasant, husband at bedside  Lungs: unlabored  Cardiac: reg  Abd: soft, NT Ext:              Left Upper Extremity                          Sling in place             Right Lower Extremity                          Post op shoe                          Mild swelling and ecchymosis to R foot and ankle                          Ext warm                          Motor and sensory functions intact                         + DP pulse                 Left Lower Extremity  SLS clean, dry and intact                         DPN, SPN, TN sensation grossly intact but still diminished                         EHL, FHL, Lesser toe motor intact                         No pain out of proportion with passive stretching                          Swelling controlled                            Assessment/Plan: 2 Days Post-Op   Principal Problem:   Left trimalleolar fracture, closed, initial encounter Active Problems:   Adjustment disorder with anxiety   Generalized anxiety disorder   Hyperlipidemia LDL goal <100   Fall   Humerus fracture   Tibial plateau fracture, left   Nondisplaced fracture of fifth metatarsal bone, right foot, initial encounter for closed fracture   Nondisplaced fracture of lateral malleolus of right fibula, initial encounter for closed fracture   Anti-infectives (From admission, onward)    Start     Dose/Rate Route Frequency Ordered Stop   07/25/21 2000  ceFAZolin (ANCEF) IVPB 1 g/50 mL premix        1 g 100 mL/hr over 30 Minutes Intravenous Every 6 hours 07/25/21 1934 07/26/21 0915   07/25/21 1136  vancomycin (VANCOCIN) powder  Status:  Discontinued          As needed 07/25/21 1136 07/25/21 1200   07/25/21 0830  ceFAZolin (ANCEF) IVPB 2g/100 mL premix        2 g 200 mL/hr over 30 Minutes Intravenous  Once 07/24/21 1213 07/25/21 0900   07/25/21 0600  ceFAZolin (ANCEF) IVPB 2g/100 mL premix  Status:  Discontinued        2 g 200 mL/hr over 30 Minutes Intravenous On call to O.R. 07/24/21 1837 07/25/21 1359   07/23/21 1445  ceFAZolin (ANCEF) IVPB 1 g/50 mL premix        1 g 100 mL/hr over 30 Minutes Intravenous Every 6 hours 07/23/21 1359 07/24/21 0459   07/23/21 0812  ceFAZolin (ANCEF) 2-4 GM/100ML-% IVPB       Note to Pharmacy: Tawanna Sat   : cabinet override      07/23/21 0812 07/23/21 2029     .  POD/HD#: 4 (ORIF L ankle)  65 y/o female s/p ground level fall with multiple  fractures     -ground level fall    - Left ankle fracture dislocation s/p ORIF              NWB L leg x 8 weeks             Splint x 2 weeks then convert to CAM              Ice and elevate             PT/OT             Ok to move toes and knee               - R distal fibula fracture and proximal 5th metatarsal  fracture              Non-op             WBAT for transfers              Ordered new post op shoe from outside vendor that hopefully fits better and has more traction    Ok to have shoe off when not mobilizing              Ice and elevate             Ace or compressive sock for swelling    - comminuted L proximal humerus fracture              Per Dr. Everardo Pacific and [place] Citizen    NWB L UEx    - Pain management:             Multimodal   - ABL anemia/Hemodynamics             Stable             cbc in am  - DVT/PE prophylaxis:             Lovenox              will transition to xarelto daily x 30 days   - ID:              [facility] abx   - Metabolic Bone Disease:             Osteoporosis                         Had lengthy discussion with pt                          Primary goal now is to prevent secondary fractures                         Her fractures by definition are fragility fractures                         She meets criteria for pharmacologic treatment of her osteoporosis                          Multi-disciplinary approach including diet/nutrition/vitamin supplements, resistance training/balance training, pharmacologics                           Due for updated DEXA                         Will need additional labs in the outpt setting to help make an informed decision on which pharmacologic agents wound be best at this time point    - Activity:             As above   - FEN/GI prophylaxis/Foley/Lines:             Reg diet    - Impediments to fracture healing:             Osteoporosis              Thyroid disease   - Dispo:             Ortho issues  addressed  Waiting to hear re auth for CIR   Think if pt mobilizes well over the weekend she could dc home on Monday if no CIR bed available    Mearl Latin, PA-C [phone] (C) 07/27/2021, 11:02 AM  [facility] [address] [place] [address] [phone] [phone] (F)    After 5pm and on the weekends please log on to Amion, go to orthopaedics and the look under the [facility] Call for the provider(s) on call. You can also call our office at [phone] and then follow the prompts to be connected to the call team.   Patient ID: Kathryn Boyer, female   DOB: March 01, 1956, 65 y.o.   MRN: [id number]

## 2021-07-27 NOTE — Evaluation (Signed)
Occupational Therapy Assessment and Plan  Patient Details  Name: Kathryn Boyer MRN: [id number] Date of Birth: 12-Sep-1955  OT Diagnosis: acute pain, muscle weakness (generalized), pain in joint, and swelling of limb Rehab Potential: Rehab Potential (ACUTE ONLY): Good ELOS: 5-7 days   Today's Date: 07/28/2021 OT Individual Time: 1050-1205 OT Individual Time Calculation (min): 75 min     Hospital Problem: Principal Problem:   Critical polytrauma Active Problems:   Acute blood loss anemia   History of hypertension   Drug induced constipation   Postoperative pain   Past Medical History:  Past Medical History:  Diagnosis Date   Anxiety    Asthma    Hyperlipidemia    Hypertension    Plantar fasciitis    Thyroid disease    Past Surgical History:  Past Surgical History:  Procedure Laterality Date   ABDOMINAL HYSTERECTOMY     BREAST SURGERY     NOSE SURGERY     ORIF ANKLE FRACTURE Left 07/23/2021   Procedure: OPEN REDUCTION INTERNAL FIXATION (ORIF) ANKLE FRACTURE;  Surgeon: Altamese Reader, MD;  Location: [place];  Service: Orthopedics;  Laterality: Left;    Assessment & Plan Clinical Impression: Patient is a 65 year old white female who was walking inside of her house on November 25 going down to a landing where she misplaced her foot landing on her left shoulder and twisting her left ankle violently.  She immediately complained of pain in both feet and ankles as well as her left shoulder.  There is no loss of consciousness.  Patient suffered a left depressed medial tibial plateau fracture, a left trimalleolar ankle fracture/dislocation and ruptured syndesmosis, left proximal humerus fracture, right nondisplaced lateral malleolus fracture, right fifth metatarsal base fracture.  Patient initially was seen at [facility] but then given the complexity of the fractures was sent to [facility] for further care.  On 11/28 patient underwent open reduction internal fixation of  left bimalleolar ankle fracture, open reduction internal fixation of syndesmosis, closed treatment of left medial condyle fracture of the tibia by Dr. Marcelino Scot.  It was felt that the left split proximal humerus fracture was unreconstructable. Dr. Griffin Basil was consulted and the patient underwent a left reverse total shoulder arthroplasty on 07/25/2021.  Patient is nonweightbearing left upper extremity, okay to use arm for ADLs at waist level, sling at all times except for ADLs and waist level exercises.  Patient is nonweightbearing for 8 weeks left lower extremity in splint.  Plan will to be convert patient to cam boot in about 2 weeks.  Patient is weightbearing as tolerated right lower extremity in postop shoe.  Patient was placed on Lovenox 40 mg every 24 hours for DVT prophylaxis.  Patient reports not much appetite.  She has not moved her bowels since coming to the hospital.  Medications for pain have been effective for controlling her pain although they do make her a bit sleepy.   Patient currently requires min-max with basic self-care skills secondary to muscle weakness, decreased cardiorespiratoy endurance, and decreased sitting balance, decreased balance strategies, and difficulty maintaining precautions.  Prior to hospitalization, patient could complete BADLs with independent .  Patient will benefit from skilled intervention to increase independence with basic self-care skills prior to discharge home with care partner.  Anticipate patient will require 24 hour supervision, minimal physical assistance, and    follow up home health.  OT - End of Session Endurance Deficit: Yes Endurance Deficit Description: Pt only able to tolerate EOB/OOB activity for <10  minutes during eval OT Assessment Rehab Potential (ACUTE ONLY): Good OT Barriers to Discharge: Inaccessible home environment;Weight bearing restrictions;Home environment access/layout OT Barriers to Discharge Comments: Pt currently trying to figure out  home to access home w/c level OT Patient demonstrates impairments in the following area(s): Balance;Edema;Endurance;Motor;Safety OT Basic ADL's Functional Problem(s): Grooming;Bathing;Dressing;Toileting OT Advanced ADL's Functional Problem(s): Simple Meal Preparation OT Transfers Functional Problem(s): Toilet;Tub/Shower OT Additional Impairment(s): Fuctional Use of Upper Extremity OT Plan OT Intensity: Minimum of 1-2 x/day, 45 to 90 minutes OT Frequency: 5 out of 7 days OT Duration/Estimated Length of Stay: 5-7 days OT Treatment/Interventions: Balance/vestibular training;Disease mangement/prevention;Self Care/advanced ADL retraining;Therapeutic Exercise;Wheelchair propulsion/positioning;DME/adaptive equipment instruction;Pain management;Skin care/wound managment;UE/LE Strength taining/ROM;UE/LE Coordination activities;Splinting/orthotics;Patient/family education;Community reintegration;Discharge planning;Functional mobility training;Psychosocial support;Therapeutic Activities OT Self Feeding Anticipated Outcome(s): No goal OT Basic Self-Care Anticipated Outcome(s): Min A UB dressing, Min A grooming OT Toileting Anticipated Outcome(s): No goal- pt reports she plans to have a home health aide and wants Max A OT Bathroom Transfers Anticipated Outcome(s): CGA (BSC transfer is important to her in regards to functional independence) OT Recommendation Recommendations for Other Services: Other (comment) (n/a) Patient destination: Home Follow Up Recommendations: 24 hour supervision/assistance Equipment Recommended: To be determined (will need drop arm BSC)   OT Evaluation Precautions/Restrictions  Precautions Precautions: Fall;Shoulder Type of Shoulder Precautions: Conservative shoulder protocol: no PROM/AROM. Cleared for ROM of L hand, wrist, and elbow. Shoulder Interventions: At all times;Off for dressing/bathing/exercises;Don joy ultra sling;Shoulder abduction pillow Precaution Comments:  Reviewed shoulder precautions Required Braces or Orthoses: Other Brace;Splint/Cast Splint/Cast: LLE for 2 weeks then CAM boot, NWB for 8 weeks Other Brace: post op shoe RLE, WBAT Restrictions Weight Bearing Restrictions: Yes LUE Weight Bearing: Non weight bearing RLE Weight Bearing: Weight bearing as tolerated LLE Weight Bearing: Non weight bearing Other Position/Activity Restrictions: No restrictions knee ROM on left Pain Pain Assessment Pain Scale: 0-10 Pain Score: 5  Pain Type: Acute pain;Surgical pain Pain Location: Foot Pain Orientation: Left Pain Descriptors / Indicators: Aching;Burning;Throbbing Pain Onset: On-going Patients Stated Pain Goal: 2 Pain Intervention(s): Repositioned 2nd Pain Site Pain Score: 5 Pain Type: Acute pain;Surgical pain Pain Location: Shoulder Pain Orientation: Left Pain Descriptors / Indicators: Aching Pain Frequency: Constant Pain Onset: On-going Patient's Stated Pain Goal: 0 Pain Intervention(s): Repositioned (sling repositioned) Home Living/Prior Functioning Home Living Family/patient expects to be discharged to:: Private residence Living Arrangements: Spouse/significant other Available Help at Discharge: Family Type of Home: House Home Access: Stairs to enter [employer] of Steps: 4, landing then 4 more (pt reports that she will purchase 2 ramps to access home) Entrance Stairs-Rails: None Home Layout: Two level, Able to live on main level with bedroom/bathroom Bathroom Shower/Tub: Tub/shower unit, Multimedia programmer: Handicapped height Bathroom Accessibility: Yes Additional Comments: she "thinks" that it may also be wheelchair accessible however unsure if w/c can fit closely enough to the toilet for slideboard/squat pivot transfers  Lives With: Spouse IADL History Homemaking Responsibilities: Yes (pt independent with ADLs/IADLs PTA) Prior Function Level of Independence: Independent with basic ADLs, Independent  with homemaking with ambulation  Able to Take Stairs?: Reciprically Driving: Yes Leisure: Hobbies-yes (Comment) Vision Baseline Vision/History: 0 No visual deficits Ability to See in Adequate Light: 0 Adequate Patient Visual Report: No change from baseline Vision Assessment?: No apparent visual deficits Perception  Perception: Within Functional Limits Praxis Praxis: Intact Cognition Overall Cognitive Status: Within Functional Limits for tasks assessed Arousal/Alertness: Awake/alert Orientation Level: Person;Place;Situation Person: Oriented Place: Oriented Situation: Oriented Year: 2022 Month: December Day of Week: Correct  Memory: Appears intact Immediate Memory Recall: Sock;Blue;Bed Memory Recall Sock: Without Cue Memory Recall Blue: Without Cue Memory Recall Bed: Without Cue Attention: Focused Awareness: Appears intact Problem Solving: Appears intact Safety/Judgment: Appears intact Sensation Sensation Light Touch: Impaired by gross assessment (light touch to toes on LLE diminished but present) Coordination Gross Motor Movements are Fluid and Coordinated: No Fine Motor Movements are Fluid and Coordinated: Yes Coordination and Movement Description: Affected by swelling and multiple movement restrictions due to fractures/medical precautions Finger Nose Finger Test: WNL Rt, unable on the Lt due to precautions Heel Shin Test: [facility] with exception for pain in dependent positioning Motor  Motor Motor: Other (comment) Motor - Skilled Clinical Observations: restricted due to WB precautions, swelling, and pain during functional activity/movement  Trunk/Postural Assessment  Cervical Assessment Cervical Assessment: Exceptions to [facility] (forward head) Thoracic Assessment Thoracic Assessment: Exceptions to [facility] (mild rounded shoulders) Lumbar Assessment Lumbar Assessment: Within Functional Limits Postural Control Postural Control: Within Functional Limits  Balance Balance Balance  Assessed: Yes Dynamic Sitting Balance Dynamic Sitting - Balance Support: During functional activity Dynamic Sitting - Level of Assistance: 4: Min assist (slideboard<BSC) Dynamic Sitting - Balance Activities: Lateral lean/weight shifting;Forward lean/weight shifting Extremity/Trunk Assessment RUE Assessment RUE Assessment: Within Functional Limits Active Range of Motion (AROM) Comments: WNL LUE Assessment LUE Assessment: Exceptions to WFL (+edema, NWB shoulder, ROM of elbow, wrist, hand ok per MD order)  Care Tool Care Tool Self Care Eating   Eating Assist Level:  (not assessed)    Oral Care     Not assessed    Bathing Bathing activity did not occur:  (not assessed due to time constraints)            Upper Body Dressing(including orthotics)   What is the patient wearing?: Pull over shirt   Assist Level: Maximal Assistance - Patient 25 - 49%    Lower Body Dressing (excluding footwear)   What is the patient wearing?: Pants Assist for lower body dressing: Maximal Assistance - Patient 25 - 49%    Putting on/Taking off footwear   What is the patient wearing?: Shoes Assist for footwear: Maximal Assistance - Patient 25 - 49%       Care Tool Toileting Toileting activity Toileting Activity did not occur (Clothing management and hygiene only): N/A (no void or bm)         Toilet transfer   Assist Level: Minimal Assistance - Patient > 75%     Care Tool Cognition  Expression of Ideas and Wants Expression of Ideas and Wants: 4. Without difficulty (complex and basic) - expresses complex messages without difficulty and with speech that is clear and easy to understand  Understanding Verbal and Non-Verbal Content Understanding Verbal and Non-Verbal Content: 4. Understands (complex and basic) - clear comprehension without cues or repetitions       Refer to Care Plan for [place] 1 OT Short Term Goal 1 (Week 1): STGs=LTGs due to  ELOS  Recommendations for other services: None    Skilled Therapeutic Intervention Skilled OT session completed with focus on initial evaluation, education on OT role/POC, and establishment of patient-centered goals.   Pt greeted in bed, using purewick due to incontinence and frequent urination. Spent a great deal of time at start of session d/c planning, pt planning to sleep in her recliner at home, purchase 2 ramps to access home, and use the [employer] for toileting needs. OT demonstrated squat pivot and also slideboard transfers. Max A  to don pants bedlevel, had pt wear her post op shoe for safety while WB Rt foot during bed mobility. She was able to transition to EOB herself using hospital bed features. Pt directing care for doffing sling/UB dressing after OT advised her to dress the Lt UE first. Min A for slideboard<drop arm BSC (OT first had to retrieve board). Assistance needed to maintain NWB of the Lt LE. Pt quickly reported that she was uncomfortable and wanted to return to bed. Did so with Min A. +2 assist for assisting pt with repositioning in bed and boosting up, pt often reporting that she was uncomfortable and needing minor adjustments for positioning. She washed face and periarea, needed A to wash Rt underarm. Then she asked for NT to reapply her purewick. Pt remained in bed at close of session, all needs within reach and bed alarm set.   ADL ADL Grooming: Maximal assistance Where Assessed-Grooming: Bed level Upper Body Bathing: Not assessed Lower Body Bathing: Not assessed Upper Body Dressing: Maximal assistance Where Assessed-Upper Body Dressing: Edge of bed Lower Body Dressing: Maximal assistance Where Assessed-Lower Body Dressing: Bed level Toileting: Not assessed Where Assessed-Toileting: Bedside Commode Toilet Transfer: Minimal assistance Toilet Transfer Method: Environmental consultant Equipment: Drop arm bedside commode Tub/Shower Transfer: Not assessed Mobility  Bed  Mobility Bed Mobility: Rolling Right;Rolling Left;Supine to Sit;Sit to Supine;Scooting to [facility] Rolling Right: Contact Guard/Touching assist Rolling Left: Minimal Assistance - Patient > 75% Supine to Sit: Minimal Assistance - Patient > 75%;Contact Guard/Touching assist Sit to Supine: Contact Guard/Touching assist Scooting to HOB: Minimal Assistance - Patient > 75%   Discharge Criteria: Patient will be discharged from OT if patient refuses treatment 3 consecutive times without medical reason, if treatment goals not met, if there is a change in medical status, if patient makes no progress towards goals or if patient is discharged from hospital.  The above assessment, treatment plan, treatment alternatives and goals were discussed and mutually agreed upon: by patient  Skeet Simmer 07/28/2021, 1:15 PM

## 2021-07-27 NOTE — Progress Notes (Addendum)
Inpatient Rehabilitation Admission Medication Review by a Pharmacist  A complete drug regimen review was completed for this patient to identify any potential clinically significant medication issues.  High Risk Drug Classes Is patient taking? Indication by Medication  Antipsychotic No   Anticoagulant Yes VTE ppx: Xarelto x 30 days  Antibiotic No   Opioid No   Antiplatelet No   Hypoglycemics/insulin No   Vasoactive Medication No   Chemotherapy No   Other No      Type of Medication Issue Identified Description of Issue Recommendation(s)  Drug Interaction(s) (clinically significant)     Duplicate Therapy     Allergy     No Medication Administration End Date     Incorrect Dose     Additional Drug Therapy Needed     Significant med changes from prior encounter (inform family/care partners about these prior to discharge). Olmesartan being held until blood pressure requires treatment   Other       Clinically significant medication issues were identified that warrant physician communication and completion of prescribed/recommended actions by midnight of the next day:  No   Time spent performing this drug regimen review (minutes):  20   Thank you for allowing [place] to participate in this patients care. Signe Colt, PharmD 07/27/2021 7:46 PM  **Pharmacist phone directory can be found on [email or web address] listed under [facility]**

## 2021-07-27 NOTE — Progress Notes (Signed)
Physical Therapy Treatment Patient Details Name: Kathryn Boyer MRN: [id number] DOB: Apr 30, 1956 Today's Date: 07/27/2021   History of Present Illness 65 y/o female who presents 11/25 to [facility] with multiple fxs s/p fall - left humerus fx, right foot/lateral malleolus fx and left tibial plateau fx. Transfer to [facility] for surgical intervention. s/p ORIF LLE 11/28. S/p left shoulder replacement on 11/30. PMH includes osteoporosis, HTN, anxiety.    PT Comments    Pt admitted with above diagnosis. Pt was able to transfer to bed from recliner scooting laterally with min guard assist and min cues for safety with guard assist of left LE. Pt also performed LE exercises as well.  Should progress well on Rehab.  Pt currently with functional limitations due to balance and endurance deficits. Pt will benefit from skilled PT to increase their independence and safety with mobility to allow discharge to the venue listed below.      Recommendations for follow up therapy are one component of a multi-disciplinary discharge planning process, led by the attending physician.  Recommendations may be updated based on patient status, additional functional criteria and insurance authorization.  Follow Up Recommendations  Acute inpatient rehab (3hours/day)     Assistance Recommended at Discharge Frequent or constant Supervision/Assistance  Equipment Recommendations  BSC/3in1    Recommendations for Other Services       Precautions / Restrictions Precautions Precautions: Fall;Shoulder Type of Shoulder Precautions: Conservative protocal: no shoulder PROM/AROM. Cleared for ROM of hand, wrist, and elbow. Shoulder Interventions: Shoulder sling/immobilizer;At all times;Off for dressing/bathing/exercises Precaution Booklet Issued: Yes (comment) Precaution Comments: Reviewed shoulder precautions Required Braces or Orthoses: Sling;Other Brace Other Brace: post op shoe RLE, WBAT Restrictions LUE Weight Bearing: Non weight  bearing RLE Weight Bearing: Weight bearing as tolerated LLE Weight Bearing: Non weight bearing Other Position/Activity Restrictions: No restrictions knee ROM on left     Mobility  Bed Mobility Overal bed mobility: Needs Assistance Bed Mobility: Sit to Supine       Sit to supine: Min assist   General bed mobility comments: required assistance to adjust hips and LLE    Transfers Overall transfer level: Needs assistance Equipment used: None Transfers: Bed to chair/wheelchair/BSC            Lateral/Scoot Transfers: Min guard General transfer comment: performed with verbal cues and min guard assist with LLE    Ambulation/Gait               General Gait Details: Pt is transfers only   Optometrist    Modified Rankin (Stroke Patients Only)       Balance Overall balance assessment: Needs assistance Sitting-balance support: Feet supported;No upper extremity supported Sitting balance-Leahy Scale: Good                                      Cognition Arousal/Alertness: Awake/alert Behavior During Therapy: WFL for tasks assessed/performed Overall Cognitive Status: Within Functional Limits for tasks assessed                                          Exercises General Exercises - Lower Extremity Ankle Circles/Pumps: AROM;Right;10 reps;Supine Quad Sets: AROM;Both;10 reps;Supine Gluteal Sets: AROM;Both;10 reps;Supine Heel Slides: AROM;Both;10 reps;Supine Hip ABduction/ADduction: AROM;Both;10 reps;Supine  General Comments        Pertinent Vitals/Pain Pain Assessment: Faces Faces Pain Scale: Hurts even more Pain Location: left shoulder, right foot, LLE Pain Descriptors / Indicators: Aching;Discomfort;Grimacing;Operative site guarding Pain Intervention(s): Limited activity within patient's tolerance;Monitored during session;Repositioned;Premedicated before session    Home Living                           Prior Function            PT Goals (current goals can now be found in the care plan section) Progress towards PT goals: Progressing toward goals    Frequency    Min 4X/week      PT Plan Current plan remains appropriate    Co-evaluation              AM-PAC PT "6 Clicks" Mobility   Outcome Measure  Help needed turning from your back to your side while in a flat bed without using bedrails?: A Little Help needed moving from lying on your back to sitting on the side of a flat bed without using bedrails?: A Little Help needed moving to and from a bed to a chair (including a wheelchair)?: A Little Help needed standing up from a chair using your arms (e.g., wheelchair or bedside chair)?: A Lot Help needed to walk in hospital room?: Total Help needed climbing 3-5 steps with a railing? : Total 6 Click Score: 13    End of Session Equipment Utilized During Treatment: Gait belt Activity Tolerance: Patient tolerated treatment well Patient left: with call bell/phone within reach;in bed;with bed alarm set Nurse Communication: Mobility status PT Visit Diagnosis: History of falling (Z91.81);Muscle weakness (generalized) (M62.81);Pain Pain - Right/Left: Left Pain - part of body: Ankle and joints of foot;Leg     Time: 2025-4270 PT Time Calculation (min) (ACUTE ONLY): 26 min  Charges:  $Therapeutic Exercise: 8-22 mins $Therapeutic Activity: 8-22 mins                     Kathryn Boyer,PT Acute Rehab Services [phone] (pager)    Kathryn Boyer 07/27/2021, 3:48 PM

## 2021-07-27 NOTE — Progress Notes (Signed)
[facility] PROGRESS NOTE    Progress Note  Alita Waldren  DPO:[id number] DOB: 29-Jan-1956 DOA: 07/20/2021 PCP: Donato Schultz, DO     Brief Narrative:   Destinee Taber is an 65 y.o. female past medical history of hypothyroidism presents was along the ED on 07/21/2019 with complaint of ankle and shoulder pain following a mechanical fall in the ED imaging showed left humerus fracture, left tibial fracture, right foot metatarsal and lateral malleolus fracture with left foot trimalleolar ankle fracture and left scaphoid deformity orthopedic surgery was consulted and admitted by hospitalist    Assessment/Plan:   Accidental fall with subsequent polytrauma: Left ankle trimalleolar fracture/left humerus 4 compartment fracture, left medial tibial plateau fracture nondisplaced fracture of the fifth metatarsal bone, nondisplaced fracture of the lateral malleolus of the right fibula in the setting of history of osteoporosis: Further management per orthopedic surgery. Patient will probably qualify for inpatient rehab.  Essential hypertension: Continue to hold antihypertensive medication blood pressure seems to be stable they can be renewed as an outpatient as blood pressure starts trending up. Will sign off.  Hyperlipidemia: Continue statins.  Hypothyroidism Continue Synthroid.  General anxiety: Continue benzos as needed.    DVT prophylaxis: Lovenox Family Communication:none Status is: Inpatient  Remains inpatient appropriate because: Trauma leading to multiple fractures in the setting of osteoporosis.        Code Status:     Code Status Orders  (From admission, onward)           Start     Ordered   07/20/21 1713  Full code  Continuous        07/20/21 1712           Code Status History     This patient has a current code status but no historical code status.      Advance Directive Documentation    Flowsheet Row Most Recent Value  Type of  Advance Directive Healthcare Power of Attorney, Living will  Pre-existing out of facility DNR order (yellow form or pink MOST form) --  "MOST" Form in Place? --         IV Access:   Peripheral IV   Procedures and diagnostic studies:   DG Shoulder Left Port  Result Date: 07/25/2021 CLINICAL DATA:  Postoperative imaging for reverse shoulder arthroplasty in a 65 year old female. EXAM: LEFT SHOULDER COMPARISON:  CT of the shoulder of July 21, 2021. FINDINGS: Immediate postoperative imaging of the LEFT shoulder in single view shows interval insertion of a reverse shoulder arthroplasty. Greater tuberosity fragment is seen along the superior and lateral margin of the bone prosthetic interface in the humerus. Shoulder component appears well positioned based on projection without signs of visible fracture in AP projection. Lung volumes incidentally appears a low and are incompletely evaluated in the chest. IMPRESSION: Post reverse shoulder arthroplasty with expected postoperative changes. Fracture fragment of greater tuberosity along the superior margin of the humeral component near the prosthetic interface. Electronically Signed   By: Donzetta Kohut M.D.   On: 07/25/2021 14:27     Medical Consultants:   None.   Subjective:    [name] no complaints  Objective:    Vitals:   07/26/21 2020 07/26/21 2020 07/27/21 0421 07/27/21 0821  BP: 100/64 100/64 113/66 127/80  Pulse: 75 75 80 77  Resp: 16 20 20 19   Temp: 98.6 F (37 C) 98.6 F (37 C) 98.2 F (36.8 C) 98.7 F (37.1 C)  TempSrc: Oral   Oral  SpO2: 94% 95% 93% 95%  Weight:      Height:       SpO2: 95 % O2 Flow Rate (L/min): 2 L/min   Intake/Output Summary (Last 24 hours) at 07/27/2021 1024 Last data filed at 07/26/2021 1700 Gross per 24 hour  Intake 240 ml  Output --  Net 240 ml    Filed Weights   07/20/21 1034 07/25/21 0900  Weight: 80.7 kg 80.7 kg    Exam: General exam: In no acute  distress. Respiratory system: Good air movement and clear to auscultation. Cardiovascular system: S1 & S2 heard, RRR. No JVD. Gastrointestinal system: Abdomen is nondistended, soft and nontender.  Extremities: No pedal edema. Skin: No rashes, lesions or ulcers   Data Reviewed:    Labs: Basic Metabolic Panel: Recent Labs  Lab 07/20/21 1131 07/21/21 0323 07/23/21 0338 07/23/21 1442 07/24/21 0125 07/25/21 2008 07/26/21 0837 07/27/21 0552  NA 136 135 134*  --  139  --  139  --   K 3.9 4.3 4.2  --  4.1  --  3.7  --   CL 105 104 102  --  106  --  103  --   CO2 22 22 24   --  26  --  27  --   GLUCOSE 145* 125* 106*  --  122*  --  104*  --   BUN 12 12 8   --  10  --  11  --   CREATININE 0.86 0.93 0.81 0.92 0.77 0.85 0.93 0.92  CALCIUM 8.8* 8.6* 8.4*  --  9.0  8.7  --  8.6*  --     GFR Estimated Creatinine Clearance: 65.7 mL/min (by C-G formula based on SCr of 0.92 mg/dL). Liver Function Tests: Recent Labs  Lab 07/21/21 0323 07/24/21 0125  AST 27 26  ALT 22 19  ALKPHOS 67 63  BILITOT 0.8 0.8  PROT 6.6 5.9*  ALBUMIN 3.9 3.0*    No results for input(s): LIPASE, AMYLASE in the last 168 hours. No results for input(s): AMMONIA in the last 168 hours. Coagulation profile No results for input(s): INR, PROTIME in the last 168 hours. COVID-19 Labs  No results for input(s): DDIMER, FERRITIN, LDH, CRP in the last 72 hours.  Lab Results  Component Value Date   SARSCOV2NAA NEGATIVE 07/20/2021    CBC: Recent Labs  Lab 07/20/21 1131 07/21/21 0323 07/23/21 0338 07/23/21 1442 07/24/21 0125 07/25/21 2008 07/26/21 0837  WBC 14.0*   < > 7.6 7.2 9.7 9.6 9.4  NEUTROABS 11.3*  --   --   --   --   --   --   HGB 14.7   < > 11.2* 10.2* 10.4* 11.4* 10.4*  HCT 43.4   < > 33.2* 31.2* 31.0* 34.0* 31.0*  MCV 91.2   < > 91.0 92.9 90.6 92.1 91.4  PLT 239   < > 165 179 211 291 267   < > = values in this interval not displayed.    Cardiac Enzymes: No results for input(s): CKTOTAL,  CKMB, CKMBINDEX, TROPONINI in the last 168 hours. BNP (last 3 results) No results for input(s): PROBNP in the last 8760 hours. CBG: No results for input(s): GLUCAP in the last 168 hours. D-Dimer: No results for input(s): DDIMER in the last 72 hours. Hgb A1c: No results for input(s): HGBA1C in the last 72 hours. Lipid Profile: No results for input(s): CHOL, HDL, LDLCALC, TRIG, CHOLHDL, LDLDIRECT in the last 72 hours. Thyroid function studies: No results  for input(s): TSH, T4TOTAL, T3FREE, THYROIDAB in the last 72 hours.  Invalid input(s): FREET3  Anemia work up: No results for input(s): VITAMINB12, FOLATE, FERRITIN, TIBC, IRON, RETICCTPCT in the last 72 hours. Sepsis Labs: Recent Labs  Lab 07/23/21 1442 07/24/21 0125 07/25/21 2008 07/26/21 0837  WBC 7.2 9.7 9.6 9.4    Microbiology Recent Results (from the past 240 hour(s))  Resp Panel by RT-PCR (Flu A&B, Covid) Nasopharyngeal Swab     Status: None   Collection Time: 07/20/21 11:31 AM   Specimen: Nasopharyngeal Swab; Nasopharyngeal(NP) swabs in vial transport medium  Result Value Ref Range Status   SARS Coronavirus 2 by RT PCR NEGATIVE NEGATIVE Final    Comment: (NOTE) SARS-CoV-2 target nucleic acids are NOT DETECTED.  The SARS-CoV-2 RNA is generally detectable in upper respiratory specimens during the acute phase of infection. The lowest concentration of SARS-CoV-2 viral copies this assay can detect is 138 copies/mL. A negative result does not preclude SARS-Cov-2 infection and should not be used as the sole basis for treatment or other patient management decisions. A negative result may occur with  improper specimen collection/handling, submission of specimen other than nasopharyngeal swab, presence of viral mutation(s) within the areas targeted by this assay, and inadequate number of viral copies(<138 copies/mL). A negative result must be combined with clinical observations, patient history, and  epidemiological information. The expected result is Negative.  Fact Sheet for Patients:  BloggerCourse.com  Fact Sheet for Healthcare Providers:  SeriousBroker.it  This test is no t yet approved or cleared by the [place] FDA and  has been authorized for detection and/or diagnosis of SARS-CoV-2 by FDA under an Emergency Use Authorization (EUA). This EUA will remain  in effect (meaning this test can be used) for the duration of the COVID-19 declaration under Section 564(b)(1) of the Act, 21 U.S.C.section 360bbb-3(b)(1), unless the authorization is terminated  or revoked sooner.       Influenza A by PCR NEGATIVE NEGATIVE Final   Influenza B by PCR NEGATIVE NEGATIVE Final    Comment: (NOTE) The Xpert Xpress SARS-CoV-2/FLU/RSV plus assay is intended as an aid in the diagnosis of influenza from Nasopharyngeal swab specimens and should not be used as a sole basis for treatment. Nasal washings and aspirates are unacceptable for Xpert Xpress SARS-CoV-2/FLU/RSV testing.  Fact Sheet for Patients: BloggerCourse.com  Fact Sheet for Healthcare Providers: SeriousBroker.it  This test is not yet approved or cleared by the [place] FDA and has been authorized for detection and/or diagnosis of SARS-CoV-2 by FDA under an Emergency Use Authorization (EUA). This EUA will remain in effect (meaning this test can be used) for the duration of the COVID-19 declaration under Section 564(b)(1) of the Act, 21 U.S.C. section 360bbb-3(b)(1), unless the authorization is terminated or revoked.  Performed at [facility], [address], [place], [place] [address]   Surgical pcr screen     Status: None   Collection Time: 07/23/21  7:45 AM   Specimen: Nasal Mucosa; Nasal Swab  Result Value Ref Range Status   MRSA, PCR NEGATIVE NEGATIVE Final   Staphylococcus aureus NEGATIVE  NEGATIVE Final    Comment: (NOTE) The Xpert SA Assay (FDA approved for NASAL specimens in patients 30 years of age and older), is one component of a comprehensive surveillance program. It is not intended to diagnose infection nor to guide or monitor treatment. Performed at [facility], [address], [place], [place] [address]      Medications:  vitamin C  500 mg Oral Daily   calcium citrate  200 mg of elemental calcium Oral BID   cholecalciferol  2,000 Units Oral BID   docusate sodium  100 mg Oral BID   enoxaparin (LOVENOX) injection  40 mg Subcutaneous Q24H   gabapentin  100 mg Oral TID   levothyroxine  100 mcg Oral Q0600   methocarbamol  750 mg Oral TID   naproxen  250 mg Oral BID WC   pravastatin  20 mg Oral Daily   Continuous Infusions:      LOS: 7 days   Marinda Elk  [facility]  07/27/2021, 10:24 AM

## 2021-07-27 NOTE — H&P (Addendum)
Physical Medicine and Rehabilitation Admission H&P    Chief Complaint  Patient presents with   Fall   Ankle Pain   Shoulder Injury  : HPI: Patient is a 65 year old white female who was walking inside of her house on November 25 going down to a landing where she misplaced her foot landing on her left shoulder and twisting her left ankle violently.  She immediately complained of pain in both feet and ankles as well as her left shoulder.  There is no loss of consciousness.  Patient suffered a left depressed medial tibial plateau fracture, a left trimalleolar ankle fracture/dislocation and ruptured syndesmosis, left proximal humerus fracture, right nondisplaced lateral malleolus fracture, right fifth metatarsal base fracture.  Patient initially was seen at [facility] but then given the complexity of the fractures was sent to [facility] for further care.  On 11/28 patient underwent open reduction internal fixation of left bimalleolar ankle fracture, open reduction internal fixation of syndesmosis, closed treatment of left medial condyle fracture of the tibia by Dr. Carola Frost.  It was felt that the left split proximal humerus fracture was unreconstructable. Dr. Everardo Pacific was consulted and the patient underwent a left reverse total shoulder arthroplasty on 07/25/2021.  Patient is nonweightbearing left upper extremity, okay to use arm for ADLs at waist level, sling at all times except for ADLs and waist level exercises.  Patient is nonweightbearing for 8 weeks left lower extremity in splint.  Plan will to be convert patient to cam boot in about 2 weeks.  Patient is weightbearing as tolerated right lower extremity in postop shoe.  Patient was placed on Lovenox 40 mg every 24 hours for DVT prophylaxis.  Patient reports not much appetite.  She has not moved her bowels since coming to the hospital.  Medications for pain have been effective for controlling her pain although they do make her a bit  sleepy.   Review of Systems  Constitutional:  Negative for chills and fever.  HENT:  Negative for hearing loss and tinnitus.   Eyes:  Negative for blurred vision and double vision.  Respiratory:  Negative for cough, shortness of breath and wheezing.   Cardiovascular:  Negative for chest pain and palpitations.  Gastrointestinal:  Negative for nausea and vomiting.  Genitourinary:  Positive for frequency. Negative for dysuria.  Musculoskeletal:  Positive for joint pain and myalgias.  Skin:  Negative for rash.  Neurological:  Positive for weakness. Negative for dizziness and headaches.  Psychiatric/Behavioral:  Negative for depression. The patient is not nervous/anxious.   Past Medical History:  Diagnosis Date   Anxiety    Asthma    Hyperlipidemia    Hypertension    Plantar fasciitis    Thyroid disease    Past Surgical History:  Procedure Laterality Date   ABDOMINAL HYSTERECTOMY     BREAST SURGERY     NOSE SURGERY     ORIF ANKLE FRACTURE Left 07/23/2021   Procedure: OPEN REDUCTION INTERNAL FIXATION (ORIF) ANKLE FRACTURE;  Surgeon: Myrene Galas, MD;  Location: [facility] OR;  Service: Orthopedics;  Laterality: Left;   Family History  Problem Relation Age of Onset   Heart disease Mother    Stroke Mother    Hypertension Mother    Cancer Mother        ovarian   Aneurysm Father    Hypertension Father    Heart disease Father    Kidney disease Father    Heart disease Sister    Social History:  reports that she has never smoked. She has never used smokeless tobacco. She reports that she does not drink alcohol and does not use drugs. Allergies:  Allergies  Allergen Reactions   Contrast Media [Iodinated Diagnostic Agents] Hives   Isovue [Iopamidol] Hives    After returning home pt called to inform technologist of rash to face and neck, denies difficulty breathing or swallowing, pt took 2 benedryl Will need premeds in future   Amoxicillin Other (See Comments)     Headache  Cephalosporin tolerant 07/23/2021   Belviq Xr [Lorcaserin Hcl Er] Itching    Caused her face to turn red.   Saxenda [Liraglutide -Weight Management] Other (See Comments)    Caused indigestion.   Penicillins Rash    Cephalosporin Tolerant 07/23/2021    Medications Prior to Admission  Medication Sig Dispense Refill   ALPRAZolam (XANAX) 0.25 MG tablet Take 1 tablet (0.25 mg total) by mouth daily as needed. (Patient taking differently: Take 0.25 mg by mouth daily as needed for anxiety.) 30 tablet 1   levothyroxine (SYNTHROID) 100 MCG tablet TAKE 1 TABLET BY MOUTH EVERY DAY (Patient taking differently: Take 100 mcg by mouth daily before breakfast.) 90 tablet 1   olmesartan (BENICAR) 40 MG tablet TAKE 1 TABLET(40 MG) BY MOUTH DAILY (Patient taking differently: Take 40 mg by mouth daily.) 90 tablet 1   pravastatin (PRAVACHOL) 20 MG tablet TAKE 1 TABLET(20 MG) BY MOUTH DAILY (Patient taking differently: Take 20 mg by mouth daily.) 90 tablet 1   cyclobenzaprine (FLEXERIL) 5 MG tablet Take 1 tablet (5 mg total) by mouth 3 (three) times daily as needed for muscle spasms. (Patient not taking: Reported on 07/20/2021) 30 tablet 1   estradiol (ESTRACE) 0.1 MG/GM vaginal cream Place 1 Applicatorful vaginally 3 (three) times a week. (Patient not taking: Reported on 07/20/2021) 42.5 g 3   estradiol (VIVELLE-DOT) 0.025 MG/24HR Place 1 patch onto the skin 2 (two) times a week. (Patient not taking: Reported on 07/20/2021) 24 patch 5   famotidine (PEPCID) 20 MG tablet TAKE 1 TABLET BY MOUTH TWICE DAILY (Patient not taking: Reported on 07/20/2021) 180 tablet 1   flunisolide (NASALIDE) 25 MCG/ACT (0.025%) SOLN Place 2 sprays into the nose 2 (two) times daily. (Patient not taking: Reported on 07/20/2021) 1 Bottle 5   fluticasone (FLOVENT HFA) 110 MCG/ACT inhaler Inhale 2 puffs into the lungs 2 (two) times daily. (Patient not taking: Reported on 07/20/2021) 1 Inhaler 5   montelukast (SINGULAIR) 10 MG tablet  Take 1 tablet (10 mg total) by mouth at bedtime. (Patient not taking: Reported on 07/20/2021) 30 tablet 5   risedronate (ACTONEL) 35 MG tablet TAKE 1 TAB BY MOUTH EVERY 7 DAYS WITH WATER ON EMPTY STOMACH, NOTHING BY MOUTH OR LIE DOWN FOR NEXT 30 MINUTES. (Patient not taking: Reported on 03/06/2021) 12 tablet 1   VENTOLIN HFA 108 (90 Base) MCG/ACT inhaler Inhale 2 puffs into the lungs every 4 (four) hours as needed for wheezing or shortness of breath. (Patient not taking: Reported on 07/20/2021) 18 g 1   Vitamin D, Ergocalciferol, (DRISDOL) 1.25 MG (50000 UT) CAPS capsule TAKE 1 CAPSULE (50,000 UNITS TOTAL) BY MOUTH EVERY 7 (SEVEN) DAYS. (Patient not taking: Reported on 03/06/2021) 12 capsule 1    Drug Regimen Review  Drug regimen was reviewed and remains appropriate with no significant issues identified  Home: Home Living Family/patient expects to be discharged to:: Private residence Living Arrangements: Spouse/significant other Available Help at Discharge: Family Type of Home: House Home Access:  Stairs to enter [employer] of Steps: 3 + 3 Entrance Stairs-Rails: None Home Layout: Two level, Able to live on main level with bedroom/bathroom Bathroom Shower/Tub: Tub/shower unit, Health visitor: Handicapped height Home Equipment: Wheelchair - manual, Agricultural consultant (2 wheels), Software engineer History: Prior Function Prior Level of Function : Independent/Modified Independent, Working/employed, Driving Mobility Comments: 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 Goes to clinic for osteoporosis once /week  Functional Status:  Mobility: Bed Mobility Overal bed mobility: Needs Assistance Bed Mobility: Sit to Supine Supine to sit: HOB elevated, Min assist Sit to supine: Min assist General bed mobility comments: required assistance to adjust hips and LLE Transfers Overall transfer level: Needs assistance Equipment used: None Transfers: Bed to  chair/wheelchair/BSC Bed to/from chair/wheelchair/BSC transfer type:: Lateral/scoot transfer  Lateral/Scoot Transfers: Min assist General transfer comment: performed with verbal cues and min assist with LLE Ambulation/Gait General Gait Details: Deferred, will await post surgery and clarify if pt can use platform RW    ADL: ADL Overall ADL's : Needs assistance/impaired Eating/Feeding: Set up Grooming: Wash/dry face, Bed level, Set up Upper Body Bathing: Moderate assistance, Sitting Upper Body Bathing Details (indicate cue type and reason): required assistance with under arms and back Lower Body Bathing: Moderate assistance, Bed level Lower Body Bathing Details (indicate cue type and reason): patient performed perineal cleaning at bed level Upper Body Dressing : Moderate assistance, Sitting Upper Body Dressing Details (indicate cue type and reason): changed gown and readjusted sling Lower Body Dressing: Maximal assistance, Sitting/lateral leans Toilet Transfer: Minimal assistance, Transfer board (drop arm recliner) Toilet Transfer Details (indicate cue type and reason): Min A for managing LLE during scoot to drop arm recliner Toileting- Clothing Manipulation and Hygiene: Total assistance Functional mobility during ADLs: Minimal assistance (lateral scoot) General ADL Comments: LB bathing performed at bed level and UB seated on EOB  Cognition: Cognition Overall Cognitive Status: Within Functional Limits for tasks assessed Orientation Level: Oriented X4 Cognition Arousal/Alertness: Awake/alert Behavior During Therapy: WFL for tasks assessed/performed Overall Cognitive Status: Within Functional Limits for tasks assessed General Comments: upset upon arrival due to patient wanting to be assisted with cleaning and get into recliner  Physical Exam: Blood pressure 110/66, pulse 81, temperature 98.2 F (36.8 C), temperature source Oral, resp. rate 18, height 5\' 7"  (1.702 m), weight 80.7 kg,  SpO2 94 %. Physical Exam Constitutional:      General: She is not in acute distress.    Appearance: She is not diaphoretic.  HENT:     Head: Normocephalic and atraumatic.     Right Ear: External ear normal.     Left Ear: External ear normal.     Nose: Nose normal.     Mouth/Throat:     Mouth: Mucous membranes are moist.  Eyes:     General: No scleral icterus.    Extraocular Movements: Extraocular movements intact.     Conjunctiva/sclera: Conjunctivae normal.     Pupils: Pupils are equal, round, and reactive to light.  Cardiovascular:     Rate and Rhythm: Normal rate.     Heart sounds: No murmur heard.   No gallop.  Pulmonary:     Effort: Pulmonary effort is normal. No respiratory distress.     Breath sounds: No wheezing or rales.  Abdominal:     General: Bowel sounds are normal. There is no distension.     Palpations: Abdomen is soft.     Tenderness: There is no abdominal tenderness.  Musculoskeletal:  Cervical back: Normal range of motion and neck supple.     Comments: LUE in sling. Proximal arm with bruising and significant swelling. LLE in splint, toes with mild swelling. RUE with mild generalized swelling and tenderness  Skin:    Comments: Bruising proximal LUE, operative incision dressed with post-op dressing. Substantial bruising around the right ankle and proximal foot. Skin warm in all 4 limbs  Neurological:     Mental Status: She is alert and oriented to person, place, and time.     Comments: Alert and oriented x 3. Normal insight and awareness. Intact Memory. Normal language and speech. Cranial nerve exam unremarkable. Able to grip with left hand and move elbow within sling. RUE 5/5. RUE 3-4/5 prox to distal, limited more at ankle and foot by pain and swelling. Able to move left leg side to side and wiggle toes within splint. Normal sensation in all 4's. DTR's 1+  Psychiatric:        Mood and Affect: Mood normal.        Thought Content: Thought content normal.      Comments: Appeared fatigue    Results for orders placed or performed during the hospital encounter of 07/20/21 (from the past 48 hour(s))  CBC     Status: Abnormal   Collection Time: 07/25/21  8:08 PM  Result Value Ref Range   WBC 9.6 4.0 - 10.5 K/uL   RBC 3.69 (L) 3.87 - 5.11 MIL/uL   Hemoglobin 11.4 (L) 12.0 - 15.0 g/dL   HCT 12.8 (L) 78.6 - 76.7 %   MCV 92.1 80.0 - 100.0 fL   MCH 30.9 26.0 - 34.0 pg   MCHC 33.5 30.0 - 36.0 g/dL   RDW 20.9 47.0 - 96.2 %   Platelets 291 150 - 400 K/uL   nRBC 0.0 0.0 - 0.2 %    Comment: Performed at [facility], [address], [place], [place] [address]  Creatinine, serum     Status: None   Collection Time: 07/25/21  8:08 PM  Result Value Ref Range   Creatinine, Ser 0.85 0.44 - 1.00 mg/dL   GFR, Estimated >94 >76 mL/min    Comment: (NOTE) Calculated using the CKD-EPI Creatinine Equation (2021) Performed at [facility], [address] [place], [place] [address]   CBC     Status: Abnormal   Collection Time: 07/26/21  8:37 AM  Result Value Ref Range   WBC 9.4 4.0 - 10.5 K/uL   RBC 3.39 (L) 3.87 - 5.11 MIL/uL   Hemoglobin 10.4 (L) 12.0 - 15.0 g/dL   HCT 35.4 (L) 65.6 - 81.2 %   MCV 91.4 80.0 - 100.0 fL   MCH 30.7 26.0 - 34.0 pg   MCHC 33.5 30.0 - 36.0 g/dL   RDW 75.1 70.0 - 17.4 %   Platelets 267 150 - 400 K/uL   nRBC 0.0 0.0 - 0.2 %    Comment: Performed at [facility], [address] [place], [place] [address]  Basic metabolic panel     Status: Abnormal   Collection Time: 07/26/21  8:37 AM  Result Value Ref Range   Sodium 139 135 - 145 mmol/L   Potassium 3.7 3.5 - 5.1 mmol/L   Chloride 103 98 - 111 mmol/L   CO2 27 22 - 32 mmol/L   Glucose, Bld 104 (H) 70 - 99 mg/dL    Comment: Glucose reference range applies only to samples taken after fasting for at least  8 hours.   BUN 11 8 - 23 mg/dL   Creatinine, Ser 6.23 0.44 - 1.00 mg/dL   Calcium 8.6 (L) 8.9 - 10.3 mg/dL   GFR, Estimated >76 >28 mL/min    Comment:  (NOTE) Calculated using the CKD-EPI Creatinine Equation (2021)    Anion gap 9 5 - 15    Comment: Performed at [facility], [address], [place], [place] [address]  Creatinine, serum     Status: None   Collection Time: 07/27/21  5:52 AM  Result Value Ref Range   Creatinine, Ser 0.92 0.44 - 1.00 mg/dL   GFR, Estimated >61 >60 mL/min    Comment: (NOTE) Calculated using the CKD-EPI Creatinine Equation (2021) Performed at [facility], [address] [place], [place] [address]    DG Shoulder Left Port  Result Date: 07/25/2021 CLINICAL DATA:  Postoperative imaging for reverse shoulder arthroplasty in a 65 year old female. EXAM: LEFT SHOULDER COMPARISON:  CT of the shoulder of July 21, 2021. FINDINGS: Immediate postoperative imaging of the LEFT shoulder in single view shows interval insertion of a reverse shoulder arthroplasty. Greater tuberosity fragment is seen along the superior and lateral margin of the bone prosthetic interface in the humerus. Shoulder component appears well positioned based on projection without signs of visible fracture in AP projection. Lung volumes incidentally appears a low and are incompletely evaluated in the chest. IMPRESSION: Post reverse shoulder arthroplasty with expected postoperative changes. Fracture fragment of greater tuberosity along the superior margin of the humeral component near the prosthetic interface. Electronically Signed   By: Donzetta Kohut M.D.   On: 07/25/2021 14:27       Medical Problem List and Plan: 1. Functional deficits secondary to polytrauma including left tibial plateau fx and left trimalleolar fx with syndemosis rupture as well as left proximal humerus fx which required reverse TSA and right distal fib fx and 5th MT fx  -NWB LLE x 8 weeks--convert to CAM in 2 weeks  -NWB LUE-may use left arm for ADL's at waist level  -WBAT RLE with post-op shoe for transfers  -patient may not yet shower  -ELOS/Goals: 5-7 days, sup  to min at w/c level for PT/OT 2.  Antithrombotics: -DVT/anticoagulation:  Xarelto 10mg  daily  -antiplatelet therapy: n/a 3. Pain Management: oxycodone prn for moderate to severe pain,   -tylenol prn for mild pain  -robaxin 750mg  tid for muscle spasms  -naproxen 250mg  bid with meals  -gabapentin 100mg  tid  -ice prn 4. Mood/generalized anxiety disorder  -team to provide ego support as necessary  -antipsychotic agents: n/a  -transition to xanax which she was using PRN at home (being given valium on acute)  -trazodone prn for sleep 5. Neuropsych: This patient is capable of making decisions on her own behalf. 6. Skin/Wound Care: continue post-op dressing to left shoulder.   -pt to remain in splint LLE for 2 weeks with transition to CAM boot 7. Fluids/Electrolytes/Nutrition: encourage appropriate PO  -check labs Monday 8. Slow transit constipation  -pt not eating much, encouraged better intake  -begin scheduled miralax and senna-s 2 tabs at bedtime 9. Hypothyroid  -synthroid daily 10. Bones/hx f osteoporosis: calcium and vitamin d daily  -updated DEXA and further rx as outpt   -pt was on actonel PTA 11. Hyperlipidemia: pt on pravastatin and olmesartan at home  -continue pravastatin for now per acute regimen 12. Hx of HTN: controlled without meds currently 13. Bladder: oob to void when possible. Can use purewick if  needed at [facility] initially       Ranelle Oyster, MD 07/27/2021

## 2021-07-27 NOTE — H&P (Signed)
Physical Medicine and Rehabilitation Admission H&P        Chief Complaint  Patient presents with   Fall   Ankle Pain   Shoulder Injury  : HPI: Patient is a 65 year old white female who was walking inside of her house on November 25 going down to a landing where she misplaced her foot landing on her left shoulder and twisting her left ankle violently.  She immediately complained of pain in both feet and ankles as well as her left shoulder.  There is no loss of consciousness.  Patient suffered a left depressed medial tibial plateau fracture, a left trimalleolar ankle fracture/dislocation and ruptured syndesmosis, left proximal humerus fracture, right nondisplaced lateral malleolus fracture, right fifth metatarsal base fracture.  Patient initially was seen at [facility] but then given the complexity of the fractures was sent to [facility] for further care.  On 11/28 patient underwent open reduction internal fixation of left bimalleolar ankle fracture, open reduction internal fixation of syndesmosis, closed treatment of left medial condyle fracture of the tibia by Dr. Carola Frost.  It was felt that the left split proximal humerus fracture was unreconstructable. Dr. Everardo Pacific was consulted and the patient underwent a left reverse total shoulder arthroplasty on 07/25/2021.  Patient is nonweightbearing left upper extremity, okay to use arm for ADLs at waist level, sling at all times except for ADLs and waist level exercises.  Patient is nonweightbearing for 8 weeks left lower extremity in splint.  Plan will to be convert patient to cam boot in about 2 weeks.  Patient is weightbearing as tolerated right lower extremity in postop shoe.  Patient was placed on Lovenox 40 mg every 24 hours for DVT prophylaxis.  Patient reports not much appetite.  She has not moved her bowels since coming to the hospital.  Medications for pain have been effective for controlling her pain although they do make her a bit  sleepy.    Review of Systems  Constitutional:  Negative for chills and fever.  HENT:  Negative for hearing loss and tinnitus.   Eyes:  Negative for blurred vision and double vision.  Respiratory:  Negative for cough, shortness of breath and wheezing.   Cardiovascular:  Negative for chest pain and palpitations.  Gastrointestinal:  Negative for nausea and vomiting.  Genitourinary:  Positive for frequency. Negative for dysuria.  Musculoskeletal:  Positive for joint pain and myalgias.  Skin:  Negative for rash.  Neurological:  Positive for weakness. Negative for dizziness and headaches.  Psychiatric/Behavioral:  Negative for depression. The patient is not nervous/anxious.       Past Medical History:  Diagnosis Date   Anxiety     Asthma     Hyperlipidemia     Hypertension     Plantar fasciitis     Thyroid disease           Past Surgical History:  Procedure Laterality Date   ABDOMINAL HYSTERECTOMY       BREAST SURGERY       NOSE SURGERY       ORIF ANKLE FRACTURE Left 07/23/2021    Procedure: OPEN REDUCTION INTERNAL FIXATION (ORIF) ANKLE FRACTURE;  Surgeon: Myrene Galas, MD;  Location: [facility] OR;  Service: Orthopedics;  Laterality: Left;         Family History  Problem Relation Age of Onset   Heart disease Mother     Stroke Mother     Hypertension Mother     Cancer Mother  ovarian   Aneurysm Father     Hypertension Father     Heart disease Father     Kidney disease Father     Heart disease Sister      Social History:  reports that she has never smoked. She has never used smokeless tobacco. She reports that she does not drink alcohol and does not use drugs. Allergies:       Allergies  Allergen Reactions   Contrast Media [Iodinated Diagnostic Agents] Hives   Isovue [Iopamidol] Hives      After returning home pt called to inform technologist of rash to face and neck, denies difficulty breathing or swallowing, pt took 2 benedryl Will need premeds in future    Amoxicillin Other (See Comments)      Headache   Cephalosporin tolerant 07/23/2021   Belviq Xr [Lorcaserin Hcl Er] Itching      Caused her face to turn red.   Saxenda [Liraglutide -Weight Management] Other (See Comments)      Caused indigestion.   Penicillins Rash      Cephalosporin Tolerant 07/23/2021            Medications Prior to Admission  Medication Sig Dispense Refill   ALPRAZolam (XANAX) 0.25 MG tablet Take 1 tablet (0.25 mg total) by mouth daily as needed. (Patient taking differently: Take 0.25 mg by mouth daily as needed for anxiety.) 30 tablet 1   levothyroxine (SYNTHROID) 100 MCG tablet TAKE 1 TABLET BY MOUTH EVERY DAY (Patient taking differently: Take 100 mcg by mouth daily before breakfast.) 90 tablet 1   olmesartan (BENICAR) 40 MG tablet TAKE 1 TABLET(40 MG) BY MOUTH DAILY (Patient taking differently: Take 40 mg by mouth daily.) 90 tablet 1   pravastatin (PRAVACHOL) 20 MG tablet TAKE 1 TABLET(20 MG) BY MOUTH DAILY (Patient taking differently: Take 20 mg by mouth daily.) 90 tablet 1   cyclobenzaprine (FLEXERIL) 5 MG tablet Take 1 tablet (5 mg total) by mouth 3 (three) times daily as needed for muscle spasms. (Patient not taking: Reported on 07/20/2021) 30 tablet 1   estradiol (ESTRACE) 0.1 MG/GM vaginal cream Place 1 Applicatorful vaginally 3 (three) times a week. (Patient not taking: Reported on 07/20/2021) 42.5 g 3   estradiol (VIVELLE-DOT) 0.025 MG/24HR Place 1 patch onto the skin 2 (two) times a week. (Patient not taking: Reported on 07/20/2021) 24 patch 5   famotidine (PEPCID) 20 MG tablet TAKE 1 TABLET BY MOUTH TWICE DAILY (Patient not taking: Reported on 07/20/2021) 180 tablet 1   flunisolide (NASALIDE) 25 MCG/ACT (0.025%) SOLN Place 2 sprays into the nose 2 (two) times daily. (Patient not taking: Reported on 07/20/2021) 1 Bottle 5   fluticasone (FLOVENT HFA) 110 MCG/ACT inhaler Inhale 2 puffs into the lungs 2 (two) times daily. (Patient not taking: Reported on  07/20/2021) 1 Inhaler 5   montelukast (SINGULAIR) 10 MG tablet Take 1 tablet (10 mg total) by mouth at bedtime. (Patient not taking: Reported on 07/20/2021) 30 tablet 5   risedronate (ACTONEL) 35 MG tablet TAKE 1 TAB BY MOUTH EVERY 7 DAYS WITH WATER ON EMPTY STOMACH, NOTHING BY MOUTH OR LIE DOWN FOR NEXT 30 MINUTES. (Patient not taking: Reported on 03/06/2021) 12 tablet 1   VENTOLIN HFA 108 (90 Base) MCG/ACT inhaler Inhale 2 puffs into the lungs every 4 (four) hours as needed for wheezing or shortness of breath. (Patient not taking: Reported on 07/20/2021) 18 g 1   Vitamin D, Ergocalciferol, (DRISDOL) 1.25 MG (50000 UT) CAPS capsule TAKE 1 CAPSULE (  50,000 UNITS TOTAL) BY MOUTH EVERY 7 (SEVEN) DAYS. (Patient not taking: Reported on 03/06/2021) 12 capsule 1      Drug Regimen Review  Drug regimen was reviewed and remains appropriate with no significant issues identified   Home: Home Living Family/patient expects to be discharged to:: Private residence Living Arrangements: Spouse/significant other Available Help at Discharge: Family Type of Home: House Home Access: Stairs to enter Secretary/administrator of Steps: 3 + 3 Entrance Stairs-Rails: None Home Layout: Two level, Able to live on main level with bedroom/bathroom Bathroom Shower/Tub: Tub/shower unit, Heritage manager Toilet: Handicapped height Home Equipment: Wheelchair - manual, Agricultural consultant (2 wheels), Software engineer History: Prior Function Prior Level of Function : Independent/Modified Independent, Working/employed, Driving Mobility Comments: 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 Goes to clinic for osteoporosis once /week   Functional Status:  Mobility: Bed Mobility Overal bed mobility: Needs Assistance Bed Mobility: Sit to Supine Supine to sit: HOB elevated, Min assist Sit to supine: Min assist General bed mobility comments: required assistance to adjust hips and LLE Transfers Overall transfer level: Needs  assistance Equipment used: None Transfers: Bed to chair/wheelchair/BSC Bed to/from chair/wheelchair/BSC transfer type:: Lateral/scoot transfer  Lateral/Scoot Transfers: Min assist General transfer comment: performed with verbal cues and min assist with LLE Ambulation/Gait General Gait Details: Deferred, will await post surgery and clarify if pt can use platform RW   ADL: ADL Overall ADL's : Needs assistance/impaired Eating/Feeding: Set up Grooming: Wash/dry face, Bed level, Set up Upper Body Bathing: Moderate assistance, Sitting Upper Body Bathing Details (indicate cue type and reason): required assistance with under arms and back Lower Body Bathing: Moderate assistance, Bed level Lower Body Bathing Details (indicate cue type and reason): patient performed perineal cleaning at bed level Upper Body Dressing : Moderate assistance, Sitting Upper Body Dressing Details (indicate cue type and reason): changed gown and readjusted sling Lower Body Dressing: Maximal assistance, Sitting/lateral leans Toilet Transfer: Minimal assistance, Transfer board (drop arm recliner) Toilet Transfer Details (indicate cue type and reason): Min A for managing LLE during scoot to drop arm recliner Toileting- Clothing Manipulation and Hygiene: Total assistance Functional mobility during ADLs: Minimal assistance (lateral scoot) General ADL Comments: LB bathing performed at bed level and UB seated on EOB   Cognition: Cognition Overall Cognitive Status: Within Functional Limits for tasks assessed Orientation Level: Oriented X4 Cognition Arousal/Alertness: Awake/alert Behavior During Therapy: WFL for tasks assessed/performed Overall Cognitive Status: Within Functional Limits for tasks assessed General Comments: upset upon arrival due to patient wanting to be assisted with cleaning and get into recliner   Physical Exam: Blood pressure 110/66, pulse 81, temperature 98.2 F (36.8 C), temperature source Oral,  resp. rate 18, height  (1.702 m), weight 80.7 kg, SpO2 94 %. Physical Exam Constitutional:      General: She is not in acute distress.    Appearance: She is not diaphoretic.  HENT:     Head: Normocephalic and atraumatic.     Right Ear: External ear normal.     Left Ear: External ear normal.     Nose: Nose normal.     Mouth/Throat:     Mouth: Mucous membranes are moist.  Eyes:     General: No scleral icterus.    Extraocular Movements: Extraocular movements intact.     Conjunctiva/sclera: Conjunctivae normal.     Pupils: Pupils are equal, round, and reactive to light.  Cardiovascular:     Rate and Rhythm: Normal rate.     Heart sounds:  No murmur heard.   No gallop.  Pulmonary:     Effort: Pulmonary effort is normal. No respiratory distress.     Breath sounds: No wheezing or rales.  Abdominal:     General: Bowel sounds are normal. There is no distension.     Palpations: Abdomen is soft.     Tenderness: There is no abdominal tenderness.  Musculoskeletal:     Cervical back: Normal range of motion and neck supple.     Comments: LUE in sling. Proximal arm with bruising and significant swelling. LLE in splint, toes with mild swelling. RUE with mild generalized swelling and tenderness  Skin:    Comments: Bruising proximal LUE, operative incision dressed with post-op dressing. Substantial bruising around the right ankle and proximal foot. Skin warm in all 4 limbs  Neurological:     Mental Status: She is alert and oriented to person, place, and time.     Comments: Alert and oriented x 3. Normal insight and awareness. Intact Memory. Normal language and speech. Cranial nerve exam unremarkable. Able to grip with left hand and move elbow within sling. RUE 5/5. RUE 3-4/5 prox to distal, limited more at ankle and foot by pain and swelling. Able to move left leg side to side and wiggle toes within splint. Normal sensation in all 4's. DTR's 1+  Psychiatric:        Mood and Affect: Mood normal.         Thought Content: Thought content normal.     Comments: Appeared fatigue      Lab Results Last 48 Hours        Results for orders placed or performed during the hospital encounter of 07/20/21 (from the past 48 hour(s))  CBC     Status: Abnormal    Collection Time: 07/25/21  8:08 PM  Result Value Ref Range    WBC 9.6 4.0 - 10.5 K/uL    RBC 3.69 (L) 3.87 - 5.11 MIL/uL    Hemoglobin 11.4 (L) 12.0 - 15.0 g/dL    HCT 59.2 (L) 92.4 - 46.0 %    MCV 92.1 80.0 - 100.0 fL    MCH 30.9 26.0 - 34.0 pg    MCHC 33.5 30.0 - 36.0 g/dL    RDW 46.2 86.3 - 81.7 %    Platelets 291 150 - 400 K/uL    nRBC 0.0 0.0 - 0.2 %      Comment: Performed at [facility], [address], [place], [place] [address]  Creatinine, serum     Status: None    Collection Time: 07/25/21  8:08 PM  Result Value Ref Range    Creatinine, Ser 0.85 0.44 - 1.00 mg/dL    GFR, Estimated >79 >03 mL/min      Comment: (NOTE) Calculated using the CKD-EPI Creatinine Equation (2021) Performed at [facility], [address] [place], [place] [address]    CBC     Status: Abnormal    Collection Time: 07/26/21  8:37 AM  Result Value Ref Range    WBC 9.4 4.0 - 10.5 K/uL    RBC 3.39 (L) 3.87 - 5.11 MIL/uL    Hemoglobin 10.4 (L) 12.0 - 15.0 g/dL    HCT 32.9 (L) 19.1 - 46.0 %    MCV 91.4 80.0 - 100.0 fL    MCH 30.7 26.0 - 34.0 pg    MCHC 33.5 30.0 - 36.0 g/dL    RDW 66.0 60.0 - 45.9 %  Platelets 267 150 - 400 K/uL    nRBC 0.0 0.0 - 0.2 %      Comment: Performed at [facility], [address], [place], [place] [address]  Basic metabolic panel     Status: Abnormal    Collection Time: 07/26/21  8:37 AM  Result Value Ref Range    Sodium 139 135 - 145 mmol/L    Potassium 3.7 3.5 - 5.1 mmol/L    Chloride 103 98 - 111 mmol/L    CO2 27 22 - 32 mmol/L    Glucose, Bld 104 (H) 70 - 99 mg/dL      Comment: Glucose reference range applies only to samples taken after fasting for at least 8 hours.    BUN 11 8 - 23  mg/dL    Creatinine, Ser 7.82 0.44 - 1.00 mg/dL    Calcium 8.6 (L) 8.9 - 10.3 mg/dL    GFR, Estimated >95 >62 mL/min      Comment: (NOTE) Calculated using the CKD-EPI Creatinine Equation (2021)      Anion gap 9 5 - 15      Comment: Performed at [facility], [address], [place], [place] [address]  Creatinine, serum     Status: None    Collection Time: 07/27/21  5:52 AM  Result Value Ref Range    Creatinine, Ser 0.92 0.44 - 1.00 mg/dL    GFR, Estimated >57 >84 mL/min      Comment: (NOTE) Calculated using the CKD-EPI Creatinine Equation (2021) Performed at [facility], [address], [place], [place] [address]         Imaging Results (Last 48 hours)  DG Shoulder Left Port   Result Date: 07/25/2021 CLINICAL DATA:  Postoperative imaging for reverse shoulder arthroplasty in a 65 year old female. EXAM: LEFT SHOULDER COMPARISON:  CT of the shoulder of July 21, 2021. FINDINGS: Immediate postoperative imaging of the LEFT shoulder in single view shows interval insertion of a reverse shoulder arthroplasty. Greater tuberosity fragment is seen along the superior and lateral margin of the bone prosthetic interface in the humerus. Shoulder component appears well positioned based on projection without signs of visible fracture in AP projection. Lung volumes incidentally appears a low and are incompletely evaluated in the chest. IMPRESSION: Post reverse shoulder arthroplasty with expected postoperative changes. Fracture fragment of greater tuberosity along the superior margin of the humeral component near the prosthetic interface. Electronically Signed   By: Donzetta Kohut M.D.   On: 07/25/2021 14:27             Medical Problem List and Plan: 1. Functional deficits secondary to polytrauma including left tibial plateau fx and left trimalleolar fx with syndemosis rupture as well as left proximal humerus fx which required reverse TSA and right distal fib fx and 5th MT fx              -NWB LLE x 8 weeks--convert to CAM in 2 weeks             -NWB LUE-may use left arm for ADL's at waist level             -WBAT RLE with post-op shoe for transfers             -patient may not yet shower             -ELOS/Goals: 5-7 days, sup to min at w/c level for PT/OT 2.  Antithrombotics: -DVT/anticoagulation:  Xarelto  daily             -  antiplatelet therapy: n/a 3. Pain Management: oxycodone prn for moderate to severe pain,              -tylenol prn for mild pain             -robaxin 750mg  tid for muscle spasms             -naproxen 250mg  bid with meals             -gabapentin 100mg  tid             -ice prn 4. Mood/generalized anxiety disorder             -team to provide ego support as necessary             -antipsychotic agents: n/a             -transition to xanax which she was using PRN at home (being given valium on acute)             -trazodone prn for sleep 5. Neuropsych: This patient is capable of making decisions on her own behalf. 6. Skin/Wound Care: continue post-op dressing to left shoulder.              -pt to remain in splint LLE for 2 weeks with transition to CAM boot 7. Fluids/Electrolytes/Nutrition: encourage appropriate PO             -check labs Monday 8. Slow transit constipation             -pt not eating much, encouraged better intake             -begin scheduled miralax and senna-s 2 tabs at bedtime 9. Hypothyroid             -synthroid daily 10. Bones/hx f osteoporosis: calcium and vitamin d daily             -updated DEXA and further rx as outpt              -pt was on actonel PTA 11. Hyperlipidemia: pt on pravastatin and olmesartan at home             -continue pravastatin for now per acute regimen 12. Hx of HTN: controlled without meds currently 13. Bladder: oob to void when possible. Can use purewick if needed at HS initially          I have personally performed a face to face diagnostic evaluation of this patient and formulated  the key components of the plan.   The patient's status has not changed from the original H&P.  Any changes in documentation from the acute care chart have been noted above.  , MD, Monday

## 2021-07-27 NOTE — PMR Pre-admission (Signed)
PMR Admission Coordinator Pre-Admission Assessment  Patient: Kathryn Boyer is an 65 y.o., female MRN: [id number] DOB: 1956/04/16 Height: '5\' 7"'  (170.2 cm) Weight: 80.7 kg  Insurance Information HMO:     PPO:      PCP:      IPA:      80/20:      OTHER:  PRIMARY: Lorella [employer] of [place]     Policy#: [id number]      Subscriber: pt CM Name: Arletha Pili      Phone#: [phone] option 3     Fax#: [phone] Pre-Cert#: [id number] approved for 7 days; fax updates on 12/9 attention :UM fax# [phone] with [place] #[id number]      Employer:  Benefits:  Phone #: [phone]     Name: 12/2 Eff. Date: 08/26/2018     Deduct: $4000      Out of Pocket Max: $3000      Life Max: none CIR: 100% after deductible      SNF: 100% Outpatient: 100%      Co-Pay: 30 days no Auth required Home Health: 100%      Co-Pay: Auth required beyond 30 units up to 90 days DME: 100%     Co-Pay: no dollar maximum Providers: in network  SECONDARY: none  Financial Counselor:       Phone#:   The Engineer, petroleum" for patients in Inpatient Rehabilitation Facilities with attached "Privacy Act [place] Records" was provided and verbally reviewed with: N/A  Emergency Contact Information Contact Information     Name Relation Home Work Mobile   [place] Spouse [phone]  [phone]       Current Medical History  Patient Admitting Diagnosis: polytrauma  History of Present Illness: 65 year old female with past medial history of severe osteoporosis, anxiety, asthma, HLD, HTN, thyroid disease and plantar fascitis. who presented on 11/25 after walking outside of her house , going down a landing where she misplaced her foot and landed on her shoulder and twisting her left ankle.   Found not have left trimalleolar fracture, left humerus fracture, left tibial plateau fracture, nondisplaced fracture of fifth metatarsal bone, right foot, and nondisplaced fracture of lateral malleolus of right  fibula. Underwent ORIF left ankle, NWB for 8 weeks, splint for 2 weeks then convert to CAM. Right distal fibula and proximal 5th metatarsal Fx non operative. WBAT for transfers. Will order postop shoe to provide better fit and give more traction. Ace or compressive sock for swelling. comminuted left proximal humerus fx NWB and left reverse shoulder arthroplasty .. DVT/PE prophylaxis with Lovenox. And transition to Xarelto daily for 30 days. Discussions with ortho for her metabolic bone disease and pharmacologic treatment as well as diet, resistance training. Due for updated DEXA.   Patient's medical record from [facility]  has been reviewed by the rehabilitation admission coordinator and physician.  Past Medical History  Past Medical History:  Diagnosis Date   Anxiety    Asthma    Hyperlipidemia    Hypertension    Plantar fasciitis    Thyroid disease    Has the patient had major surgery during 100 days prior to admission? Yes  Family History   family history includes Aneurysm in her father; Cancer in her mother; Heart disease in her father, mother, and sister; Hypertension in her father and mother; Kidney disease in her father; Stroke in her mother.  Current Medications  Current Facility-Administered Medications:    acetaminophen (TYLENOL) tablet 325-650 mg, 325-650 mg, Oral, Q6H  PRN, Hiram Gash, MD   ascorbic acid (VITAMIN C) tablet 500 mg, 500 mg, Oral, Daily, McBane, Caroline N, PA-C, 500 mg at 07/27/21 4492   calcium citrate (CALCITRATE - dosed in mg elemental calcium) tablet 200 mg of elemental calcium, 200 mg of elemental calcium, Oral, BID, McBane, Caroline N, PA-C, 200 mg of elemental calcium at 07/27/21 0100   cholecalciferol (VITAMIN D3) tablet 2,000 Units, 2,000 Units, Oral, BID, McBane, Maylene Roes, PA-C, 2,000 Units at 07/27/21 7121   diazepam (VALIUM) tablet 5 mg, 5 mg, Oral, Q6H PRN, McBane, Caroline N, PA-C, 5 mg at 07/26/21 0200   diphenhydrAMINE (BENADRYL) 12.5  MG/5ML elixir 12.5-25 mg, 12.5-25 mg, Oral, Q4H PRN, McBane, Caroline N, PA-C   docusate sodium (COLACE) capsule 100 mg, 100 mg, Oral, BID, McBane, Caroline N, PA-C, 100 mg at 07/27/21 0957   gabapentin (NEURONTIN) capsule 100 mg, 100 mg, Oral, TID, McBane, Caroline N, PA-C, 100 mg at 07/27/21 0957   HYDROmorphone (DILAUDID) injection 0.5-1 mg, 0.5-1 mg, Intravenous, Q4H PRN, McBane, Caroline N, PA-C, 1 mg at 07/26/21 9758   levothyroxine (SYNTHROID) tablet 100 mcg, 100 mcg, Oral, Q0600, McBane, Maylene Roes, PA-C, 100 mcg at 07/27/21 0507   magnesium hydroxide (MILK OF MAGNESIA) suspension 30 mL, 30 mL, Oral, Daily PRN, McBane, Caroline N, PA-C   menthol-cetylpyridinium (CEPACOL) lozenge 3 mg, 1 lozenge, Oral, PRN **OR** phenol (CHLORASEPTIC) mouth spray 1 spray, 1 spray, Mouth/Throat, PRN, McBane, Caroline N, PA-C   methocarbamol (ROBAXIN) tablet 750 mg, 750 mg, Oral, TID, McBane, Caroline N, PA-C, 750 mg at 07/27/21 0956   metoCLOPramide (REGLAN) tablet 5-10 mg, 5-10 mg, Oral, Q8H PRN **OR** metoCLOPramide (REGLAN) injection 5-10 mg, 5-10 mg, Intravenous, Q8H PRN, McBane, Caroline N, PA-C   metoprolol tartrate (LOPRESSOR) injection 5 mg, 5 mg, Intravenous, Q6H PRN, McBane, Caroline N, PA-C   naloxone (NARCAN) injection 0.4 mg, 0.4 mg, Intravenous, PRN, McBane, Caroline N, PA-C   naproxen (NAPROSYN) tablet 250 mg, 250 mg, Oral, BID WC, McBane, Caroline N, PA-C, 250 mg at 07/27/21 0957   ondansetron (ZOFRAN) tablet 4 mg, 4 mg, Oral, Q6H PRN **OR** ondansetron (ZOFRAN) injection 4 mg, 4 mg, Intravenous, Q6H PRN, McBane, Caroline N, PA-C, 4 mg at 07/26/21 2130   oxyCODONE (Oxy IR/ROXICODONE) immediate release tablet 10-15 mg, 10-15 mg, Oral, Q4H PRN, McBane, Caroline N, PA-C, 15 mg at 07/27/21 8325   oxyCODONE (Oxy IR/ROXICODONE) immediate release tablet 5-10 mg, 5-10 mg, Oral, Q4H PRN, McBane, Caroline N, PA-C, 10 mg at 07/26/21 0020   polyethylene glycol (MIRALAX / GLYCOLAX) packet 17 g, 17 g, Oral,  BID PRN, Ethelda Chick, PA-C, 17 g at 07/26/21 2125   pravastatin (PRAVACHOL) tablet 20 mg, 20 mg, Oral, Daily, McBane, Caroline N, PA-C, 20 mg at 07/27/21 0959   [START ON 07/28/2021] rivaroxaban (XARELTO) tablet 10 mg, 10 mg, Oral, Daily, Reome, Earle J, RPH   senna-docusate (Senokot-S) tablet 1 tablet, 1 tablet, Oral, BID PRN, McBane, Caroline N, PA-C   sodium phosphate (FLEET) 7-19 GM/118ML enema 1 enema, 1 enema, Rectal, Once PRN, McBane, Caroline N, PA-C   zolpidem (AMBIEN) tablet 5 mg, 5 mg, Oral, QHS PRN, McBane, Caroline N, PA-C  Patients Current Diet:  Diet Order             Diet - low sodium heart healthy           Diet regular Room service appropriate? Yes; Fluid consistency: Thin  Diet effective now  Precautions / Restrictions Precautions Precautions: Fall, Shoulder Type of Shoulder Precautions: Conservative protocal: no shoulder PROM/AROM. Cleared for ROM of hand, wrist, and elbow. Precaution Booklet Issued: Yes (comment) Precaution Comments: Reviewed shoulder precautions Other Brace: post op shoe RLE, WBAT Restrictions Weight Bearing Restrictions: Yes LUE Weight Bearing: Non weight bearing RLE Weight Bearing: Weight bearing as tolerated LLE Weight Bearing: Weight bearing as tolerated Other Position/Activity Restrictions: No restrictions knee ROM on left   Has the patient had 2 or more falls or a fall with injury in the past year? Yes  Prior Activity Level Community (5-7x/wk): Independent, working and driving  Prior Functional Level Self Care: Did the patient need help bathing, dressing, using the toilet or eating? Independent  Indoor Mobility: Did the patient need assistance with walking from room to room (with or without device)? Independent  Stairs: Did the patient need assistance with internal or external stairs (with or without device)? Independent  Functional Cognition: Did the patient need help planning regular tasks such as  shopping or remembering to take medications? Independent  Patient Information Are you of Hispanic, Latino/a,or Spanish origin?: A. No, not of Hispanic, Latino/a, or Spanish origin What is your race?: A. White Do you need or want an interpreter to communicate with a doctor or health care staff?: 0. No  Patient's Response To:  Health Literacy and Transportation Is the patient able to respond to health literacy and transportation needs?: Yes Health Literacy - How often do you need to have someone help you when you read instructions, pamphlets, or other written material from your doctor or pharmacy?: Never In the past 12 months, has lack of transportation kept you from medical appointments or from getting medications?: No In the past 12 months, has lack of transportation kept you from meetings, work, or from getting things needed for daily living?: No  Development worker, international aid / [place] Devices/Equipment: Eyeglasses Home Equipment: Wheelchair - manual, Conservation officer, nature (2 wheels), Shower seat  Prior Device Use: Indicate devices/aids used by the patient prior to current illness, exacerbation or injury? None of the above  Current Functional Level Cognition  Overall Cognitive Status: Within Functional Limits for tasks assessed Orientation Level: Oriented X4 General Comments: upset upon arrival due to patient wanting to be assisted with cleaning and get into recliner    Extremity Assessment (includes Sensation/Coordination)  Upper Extremity Assessment: LUE deficits/detail LUE Deficits / Details: No shoulder ROM. Cleared hand, wrist, and elbow LUE Coordination: decreased gross motor, decreased fine motor  Lower Extremity Assessment: Defer to PT evaluation RLE Deficits / Details: swelling and discoloration around ankle, limited ankle AROM. RLE Sensation: WNL RLE Coordination: WNL LLE Deficits / Details: ABle to wiggle toes but they are numb, good AROM at knee/hip LLE Sensation:  decreased light touch (toes)    ADLs  Overall ADL's : Needs assistance/impaired Eating/Feeding: Set up Grooming: Wash/dry face, Bed level, Set up Upper Body Bathing: Moderate assistance, Sitting Upper Body Bathing Details (indicate cue type and reason): required assistance with under arms and back Lower Body Bathing: Moderate assistance, Bed level Lower Body Bathing Details (indicate cue type and reason): patient performed perineal cleaning at bed level Upper Body Dressing : Moderate assistance, Sitting Upper Body Dressing Details (indicate cue type and reason): changed gown and readjusted sling Lower Body Dressing: Maximal assistance, Sitting/lateral leans Toilet Transfer: Minimal assistance, Transfer board (drop arm recliner) Toilet Transfer Details (indicate cue type and reason): Min A for managing LLE during scoot to drop arm recliner Toileting- Clothing  Manipulation and Hygiene: Total assistance Functional mobility during ADLs: Minimal assistance (lateral scoot) General ADL Comments: LB bathing performed at bed level and UB seated on EOB    Mobility  Overal bed mobility: Needs Assistance Bed Mobility: Sit to Supine Supine to sit: HOB elevated, Min assist Sit to supine: Min assist General bed mobility comments: required assistance to adjust hips and LLE    Transfers  Overall transfer level: Needs assistance Equipment used: None Transfers: Bed to chair/wheelchair/BSC Bed to/from chair/wheelchair/BSC transfer type:: Lateral/scoot transfer  Lateral/Scoot Transfers: Min assist General transfer comment: performed with verbal cues and min assist with LLE    Ambulation / Gait / Stairs / Wheelchair Mobility  Ambulation/Gait General Gait Details: Deferred, will await post surgery and clarify if pt can use platform RW    Posture / Balance Balance Overall balance assessment: Needs assistance Sitting-balance support: Feet supported, No upper extremity supported Sitting balance-Leahy  Scale: Good    Special needs/care consideration    Previous Home Environment  Living Arrangements: Spouse/significant other  Lives With: Spouse Available Help at Discharge: Family (spouse and hired caregivers as needed) Type of Home: House Home Layout: Two level, Able to live on main level with bedroom/bathroom Home Access: Stairs to enter Entrance Stairs-Rails: None Entrance Stairs-Number of Steps: 3 + 3 Bathroom Shower/Tub: Public librarian, Multimedia programmer: Handicapped height Bathroom Accessibility: Yes How Accessible: Accessible via walker [place]: No  Discharge Living Setting Plans for Discharge Living Setting: Patient's home, Lives with (comment) (spouse) Type of Home at Discharge: House Discharge Home Layout: Two level, Able to live on main level with bedroom/bathroom Discharge Home Access: Stairs to enter Entrance Stairs-Rails: None Entrance Stairs-Number of Steps: 3 + 3 Discharge Bathroom Shower/Tub: Tub/shower unit, Walk-in shower Discharge Bathroom Toilet: Handicapped height Discharge Bathroom Accessibility: Yes How Accessible: Accessible via walker Does the patient have any problems obtaining your medications?: No  Social/Family/Support Systems Contact Information: spouse, Higher education careers adviser Anticipated Caregiver: spouse and hired caregivers Anticipated Ambulance person Information: see contacts Ability/Limitations of Caregiver: spouse works but can take some time off / 2 weeks Caregiver Availability: 24/7 Discharge Plan Discussed with Primary Caregiver: Yes Is Caregiver In Agreement with Plan?: Yes Does Caregiver/Family have Issues with Lodging/Transportation while Pt is in Rehab?: No  Goals Patient/Family Goal for Rehab: supervision to min at wheelchair level with PT And OT Expected length of stay: ELOS 5 to 7 days Pt/Family Agrees to Admission and willing to participate: Yes Program Orientation Provided & Reviewed with Pt/Caregiver Including  Roles  & Responsibilities: Yes  Decrease burden of Care through IP rehab admission: n/a  Possible need for SNF placement upon discharge: not anticipated  Patient Condition: I have reviewed medical records from [facility] , spoken with CM, and patient and spouse. I met with patient at the bedside for inpatient rehabilitation assessment.  Patient will benefit from ongoing PT and OT, can actively participate in 3 hours of therapy a day 5 days of the week, and can make measurable gains during the admission.  Patient will also benefit from the coordinated team approach during an Inpatient Acute Rehabilitation admission.  The patient will receive intensive therapy as well as Rehabilitation physician, nursing, social worker, and care management interventions.  Due to bladder management, bowel management, safety, skin/wound care, disease management, medication administration, pain management, and patient education the patient requires 24 hour a day rehabilitation nursing.  The patient is currently min to mod assist overall with mobility and basic ADLs.  Discharge setting  and therapy post discharge at home with home health is anticipated.  Patient has agreed to participate in the Acute Inpatient Rehabilitation Program and will admit today.  Preadmission Screen Completed By:  Cleatrice Burke, 07/27/2021 12:18 PM ______________________________________________________________________   Discussed status with Dr. Naaman Plummer  on  07/27/2021 at 1234 and received approval for admission today.  Admission Coordinator:  Cleatrice Burke, RN, time  8101 Date  07/27/2021   Assessment/Plan: Diagnosis: poytrauma Does the need for close, 24 hr/day Medical supervision in concert with the patient's rehab needs make it unreasonable for this patient to be served in a less intensive setting? Yes Co-Morbidities requiring supervision/potential complications: anxiety disorder, pain mgt, wound care Due to bladder  management, bowel management, safety, skin/wound care, disease management, medication administration, and pain management, does the patient require 24 hr/day rehab nursing? Yes Does the patient require coordinated care of a physician, rehab nurse, PT, OT, and SLP to address physical and functional deficits in the context of the above medical diagnosis(es)? Yes Addressing deficits in the following areas: balance, endurance, locomotion, strength, transferring, bowel/bladder control, bathing, dressing, feeding, grooming, toileting, and psychosocial support Can the patient actively participate in an intensive therapy program of at least 3 hrs of therapy 5 days a week? Yes The potential for patient to make measurable gains while on inpatient rehab is excellent Anticipated functional outcomes upon discharge from inpatient rehab: supervision and min assist PT, supervision and min assist OT, n/a SLP at w/c level Estimated rehab length of stay to reach the above functional goals is: 5-7 days Anticipated discharge destination: Home 10. Overall Rehab/Functional Prognosis: excellent   MD Signature: [name], MD, [facility] 07/27/2021

## 2021-07-27 NOTE — Progress Notes (Signed)
[name], MD  Physician Physical Medicine and Rehabilitation PMR Pre-admission    Signed Date of Service:  07/27/2021 12:18 PM  Related encounter: ED to Hosp-Admission (Current) from 07/20/2021 in [place]      Show:Clear all '[x]' Written'[x]' Templated'[]' Copied  Added by: '[x]' Hillis Mcphatter, Vertis Kelch, RN'[x]' [name], MD  '[]' Hover for details                                                                                                                                                                                                                                                                                                                                                       PMR Admission Coordinator Pre-Admission Assessment   Patient: Kathryn Boyer is an 65 y.o., female MRN: [id number] DOB: [birth year] Height: '5\' 7"'  (170.2 cm) Weight: 80.7 kg   Insurance Information HMO:     PPO:      PCP:      IPA:      80/20:      OTHER:  PRIMARYLorella [place] of [place]     Policy#: [id number]      Subscriber: pt CM Name: Arletha Pili      Phone#: [phone] option 3     Fax#: [phone] Pre-Cert#: [id number] approved for 7 days; fax updates on 12/9 attention :UM fax# [phone] with Auth #[id number]      Employer:  Benefits:  Phone #: [phone]     Name: 12/2 Eff. Date: 08/26/2018     Deduct: $4000      Out of Pocket Max: $3000      Life Max: none CIR: 100% after deductible      SNF: 100% Outpatient: 100%      Co-Pay: 30 days no Auth required Home Health: 100%  Co-Pay: Auth required beyond 30 units up to 90 days DME: 100%     Co-Pay: no dollar maximum Providers: in network  SECONDARY: none   Financial Counselor:       Phone#:    [employer]" for patients in Inpatient Rehabilitation  Facilities with attached "Privacy Act [place] Records" was provided and verbally reviewed with: N/A   Emergency Contact Information Contact Information       Name Relation Home Work Mobile    Bowers Spouse [phone]   [phone]           Current Medical History  Patient Admitting Diagnosis: polytrauma   History of Present Illness: 65 year old female with past medial history of severe osteoporosis, anxiety, asthma, HLD, HTN, thyroid disease and plantar fascitis. who presented on 11/25 after walking outside of her house , going down a landing where she misplaced her foot and landed on her shoulder and twisting her left ankle.    Found not have left trimalleolar fracture, left humerus fracture, left tibial plateau fracture, nondisplaced fracture of fifth metatarsal bone, right foot, and nondisplaced fracture of lateral malleolus of right fibula. Underwent ORIF left ankle, NWB for 8 weeks, splint for 2 weeks then convert to CAM. Right distal fibula and proximal 5th metatarsal Fx non operative. WBAT for transfers. Will order postop shoe to provide better fit and give more traction. Ace or compressive sock for swelling. comminuted left proximal humerus fx NWB and left reverse shoulder arthroplasty .. DVT/PE prophylaxis with Lovenox. And transition to Xarelto daily for 30 days. Discussions with ortho for her metabolic bone disease and pharmacologic treatment as well as diet, resistance training. Due for updated DEXA.    Patient's medical record from [facility]  has been reviewed by the rehabilitation admission coordinator and physician.   Past Medical History      Past Medical History:  Diagnosis Date   Anxiety     Asthma     Hyperlipidemia     Hypertension     Plantar fasciitis     Thyroid disease      Has the patient had major surgery during 100 days prior to admission? Yes   Family History   family history includes Aneurysm in her father; Cancer in her  mother; Heart disease in her father, mother, and sister; Hypertension in her father and mother; Kidney disease in her father; Stroke in her mother.   Current Medications   Current Facility-Administered Medications:    acetaminophen (TYLENOL) tablet 325-650 mg, 325-650 mg, Oral, Q6H PRN, Hiram Gash, MD   ascorbic acid (VITAMIN C) tablet 500 mg, 500 mg, Oral, Daily, McBane, Caroline N, PA-C, 500 mg at 07/27/21 7591   calcium citrate (CALCITRATE - dosed in mg elemental calcium) tablet 200 mg of elemental calcium, 200 mg of elemental calcium, Oral, BID, McBane, Caroline N, PA-C, 200 mg of elemental calcium at 07/27/21 6384   cholecalciferol (VITAMIN D3) tablet 2,000 Units, 2,000 Units, Oral, BID, McBane, Maylene Roes, PA-C, 2,000 Units at 07/27/21 0956   diazepam (VALIUM) tablet 5 mg, 5 mg, Oral, Q6H PRN, McBane, Caroline N, PA-C, 5 mg at 07/26/21 0200   diphenhydrAMINE (BENADRYL) 12.5 MG/5ML elixir 12.5-25 mg, 12.5-25 mg, Oral, Q4H PRN, McBane, Caroline N, PA-C   docusate sodium (COLACE) capsule 100 mg, 100 mg, Oral, BID, McBane, Caroline N, PA-C, 100 mg at 07/27/21 0957   gabapentin (NEURONTIN) capsule 100 mg, 100 mg, Oral, TID, McBane, Caroline N, PA-C, 100 mg  at 07/27/21 0957   HYDROmorphone (DILAUDID) injection 0.5-1 mg, 0.5-1 mg, Intravenous, Q4H PRN, McBane, Maylene Roes, PA-C, 1 mg at 07/26/21 3419   levothyroxine (SYNTHROID) tablet 100 mcg, 100 mcg, Oral, Q0600, Ethelda Chick, PA-C, 100 mcg at 07/27/21 0507   magnesium hydroxide (MILK OF MAGNESIA) suspension 30 mL, 30 mL, Oral, Daily PRN, McBane, Caroline N, PA-C   menthol-cetylpyridinium (CEPACOL) lozenge 3 mg, 1 lozenge, Oral, PRN **OR** phenol (CHLORASEPTIC) mouth spray 1 spray, 1 spray, Mouth/Throat, PRN, McBane, Caroline N, PA-C   methocarbamol (ROBAXIN) tablet 750 mg, 750 mg, Oral, TID, McBane, Caroline N, PA-C, 750 mg at 07/27/21 0956   metoCLOPramide (REGLAN) tablet 5-10 mg, 5-10 mg, Oral, Q8H PRN **OR** metoCLOPramide (REGLAN)  injection 5-10 mg, 5-10 mg, Intravenous, Q8H PRN, McBane, Caroline N, PA-C   metoprolol tartrate (LOPRESSOR) injection 5 mg, 5 mg, Intravenous, Q6H PRN, McBane, Caroline N, PA-C   naloxone (NARCAN) injection 0.4 mg, 0.4 mg, Intravenous, PRN, McBane, Caroline N, PA-C   naproxen (NAPROSYN) tablet 250 mg, 250 mg, Oral, BID WC, McBane, Caroline N, PA-C, 250 mg at 07/27/21 0957   ondansetron (ZOFRAN) tablet 4 mg, 4 mg, Oral, Q6H PRN **OR** ondansetron (ZOFRAN) injection 4 mg, 4 mg, Intravenous, Q6H PRN, McBane, Caroline N, PA-C, 4 mg at 07/26/21 2130   oxyCODONE (Oxy IR/ROXICODONE) immediate release tablet 10-15 mg, 10-15 mg, Oral, Q4H PRN, McBane, Caroline N, PA-C, 15 mg at 07/27/21 3790   oxyCODONE (Oxy IR/ROXICODONE) immediate release tablet 5-10 mg, 5-10 mg, Oral, Q4H PRN, McBane, Caroline N, PA-C, 10 mg at 07/26/21 0020   polyethylene glycol (MIRALAX / GLYCOLAX) packet 17 g, 17 g, Oral, BID PRN, Ethelda Chick, PA-C, 17 g at 07/26/21 2125   pravastatin (PRAVACHOL) tablet 20 mg, 20 mg, Oral, Daily, McBane, Caroline N, PA-C, 20 mg at 07/27/21 0959   [START ON 07/28/2021] rivaroxaban (XARELTO) tablet 10 mg, 10 mg, Oral, Daily, Reome, Earle J, RPH   senna-docusate (Senokot-S) tablet 1 tablet, 1 tablet, Oral, BID PRN, McBane, Caroline N, PA-C   sodium phosphate (FLEET) 7-19 GM/118ML enema 1 enema, 1 enema, Rectal, Once PRN, McBane, Caroline N, PA-C   zolpidem (AMBIEN) tablet 5 mg, 5 mg, Oral, QHS PRN, McBane, Caroline N, PA-C   Patients Current Diet:  Diet Order                  Diet - low sodium heart healthy             Diet regular Room service appropriate? Yes; Fluid consistency: Thin  Diet effective now                       Precautions / Restrictions Precautions Precautions: Fall, Shoulder Type of Shoulder Precautions: Conservative protocal: no shoulder PROM/AROM. Cleared for ROM of hand, wrist, and elbow. Precaution Booklet Issued: Yes (comment) Precaution Comments: Reviewed  shoulder precautions Other Brace: post op shoe RLE, WBAT Restrictions Weight Bearing Restrictions: Yes LUE Weight Bearing: Non weight bearing RLE Weight Bearing: Weight bearing as tolerated LLE Weight Bearing: Weight bearing as tolerated Other Position/Activity Restrictions: No restrictions knee ROM on left    Has the patient had 2 or more falls or a fall with injury in the past year? Yes   Prior Activity Level Community (5-7x/wk): Independent, working and driving   Prior Functional Level Self Care: Did the patient need help bathing, dressing, using the toilet or eating? Independent   Indoor Mobility: Did the patient need assistance with walking  from room to room (with or without device)? Independent   Stairs: Did the patient need assistance with internal or external stairs (with or without device)? Independent   Functional Cognition: Did the patient need help planning regular tasks such as shopping or remembering to take medications? Independent   Patient Information Are you of Hispanic, Latino/a,or Spanish origin?: A. No, not of Hispanic, Latino/a, or Spanish origin What is your race?: A. White Do you need or want an interpreter to communicate with a doctor or health care staff?: 0. No   Patient's Response To:  Health Literacy and Transportation Is the patient able to respond to health literacy and transportation needs?: Yes Health Literacy - How often do you need to have someone help you when you read instructions, pamphlets, or other written material from your doctor or pharmacy?: Never In the past 12 months, has lack of transportation kept you from medical appointments or from getting medications?: No In the past 12 months, has lack of transportation kept you from meetings, work, or from getting things needed for daily living?: No   Development worker, international aid / [place] Devices/Equipment: Eyeglasses Home Equipment: Wheelchair - manual, Conservation officer, nature (2 wheels),  Shower seat   Prior Device Use: Indicate devices/aids used by the patient prior to current illness, exacerbation or injury? None of the above   Current Functional Level Cognition   Overall Cognitive Status: Within Functional Limits for tasks assessed Orientation Level: Oriented X4 General Comments: upset upon arrival due to patient wanting to be assisted with cleaning and get into recliner    Extremity Assessment (includes Sensation/Coordination)   Upper Extremity Assessment: LUE deficits/detail LUE Deficits / Details: No shoulder ROM. Cleared hand, wrist, and elbow LUE Coordination: decreased gross motor, decreased fine motor  Lower Extremity Assessment: Defer to PT evaluation RLE Deficits / Details: swelling and discoloration around ankle, limited ankle AROM. RLE Sensation: WNL RLE Coordination: WNL LLE Deficits / Details: ABle to wiggle toes but they are numb, good AROM at knee/hip LLE Sensation: decreased light touch (toes)     ADLs   Overall ADL's : Needs assistance/impaired Eating/Feeding: Set up Grooming: Wash/dry face, Bed level, Set up Upper Body Bathing: Moderate assistance, Sitting Upper Body Bathing Details (indicate cue type and reason): required assistance with under arms and back Lower Body Bathing: Moderate assistance, Bed level Lower Body Bathing Details (indicate cue type and reason): patient performed perineal cleaning at bed level Upper Body Dressing : Moderate assistance, Sitting Upper Body Dressing Details (indicate cue type and reason): changed gown and readjusted sling Lower Body Dressing: Maximal assistance, Sitting/lateral leans Toilet Transfer: Minimal assistance, Transfer board (drop arm recliner) Toilet Transfer Details (indicate cue type and reason): Min A for managing LLE during scoot to drop arm recliner Toileting- Clothing Manipulation and Hygiene: Total assistance Functional mobility during ADLs: Minimal assistance (lateral scoot) General ADL  Comments: LB bathing performed at bed level and UB seated on EOB     Mobility   Overal bed mobility: Needs Assistance Bed Mobility: Sit to Supine Supine to sit: HOB elevated, Min assist Sit to supine: Min assist General bed mobility comments: required assistance to adjust hips and LLE     Transfers   Overall transfer level: Needs assistance Equipment used: None Transfers: Bed to chair/wheelchair/BSC Bed to/from chair/wheelchair/BSC transfer type:: Lateral/scoot transfer  Lateral/Scoot Transfers: Min assist General transfer comment: performed with verbal cues and min assist with LLE     Ambulation / Gait / Stairs / Emergency planning/management officer  Ambulation/Gait General Gait Details: Deferred, will await post surgery and clarify if pt can use platform RW     Posture / Balance Balance Overall balance assessment: Needs assistance Sitting-balance support: Feet supported, No upper extremity supported Sitting balance-Leahy Scale: Good     Special needs/care consideration      Previous Home Environment  Living Arrangements: Spouse/significant other  Lives With: Spouse Available Help at Discharge: Family (spouse and hired caregivers as needed) Type of Home: House Home Layout: Two level, Able to live on main level with bedroom/bathroom Home Access: Stairs to enter Entrance Stairs-Rails: None Entrance Stairs-Number of Steps: 3 + 3 Bathroom Shower/Tub: Public librarian, Multimedia programmer: Handicapped height Bathroom Accessibility: Yes How Accessible: Accessible via walker [place]: No   Discharge Living Setting Plans for Discharge Living Setting: Patient's home, Lives with (comment) (spouse) Type of Home at Discharge: House Discharge Home Layout: Two level, Able to live on main level with bedroom/bathroom Discharge Home Access: Stairs to enter Entrance Stairs-Rails: None Entrance Stairs-Number of Steps: 3 + 3 Discharge Bathroom Shower/Tub: Tub/shower unit, Walk-in  shower Discharge Bathroom Toilet: Handicapped height Discharge Bathroom Accessibility: Yes How Accessible: Accessible via walker Does the patient have any problems obtaining your medications?: No   Social/Family/Support Systems Contact Information: spouse, Higher education careers adviser Anticipated Caregiver: spouse and hired caregivers Anticipated Ambulance person Information: see contacts Ability/Limitations of Caregiver: spouse works but can take some time off / 2 weeks Caregiver Availability: 24/7 Discharge Plan Discussed with Primary Caregiver: Yes Is Caregiver In Agreement with Plan?: Yes Does Caregiver/Family have Issues with Lodging/Transportation while Pt is in Rehab?: No   Goals Patient/Family Goal for Rehab: supervision to min at wheelchair level with PT And OT Expected length of stay: ELOS 5 to 7 days Pt/Family Agrees to Admission and willing to participate: Yes Program Orientation Provided & Reviewed with Pt/Caregiver Including Roles  & Responsibilities: Yes   Decrease burden of Care through IP rehab admission: n/a   Possible need for SNF placement upon discharge: not anticipated   Patient Condition: I have reviewed medical records from [facility] , spoken with CM, and patient and spouse. I met with patient at the bedside for inpatient rehabilitation assessment.  Patient will benefit from ongoing PT and OT, can actively participate in 3 hours of therapy a day 5 days of the week, and can make measurable gains during the admission.  Patient will also benefit from the coordinated team approach during an Inpatient Acute Rehabilitation admission.  The patient will receive intensive therapy as well as Rehabilitation physician, nursing, social worker, and care management interventions.  Due to bladder management, bowel management, safety, skin/wound care, disease management, medication administration, pain management, and patient education the patient requires 24 hour a day rehabilitation nursing.   The patient is currently min to mod assist overall with mobility and basic ADLs.  Discharge setting and therapy post discharge at home with home health is anticipated.  Patient has agreed to participate in the Acute Inpatient Rehabilitation Program and will admit today.   Preadmission Screen Completed By:  Cleatrice Burke, 07/27/2021 12:18 PM ______________________________________________________________________   Discussed status with Dr. Naaman Plummer  on  07/27/2021 at 1234 and received approval for admission today.   Admission Coordinator:  Cleatrice Burke, RN, time  2263 Date  07/27/2021    Assessment/Plan: Diagnosis: poytrauma Does the need for close, 24 hr/day Medical supervision in concert with the patient's rehab needs make it unreasonable for this patient to be served in a  less intensive setting? Yes Co-Morbidities requiring supervision/potential complications: anxiety disorder, pain mgt, wound care Due to bladder management, bowel management, safety, skin/wound care, disease management, medication administration, and pain management, does the patient require 24 hr/day rehab nursing? Yes Does the patient require coordinated care of a physician, rehab nurse, PT, OT, and SLP to address physical and functional deficits in the context of the above medical diagnosis(es)? Yes Addressing deficits in the following areas: balance, endurance, locomotion, strength, transferring, bowel/bladder control, bathing, dressing, feeding, grooming, toileting, and psychosocial support Can the patient actively participate in an intensive therapy program of at least 3 hrs of therapy 5 days a week? Yes The potential for patient to make measurable gains while on inpatient rehab is excellent Anticipated functional outcomes upon discharge from inpatient rehab: supervision and min assist PT, supervision and min assist OT, n/a SLP at w/c level Estimated rehab length of stay to reach the above functional goals  is: 5-7 days Anticipated discharge destination: Home 10. Overall Rehab/Functional Prognosis: excellent     MD Signature: [name], MD, [place] Physical Medicine & Rehabilitation 07/27/2021          Revision History                     Note Details  Author [name], MD File Time 07/27/2021 12:57 PM  Author Type Physician Status Signed  Last Editor [name], MD Service Physical Medicine and Rehabilitation

## 2021-07-27 NOTE — Progress Notes (Addendum)
Inpatient Rehabilitation Admissions Coordinator   I have insurance approval and CIR bed to admit her to today. I met with patient with her spouse at bedside. They are in agreement. I contacted [place] PA for [place] and TOC. I await Chrys Racer PA to return call to arrange final discharge.  Danne Baxter, RN, MSN Rehab Admissions Coordinator [phone] 07/27/2021 11:38 AM  [place] PA aware and in agreement to discharge to CIR today. I will make the arrangements.  Danne Baxter, RN, MSN Rehab Admissions Coordinator [phone] 07/27/2021 12:10 PM

## 2021-07-27 NOTE — Progress Notes (Signed)
ANTICOAGULATION CONSULT NOTE - Initial Consult  Pharmacy Consult for Rivaroxaban Indication: VTE prophylaxis  Allergies  Allergen Reactions   Contrast Media [Iodinated Diagnostic Agents] Hives   Isovue [Iopamidol] Hives    After returning home pt called to inform technologist of rash to face and neck, denies difficulty breathing or swallowing, pt took 2 benedryl Will need premeds in future   Amoxicillin Other (See Comments)    Headache  Cephalosporin tolerant 07/23/2021   Belviq Xr [Lorcaserin Hcl Er] Itching    Caused her face to turn red.   Saxenda [Liraglutide -Weight Management] Other (See Comments)    Caused indigestion.   Penicillins Rash    Cephalosporin Tolerant 07/23/2021     Patient Measurements: Height: 5\' 7"  (170.2 cm) Weight: 80.7 kg (177 lb 14.6 oz) IBW/kg (Calculated) : 61.6   Vital Signs: Temp: 98.2 F (36.8 C) (12/02 1138) Temp Source: Oral (12/02 1138) BP: 110/66 (12/02 1138) Pulse Rate: 81 (12/02 1138)  Labs: Recent Labs    07/25/21 2008 07/26/21 0837 07/27/21 0552  HGB 11.4* 10.4*  --   HCT 34.0* 31.0*  --   PLT 291 267  --   CREATININE 0.85 0.93 0.92    Estimated Creatinine Clearance: 65.7 mL/min (by C-G formula based on SCr of 0.92 mg/dL).   Medical History: Past Medical History:  Diagnosis Date   Anxiety    Asthma    Hyperlipidemia    Hypertension    Plantar fasciitis    Thyroid disease     Medications:  Scheduled:   vitamin C  500 mg Oral Daily   calcium citrate  200 mg of elemental calcium Oral BID   cholecalciferol  2,000 Units Oral BID   docusate sodium  100 mg Oral BID   gabapentin  100 mg Oral TID   levothyroxine  100 mcg Oral Q0600   methocarbamol  750 mg Oral TID   naproxen  250 mg Oral BID WC   pravastatin  20 mg Oral Daily   [START ON 07/28/2021] rivaroxaban  10 mg Oral Daily    Assessment: Patient s/p ORIF L ankle (day 4) on lovenox 40 mg [place] daily for VTE prophyalxis. Consult received from orthopedics  14/10/2020 PA) to start Xarelto (rivaroxaban) for VTE prophylaxis.  Goal of Therapy:  VTE prophylaxis with Xarelto 10 mg po daily     Plan:  Start Xarelto 10 mg po daily on 07/28/2021. Patient received prophylaxis dose of Lovenox 40 mg SQ on 07/27/2021.  Monitor Hemoglobin and serum creatinine. Pharmacy will sign off on consult and monitor therapy for the duration of her stay.  Thank you  Chrystine Frogge BS, PharmD, BCPS Clinical Pharmacist 07/27/2021,11:55 AM

## 2021-07-27 NOTE — Progress Notes (Signed)
PT Cancellation Note  Patient Details Name: Kathryn Boyer MRN: [id number] DOB: March 22, 1956   Cancelled Treatment:    Reason Eval/Treat Not Completed: Other (comment) (REhab doctor came into room when PT getting started.  Will return as able.)   Bevelyn Buckles 07/27/2021, 2:03 PM Scottlyn Mchaney M,PT Acute Rehab Services [phone] (pager)

## 2021-07-27 NOTE — Progress Notes (Signed)
Occupational Therapy Treatment Patient Details Name: Kathryn Boyer MRN: [id number] DOB: 1956-05-02 Today's Date: 07/27/2021   History of present illness 65 y/o female who presents 11/25 to [facility] with multiple fxs s/p fall - left humerus fx, right foot/lateral malleolus fx and left tibial plateau fx. Transfer to [facility] for surgical intervention. s/p ORIF LLE 11/28. S/p left shoulder replacement on 11/30. PMH includes osteoporosis, HTN, anxiety.   OT comments  Patient received in bed and was upset because she wanted to be cleaned up and transfer to recliner.  Patient was assisted with bathing at bed level for LB with patient performing peri area cleaning and RLE. Patient transitioned to EOB and performed UB bathing and dressing with mod assist.  Transfer performed to recliner to right with min assist.  AROM exercises performed to LUE elbow, wrist, and hand with patient demonstrating good recall of exercises. Acute OT to continue to follow.    Recommendations for follow up therapy are one component of a multi-disciplinary discharge planning process, led by the attending physician.  Recommendations may be updated based on patient status, additional functional criteria and insurance authorization.    Follow Up Recommendations  Acute inpatient rehab (3hours/day)    Assistance Recommended at Discharge Frequent or constant Supervision/Assistance  Equipment Recommendations  BSC/3in1    Recommendations for Other Services      Precautions / Restrictions Precautions Precautions: Fall;Shoulder Type of Shoulder Precautions: Conservative protocal: no shoulder PROM/AROM. Cleared for ROM of hand, wrist, and elbow. Shoulder Interventions: Shoulder sling/immobilizer;At all times;Off for dressing/bathing/exercises Precaution Booklet Issued: Yes (comment) Precaution Comments: Reviewed shoulder precautions Required Braces or Orthoses: Sling;Other Brace Other Brace: post op shoe RLE, WBAT Restrictions Weight  Bearing Restrictions: Yes LUE Weight Bearing: Non weight bearing RLE Weight Bearing: Weight bearing as tolerated LLE Weight Bearing: Weight bearing as tolerated       Mobility Bed Mobility Overal bed mobility: Needs Assistance Bed Mobility: Sit to Supine     Supine to sit: HOB elevated;Min assist     General bed mobility comments: required assistance to adjust hips and LLE    Transfers Overall transfer level: Needs assistance Equipment used: None Transfers: Bed to chair/wheelchair/BSC            Lateral/Scoot Transfers: Min assist General transfer comment: performed with verbal cues and min assist with LLE     Balance Overall balance assessment: Needs assistance Sitting-balance support: Feet supported;No upper extremity supported Sitting balance-Leahy Scale: Good                                     ADL either performed or assessed with clinical judgement   ADL Overall ADL's : Needs assistance/impaired     Grooming: Wash/dry face;Bed level;Set up   Upper Body Bathing: Moderate assistance;Sitting Upper Body Bathing Details (indicate cue type and reason): required assistance with under arms and back Lower Body Bathing: Moderate assistance;Bed level Lower Body Bathing Details (indicate cue type and reason): patient performed perineal cleaning at bed level Upper Body Dressing : Moderate assistance;Sitting Upper Body Dressing Details (indicate cue type and reason): changed gown and readjusted sling                   General ADL Comments: LB bathing performed at bed level and UB seated on EOB    Extremity/Trunk Assessment Upper Extremity Assessment LUE Deficits / Details: No shoulder ROM. Cleared hand, wrist, and elbow LUE Coordination:  decreased gross motor;decreased fine motor            Vision       Perception     Praxis      Cognition Arousal/Alertness: Awake/alert Behavior During Therapy: WFL for tasks  assessed/performed Overall Cognitive Status: Within Functional Limits for tasks assessed                                 General Comments: upset upon arrival due to patient wanting to be assisted with cleaning and get into recliner          Exercises Exercises: Shoulder Shoulder Exercises Elbow Flexion: AROM;Left;10 reps;Seated Elbow Extension: AROM;Left;10 reps;Seated Wrist Flexion: AROM;Left;10 reps;Seated Wrist Extension: AROM;Left;10 reps;Seated Digit Composite Flexion: AROM;Left;10 reps;Seated Composite Extension: AROM;Left;10 reps;Seated   Shoulder Instructions       General Comments      Pertinent Vitals/ Pain       Pain Assessment: Faces Faces Pain Scale: Hurts even more Pain Location: left shoulder, right foot, LLE Pain Descriptors / Indicators: Aching;Discomfort;Grimacing;Operative site guarding Pain Intervention(s): Premedicated before session;Monitored during session;Repositioned  Home Living                                          Prior Functioning/Environment              Frequency  Min 3X/week        Progress Toward Goals  OT Goals(current goals can now be found in the care plan section)  Progress towards OT goals: Progressing toward goals  Acute Rehab OT Goals Patient Stated Goal: get more active OT Goal Formulation: With patient Time For Goal Achievement: 08/07/21 Potential to Achieve Goals: Good ADL Goals Pt Will Perform Grooming: with set-up;sitting Pt Will Perform Upper Body Dressing: with min assist;sitting Pt Will Perform Lower Body Dressing: with min assist;with adaptive equipment Pt Will Transfer to Toilet: with min guard assist;bedside commode Pt Will Perform Toileting - Clothing Manipulation and hygiene: with min guard assist;sitting/lateral leans Pt/caregiver will Perform Home Exercise Program: Increased strength;Left upper extremity;Increased ROM;Independently;With written HEP provided  Plan  Discharge plan remains appropriate    Co-evaluation                 AM-PAC OT "6 Clicks" Daily Activity     Outcome Measure   Help from another person eating meals?: A Little Help from another person taking care of personal grooming?: A Little Help from another person toileting, which includes using toliet, bedpan, or urinal?: A Lot Help from another person bathing (including washing, rinsing, drying)?: A Lot Help from another person to put on and taking off regular upper body clothing?: A Lot Help from another person to put on and taking off regular lower body clothing?: A Lot 6 Click Score: 14    End of Session Equipment Utilized During Treatment: Gait belt  OT Visit Diagnosis: Unsteadiness on feet (R26.81);Other abnormalities of gait and mobility (R26.89);Muscle weakness (generalized) (M62.81);Pain Pain - Right/Left: Left Pain - part of body: Shoulder;Leg   Activity Tolerance Patient limited by pain   Patient Left in chair;with call bell/phone within reach   Nurse Communication Mobility status        Time: 3762-8315 OT Time Calculation (min): 35 min  Charges: OT General Charges $OT Visit: 1 Visit OT Treatments $Self Care/Home Management : 8-22  mins $Therapeutic Exercise: 8-22 mins  Alfonse Flavors, OTA Acute Rehabilitation Services  Pager [phone] Office [phone]   Dewain Penning 07/27/2021, 9:37 AM

## 2021-07-28 ENCOUNTER — Encounter (HOSPITAL_COMMUNITY): Payer: Self-pay | Admitting: Orthopaedic Surgery

## 2021-07-28 DIAGNOSIS — G8918 Other acute postprocedural pain: Secondary | ICD-10-CM

## 2021-07-28 DIAGNOSIS — Z8679 Personal history of other diseases of the circulatory system: Secondary | ICD-10-CM

## 2021-07-28 DIAGNOSIS — D62 Acute posthemorrhagic anemia: Secondary | ICD-10-CM

## 2021-07-28 DIAGNOSIS — E039 Hypothyroidism, unspecified: Secondary | ICD-10-CM

## 2021-07-28 DIAGNOSIS — K5903 Drug induced constipation: Secondary | ICD-10-CM

## 2021-07-28 MED ORDER — CALCIUM CITRATE 950 (200 CA) MG PO TABS
200.0000 mg | ORAL_TABLET | Freq: Two times a day (BID) | ORAL | Status: DC
  Administered 2021-07-28 – 2021-08-01 (×8): 200 mg via ORAL
  Filled 2021-07-28 (×13): qty 1

## 2021-07-28 MED ORDER — POLYETHYLENE GLYCOL 3350 17 G PO PACK
17.0000 g | PACK | Freq: Two times a day (BID) | ORAL | Status: DC
  Administered 2021-07-28 – 2021-08-01 (×6): 17 g via ORAL
  Filled 2021-07-28 (×10): qty 1

## 2021-07-28 NOTE — Progress Notes (Signed)
Physical Therapy Session Note  Patient Details  Name: Kathryn Boyer MRN: [id number] Date of Birth: [birth year]  Today's Date: 07/28/2021 PT Individual Time:  -      Short Term Goals: Week 1:  PT Short Term Goal 1 (Week 1): STG = LTG d/t short ELOS  Skilled Therapeutic Interventions/Progress Updates:  Pt received supine pt reports need for BM after taking stool softener, but requesting female assistance to toilet. NT made aware. Will re-attempt therapy at different time/date.    Therapy Documentation Precautions:  Precautions Precautions: Fall, Shoulder Type of Shoulder Precautions: Conservative shoulder protocol: no PROM/AROM. Cleared for ROM of L hand, wrist, and elbow. Shoulder Interventions: At all times, Off for dressing/bathing/exercises, Don joy ultra sling, Shoulder abduction pillow Precaution Comments: Reviewed shoulder precautions Required Braces or Orthoses: Other Brace, Splint/Cast Splint/Cast: LLE for 2 weeks then CAM boot, NWB for 8 weeks Other Brace: post op shoe RLE, WBAT Restrictions Weight Bearing Restrictions: Yes LUE Weight Bearing: Non weight bearing RLE Weight Bearing: Weight bearing as tolerated LLE Weight Bearing: Non weight bearing Other Position/Activity Restrictions: No restrictions knee ROM on left General: PT Amount of Missed Time (min): 60 Minutes PT Missed Treatment Reason: Bowel/bladder accident Vital Signs: Therapy Vitals Temp: 98.4 F (36.9 C) Temp Source: Oral Pulse Rate: 83 Resp: 16 BP: 112/64 Patient Position (if appropriate): Lying Oxygen Therapy SpO2: 96 % O2 Device: Room Air    Therapy/Group: Individual Therapy  Golden Pop 07/28/2021, 4:23 PM

## 2021-07-28 NOTE — Progress Notes (Signed)
[facility] PHYSICAL MEDICINE & REHABILITATION PROGRESS NOTE  Subjective/Complaints: Patient seen sitting up in bed this morning.  She states she slept fairly well overnight.  She states she is ready begin therapies.  She does note constipation.  ROS: Denies CP, SOB, N/V/D  Objective: Vital Signs: Blood pressure 108/70, pulse 75, temperature 98.2 F (36.8 C), resp. rate 18, height 5\' 7"  (1.702 m), weight 86.9 kg, SpO2 95 %. No results found. Recent Labs    07/25/21 2008 07/26/21 0837  WBC 9.6 9.4  HGB 11.4* 10.4*  HCT 34.0* 31.0*  PLT 291 267   Recent Labs    07/26/21 0837 07/27/21 0552  NA 139  --   K 3.7  --   CL 103  --   CO2 27  --   GLUCOSE 104*  --   BUN 11  --   CREATININE 0.93 0.92  CALCIUM 8.6*  --     Intake/Output Summary (Last 24 hours) at 07/28/2021 1212 Last data filed at 07/28/2021 1130 Gross per 24 hour  Intake 236 ml  Output 975 ml  Net -739 ml        Physical Exam: BP 108/70   Pulse 75   Temp 98.2 F (36.8 C)   Resp 18   Ht 5\' 7"  (1.702 m)   Wt 86.9 kg   SpO2 95%   BMI 30.01 kg/m  Constitutional: No distress . Vital signs reviewed. HENT: Normocephalic.  Atraumatic. Eyes: EOMI. No discharge. Cardiovascular: No JVD.  RRR. Respiratory: Normal effort.  No stridor.  Bilateral clear to auscultation. GI: Non-distended.  BS +. Skin: Warm and dry.  Scattered bruising.  Left lower extremity and splint CDI Psych: Normal mood.  Normal behavior. Musc: Left upper extremity in sling.  Edema noted. Left lower extremity in splint with edema Right upper extremity with edema and tenderness Neuro: Alert Able to grip with left hand and move elbow within sling.  RUE 5/5.  RUE 3-4/5 prox to distal, limited more at ankle and foot by pain and swelling.  Able to move left leg side to side, wiggles toes.  Sensation diminished to light touch  Assessment/Plan: 1. Functional deficits which require 3+ hours per day of interdisciplinary therapy in a  comprehensive inpatient rehab setting. Physiatrist is providing close team supervision and 24 hour management of active medical problems listed below. Physiatrist and rehab team continue to assess barriers to discharge/monitor patient progress toward functional and medical goals   Care Tool:  Bathing              Bathing assist       Upper Body Dressing/Undressing Upper body dressing        Upper body assist      Lower Body Dressing/Undressing Lower body dressing            Lower body assist       Toileting Toileting    Toileting assist Assist for toileting: Maximal Assistance - Patient 25 - 49%     Transfers Chair/bed transfer  Transfers assist           Locomotion Ambulation   Ambulation assist              Walk 10 feet activity   Assist           Walk 50 feet activity   Assist           Walk 150 feet activity   Assist  Walk 10 feet on uneven surface  activity   Assist           Wheelchair     Assist               Wheelchair 50 feet with 2 turns activity    Assist            Wheelchair 150 feet activity     Assist           Medical Problem List and Plan: 1. Functional deficits secondary to polytrauma including left tibial plateau fx and left trimalleolar fx with syndemosis rupture as well as left proximal humerus fx which required reverse TSA and right distal fib fx and 5th MT fx             -NWB LLE x 8 weeks--convert to CAM in 2 weeks             -NWB LUE-may use left arm for ADL's at waist level             -WBAT RLE with post-op shoe for transfers Begin CIR evaluations  2.  Antithrombotics: -DVT/anticoagulation:  Xarelto 10mg  daily             -antiplatelet therapy: n/a 3. Pain Management: oxycodone prn for moderate to severe pain,               -tylenol prn for mild pain             -robaxin 750mg  tid for muscle spasms             -naproxen 250mg  bid with  meals             -gabapentin 100mg  tid             -ice prn  Monitor with increased exertion 4. Mood/generalized anxiety disorder             -team to provide ego support as necessary             -antipsychotic agents: n/a             -transition to xanax which she was using PRN at home (being given valium on acute)             -trazodone prn for sleep 5. Neuropsych: This patient is capable of making decisions on her own behalf. 6. Skin/Wound Care: continue post-op dressing to left shoulder.              -pt to remain in splint LLE for 2 weeks with transition to CAM boot 7. Fluids/Electrolytes/Nutrition: encourage appropriate PO 8.  Drug-induced constipation             Scheduled miralax and senna-s 2 tabs at bedtime  Bowel meds increased on 12/30 9. Hypothyroidism             -Synthroid daily 10. Bones/hx of osteoporosis: calcium and vitamin d daily             -updated DEXA and further rx as outpt              -pt was on actonel PTA 11. Hyperlipidemia: pt on pravastatin and olmesartan at home             -continue pravastatin for now per acute regimen 12. Hx of HTN: controlled without meds currently  Monitor the increased mobility 13. Bladder: oob to void when possible. Can use purewick if needed at HS initially 14.  Acute blood loss anemia  Hemoglobin 10.4 on 12/1, labs ordered for Monday  LOS: 1 days A FACE TO FACE EVALUATION WAS PERFORMED  Kathryn Boyer 07/28/2021, 12:12 PM

## 2021-07-28 NOTE — Plan of Care (Signed)
  Problem: RH Grooming Goal: LTG Patient will perform grooming w/assist,cues/equip (OT) Description: LTG: Patient will perform grooming with assist, with/without cues using equipment (OT) Flowsheets (Taken 07/28/2021 1313) LTG: Pt will perform grooming with assistance level of: Minimal Assistance - Patient > 75%   Problem: RH Dressing Goal: LTG Patient will perform upper body dressing (OT) Description: LTG Patient will perform upper body dressing with assist, with/without cues (OT). Flowsheets (Taken 07/28/2021 1313) LTG: Pt will perform upper body dressing with assistance level of: (excluding sling) Minimal Assistance - Patient > 75%   Problem: RH Toilet Transfers Goal: LTG Patient will perform toilet transfers w/assist (OT) Description: LTG: Patient will perform toilet transfers with assist, with/without cues using equipment (OT) Flowsheets (Taken 07/28/2021 1313) LTG: Pt will perform toilet transfers with assistance level of: Contact Guard/Touching assist

## 2021-07-28 NOTE — Evaluation (Addendum)
Physical Therapy Assessment and Plan  Patient Details  Name: Kathryn Boyer MRN: [id number] Date of Birth: 1955/10/09  PT Diagnosis: Difficulty walking, Edema, Impaired sensation, Muscle weakness, Pain in joint, and Pain in surgical incisions. Rehab Potential: Good ELOS: 7 days   Today's Date: 07/28/2021 PT Individual Time: 0803-0930 PT Individual Time Calculation (min): 87 min    Hospital Problem: Principal Problem:   Critical polytrauma Active Problems:   Acute blood loss anemia   History of hypertension   Drug induced constipation   Postoperative pain   Past Medical History:  Past Medical History:  Diagnosis Date   Anxiety    Asthma    Hyperlipidemia    Hypertension    Plantar fasciitis    Thyroid disease    Past Surgical History:  Past Surgical History:  Procedure Laterality Date   ABDOMINAL HYSTERECTOMY     BREAST SURGERY     NOSE SURGERY     ORIF ANKLE FRACTURE Left 07/23/2021   Procedure: OPEN REDUCTION INTERNAL FIXATION (ORIF) ANKLE FRACTURE;  Surgeon: Altamese Bellflower, MD;  Location: [place];  Service: Orthopedics;  Laterality: Left;    Assessment & Plan Clinical Impression: Patient is a 65 y.o. white female who was walking inside of her house on November 25 going down to a landing where she misplaced her foot landing on her left shoulder and twisting her left ankle violently.  She immediately complained of pain in both feet and ankles as well as her left shoulder.  There is no loss of consciousness.  Patient suffered a left depressed medial tibial plateau fracture, a left trimalleolar ankle fracture/dislocation and ruptured syndesmosis, left proximal humerus fracture, right nondisplaced lateral malleolus fracture, right fifth metatarsal base fracture.  Patient initially was seen at [facility] but then given the complexity of the fractures was sent to [facility] for further care.  On 11/28 patient underwent open reduction internal fixation of left  bimalleolar ankle fracture, open reduction internal fixation of syndesmosis, closed treatment of left medial condyle fracture of the tibia by Dr. Marcelino Scot.  It was felt that the left split proximal humerus fracture was unreconstructable. Dr. Griffin Basil was consulted and the patient underwent a left reverse total shoulder arthroplasty on 07/25/2021.  Patient is nonweightbearing left upper extremity, okay to use arm for ADLs at waist level, sling at all times except for ADLs and waist level exercises.  Patient is nonweightbearing for 8 weeks left lower extremity in splint.  Plan will to be convert patient to cam boot in about 2 weeks.  Patient is weightbearing as tolerated right lower extremity in postop shoe.  Patient was placed on Lovenox 40 mg every 24 hours for DVT prophylaxis.  Patient reports not much appetite.  She has not moved her bowels since coming to the hospital.  Medications for pain have been effective for controlling her pain although they do make her a bit sleepy.  Patient transferred to CIR on 07/27/2021 .   Patient currently requires min with mobility secondary to muscle weakness, decreased cardiorespiratoy endurance, decreased problem solving and decreased safety awareness, and decreased standing balance, decreased balance strategies, and difficulty maintaining precautions.  Prior to hospitalization, patient was independent  with mobility and lived with Spouse in a House home.  Home access is 4, landing then 4 more (pt reports that she will purchase 2 ramps to access home)Stairs to enter.  Patient will benefit from skilled PT intervention to maximize safe functional mobility, minimize fall risk, and decrease caregiver burden  for planned discharge home with 24 hour assist.  Anticipate patient will benefit from follow up [place] at discharge.  PT - End of Session Activity Tolerance: Tolerates 30+ min activity with multiple rests Endurance Deficit: Yes Endurance Deficit Description: Pt only able to  tolerate EOB/OOB activity for <10 minutes during eval PT Assessment Rehab Potential (ACUTE/IP ONLY): Good PT Barriers to Discharge: [place] home environment;Decreased caregiver support;Home environment access/layout;Incontinence;Wound Care;Lack of/limited family support;Weight bearing restrictions PT Patient demonstrates impairments in the following area(s): Balance;Edema;Endurance;Pain;Perception;Safety;Sensory;Skin Integrity PT Transfers Functional Problem(s): Bed Mobility;Bed to Chair;Car;Furniture PT Locomotion Functional Problem(s): Ambulation;Wheelchair Mobility;Stairs PT Plan PT Intensity: Minimum of 1-2 x/day ,45 to 90 minutes PT Frequency: 5 out of 7 days PT Duration Estimated Length of Stay: 7 days PT Treatment/Interventions: Financial risk analyst;Neuromuscular re-education;Psychosocial support;UE/LE Strength taining/ROM;Wheelchair propulsion/positioning;UE/LE Coordination activities;Therapeutic Activities;Skin care/wound management;Pain management;Functional electrical stimulation;Discharge planning;Balance/vestibular training;Cognitive remediation/compensation;Disease management/prevention;Functional mobility training;Patient/family education;Splinting/orthotics;Therapeutic Exercise;Visual/perceptual remediation/compensation PT Transfers Anticipated Outcome(s): CGA/ supervision PT Locomotion Anticipated Outcome(s): supervision in w/c PT Recommendation Recommendations for Other Services: Therapeutic Recreation consult Therapeutic Recreation Interventions: Clinical cytogeneticist;Outing/community reintergration Follow Up Recommendations: Home health PT Patient destination: Home Equipment Recommended: 3 in 1 bedside comode;Sliding board   PT Evaluation Precautions/Restrictions Precautions Precautions: Fall;Shoulder Type of Shoulder Precautions: Conservative shoulder protocol: no PROM/AROM. Cleared for ROM of L hand, wrist, and  elbow. Shoulder Interventions: At all times;Off for dressing/bathing/exercises;Don joy ultra sling;Shoulder abduction pillow Precaution Comments: Reviewed shoulder precautions Required Braces or Orthoses: Other Brace;Splint/Cast Splint/Cast: LLE for 2 weeks then CAM boot, NWB for 8 weeks Other Brace: post op shoe RLE, WBAT Restrictions Weight Bearing Restrictions: Yes LUE Weight Bearing: Non weight bearing RLE Weight Bearing: Weight bearing as tolerated LLE Weight Bearing: Non weight bearing Other Position/Activity Restrictions: No restrictions knee ROM on left General   Vital Signs  Pain Pain Assessment Pain Scale: 0-10 Pain Score: 5  Pain Type: Acute pain;Surgical pain Pain Location: Foot Pain Orientation: Left Pain Descriptors / Indicators: Aching;Burning;Throbbing Pain Onset: On-going Patients Stated Pain Goal: 2 Pain Intervention(s): Repositioned 2nd Pain Site Pain Score: 5 Pain Type: Acute pain;Surgical pain Pain Location: Shoulder Pain Orientation: Left Pain Descriptors / Indicators: Aching Pain Frequency: Constant Pain Onset: On-going Patient's Stated Pain Goal: 0 Pain Intervention(s): Repositioned (sling repositioned) Pain Interference Pain Interference Pain Effect on Sleep: 3. Frequently Pain Interference with Therapy Activities: 2. Occasionally Pain Interference with Day-to-Day Activities: 3. Frequently Home Living/Prior Functioning Home Living Available Help at Discharge: Family Type of Home: House Home Access: Stairs to enter [employer] of Steps: 4, landing then 4 more (pt reports that she will purchase 2 ramps to access home) Entrance Stairs-Rails: None Home Layout: Two level;Able to live on main level with bedroom/bathroom Bathroom Shower/Tub: Tub/shower unit;Walk-in shower Bathroom Toilet: Handicapped height Bathroom Accessibility: Yes Additional Comments: she "thinks" that it may also be wheelchair accessible however unsure if w/c can  fit closely enough to the toilet for slideboard/squat pivot transfers  Lives With: Spouse Prior Function Level of Independence: Independent with basic ADLs;Independent with homemaking with ambulation  Able to Take Stairs?: Reciprically Driving: Yes Leisure: Hobbies-yes (Comment) Vision/Perception  Vision - History Ability to See in Adequate Light: 0 Adequate Perception Perception: Within Functional Limits Praxis Praxis: Intact  Cognition Overall Cognitive Status: Within Functional Limits for tasks assessed Arousal/Alertness: Awake/alert Orientation Level: Oriented X4 Year: 2022 Month: December Day of Week: Correct Attention: Focused Memory: Appears intact Immediate Memory Recall: Sock;Blue;Bed Memory Recall Sock: Without Cue Memory Recall Blue: Without Cue Memory Recall Bed: Without Cue Awareness:  Appears intact Problem Solving: Appears intact Safety/Judgment: Appears intact Sensation Sensation Light Touch: Impaired by gross assessment (light touch to toes on LLE diminished but present) Coordination Gross Motor Movements are Fluid and Coordinated: No Fine Motor Movements are Fluid and Coordinated: Yes Coordination and Movement Description: Affected by swelling and multiple movement restrictions due to fractures/medical precautions Finger Nose Finger Test: WNL Rt, unable on the Lt due to precautions Heel Shin Test: [facility] with exception for pain in dependent positioning Motor  Motor Motor: Other (comment) Motor - Skilled Clinical Observations: restricted due to WB precautions, swelling, and pain during functional activity/movement   Trunk/Postural Assessment  Cervical Assessment Cervical Assessment: Exceptions to [facility] (forward head) Thoracic Assessment Thoracic Assessment: Exceptions to [facility] (mild rounded shoulders) Lumbar Assessment Lumbar Assessment: Within Functional Limits Postural Control Postural Control: Within Functional Limits  Balance Balance Balance  Assessed: Yes Static Sitting Balance Static Sitting - Balance Support: No upper extremity supported;Feet supported (RLE supported) Static Sitting - Level of Assistance: 5: Stand by assistance Dynamic Sitting Balance Dynamic Sitting - Balance Support: During functional activity Dynamic Sitting - Level of Assistance: 4: Min assist (slideboard<BSC) Dynamic Sitting - Balance Activities: Lateral lean/weight shifting;Forward lean/weight shifting Extremity Assessment  RUE Assessment RUE Assessment: Within Functional Limits Active Range of Motion (AROM) Comments: WNL LUE Assessment LUE Assessment: Exceptions to WFL (+edema, NWB shoulder, ROM of elbow, wrist, hand ok per MD order) RLE Assessment RLE Assessment: Exceptions to [facility] RLE Strength Right Hip Flexion: 4/5 Right Hip Extension: 4/5 Right Hip ABduction: 4+/5 Right Hip ADduction: 4/5 Right Knee Flexion: 4/5 Right Knee Extension: 4/5 Right Ankle Dorsiflexion: 3+/5 (pain) Right Ankle Plantar Flexion: 3+/5 (pain) LLE Assessment LLE Assessment: Exceptions to [facility] LLE Strength Left Hip Flexion: 3-/5 Left Hip Extension: 3-/5 Left Hip ABduction: 4-/5 Left Hip ADduction: 4-/5 Left Knee Flexion: 4-/5 Left Knee Extension: 4-/5  Care Tool Care Tool Bed Mobility Roll left and right activity   Roll left and right assist level: Minimal Assistance - Patient > 75%    Sit to lying activity   Sit to lying assist level: Minimal Assistance - Patient > 75%    Lying to sitting on side of bed activity   Lying to sitting on side of bed assist level: the ability to move from lying on the back to sitting on the side of the bed with no back support.: Contact Guard/Touching assist     Care Tool Transfers Sit to stand transfer Sit to stand activity did not occur: Safety/medical concerns      Chair/bed transfer   Chair/bed transfer assist level: Minimal Assistance - Patient > 75%     Toilet transfer   Assist Level: Minimal Assistance - Patient >  75%    Scientist, product/process development transfer activity did not occur: Safety/medical concerns        Care Tool Locomotion Ambulation Ambulation activity did not occur: Safety/medical concerns        Walk 10 feet activity Walk 10 feet activity did not occur: Safety/medical concerns       Walk 50 feet with 2 turns activity Walk 50 feet with 2 turns activity did not occur: Safety/medical concerns      Walk 150 feet activity Walk 150 feet activity did not occur: Safety/medical concerns      Walk 10 feet on uneven surfaces activity Walk 10 feet on uneven surfaces activity did not occur: Safety/medical concerns      Stairs Stair activity did not occur: Safety/medical concerns  Walk up/down 1 step activity Walk up/down 1 step or curb (drop down) activity did not occur: Safety/medical concerns      Walk up/down 4 steps activity Walk up/down 4 steps activity did not occur: Safety/medical concerns      Walk up/down 12 steps activity Walk up/down 12 steps activity did not occur: Safety/medical concerns      Pick up small objects from floor Pick up small object from the floor (from standing position) activity did not occur: Safety/medical concerns      Wheelchair Is the patient using a wheelchair?: Yes Type of Wheelchair: Manual   Wheelchair assist level: Total Assistance - Patient < 25% Max wheelchair distance: 20 ft  Wheel 50 feet with 2 turns activity   Assist Level: Total Assistance - Patient < 25%  Wheel 150 feet activity   Assist Level: Total Assistance - Patient < 25%    Refer to Care Plan for Long Term Goals  SHORT TERM GOAL WEEK 1 PT Short Term Goal 1 (Week 1): STG = LTG d/t short ELOS  Recommendations for other services: Surveyor, mining group, Stress management, and Outing/community reintegration  Skilled Therapeutic Intervention Mobility Bed Mobility Bed Mobility: Rolling Right;Rolling Left;Supine to Sit;Sit to Supine;Scooting to [facility] Rolling  Right: Contact Guard/Touching assist Rolling Left: Minimal Assistance - Patient > 75% Supine to Sit: Minimal Assistance - Patient > 75%;Contact Guard/Touching assist Sit to Supine: Contact Guard/Touching assist Scooting to HOB: Minimal Assistance - Patient > 75% Transfers Transfers: Public house manager;Lateral/Scoot Transfers Squat Pivot Transfers: Minimal Assistance - Patient > 75% Lateral/Scoot Transfers: Contact Guard/Touching assist Transfer (Assistive device): None Locomotion  Gait Ambulation: No Gait Gait: No  PT Evaluation completed; see above for results. PT educated patient in roles of PT vs OT, PT POC, rehab potential, rehab goals, and discharge recommendations along with recommendation for follow-up rehabilitation services. Considerable time spent discussing numerous education topics. Individual treatment initiated:  Patient supine in bed upon PT arrival. Patient alert and agreeable to PT session. Pain complaint during session at LLE and L shoulder. Pt was medicated at start of session. Pt relates some of recent PMH including issues with urinary incontinence for which she has undergone Josph Macho laser procedure. Unfortunately, with numerous times under anesthesia for recent emergent and unexpected surgeries, pt is very incontinent now and currently using Purewick. Education given re: side effects of anesthesia, but that with recent procedure, pt will need to discontinue purewick soon and focus on pelvic floor muscle exercises to encourage recovery from anesthesia side effects. She is also educated on the potential need for OP pelvic health PT once she is healed and/ or progressed to point of being able to reach OP clinics.   Therapeutic Activity: Bed Mobility: Patient performed supine to/from sit with CGA. Provided verbal cues for technique and maintaining WB precautions.  Transfers: Patient performed lateral scoot along bedside with CGA/ intermittent MinA. Squat pivot to/ from w/c  with MinA to clear wheel with pt unable to provide lift without pressure into LLE. Described use of slide board and educated that OT will educate on slide board use later in the morning. Provided vc/tc for technique throughout.  Demonstrated w/c parts and how to remove armrests and legrests. Pt's pain level prevents her from return demonstration but relates that she will be able to learn prior to d/c.  Therapeutic Exercise: Patient performed the following exercises with verbal and tactile cues for proper technique: SLR, SAQ, LAQ, hip abd/add, and RLE heel/ toe raises.  With pt's hx of plantar fasciitis, she is recommended to keep watch over pain levels at R ankle and foot in order to continue sole stretching prior to starting ambulation again. But never to push into pain and to remember to allow at least 6 weeks for initial bone healing for foot and lateral ankle bone.   Patient supine  in bed at end of session with brakes locked, bed alarm set, and all needs within reach.    Discharge Criteria: Patient will be discharged from PT if patient refuses treatment 3 consecutive times without medical reason, if treatment goals not met, if there is a change in medical status, if patient makes no progress towards goals or if patient is discharged from hospital.  The above assessment, treatment plan, treatment alternatives and goals were discussed and mutually agreed upon: by patient  Alger Simons PT, DPT 07/28/2021, 1:24 PM

## 2021-07-29 NOTE — Progress Notes (Signed)
[place] PHYSICAL MEDICINE & REHABILITATION PROGRESS NOTE  Subjective/Complaints: Patient seen sitting up in bed this morning.  She states she slept well overnight.  Informed by nursing patient urgent request to see me due to needing to have a bowel movement.  When entering the room patient requested bed pan for BM and after returning to room, patient states she is still having BMs. She states she did not have a good first in therapies yesterday.   ROS: Denies CP, SOB, N/V/D  Objective: Vital Signs: Blood pressure 110/62, pulse 73, temperature 98 F (36.7 C), temperature source Oral, resp. rate 18, height 5\' 7"  (1.702 m), weight 86.9 kg, SpO2 97 %. No results found. No results for input(s): WBC, HGB, HCT, PLT in the last 72 hours.  Recent Labs    07/27/21 0552  CREATININE 0.92     Intake/Output Summary (Last 24 hours) at 07/29/2021 1833 Last data filed at 07/29/2021 1300 Gross per 24 hour  Intake 360 ml  Output --  Net 360 ml         Physical Exam: BP 110/62   Pulse 73   Temp 98 F (36.7 C) (Oral)   Resp 18   Ht 5\' 7"  (1.702 m)   Wt 86.9 kg   SpO2 97%   BMI 30.01 kg/m  Constitutional: No distress . Vital signs reviewed. HENT: Normocephalic.  Atraumatic. Eyes: EOMI. No discharge. Skin: Scattered bruising.  Left lower extremity and splint CDI Psych: Normal mood.  Normal behavior. Musc: Left upper extremity in sling.  Edema noted. Left lower extremity in splint with edema Right upper extremity with edema and tenderness Neuro: Alert  Assessment/Plan: 1. Functional deficits which require 3+ hours per day of interdisciplinary therapy in a comprehensive inpatient rehab setting. Physiatrist is providing close team supervision and 24 hour management of active medical problems listed below. Physiatrist and rehab team continue to assess barriers to discharge/monitor patient progress toward functional and medical goals   Care Tool:  Bathing  Bathing activity did not  occur:  (not assessed due to time constraints) Body parts bathed by patient: Front perineal area, Face   Body parts bathed by helper: Buttocks     Bathing assist Assist Level: Minimal Assistance - Patient > 75%     Upper Body Dressing/Undressing Upper body dressing   What is the patient wearing?: Pull over shirt    Upper body assist Assist Level: Maximal Assistance - Patient 25 - 49%    Lower Body Dressing/Undressing Lower body dressing      What is the patient wearing?: Pants     Lower body assist Assist for lower body dressing: Maximal Assistance - Patient 25 - 49%     Toileting Toileting Toileting Activity did not occur (Clothing management and hygiene only): N/A (no void or bm)  Toileting assist Assist for toileting: Maximal Assistance - Patient 25 - 49%     Transfers Chair/bed transfer  Transfers assist     Chair/bed transfer assist level: Minimal Assistance - Patient > 75%     Locomotion Ambulation   Ambulation assist   Ambulation activity did not occur: Safety/medical concerns          Walk 10 feet activity   Assist  Walk 10 feet activity did not occur: Safety/medical concerns        Walk 50 feet activity   Assist Walk 50 feet with 2 turns activity did not occur: Safety/medical concerns         Walk 150 feet  activity   Assist Walk 150 feet activity did not occur: Safety/medical concerns         Walk 10 feet on uneven surface  activity   Assist Walk 10 feet on uneven surfaces activity did not occur: Safety/medical concerns         Wheelchair     Assist Is the patient using a wheelchair?: Yes Type of Wheelchair: Manual    Wheelchair assist level: Total Assistance - Patient < 25% Max wheelchair distance: 20 ft    Wheelchair 50 feet with 2 turns activity    Assist        Assist Level: Total Assistance - Patient < 25%   Wheelchair 150 feet activity     Assist      Assist Level: Total Assistance  - Patient < 25%    Medical Problem List and Plan: 1. Functional deficits secondary to polytrauma including left tibial plateau fx and left trimalleolar fx with syndemosis rupture as well as left proximal humerus fx which required reverse TSA and right distal fib fx and 5th MT fx             -NWB LLE x 8 weeks--convert to CAM in 2 weeks             -NWB LUE-may use left arm for ADL's at waist level             -WBAT RLE with post-op shoe for transfers Continue CIR  2.  Antithrombotics: -DVT/anticoagulation:  Xarelto 10mg  daily             -antiplatelet therapy: n/a 3. Pain Management: oxycodone prn for moderate to severe pain,               -tylenol prn for mild pain             -robaxin 750mg  tid for muscle spasms             -naproxen 250mg  bid with meals             -gabapentin 100mg  tid             -ice prn  Controlled with meds on 12/4  Monitor with increased exertion 4. Mood/generalized anxiety disorder             -team to provide ego support as necessary             -antipsychotic agents: n/a             -transition to xanax which she was using PRN at home (being given valium on acute)             -trazodone prn for sleep  ?Improving 5. Neuropsych: This patient is capable of making decisions on her own behalf. 6. Skin/Wound Care: continue post-op dressing to left shoulder.              -pt to remain in splint LLE for 2 weeks with transition to CAM boot 7. Fluids/Electrolytes/Nutrition: encourage appropriate PO 8.  Drug-induced constipation             Scheduled miralax and senna-s 2 tabs at bedtime  Bowel meds increased on 12/3  Improving 9. Hypothyroidism             -Synthroid daily 10. Bones/hx of osteoporosis: calcium and vitamin d daily             -updated DEXA and further rx as outpt              -  pt was on actonel PTA 11. Hyperlipidemia: pt on pravastatin and olmesartan at home             -continue pravastatin for now per acute regimen 12. Hx of HTN:  controlled without meds currently  Monitor the increased mobility 13. Bladder: oob to void when possible. Can use purewick if needed at HS initially 14.  Acute blood loss anemia  Hemoglobin 10.4 on 12/1, labs ordered for tomorrow  LOS: 2 days A FACE TO FACE EVALUATION WAS PERFORMED  Kathryn Boyer Kathryn Boyer 07/29/2021, 6:33 PM

## 2021-07-29 NOTE — Progress Notes (Signed)
Occupational Therapy Session Note  Patient Details  Name: Kathryn Boyer MRN: [id number] Date of Birth: May 11, 1956  Today's Date: 07/30/2021 OT Individual Time: [phone] OT Individual Time Calculation (min): 62 min   Short Term Goals: Week 1:  OT Short Term Goal 1 (Week 1): STGs=LTGs due to ELOS  Skilled Therapeutic Interventions/Progress Updates:    Pt greeted while sitting EOB with pants down around thighs. Pt requesting to use the BSC, needing to urinate. Returned to bed and pt able to utilize 1 legged bridge (while wearing Rt darco shoe) in order for OT to elevate pants over hips. She then returned to EOB, rising to EOB on the Rt side. Pt is going to order a hospital bed for home. Min A for lateral scoot<bariatric drop arm BSC to maintain Lt LE NWB status and pt able to lean in order for OT to lower her clothing. While pt voided, we discussed purchasing a bidet for the [facility] at home, pt knows a plumber that she will call. She is also going to buy a recliner with the capability to lower each arm to allow for safer scooting. Education provided on wearing only loose fitting pants and even long gowns at home to increase ease of toileting and LB dressing. We also put on her DonJoy arm sling for the Lt arm. Educated pt that at this time MD order states that no PROM/AROM allowed for the shoulder though elbow, wrist, and hand ROM ok. She would like a handout regarding ROM at a later date. We made a list together of companies/people to call to arrange proper DME for home. Pt at this time unsure if she wants to purchase 2 ramps in order to access home or just reside in basement level. She transferred to the recliner via lateral scoot given CGA-Min A to keep the Lt foot off of the floor. Pt remained in the chair with all needs within reach. Tx focus placed on functional transfers, adaptive self care skills, and d/c planning.   Therapy Documentation Precautions:  Precautions Precautions: Fall, Shoulder Type  of Shoulder Precautions: Conservative shoulder protocol: no PROM/AROM. Cleared for ROM of L hand, wrist, and elbow. Shoulder Interventions: At all times, Off for dressing/bathing/exercises, Don joy ultra sling, Shoulder abduction pillow Precaution Comments: Reviewed shoulder precautions Required Braces or Orthoses: Other Brace, Splint/Cast Splint/Cast: LLE for 2 weeks then CAM boot, NWB for 8 weeks Other Brace: post op shoe RLE, WBAT Restrictions Weight Bearing Restrictions: Yes LUE Weight Bearing: Non weight bearing RLE Weight Bearing: Weight bearing as tolerated LLE Weight Bearing: Non weight bearing Other Position/Activity Restrictions: No restrictions knee ROM on left  Pain: pt premedicated for pain, adjusted DonJoy arm sling for pain relief as well   ADL: ADL Grooming: Maximal assistance Where Assessed-Grooming: Bed level Upper Body Bathing: Not assessed Lower Body Bathing: Not assessed Upper Body Dressing: Maximal assistance Where Assessed-Upper Body Dressing: Edge of bed Lower Body Dressing: Maximal assistance Where Assessed-Lower Body Dressing: Bed level Toileting: Not assessed Where Assessed-Toileting: Bedside Commode Toilet Transfer: Minimal assistance Toilet Transfer Method: Neurosurgeon Equipment: Drop arm bedside commode Tub/Shower Transfer: Not assessed   Therapy/Group: Individual Therapy  [name] A [name] 07/30/2021, 12:30 PM

## 2021-07-29 NOTE — Progress Notes (Signed)
Pt disimpacted  for large amount of hard lumps of stool. Pt tolerated well.   Marylu Lund, RN

## 2021-07-30 MED ORDER — SENNOSIDES-DOCUSATE SODIUM 8.6-50 MG PO TABS
2.0000 | ORAL_TABLET | Freq: Two times a day (BID) | ORAL | Status: DC
  Administered 2021-07-30 – 2021-08-02 (×6): 2 via ORAL
  Filled 2021-07-30 (×6): qty 2

## 2021-07-30 NOTE — Progress Notes (Signed)
Inpatient Rehabilitation Care Coordinator Assessment and Plan Patient Details  Name: Kathryn Boyer MRN: [id number] Date of Birth: 18-Dec-1955  Today's Date: 07/30/2021  Hospital Problems: Principal Problem:   Critical polytrauma Active Problems:   Acute blood loss anemia   History of hypertension   Drug induced constipation   Postoperative pain  Past Medical History:  Past Medical History:  Diagnosis Date   Anxiety    Asthma    Hyperlipidemia    Hypertension    Plantar fasciitis    Thyroid disease    Past Surgical History:  Past Surgical History:  Procedure Laterality Date   ABDOMINAL HYSTERECTOMY     BREAST SURGERY     NOSE SURGERY     ORIF ANKLE FRACTURE Left 07/23/2021   Procedure: OPEN REDUCTION INTERNAL FIXATION (ORIF) ANKLE FRACTURE;  Surgeon: Myrene Galas, MD;  Location: [facility] OR;  Service: Orthopedics;  Laterality: Left;   REVERSE SHOULDER ARTHROPLASTY Left 07/25/2021   Procedure: LEFT REVERSE SHOULDER ARTHROPLASTY;  Surgeon: Bjorn Pippin, MD;  Location: [facility] OR;  Service: Orthopedics;  Laterality: Left;   Social History:  reports that she has never smoked. She has never used smokeless tobacco. She reports that she does not drink alcohol and does not use drugs.  Family / Support Systems Marital Status: Married Patient Roles: Spouse, Parent, Other (Comment) Transport planner) Spouse/Significant Other: Raiford Noble [phone] Other Supports: Extended family Anticipated Caregiver: Husband and hired caregivers has not hired yet Ability/Limitations of Caregiver: Husband works but can take time off to Geneticist, molecular Availability: 24/7 (Husband until hired assist) Family Dynamics: Close with family who will check on but can not be there with her. 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 to Work Plans: Needs to recover first Marine scientist Issues: No issues Guardian/Conservator: none-according to MD pt is capable of making her own decisions while here   Abuse/Neglect Abuse/Neglect Assessment Can Be Completed: Yes Physical Abuse: Denies Verbal Abuse: Denies Sexual Abuse: Denies Exploitation of patient/patient's resources: Denies Self-Neglect: Denies  Patient response to: Social Isolation - How often do you feel lonely or isolated from those around you?: Sometimes  Emotional Status Pt's affect, behavior and adjustment status: Pt is motivated to improve and do what she can for herself, she is very limited due to WB issues and the need to recover which will take time. Recent Psychosocial Issues: other health issues-osteoporsis Psychiatric History: No history Substance Abuse History: None  Patient / Family Perceptions, Expectations & Goals Pt/Family understanding of illness & functional limitations: Pt can explain her injuries and fractures. She has her right arm to use. She talks with the MD daily and feels has a good understanding of her condition and issues Premorbid pt/family roles/activities: Wife, Mom, retiree, church member, etc Anticipated changes in roles/activities/participation: resume Pt/family expectations/goals: Pt states: " I hope to be here a short time, I only have the use of my arm and can transfer."  [employer]: None Premorbid Home Care/DME Agencies: Other (Comment) (has wheehchair, rw and tub seat) Transportation available at discharge: Husband Is the patient able to respond to transportation needs?: Yes In the past 12 months, has lack of transportation kept you  from medical appointments or from getting  medications?: No In the past 12 months, has lack of transportation kept you from meetings, work, or from getting things needed for daily living?: No  Discharge Planning Living Arrangements: Spouse/significant other Support Systems: Spouse/significant other, Church/faith community, Friends/neighbors Type of Residence: Private residence Insurance Resources: Media planner (specify) Herbalist) Financial Resources: Family Support, Employment Financial Screen Referred: No Living Expenses: Own Money Management: Patient, Spouse Does the patient have any problems obtaining your medications?: No Home Management: Some hired for cleaning-pt and husband Programmer, applications Preliminary Plans: Retrun home with husband who does work but can take time off to assist. They plan to hire assist for her and are aware it will be private pay. Will work on equipment needs and follow up. Care Coordinator Barriers to Discharge: Weight bearing restrictions Care Coordinator Anticipated Follow Up Needs: HH/OP  Clinical Impression Pleasant female who is motivated to do transfers and aware how limited she is and hopes to be short stay which she will be since she is so limited. Will work on DME and if needs follow up.  Lucy Chris 07/30/2021, 10:06 AM

## 2021-07-30 NOTE — Discharge Instructions (Addendum)
Inpatient Rehab Discharge Instructions  Kathryn Boyer Discharge date and time: No discharge date for patient encounter.   Activities/Precautions/ Functional Status: Activity: non weight bearing left arm and leg.  Weightbearing as tolerated right lower extremity Diet: regular Wound Care: routine skin checks Functional status:  ___ No restrictions     ___ Walk up steps independently ___ 24/7 supervision/assistance   ___ Walk up steps with assistance ___ Intermittent supervision/assistance  ___ Bathe/dress independently ___ Walk with walker     __x_ Bathe/dress with assistance ___ Walk Independently    ___ Shower independently ___ Walk with assistance    ___ Shower with assistance ___ No alcohol     ___ Return to work/school ________  Special Instructions:  NO driving smoking alcohol or driving  Follow-up with Dr. Carola Frost on converting to cam boot left lower extremity   Continue XARELTO  until 08/26/21 and stop   COMMUNITY REFERRALS UPON DISCHARGE:    HOME EXERCISE PROGRAM UNTIL PATIENT CAN WEAR BEAR AND THEN WILL NEED OUTPATIENT THERAPIES  Medical Equipment/Items Ordered:HOSPITAL BED, WHEELCHAIR, BARIATRIC DROP-ARM BEDSIDE COMMODE AND TRANSFER BOARD                                                 Agency/Supplier:[facility]  [phone]    My questions have been answered and I understand these instructions. I will adhere to these goals and the provided educational materials after my discharge from the hospital.  Patient/Caregiver Signature _______________________________ Date __________  Clinician Signature _______________________________________ Date __________  Please bring this form and your medication list with you to all your follow-up doctor's appointments.    ________________________________________________________________________________________________________________________________  Information on my medicine - XARELTO (Rivaroxaban)  This medication education  was reviewed with me or my healthcare representative as part of my discharge preparation. This is a short-term medication that will end after your last dose on 08/26/2021  Why was Xarelto prescribed for you? Xarelto was prescribed for you to reduce the risk of blood clots forming after orthopedic surgery. The medical term for these abnormal blood clots is venous thromboembolism (VTE).  What do you need to know about xarelto ? Take your Xarelto ONCE DAILY at the same time every day. You may take it either with or without food.  If you have difficulty swallowing the tablet whole, you may crush it and mix in applesauce just prior to taking your dose.  Take Xarelto exactly as prescribed by your doctor and DO NOT stop taking Xarelto without talking to the doctor who prescribed the medication.  Stopping without other VTE prevention medication to take the place of Xarelto may increase your risk of developing a clot.  After discharge, you should have regular check-up appointments with your healthcare provider that is prescribing your Xarelto.    What do you do if you miss a dose? If you miss a dose, take it as soon as you remember on the same day then continue your regularly scheduled once daily regimen the next day. Do not take two doses of Xarelto on the same day.   Important Safety Information A possible side effect of Xarelto is bleeding. You should call your healthcare provider right away if you experience any of the following: Bleeding from an injury or your nose that does not stop. Unusual colored urine (red or dark brown) or unusual colored stools (red or black). Unusual  bruising for unknown reasons. A serious fall or if you hit your head (even if there is no bleeding).  Some medicines may interact with Xarelto and might increase your risk of bleeding while on Xarelto. To help avoid this, consult your healthcare provider or pharmacist prior to using any new prescription or  non-prescription medications, including herbals, vitamins, non-steroidal anti-inflammatory drugs (NSAIDs) and supplements.  This website has more information on Xarelto: [email or web address].

## 2021-07-30 NOTE — Progress Notes (Signed)
[place] PHYSICAL MEDICINE & REHABILITATION PROGRESS NOTE  Subjective/Complaints:   Required disimpaction last noc, pt limiting fluids due to chronic urinary retention  C/o urine being dark but no pain with urination   ROS: Denies CP, SOB, N/V/D  Objective: Vital Signs: Blood pressure 114/60, pulse 84, temperature 98.9 F (37.2 C), temperature source Oral, resp. rate 16, height 5\' 7"  (1.702 m), weight 86.9 kg, SpO2 94 %. No results found. No results for input(s): WBC, HGB, HCT, PLT in the last 72 hours.  No results for input(s): NA, K, CL, CO2, GLUCOSE, BUN, CREATININE, CALCIUM in the last 72 hours.   Intake/Output Summary (Last 24 hours) at 07/30/2021 0904 Last data filed at 07/30/2021 0807 Gross per 24 hour  Intake 640 ml  Output 800 ml  Net -160 ml         Physical Exam: BP 114/60 (BP Location: Right Arm)   Pulse 84   Temp 98.9 F (37.2 C) (Oral)   Resp 16   Ht 5\' 7"  (1.702 m)   Wt 86.9 kg   SpO2 94%   BMI 30.01 kg/m   General: No acute distress Mood and affect are appropriate Heart: Regular rate and rhythm no rubs murmurs or extra sounds Lungs: Clear to auscultation, breathing unlabored, no rales or wheezes Abdomen: Positive bowel sounds, soft nontender to palpation, mildly distension Extremities: No clubbing, cyanosis, or edema Skin: No evidence of breakdown, no evidence of rash   Musc: Left upper extremity in sling.  Edema noted. Left lower extremity in splint with edema Right upper extremity with edema and tenderness Neuro: Alert  Assessment/Plan: 1. Functional deficits which require 3+ hours per day of interdisciplinary therapy in a comprehensive inpatient rehab setting. Physiatrist is providing close team supervision and 24 hour management of active medical problems listed below. Physiatrist and rehab team continue to assess barriers to discharge/monitor patient progress toward functional and medical goals   Care Tool:  Bathing  Bathing  activity did not occur:  (not assessed due to time constraints) Body parts bathed by patient: Front perineal area, Face   Body parts bathed by helper: Buttocks     Bathing assist Assist Level: Minimal Assistance - Patient > 75%     Upper Body Dressing/Undressing Upper body dressing   What is the patient wearing?: Pull over shirt    Upper body assist Assist Level: Maximal Assistance - Patient 25 - 49%    Lower Body Dressing/Undressing Lower body dressing      What is the patient wearing?: Pants     Lower body assist Assist for lower body dressing: Maximal Assistance - Patient 25 - 49%     Toileting Toileting Toileting Activity did not occur (Clothing management and hygiene only): N/A (no void or bm)  Toileting assist Assist for toileting: Maximal Assistance - Patient 25 - 49%     Transfers Chair/bed transfer  Transfers assist     Chair/bed transfer assist level: Minimal Assistance - Patient > 75%     Locomotion Ambulation   Ambulation assist   Ambulation activity did not occur: Safety/medical concerns          Walk 10 feet activity   Assist  Walk 10 feet activity did not occur: Safety/medical concerns        Walk 50 feet activity   Assist Walk 50 feet with 2 turns activity did not occur: Safety/medical concerns         Walk 150 feet activity   Assist Walk 150 feet  activity did not occur: Safety/medical concerns         Walk 10 feet on uneven surface  activity   Assist Walk 10 feet on uneven surfaces activity did not occur: Safety/medical concerns         Wheelchair     Assist Is the patient using a wheelchair?: Yes Type of Wheelchair: Manual    Wheelchair assist level: Total Assistance - Patient < 25% Max wheelchair distance: 20 ft    Wheelchair 50 feet with 2 turns activity    Assist        Assist Level: Total Assistance - Patient < 25%   Wheelchair 150 feet activity     Assist      Assist Level:  Total Assistance - Patient < 25%    Medical Problem List and Plan: 1. Functional deficits secondary to polytrauma including left tibial plateau fx and left trimalleolar fx with syndemosis rupture as well as left proximal humerus fx which required reverse TSA and right distal fib fx and 5th MT fx             -NWB LLE x 8 weeks--convert to CAM in 2 weeks             -NWB LUE-may use left arm for ADL's at waist level             -WBAT RLE with post-op shoe for transfers Continue CIR PT, OT, SLP  2.  Antithrombotics: -DVT/anticoagulation:  Xarelto 10mg  daily             -antiplatelet therapy: n/a 3. Pain Management: oxycodone prn for moderate to severe pain,               -tylenol prn for mild pain             -robaxin 750mg  tid for muscle spasms             -naproxen 250mg  bid with meals             -gabapentin 100mg  tid             -ice prn  Controlled with meds on 12/5  Monitor with increased exertion 4. Mood/generalized anxiety disorder             -team to provide ego support as necessary             -antipsychotic agents: n/a             -transition to xanax which she was using PRN at home (being given valium on acute)             -trazodone prn for sleep  ?Improving 5. Neuropsych: This patient is capable of making decisions on her own behalf. 6. Skin/Wound Care: continue post-op dressing to left shoulder.              -pt to remain in splint LLE for 2 weeks with transition to CAM boot 7. Fluids/Electrolytes/Nutrition: encourage appropriate PO, I/O balance - .  Suspect dark urine is related to urine concentration due to neg I/O, Check UA  8.  Drug-induced constipation             Scheduled miralax and senna-s 2 tabs at bedtime  Bowel meds increased on 12/3  Improving 9. Hypothyroidism             -Synthroid daily 10. Bones/hx of osteoporosis: calcium and vitamin d daily             -  updated DEXA and further rx as outpt              -pt was on actonel PTA 11.  Hyperlipidemia: pt on pravastatin and olmesartan at home             -continue pravastatin for now per acute regimen 12. Hx of HTN: controlled without meds currently  Monitor the increased mobility 13. Bladder: oob to void when possible. Can use purewick if needed at HS initially 14.  Acute blood loss anemia  Hemoglobin 10.4 on 12/1, labs ordered for 12/6 15.  Constipation- Increase sennaS to BID LOS: 3 days A FACE TO FACE EVALUATION WAS PERFORMED  Erick Colace 07/30/2021, 9:04 AM

## 2021-07-30 NOTE — Progress Notes (Signed)
Occupational Therapy Session Note  Patient Details  Name: Kathryn Boyer MRN: [id number] Date of Birth: Apr 12, 1956  Today's Date: 07/30/2021 OT Individual Time: 1130-1155 and 1300-1410 OT Individual Time Calculation (min): 25 min and 70 min   Short Term Goals: Week 1:  OT Short Term Goal 1 (Week 1): STGs=LTGs due to ELOS   Skilled Therapeutic Interventions/Progress Updates:    Session 1: Pt greeted at time of session sitting up in wheelchair agreeable to OT session, no reports of pain and finishing up IADL task of managing check book. Pt with multiple questions and extensive conversation regarding DC planning home (planning for caregiver and husband to assist), pt adamant that she will be leaving this Thursday. Pt with extensive discussions regarding orders for sling, WB status, weeks of current WB status, and functional use allowed with LUE. Relayed to the pt orders that are currently in the computer and relayed that would need to reach out further regarding specifics. Pt up in chair alarm on call bell in reach.   Session 2:Pt greeted at time of session sitting up in recliner, no pain at rest, agreeable to OT session. Pt having multiple questions again regarding total shoulder protocol for future therapies when able to mobilize. Reiterated to the patient that she is not allowed any AROM/PROM for L shoulder at this time, pt verbalized understanding but wanting protocol in hand for future therapies after DC. Placed call to Dr. Austin Miles office and spoke with receptionist, provided fax # and contact info if needed. Receptionist stating that she will relay the request as soon as possible, relayed all above info to the pt who was grateful. Pt having questions regarding lateral scoot transfers to/from [facility] at home, going to R side and moving wheelchair to opposite side of BSC to again allow for easier transfer to the R. Educated that the pt may do this at home, but also needs to practice transfers to the L  for situations where this is not possible. Pt performing lateral scoot transfer recliner <> DABSC with CGA, education and demonstration for forward weight shifting and keeping LLE out, improved form noted. Extensive discussion with the patient answering multiple questions regarding DC, DME needs, assistance at home, etc. Pt up in chair with alarm on call bell in reach.   Therapy Documentation Precautions:  Precautions Precautions: Fall, Shoulder Type of Shoulder Precautions: Conservative shoulder protocol: no PROM/AROM. Cleared for ROM of L hand, wrist, and elbow. Shoulder Interventions: At all times, Off for dressing/bathing/exercises, Don joy ultra sling, Shoulder abduction pillow Precaution Comments: Reviewed shoulder precautions Required Braces or Orthoses: Other Brace, Splint/Cast Splint/Cast: LLE for 2 weeks then CAM boot, NWB for 8 weeks Other Brace: post op shoe RLE, WBAT Restrictions Weight Bearing Restrictions: Yes LUE Weight Bearing: Non weight bearing RLE Weight Bearing: Weight bearing as tolerated LLE Weight Bearing: Non weight bearing Other Position/Activity Restrictions: No restrictions knee ROM on left     Therapy/Group: Individual Therapy  Erasmo Score 07/30/2021, 7:25 AM

## 2021-07-30 NOTE — Progress Notes (Signed)
Inpatient Rehabilitation Center Individual Statement of Services  Patient Name:  Kathryn Boyer  Date:  07/30/2021  Welcome to the Inpatient Rehabilitation Center.  Our goal is to provide you with an individualized program based on your diagnosis and situation, designed to meet your specific needs.  With this comprehensive rehabilitation program, you will be expected to participate in at least 3 hours of rehabilitation therapies Monday-Friday, with modified therapy programming on the weekends.  Your rehabilitation program will include the following services:  Physical Therapy (PT), Occupational Therapy (OT), 24 hour per day rehabilitation nursing, Care Coordinator, Rehabilitation Medicine, Nutrition Services, and Pharmacy Services  Weekly team conferences will be held on Tuesday to discuss your progress.  Your Inpatient Rehabilitation Care Coordinator will talk with you frequently to get your input and to update you on team discussions.  Team conferences with you and your family in attendance may also be held.  Expected length of stay: 5-7 days  Overall anticipated outcome: supervision- PT CGA-MIn OT goals  Depending on your progress and recovery, your program may change. Your Inpatient Rehabilitation Care Coordinator will coordinate services and will keep you informed of any changes. Your Inpatient Rehabilitation Care Coordinator's name and contact numbers are listed  below.  The following services may also be recommended but are not provided by the Inpatient Rehabilitation Center:  Driving Evaluations Home Health Rehabiltiation Services Outpatient Rehabilitation Services Vocational Rehabilitation   Arrangements will be made to provide these services after discharge if needed.  Arrangements include referral to agencies that provide these services.  Your insurance has been verified to be:  BCBS Your primary doctor is:  Loreen Freud  Pertinent information will be shared with your doctor and  your insurance company.  Inpatient Rehabilitation Care Coordinator:  Dossie Der, [place] [phone] or [phone]  Information discussed with and copy given to patient by: Lucy Chris, 07/30/2021, 10:10 AM

## 2021-07-30 NOTE — IPOC Note (Signed)
Overall Plan of Care [facility]) Patient Details Name: Kathryn Boyer MRN: [id number] DOB: 05/21/1956  Admitting Diagnosis: Critical polytrauma  Hospital Problems: Principal Problem:   Critical polytrauma Active Problems:   Acute blood loss anemia   History of hypertension   Drug induced constipation   Postoperative pain     Functional Problem List: Nursing Bowel, Edema, Endurance, Medication Management, Pain, Skin Integrity, Safety  PT Balance, Edema, Endurance, Pain, Perception, Safety, Sensory, Skin Integrity  OT Balance, Edema, Endurance, Motor, Safety  SLP    TR         Basic ADL's: OT Grooming, Bathing, Dressing, Toileting     Advanced  ADL's: OT Simple Meal Preparation     Transfers: PT Bed Mobility, Bed to Chair, Car, Occupational psychologist, Research scientist (life sciences): PT Ambulation, Psychologist, prison and probation services, Stairs     Additional Impairments: OT Fuctional Use of Upper Extremity  SLP        TR      Anticipated Outcomes Item Anticipated Outcome  Self Feeding No goal  Swallowing      Basic self-care  Min A UB dressing, Min A grooming  Toileting  No goal- pt reports she plans to have a home health aide and wants Max A   Bathroom Transfers CGA (BSC transfer is important to her in regards to functional independence)  Bowel/Bladder  min assist  Transfers  CGA/ supervision  Locomotion  supervision in w/c  Communication     Cognition     Pain  <3  Safety/Judgment  min assist and no falls   Therapy Plan: PT Intensity: Minimum of 1-2 x/day ,45 to 90 minutes PT Frequency: 5 out of 7 days PT Duration Estimated Length of Stay: 7 days OT Intensity: Minimum of 1-2 x/day, 45 to 90 minutes OT Frequency: 5 out of 7 days OT Duration/Estimated Length of Stay: 5-7 days     Due to the current state of emergency, patients may not be receiving their 3-hours of Medicare-mandated therapy.   Team Interventions: Nursing Interventions Patient/Family Education, Bowel  Management, Pain Management, Medication Management, Skin Care/Wound Management, Discharge Planning  PT interventions Community reintegration, DME/adaptive equipment instruction, Neuromuscular re-education, Psychosocial support, UE/LE Strength taining/ROM, Wheelchair propulsion/positioning, UE/LE Coordination activities, Therapeutic Activities, Skin care/wound management, Pain management, Functional electrical stimulation, Discharge planning, Balance/vestibular training, Cognitive remediation/compensation, Disease management/prevention, Functional mobility training, Patient/family education, Splinting/orthotics, Therapeutic Exercise, Visual/perceptual remediation/compensation  OT Interventions Balance/vestibular training, Disease mangement/prevention, Self Care/advanced ADL retraining, Therapeutic Exercise, Wheelchair propulsion/positioning, DME/adaptive equipment instruction, Pain management, Skin care/wound managment, UE/LE Strength taining/ROM, UE/LE Coordination activities, Splinting/orthotics, Patient/family education, Community reintegration, Discharge planning, Functional mobility training, Psychosocial support, Therapeutic Activities  SLP Interventions    TR Interventions    SW/CM Interventions Discharge Planning, Psychosocial Support, Patient/Family Education   Barriers to Discharge MD  Medical stability  Nursing Decreased caregiver support, Home environment access/layout, Wound Care, Lack of/limited family support, Weight bearing restrictions, Medication compliance Patient lives in 2 level home with steps to enter and no rails. Able to live on main level with bedroom/bathroom on main level. Lives with spouse who works but can take some time off/2 weeks.  PT Inaccessible home environment, Decreased caregiver support, Home environment access/layout, Incontinence, Wound Care, Lack of/limited family support, Weight bearing restrictions    OT Inaccessible home environment, Weight bearing  restrictions, Home environment access/layout Pt currently trying to figure out home to access home w/c level  SLP      SW Weight bearing restrictions  Team Discharge Planning: Destination: PT-Home ,OT- Home , SLP-  Projected Follow-up: PT-Home health PT, OT-  24 hour supervision/assistance, SLP-  Projected Equipment Needs: PT-3 in 1 bedside comode, Sliding board, OT- To be determined (will need drop arm BSC), SLP-  Equipment Details: PT- , OT-  Patient/family involved in discharge planning: PT- Patient,  OT-Patient, SLP-   MD ELOS: 7-9d Medical Rehab Prognosis:  Good Assessment:  65 year old white female who was walking inside of her house on November 25 going down to a landing where she misplaced her foot landing on her left shoulder and twisting her left ankle violently.  She immediately complained of pain in both feet and ankles as well as her left shoulder.  There is no loss of consciousness.  Patient suffered a left depressed medial tibial plateau fracture, a left trimalleolar ankle fracture/dislocation and ruptured syndesmosis, left proximal humerus fracture, right nondisplaced lateral malleolus fracture, right fifth metatarsal base fracture.  Patient initially was seen at [facility] but then given the complexity of the fractures was sent to [facility] for further care.  On 11/28 patient underwent open reduction internal fixation of left bimalleolar ankle fracture, open reduction internal fixation of syndesmosis, closed treatment of left medial condyle fracture of the tibia by Dr. Carola Frost.  It was felt that the left split proximal humerus fracture was unreconstructable. Dr. Everardo Pacific was consulted and the patient underwent a left reverse total shoulder arthroplasty on 07/25/2021.  Patient is nonweightbearing left upper extremity, okay to use arm for ADLs at waist level, sling at all times except for ADLs and waist level exercises.  Patient is nonweightbearing for 8 weeks left  lower extremity in splint.  Plan will to be convert patient to cam boot in about 2 weeks.  Patient is weightbearing as tolerated right lower extremity in postop shoe.  Patient was placed on Lovenox 40 mg every 24 hours for DVT prophylaxis.  Patient reports not much appetite.  She has not moved her bowels since coming to the hospital.  Medications for pain have been effective for controlling her pain although they do make her a bit sleepy.     See Team Conference Notes for weekly updates to the plan of care

## 2021-07-30 NOTE — Progress Notes (Signed)
Inpatient Rehabilitation  Patient information reviewed and entered into eRehab system by Falana Clagg M. Estrella Alcaraz, M.A., CCC/SLP, PPS Coordinator.  Information including medical coding, functional ability and quality indicators will be reviewed and updated through discharge.    

## 2021-07-30 NOTE — Discharge Summary (Signed)
Patient ID: Kathryn Boyer MRN: [id number] DOB/AGE: Jul 12, 1956 65 y.o.  Admit date: 07/20/2021 Discharge date: 07/27/2021 to CIR  Admission Diagnoses: Fall Bilateral ankle fractures Left shoulder fracture Right foot fracture   Discharge Diagnoses:  Principal Problem:   Left trimalleolar fracture, closed, initial encounter Active Problems:   Adjustment disorder with anxiety   Generalized anxiety disorder   Hyperlipidemia LDL goal <100   Fall   Humerus fracture   Tibial plateau fracture, left   Nondisplaced fracture of fifth metatarsal bone, right foot, initial encounter for closed fracture   Nondisplaced fracture of lateral malleolus of right fibula, initial encounter for closed fracture   Past Medical History:  Diagnosis Date   Anxiety    Asthma    Hyperlipidemia    Hypertension    Plantar fasciitis    Thyroid disease      Procedures Performed:  - s/p ORIF left ankle fracture dislocation with Dr. Carola Frost    - s/p Left reverse total shoulder arthroplasty for proximal humerus fracture with Dr. Everardo Pacific   - non operative management of left tibial plateau fracture   - non operative management of right distal fibula fracture and fifth metatarsal bone  Discharged Condition: stable  Hospital Course: Patient brought to [facility] after a mechanical fall. She was found to have left ankle fracture dislocation, left medial tibial plateau fracture, left proximal humerus fracture, right distal fibula fracture, right foot fracture. She underwent operative fixation for her left ankle fracture and left proximal humerus fracture.  She tolerated procedure well.  She was kept for monitoring overnight for pain control, medical monitoring postop, PT/OT evaluation, and discharge planning.  She was found to be a good candidate for [facility] Inpatient rehab. She was transferred to [facility] on Friday, 12/2.  Patient was instructed on specific activity restrictions and all questions were  answered.  Consults: PT/OT/CIR  Significant Diagnostic Studies: No additional pertinent studies  Treatments: Surgery  Discharge Exam: General: sitting up in hospital bed, NAD LUE: Dressing CDI and sling well fitting,  full and painless ROM throughout hand with DPC of 0. + Motor in  AIN, PIN, Ulnar distributions. Axillary nerve sensation preserved and symmetric.  Sensation intact in medial, radial, and ulnar distributions. Well perfused digits.  LLE: splint CDI, +EHL though remainder of motor difficult to test due to splint, sensation intact distally with warm well perfused foot, no pain w passive stretch. Active ROM of left knee from 0 to 90 degrees RLE: post op shoe in place. Continues to have swelling right foot and ankle. intact EHL/TA/GSC. Endorses distal sensation. Warm well perfused foot  Disposition: Discharge disposition: 02-Transferred to [facility]       Discharge Instructions     Call MD for:  persistant nausea and vomiting   Complete by: As directed    Call MD for:  temperature >100.4   Complete by: As directed    Diet - low sodium heart healthy   Complete by: As directed       Allergies as of 07/27/2021       Reactions   Contrast Media [iodinated Diagnostic Agents] Hives   Isovue [iopamidol] Hives   After returning home pt called to inform technologist of rash to face and neck, denies difficulty breathing or swallowing, pt took 2 benedryl Will need premeds in future   Amoxicillin Other (See Comments)   Headache Cephalosporin tolerant 07/23/2021   Belviq Xr [lorcaserin Hcl Er] Itching   Caused her face to turn red.  Saxenda [liraglutide -weight Management] Other (See Comments)   Caused indigestion.   Penicillins Rash   Cephalosporin Tolerant 07/23/2021        Medication List     TAKE these medications    ALPRAZolam 0.25 MG tablet Commonly known as: XANAX Take 1 tablet (0.25 mg total) by mouth daily as needed. What changed: reasons to  take this   cyclobenzaprine 5 MG tablet Commonly known as: FLEXERIL Take 1 tablet (5 mg total) by mouth 3 (three) times daily as needed for muscle spasms.   estradiol 0.025 MG/24HR Commonly known as: VIVELLE-DOT Place 1 patch onto the skin 2 (two) times a week.   estradiol 0.1 MG/GM vaginal cream Commonly known as: ESTRACE Place 1 Applicatorful vaginally 3 (three) times a week.   famotidine 20 MG tablet Commonly known as: PEPCID TAKE 1 TABLET BY MOUTH TWICE DAILY   Flovent HFA 110 MCG/ACT inhaler Generic drug: fluticasone Inhale 2 puffs into the lungs 2 (two) times daily.   flunisolide 25 MCG/ACT (0.025%) Soln Commonly known as: NASALIDE Place 2 sprays into the nose 2 (two) times daily.   levothyroxine 100 MCG tablet Commonly known as: SYNTHROID TAKE 1 TABLET BY MOUTH EVERY DAY What changed: when to take this   montelukast 10 MG tablet Commonly known as: SINGULAIR Take 1 tablet (10 mg total) by mouth at bedtime.   olmesartan 40 MG tablet Commonly known as: BENICAR TAKE 1 TABLET(40 MG) BY MOUTH DAILY What changed: See the new instructions.   pravastatin 20 MG tablet Commonly known as: PRAVACHOL TAKE 1 TABLET(20 MG) BY MOUTH DAILY What changed: See the new instructions.   risedronate 35 MG tablet Commonly known as: ACTONEL TAKE 1 TAB BY MOUTH EVERY 7 DAYS WITH WATER ON EMPTY STOMACH, NOTHING BY MOUTH OR LIE DOWN FOR NEXT 30 MINUTES.   Ventolin HFA 108 (90 Base) MCG/ACT inhaler Generic drug: albuterol Inhale 2 puffs into the lungs every 4 (four) hours as needed for wheezing or shortness of breath.   Vitamin D (Ergocalciferol) 1.25 MG (50000 UNIT) Caps capsule Commonly known as: DRISDOL TAKE 1 CAPSULE (50,000 UNITS TOTAL) BY MOUTH EVERY 7 (SEVEN) DAYS.         Alfonse Alpers, PA-C

## 2021-07-30 NOTE — Plan of Care (Signed)
  Problem: RH Balance Goal: LTG Patient will maintain dynamic sitting balance (PT) Description: LTG:  Patient will maintain dynamic sitting balance with assistance during mobility activities (PT) Flowsheets (Taken 07/28/2021 1332) LTG: Pt will maintain dynamic sitting balance during mobility activities with:: Independent   Problem: RH Bed Mobility Goal: LTG Patient will perform bed mobility with assist (PT) Description: LTG: Patient will perform bed mobility with assistance, with/without cues (PT). Flowsheets (Taken 07/28/2021 1332) LTG: Pt will perform bed mobility with assistance level of: Set up assist    Problem: RH Bed to Chair Transfers Goal: LTG Patient will perform bed/chair transfers w/assist (PT) Description: LTG: Patient will perform bed to chair transfers with assistance (PT). Flowsheets (Taken 07/28/2021 1332) LTG: Pt will perform Bed to Chair Transfers with assistance level: Supervision/Verbal cueing   Problem: RH Car Transfers Goal: LTG Patient will perform car transfers with assist (PT) Description: LTG: Patient will perform car transfers with assistance (PT). Flowsheets (Taken 07/28/2021 1332) LTG: Pt will perform car transfers with assist:: Supervision/Verbal cueing   Problem: RH Furniture Transfers Goal: LTG Patient will perform furniture transfers w/assist (OT/PT) Description: LTG: Patient will perform furniture transfers  with assistance (OT/PT). Flowsheets (Taken 07/28/2021 1332) LTG: Pt will perform furniture transfers with assist:: Supervision/Verbal cueing   Problem: RH Wheelchair Mobility Goal: LTG Patient will propel w/c in controlled environment (PT) Description: LTG: Patient will propel wheelchair in controlled environment, # of feet with assist (PT) Flowsheets (Taken 07/28/2021 1332) LTG: Pt will propel w/c in controlled environ  assist needed:: Supervision/Verbal cueing LTG: Propel w/c distance in controlled environment: at least 141ft Goal: LTG Patient  will propel w/c in home environment (PT) Description: LTG: Patient will propel wheelchair in home environment, # of feet with assistance (PT). Flowsheets (Taken 07/28/2021 1332) LTG: Pt will propel w/c in home environ  assist needed:: Supervision/Verbal cueing Distance: wheelchair distance in controlled environment: 100 LTG: Propel w/c distance in home environment: at least 50 ft

## 2021-07-30 NOTE — Progress Notes (Signed)
Physical Therapy Session Note  Patient Details  Name: Kathryn Boyer MRN: [id number] Date of Birth: November 16, 1955  Today's Date: 07/30/2021 PT Individual Time: 1004-1100 PT Individual Time Calculation (min): 56 min   Short Term Goals: Week 1:  PT Short Term Goal 1 (Week 1): STG = LTG d/t short ELOS  Skilled Therapeutic Interventions/Progress Updates:    Pt received in recliner and agreeable to therapy.  Pt reports pain in her shoulder and L foot, unrated and premedicated. Provided positioning as needed. Session focused on pt education and d/c planning, discussing options for car transfer and medical transport as needed. Discussed need pelvic floor PT after d/c for incontinence symptoms. Pt requested to use BSC, supervision lateral scoot transfer and continent urine void. Pt required assist with clothing management, set up for hygiene. Pt then directed in LAQ BIL for LE strength and ROM. Pt had extensive questions about her precautions, which therapist attempted to answer. Pt returned to recliner and remained there after session with all needs in reach.   Therapy Documentation Precautions:  Precautions Precautions: Fall, Shoulder Type of Shoulder Precautions: Conservative shoulder protocol: no PROM/AROM. Cleared for ROM of L hand, wrist, and elbow. Shoulder Interventions: At all times, Off for dressing/bathing/exercises, Don joy ultra sling, Shoulder abduction pillow Precaution Comments: Reviewed shoulder precautions Required Braces or Orthoses: Other Brace, Splint/Cast Splint/Cast: LLE for 2 weeks then CAM boot, NWB for 8 weeks Other Brace: post op shoe RLE, WBAT Restrictions Weight Bearing Restrictions: Yes LUE Weight Bearing: Non weight bearing RLE Weight Bearing: Weight bearing as tolerated LLE Weight Bearing: Non weight bearing Other Position/Activity Restrictions: No restrictions knee ROM on left    Therapy/Group: Individual Therapy  Juluis Rainier 07/30/2021, 12:12 PM

## 2021-07-31 LAB — CBC WITH DIFFERENTIAL/PLATELET
Abs Immature Granulocytes: 0.14 10*3/uL — ABNORMAL HIGH (ref 0.00–0.07)
Basophils Absolute: 0 10*3/uL (ref 0.0–0.1)
Basophils Relative: 1 %
Eosinophils Absolute: 0.3 10*3/uL (ref 0.0–0.5)
Eosinophils Relative: 3 %
HCT: 30.8 % — ABNORMAL LOW (ref 36.0–46.0)
Hemoglobin: 10.2 g/dL — ABNORMAL LOW (ref 12.0–15.0)
Immature Granulocytes: 2 %
Lymphocytes Relative: 27 %
Lymphs Abs: 2.3 10*3/uL (ref 0.7–4.0)
MCH: 31 pg (ref 26.0–34.0)
MCHC: 33.1 g/dL (ref 30.0–36.0)
MCV: 93.6 fL (ref 80.0–100.0)
Monocytes Absolute: 0.9 10*3/uL (ref 0.1–1.0)
Monocytes Relative: 10 %
Neutro Abs: 5 10*3/uL (ref 1.7–7.7)
Neutrophils Relative %: 57 %
Platelets: 375 10*3/uL (ref 150–400)
RBC: 3.29 MIL/uL — ABNORMAL LOW (ref 3.87–5.11)
RDW: 12.7 % (ref 11.5–15.5)
WBC: 8.6 10*3/uL (ref 4.0–10.5)
nRBC: 0 % (ref 0.0–0.2)

## 2021-07-31 LAB — URINALYSIS, ROUTINE W REFLEX MICROSCOPIC
Bilirubin Urine: NEGATIVE
Glucose, UA: NEGATIVE mg/dL
Hgb urine dipstick: NEGATIVE
Ketones, ur: NEGATIVE mg/dL
Leukocytes,Ua: NEGATIVE
Nitrite: NEGATIVE
Protein, ur: NEGATIVE mg/dL
Specific Gravity, Urine: >1.03 — ABNORMAL HIGH (ref 1.005–1.030)
pH: 5.5 (ref 5.0–8.0)

## 2021-07-31 NOTE — Patient Care Conference (Signed)
Inpatient RehabilitationTeam Conference and Plan of Care Update Date: 07/31/2021   Time: 11:49 AM    Patient Name: Kathryn Boyer      Medical Record Number: [id number]  Date of Birth: July 30, 1956 Sex: Female         Room/Bed: 5C02C/5C02C-01 Payor Info: Payor: BLUE CROSS BLUE SHIELD / Plan: BCBS COMM PPO / Product Type: *No Product type* /    Admit Date/Time:  07/27/2021  6:32 PM  Primary Diagnosis:  Critical polytrauma  Hospital Problems: Principal Problem:   Critical polytrauma Active Problems:   Acute blood loss anemia   History of hypertension   Drug induced constipation   Postoperative pain    Expected Discharge Date: Expected Discharge Date: 08/02/21  Team Members Present: Physician leading conference: Dr. Genice Rouge Social Worker Present: Dossie Der, LCSW Nurse Present: Kennyth Arnold, RN PT Present: Bernie Covey, PT OT Present: Earleen Newport, OT PPS Coordinator present : Fae Pippin, SLP     Current Status/Progress Goal Weekly Team Focus  Bowel/Bladder   continent B/B  remain continent  assess toileting needs as needed   Swallow/Nutrition/ Hydration             ADL's   Max A bathing/dressing EOB and bedlevel, Max A toileting, CGA-Min A lateral scoot and slideboard transfers  CGA-Min A overall (goals set for UB dressing, BSC transfer, and grooming only)  D/c planning, pt education, and functional transfers   Mobility   limited by WB restrictions, lateral scoot transfers with supervision, no standing per WB restrictions  supervision transfers and w/c mobility  transfers   Communication             Safety/Cognition/ Behavioral Observations            Pain   reports some pain  <3  assess pain q 4 hr and prn   Skin   incisions and wound  no new skin breakdown  assess skin q shift and prn     Discharge Planning:  Home with husband who can take off but once goes back to work then will hire caregivers. Working on getting DME for home   Team  Discussion: Complains that nursing isn't coming fast enough but remains continent. Labs are okay. Push fluids to stay hydrated. Nursing addressing pain and skin/wound care. Discharging home with husband. Husband to bring in current WC. Asked for shoulder protocol information and received from surgeon. Direct's care well.  Patient on target to meet rehab goals: yes, contact guard to min assist goals. Supervision with PT.  *See Care Plan and progress notes for long and short-term goals.   Revisions to Treatment Plan:  Not at this time.  Teaching Needs: Family education, medication/pain management, skin/wound care, safety awareness, weight bearing precautions, transfer training, etc.  Current Barriers to Discharge: Decreased caregiver support, Home enviroment access/layout, Wound care, Weight, Weight bearing restrictions, and Medication compliance  Possible Resolutions to Barriers: Family education Order DME Follow up PT/OT     Medical Summary Current Status: continent of bowel and bladder- dark urine- poor fluid intake; pain controlled- maintain WB status most of time  Barriers to Discharge: Behavior;Decreased family/caregiver support;Home enviroment access/layout;Weight;Weight bearing restrictions;Wound care;Medical stability;Medication compliance  Barriers to Discharge Comments: directs her care really well; needs a lot of help due to WB restrictions- lateral scoot transfers supervision/CGA. Possible Resolutions to [name]: given protocol of shoulder by surgeon; wants to work on 90 deggrees/sliding board for car transfers- goals- min A to supervsion- OT/PT- d/c Thursday 12/8  Continued Need for Acute Rehabilitation Level of Care: The patient requires daily medical management by a physician with specialized training in physical medicine and rehabilitation for the following reasons: Direction of a multidisciplinary physical rehabilitation program to maximize functional  independence : Yes Medical management of patient stability for increased activity during participation in an intensive rehabilitation regime.: Yes Analysis of laboratory values and/or radiology reports with any subsequent need for medication adjustment and/or medical intervention. : Yes   I attest that I was present, lead the team conference, and concur with the assessment and plan of the team.   Tennis Must 07/31/2021, 4:31 PM

## 2021-07-31 NOTE — Discharge Summary (Signed)
Physician Discharge Summary  Patient ID: Kathryn Boyer MRN: [id number] DOB/AGE: Oct 13, 1955 65 y.o.  Admit date: 07/27/2021 Discharge date: 08/02/2021  Discharge Diagnoses:  Principal Problem:   Critical polytrauma Active Problems:   Acute blood loss anemia   History of hypertension   Drug induced constipation   Postoperative pain Hypothyroidism Hyperlipidemia   Discharged Condition: Stable  Significant Diagnostic Studies: DG Ribs Unilateral W/Chest Left  Result Date: 07/20/2021 CLINICAL DATA:  Left axillary rib pain extending into the anterior chest status post fall. EXAM: LEFT RIBS AND CHEST - 3+ VIEW COMPARISON:  Chest radiograph 06/21/2021 FINDINGS: Cardiomediastinal silhouette and pulmonary vasculature are within normal limits. The lungs are clear. No pneumothorax. Left proximal humerus fracture better seen on dedicated shoulder radiographs. No displaced left rib fractures identified. IMPRESSION: 1. No displaced left rib fracture. 2. Comminuted, impacted left proximal humerus fracture. Electronically Signed   By: Acquanetta Belling M.D.   On: 07/20/2021 13:09   DG Thoracic Spine 2 View  Result Date: 07/20/2021 CLINICAL DATA:  Pain status post fall. EXAM: THORACIC SPINE 2 VIEWS; LUMBAR SPINE - 2-3 VIEW COMPARISON:  Chest radiograph 06/21/2021 FINDINGS: Thoracic spine: Alignment within normal limits. Vertebral body heights are maintained. Minimal endplate spurring seen at multiple levels of the midthoracic spine. Lumbar spine: Alignment within normal limits. No significant vertebral body or disc height loss. Minimal endplate spurring seen at multiple levels. IMPRESSION: No acute radiographic abnormality of the thoracic or lumbar spine. Electronically Signed   By: Acquanetta Belling M.D.   On: 07/20/2021 13:19   DG Lumbar Spine 2-3 Views  Result Date: 07/20/2021 CLINICAL DATA:  Pain status post fall. EXAM: THORACIC SPINE 2 VIEWS; LUMBAR SPINE - 2-3 VIEW COMPARISON:  Chest radiograph 06/21/2021  FINDINGS: Thoracic spine: Alignment within normal limits. Vertebral body heights are maintained. Minimal endplate spurring seen at multiple levels of the midthoracic spine. Lumbar spine: Alignment within normal limits. No significant vertebral body or disc height loss. Minimal endplate spurring seen at multiple levels. IMPRESSION: No acute radiographic abnormality of the thoracic or lumbar spine. Electronically Signed   By: Acquanetta Belling M.D.   On: 07/20/2021 13:19   DG Elbow 2 Views Left  Result Date: 07/20/2021 CLINICAL DATA:  Left elbow pain status post fall. EXAM: LEFT ELBOW - 2 VIEW COMPARISON:  None. FINDINGS: Exam limited due to overlying artifact. No displaced fracture identified. Mild soft tissue swelling seen overlying the olecranon. IMPRESSION: Limited exam.  No displaced fracture identified. Electronically Signed   By: Acquanetta Belling M.D.   On: 07/20/2021 13:10   DG Knee 1-2 Views Left  Result Date: 07/23/2021 CLINICAL DATA:  ORIF for ankle fracture. EXAM: LEFT KNEE - 1-2 VIEW COMPARISON:  None. FINDINGS: 2 intraoperative spot fluoro films obtained at rest and during stress to evaluate the left knee. No gross bony abnormality. IMPRESSION: Intraoperative assessment. Electronically Signed   By: Kennith Center M.D.   On: 07/23/2021 12:03   DG Ankle 2 Views Left  Result Date: 07/20/2021 CLINICAL DATA:  65 year old female with history of trimalleolar left ankle fracture. EXAM: LEFT ANKLE - 2 VIEW COMPARISON:  Earlier the same day FINDINGS: Improved alignment of previously visualized acute trimalleolar fracture with persistent mild lateral displacement of the talus status post casting. IMPRESSION: Improved alignment of previously visualized acute trimalleolar fracture status post casting. Electronically Signed   By: Marliss Coots M.D.   On: 07/20/2021 15:28   DG Ankle Complete Left  Result Date: 07/23/2021 CLINICAL DATA:  Left ankle fracture or dislocation.  EXAM: LEFT ANKLE COMPLETE - 3+ VIEW  COMPARISON:  Fluoroscopic images of same day. FINDINGS: The left ankle has been casted and immobilized. Patient is status post surgical internal fixation of distal left tibial and fibular fractures. IMPRESSION: Status post surgical internal fixation of distal left fibular and tibial fractures. Electronically Signed   By: Lupita Raider M.D.   On: 07/23/2021 14:41   DG Ankle Complete Left  Result Date: 07/23/2021 CLINICAL DATA:  Open reduction internal fixation. EXAM: OPERATIVE left ankle 4 VIEWS TECHNIQUE: Fluoroscopic spot image(s) were submitted for interpretation post-operatively. If the device does not provide the exposure index: Fluoroscopy Time (in minutes and seconds):  59.8 seconds Number of Acquired Images:  4 COMPARISON:  Left ankle in CT and radiographs 07/20/2021. FINDINGS: Lateral plate and screw fixation of the distal fibula noted. Posterior tibial plate screw fixation is present. Two lag screws are evident at the medial malleolus. The ankle is located. No new fractures are present. IMPRESSION: ORIF trimalleolar fracture. No radiographic evidence for complication. Electronically Signed   By: Marin Roberts M.D.   On: 07/23/2021 11:33   DG Ankle Complete Left  Result Date: 07/20/2021 CLINICAL DATA:  Left ankle pain status post fall. EXAM: LEFT ANKLE COMPLETE - 3+ VIEW; LEFT FOOT - COMPLETE 3+ VIEW COMPARISON:  None. FINDINGS: Left ankle: Moderately displaced trimalleolar ankle fracture. There is disruption of the ankle mortise. Large ankle joint effusion. Enthesopathic changes are seen at the insertion of the plantar fascia and Achilles tendon on the calcaneus. Left foot: No fracture or dislocation. 3 x 1 mm radiopaque foreign body seen within the soft tissues along the plantar aspect of the forefoot. IMPRESSION: 1. Moderately displaced trimalleolar left ankle fracture. 2. 3 x 1 mm radiopaque foreign body seen within the soft tissues along the plantar aspect of the forefoot.  Electronically Signed   By: Acquanetta Belling M.D.   On: 07/20/2021 13:24   DG Ankle Complete Right  Result Date: 07/20/2021 CLINICAL DATA:  Right ankle and foot pain status post fall EXAM: RIGHT ANKLE - COMPLETE 3+ VIEW; RIGHT FOOT COMPLETE - 3+ VIEW COMPARISON:  None. FINDINGS: Right ankle: Mild soft tissue swelling overlying the lateral malleolus. There is a subtle lucency within the lateral malleolus best seen on the oblique view which may be a prominent vascular channel versus nondisplaced fracture. Right foot: Mild hallux valgus deformity. Minimally displaced fracture of the base of the fifth metatarsal. Enthesopathic changes are seen at the insertion of the plantar fascia and Achilles tendon on the calcaneus. Nonspecific soft tissue calcifications noted adjacent to the distal third metatarsal. This may be sequelae of remote trauma or represent external artifact. IMPRESSION: 1. Minimally displaced fracture of the base of the fifth metatarsal. 2. Findings suspicious for nondisplaced fracture of the lateral malleolus. Electronically Signed   By: Acquanetta Belling M.D.   On: 07/20/2021 13:16   CT Knee Left Wo Contrast  Result Date: 07/20/2021 CLINICAL DATA:  Knee pain status post fall. Tibial plateau fracture. EXAM: CT OF THE LEFT KNEE WITHOUT CONTRAST TECHNIQUE: Multidetector CT imaging of the left knee was performed according to the standard protocol. Multiplanar CT image reconstructions were also generated. COMPARISON:  Radiographs same date FINDINGS: Bones/Joint/Cartilage As demonstrated on the earlier radiographs, there is a mildly depressed articular intra-articular fracture of the medial tibial plateau posteriorly. There is 3 mm of depression of the articular surface, best seen on the sagittal images. The tibial spines are intact. There is no involvement of the lateral  tibial plateau. The distal femur, patella and proximal fibula are intact. A small lipohemarthrosis is noted. Ligaments Suboptimally  assessed by CT. Muscles and Tendons The extensor mechanism is intact. No focal muscular abnormality identified. Soft tissues No evidence of foreign body, fluid collection or soft tissue emphysema. IMPRESSION: 1. Mildly depressed intra-articular fracture of the medial tibial plateau posteriorly. 2. Associated lipohemarthrosis. 3. Intact lateral tibial plateau and distal femur. Electronically Signed   By: Carey Bullocks M.D.   On: 07/20/2021 16:39   CT SHOULDER LEFT WO CONTRAST  Result Date: 07/21/2021 CLINICAL DATA:  Fall yesterday. Left humeral neck fracture. Preoperative planning. EXAM: CT OF THE UPPER LEFT EXTREMITY WITHOUT CONTRAST TECHNIQUE: Multidetector CT imaging of the left shoulder was performed according to the standard protocol. COMPARISON:  Radiographs 07/20/2021 FINDINGS: Bones/Joint/Cartilage The bones are demineralized. Comminuted fracture of the left humeral neck demonstrates impaction, apex anterior angulation and up to 6 mm of anterior displacement. This fracture involves both tuberosities as well as the superior articular surface of the humeral head. The humeral head remains located. No evidence of glenoid or other scapular fracture. There is a moderate size hemarthrosis. Mild acromioclavicular degenerative changes are present. Ligaments Suboptimally assessed by CT. Muscles and Tendons Grossly intact rotator cuff without evidence of focal muscular atrophy. There is some edema anteriorly in the deltoid muscle without evidence of focal hematoma. Soft tissues Subcutaneous edema anteriorly and laterally consistent with contusion. No evidence of foreign body or soft tissue emphysema. Mild left lung emphysematous changes are noted. IMPRESSION: 1. Comminuted and displaced fracture of the left humeral neck as described. This fracture involves both tuberosities as well as the superior articular surface of the humeral head. 2. No evidence of glenoid or other scapular fracture. 3. Anterior soft tissue  contusion without evidence of focal hematoma. Electronically Signed   By: Carey Bullocks M.D.   On: 07/21/2021 10:06   CT ANKLE LEFT WO CONTRAST  Result Date: 07/20/2021 CLINICAL DATA:  Ankle fracture post reduction. Preoperative planning. EXAM: CT OF THE LEFT ANKLE WITHOUT CONTRAST TECHNIQUE: Multidetector CT imaging of the left ankle was performed according to the standard protocol. Multiplanar CT image reconstructions were also generated. COMPARISON:  Radiographs earlier the same date. FINDINGS: Bones/Joint/Cartilage The ankle is casted. Trimalleolar fracture demonstrates improved alignment compared with the original radiographs. Nearly transverse fracture through the base of the medial malleolus remains minimally displaced laterally. Comminuted fracture of the posterior malleolus demonstrates up to 11 mm of posterior displacement of the articular surface as well as depression of the articular surface by 5 mm. There is a comminuted fracture of the distal fibula with an anteriorly displaced butterfly fragment. No widening of the ankle mortise. The talus is located. The talar dome is intact. No definite tarsal bone fractures are identified. Multicentric os peroneum and accessory navicular noted. Ligaments Suboptimally assessed by CT. Muscles and Tendons The ankle tendons appear intact. The posterior tibialis and flexor digitorum longus tendons are in close proximity to the fracture of the posterior malleolus, but no tendon entrapment identified Soft tissues No foreign body, focal fluid collection or soft tissue emphysema. There is moderate lateral soft tissue swelling. IMPRESSION: 1. Mild residual displacement of the trimalleolar fracture, improved from the original radiographs. 2. No evidence of tarsal bone fracture. 3. The ankle tendons appear intact without entrapment. Electronically Signed   By: Carey Bullocks M.D.   On: 07/20/2021 17:19   DG Shoulder Left  Result Date: 07/20/2021 CLINICAL DATA:   Fall.  Left shoulder deformity  and pain. EXAM: LEFT SHOULDER - 2+ VIEW COMPARISON:  None. FINDINGS: Comminuted, impacted fracture of the left proximal humerus. Visualized soft tissues are unremarkable. IMPRESSION: Comminuted, impacted left proximal humerus fracture. Electronically Signed   By: Acquanetta Belling M.D.   On: 07/20/2021 13:06   DG Shoulder Left Port  Result Date: 08/01/2021 CLINICAL DATA:  Left shoulder arthroplasty EXAM: LEFT SHOULDER COMPARISON:  07/25/2021 FINDINGS: There is left shoulder arthroplasty. There is comminuted displaced fracture in the lateral aspect of proximal left humerus. No significant interval changes are noted since 07/25/2021. IMPRESSION: Left shoulder arthroplasty. Comminuted displaced fracture is seen in the lateral aspect of proximal left humerus. Electronically Signed   By: Ernie Avena M.D.   On: 08/01/2021 09:27   DG Shoulder Left Port  Result Date: 07/25/2021 CLINICAL DATA:  Postoperative imaging for reverse shoulder arthroplasty in a 65 year old female. EXAM: LEFT SHOULDER COMPARISON:  CT of the shoulder of July 21, 2021. FINDINGS: Immediate postoperative imaging of the LEFT shoulder in single view shows interval insertion of a reverse shoulder arthroplasty. Greater tuberosity fragment is seen along the superior and lateral margin of the bone prosthetic interface in the humerus. Shoulder component appears well positioned based on projection without signs of visible fracture in AP projection. Lung volumes incidentally appears a low and are incompletely evaluated in the chest. IMPRESSION: Post reverse shoulder arthroplasty with expected postoperative changes. Fracture fragment of greater tuberosity along the superior margin of the humeral component near the prosthetic interface. Electronically Signed   By: Donzetta Kohut M.D.   On: 07/25/2021 14:27   DG Hand Complete Left  Result Date: 07/20/2021 CLINICAL DATA:  Left hand pain status post fall. EXAM: LEFT  HAND - COMPLETE 3+ VIEW COMPARISON:  None. FINDINGS: Deformity of the scaphoid most likely sequelae of prior trauma. Otherwise no acute fracture or dislocation. Mild degenerative changes seen at the first carpometacarpal joint. IMPRESSION: Deformity of the scaphoid most likely sequelae of remote trauma. If the patient has snuffbox tenderness, further evaluation with dedicated wrist radiographs should be obtained. Electronically Signed   By: Acquanetta Belling M.D.   On: 07/20/2021 13:12   DG Foot Complete Left  Result Date: 07/20/2021 CLINICAL DATA:  Left ankle pain status post fall. EXAM: LEFT ANKLE COMPLETE - 3+ VIEW; LEFT FOOT - COMPLETE 3+ VIEW COMPARISON:  None. FINDINGS: Left ankle: Moderately displaced trimalleolar ankle fracture. There is disruption of the ankle mortise. Large ankle joint effusion. Enthesopathic changes are seen at the insertion of the plantar fascia and Achilles tendon on the calcaneus. Left foot: No fracture or dislocation. 3 x 1 mm radiopaque foreign body seen within the soft tissues along the plantar aspect of the forefoot. IMPRESSION: 1. Moderately displaced trimalleolar left ankle fracture. 2. 3 x 1 mm radiopaque foreign body seen within the soft tissues along the plantar aspect of the forefoot. Electronically Signed   By: Acquanetta Belling M.D.   On: 07/20/2021 13:24   DG Foot Complete Right  Result Date: 07/20/2021 CLINICAL DATA:  Right ankle and foot pain status post fall EXAM: RIGHT ANKLE - COMPLETE 3+ VIEW; RIGHT FOOT COMPLETE - 3+ VIEW COMPARISON:  None. FINDINGS: Right ankle: Mild soft tissue swelling overlying the lateral malleolus. There is a subtle lucency within the lateral malleolus best seen on the oblique view which may be a prominent vascular channel versus nondisplaced fracture. Right foot: Mild hallux valgus deformity. Minimally displaced fracture of the base of the fifth metatarsal. Enthesopathic changes are seen at the insertion of  the plantar fascia and Achilles  tendon on the calcaneus. Nonspecific soft tissue calcifications noted adjacent to the distal third metatarsal. This may be sequelae of remote trauma or represent external artifact. IMPRESSION: 1. Minimally displaced fracture of the base of the fifth metatarsal. 2. Findings suspicious for nondisplaced fracture of the lateral malleolus. Electronically Signed   By: Acquanetta Belling M.D.   On: 07/20/2021 13:16   DG C-Arm 1-60 Min-No Report  Result Date: 07/23/2021 Fluoroscopy was utilized by the requesting physician.  No radiographic interpretation.   DG C-Arm 1-60 Min-No Report  Result Date: 07/23/2021 Fluoroscopy was utilized by the requesting physician.  No radiographic interpretation.   DG Knee 3 Views Left  Result Date: 07/20/2021 CLINICAL DATA:  Pain status post fall EXAM: LEFT KNEE - 3 VIEW COMPARISON:  None. FINDINGS: Minimally depressed fracture of the posterior lateral tibial plateau. No additional fracture or dislocation. Atherosclerotic changes seen throughout visualized arterial segments. IMPRESSION: Minimally depressed fracture of the posterolateral tibial plateau. Electronically Signed   By: Acquanetta Belling M.D.   On: 07/20/2021 13:20    Labs:  Basic Metabolic Panel: Recent Labs  Lab 07/26/21 0837 07/27/21 0552  NA 139  --   K 3.7  --   CL 103  --   CO2 27  --   GLUCOSE 104*  --   BUN 11  --   CREATININE 0.93 0.92  CALCIUM 8.6*  --     CBC: Recent Labs  Lab 07/26/21 0837 07/31/21 0535  WBC 9.4 8.6  NEUTROABS  --  5.0  HGB 10.4* 10.2*  HCT 31.0* 30.8*  MCV 91.4 93.6  PLT 267 375    CBG: No results for input(s): GLUCAP in the last 168 hours.  Family history.  Mother with CAD CVA hypertension and ovarian cancer.  Father with aneurysm hypertension CAD and kidney disease.  Denies any colon cancer esophageal cancer or rectal cancer  Brief HPI:   Kathryn Boyer is a 65 y.o. right-handed female presented 07/20/2021 after going down a landing when she misplaced her foot  landing on her left shoulder twisting her left ankle violently.  She immediately complained of pain in both feet and ankles as well as left shoulder.  No loss of consciousness.  Patient sustained left depressed medial tibial plateau fracture left trimalleolar ankle fracture dislocation and ruptured syndesmosis, left proximal humerus fracture right nondisplaced lateral malleolus fracture right fifth metatarsal base fracture.  Initially seen at [facility] given the complexity of fractures with symptom [facility] hospital for further care.  On 07/23/2021 underwent open external fixation of left bimalleolar ankle fracture, open reduction internal fixation syndesmosis, closed treatment of left medial condyle fracture of the tibia by Dr. Carola Frost.  It was felt the left split proximal humerus fracture was unreconstructable.  Dr. Everardo Pacific consulted underwent left reverse total shoulder arthroplasty 07/25/2021.  Nonweightbearing left upper extremity okay to use arm for ADLs at waist level.  Sling at all times except for ADLs and waist level exercises.  Patient nonweightbearing x8 weeks left lower extremity in splint.  Plan was to convert patient to cam boot in 2 weeks.  Weightbearing as tolerated right lower extremity in postop shoe.  Lovenox for DVT prophylaxis transition to Xarelto.  Therapy evaluations completed due to patient decreased functional mobility was admitted for a comprehensive rehab program.   Hospital Course: Bernese Doffing was admitted to rehab 07/27/2021 for inpatient therapies to consist of PT, ST and OT at least three hours five  days a week. Past admission physiatrist, therapy team and rehab RN have worked together to provide customized collaborative inpatient rehab.  Pertain to patient's multitrauma after mechanical fall including left tibial plateau fracture left trimalleolar fracture with syndesmosis rupture as well as left proximal humerus fracture which required reverse TSA and right  distal fib fracture and fifth metatarsal fracture.  Nonweightbearing left lower extremity x8 weeks conversion to cam boot in 2 weeks.  Nonweightbearing left upper extremity May use left arm for ADLs at waist level.  Weightbearing as tolerated right lower extremity postop shoe for transfers.  Xarelto for DVT prophylaxis no bleeding episodes.  Pain managed with use of Robaxin Naprosyn as well as scheduled Neurontin with oxycodone for breakthrough pain.  Hypothyroidism with hormone supplement as directed.  Bouts of constipation resolved with laxative assistance.  History of osteoporosis calcium vitamin D as directed.  Hyperlipidemia pravastatin as directed.  Blood pressure controlled and monitored.  Acute blood loss anemia stable latest hemoglobin 10.2.   Blood pressures were monitored on TID basis and controlled     Rehab course: During patient's stay in rehab weekly team conferences were held to monitor patient's progress, set goals and discuss barriers to discharge. At admission, patient required minimal assist supine to sit minimal assist sit to supine minimal assist lateral scoot transfers  Physical exam.  Blood pressure 110/66 pulse 81 temperature 92 respirations 18 oxygen saturation 94% room air Constitutional.  No acute distress HEENT Head.  Normocephalic and atraumatic Eyes.  Pupils round and reactive to light no discharge.nystagmus Neck.  Supple nontender no JVD without thyromegaly Cardiac regular rate rhythm any extra sounds or murmur heard Abdomen.  Soft nontender positive bowel sounds without rebound Musculoskeletal.  Left upper extremity in sling proximal arm with bruising and significant swelling.  Left lower extremity in splint toes with mild swelling.  Right upper extremity mild generalized swelling and tenderness Skin.  Bruising proximal left upper extremity operative incision dressed with postoperative dressing.  Substantial bruising around right ankle and proximal  foot. Neurologic.  Alert oriented x3 normal insight and awareness.  Right upper extremity 5/5 right lower extremity 3 -/4/5 able to move left leg side to side and wiggle toes with splint.  He/She  has had improvement in activity tolerance, balance, postural control as well as ability to compensate for deficits. He/She has had improvement in functional use RUE/LUE  and RLE/LLE as well as improvement in awareness.  Sessions focused on education with family weightbearing precautions.  Required assist for clothing management set up for hygiene.  Supervision lateral scoot transfers.  Continent of urine with voiding.  Nonweightbearing left upper extremity weightbearing as tolerated right lower extremity nonweightbearing left lower extremity.  She transferred to recliner to complete ADLs lateral scoot given contact-guard minimal assist.  Full family teaching completed plan discharged to home       Disposition: Discharged to home   Diet: Regular  Special Instructions: No driving smoking or alcohol  Nonweightbearing left upper extremity.  Weightbearing as tolerated right lower extremity.  Nonweightbearing left lower extremity.   Medications at discharge 1.  Tylenol as needed 2.  Xanax 0.5 mg p.o. 3 times daily as needed 3.  Vitamin C 500 mg p.o. daily 4.  Calcium citrate 200 mg p.o. twice daily 5.  Vitamin D 2000 units p.o. twice daily 6.  Neurontin 100 mg p.o. 3 times daily 7.  Synthroid 100 mcg p.o. daily 8.  Robaxin-750 milligram p.o. 3 times daily 9.  Naprosyn 250 mg  p.o. twice daily 10.  Oxycodone 5 to 10 mg every 4 hours as needed pain 11.  Pepcid 20 mg twice daily 12.  MiraLAX twice daily hold for loose stools 13.  Pravachol 20 mg p.o. daily 14.  Xarelto 10 mg p.o. daily 15.  Senokot S2 tabs p.o. twice daily 16.  Estrace vaginal cream 3 times a week 17.  Estradiol patch 2 times weekly 18.  Singulair 10 mg nightly 19.  Benicar 40 mg daily 20.  Actonel 35 mg every 7 days with  water 21.  Ventolin inhaler 2 puffs every 4 hours as needed 22.  Vitamin D 50,000 units every 7 days   30-35 minutes spent completing discharge summary and discharge planning     Follow-up Information     Lovorn, Aundra Millet, MD Follow up.   Specialty: Physical Medicine and Rehabilitation Why: No formal follow up needed Contact information: [address] [place] [address] [phone]         Myrene Galas, MD Follow up.   Specialty: Orthopedic Surgery Why: call for appointment Contact information: [address] [place] [address] [phone]         Bjorn Pippin, MD Follow up.   Specialty: Orthopedic Surgery Why: call for appointment Contact information: [address] [place] [address] [phone]                 Signed: Charlton Amor 08/02/2021, 5:37 AM

## 2021-07-31 NOTE — Progress Notes (Signed)
[place] PHYSICAL MEDICINE & REHABILITATION PROGRESS NOTE  Subjective/Complaints:   Pt reports having soft BM's since disimpaction 2 nights ago.   Ready to go home- said there's not much they can work on here, and thinks needs to leave.    ROS:  Pt denies SOB, abd pain, CP, N/V/C/D, and vision changes   Objective: Vital Signs: Blood pressure 114/60, pulse 77, temperature 98.2 F (36.8 C), resp. rate 18, height 5\' 7"  (1.702 m), weight 86.9 kg, SpO2 96 %. No results found. Recent Labs    07/31/21 0535  WBC 8.6  HGB 10.2*  HCT 30.8*  PLT 375   No results for input(s): NA, K, CL, CO2, GLUCOSE, BUN, CREATININE, CALCIUM in the last 72 hours.   Intake/Output Summary (Last 24 hours) at 07/31/2021 0811 Last data filed at 07/30/2021 1900 Gross per 24 hour  Intake 474 ml  Output --  Net 474 ml        Physical Exam: BP 114/60 (BP Location: Left Arm)   Pulse 77   Temp 98.2 F (36.8 C)   Resp 18   Ht 5\' 7"  (1.702 m)   Wt 86.9 kg   SpO2 96%   BMI 30.01 kg/m    General: awake, alert, appropriate,  sitting up in bed; wearing sling on LUE; NAD HENT: conjugate gaze; oropharynx moist CV: regular rate; no JVD Pulmonary: CTA B/L; no W/R/R- good air movement GI: soft, NT, ND, (+)BS- hypoactive slightly.  Psychiatric: appropriate- interactive Neurological: Ox3  Musc: Left upper extremity in sling.  Edema noted. Left lower extremity in splint with edema Right upper extremity with edema and tenderness Neuro: Alert  Assessment/Plan: 1. Functional deficits which require 3+ hours per day of interdisciplinary therapy in a comprehensive inpatient rehab setting. Physiatrist is providing close team supervision and 24 hour management of active medical problems listed below. Physiatrist and rehab team continue to assess barriers to discharge/monitor patient progress toward functional and medical goals   Care Tool:  Bathing  Bathing activity did not occur:  (not assessed due  to time constraints) Body parts bathed by patient: Front perineal area, Face   Body parts bathed by helper: Buttocks     Bathing assist Assist Level: Minimal Assistance - Patient > 75%     Upper Body Dressing/Undressing Upper body dressing   What is the patient wearing?: Pull over shirt    Upper body assist Assist Level: Maximal Assistance - Patient 25 - 49%    Lower Body Dressing/Undressing Lower body dressing      What is the patient wearing?: Pants     Lower body assist Assist for lower body dressing: Maximal Assistance - Patient 25 - 49%     Toileting Toileting Toileting Activity did not occur (Clothing management and hygiene only): N/A (no void or bm)  Toileting assist Assist for toileting: Maximal Assistance - Patient 25 - 49%     Transfers Chair/bed transfer  Transfers assist     Chair/bed transfer assist level: Supervision/Verbal cueing (lateral scoot)     Locomotion Ambulation   Ambulation assist   Ambulation activity did not occur: Safety/medical concerns          Walk 10 feet activity   Assist  Walk 10 feet activity did not occur: Safety/medical concerns        Walk 50 feet activity   Assist Walk 50 feet with 2 turns activity did not occur: Safety/medical concerns         Walk 150 feet activity  Assist Walk 150 feet activity did not occur: Safety/medical concerns         Walk 10 feet on uneven surface  activity   Assist Walk 10 feet on uneven surfaces activity did not occur: Safety/medical concerns         Wheelchair     Assist Is the patient using a wheelchair?: Yes Type of Wheelchair: Manual    Wheelchair assist level: Total Assistance - Patient < 25% Max wheelchair distance: 20 ft    Wheelchair 50 feet with 2 turns activity    Assist        Assist Level: Total Assistance - Patient < 25%   Wheelchair 150 feet activity     Assist      Assist Level: Total Assistance - Patient < 25%     Medical Problem List and Plan: 1. Functional deficits secondary to polytrauma including left tibial plateau fx and left trimalleolar fx with syndemosis rupture as well as left proximal humerus fx which required reverse TSA and right distal fib fx and 5th MT fx             -NWB LLE x 8 weeks--convert to CAM in 2 weeks             -NWB LUE-may use left arm for ADL's at waist level             -WBAT RLE with post-op shoe for transfers Con't CIR_ PT and OT- team conference today to determine length of stay- pt focused on the 5-7 days.  2.  Antithrombotics: -DVT/anticoagulation:  Xarelto 10mg  daily             -antiplatelet therapy: n/a 3. Pain Management: oxycodone prn for moderate to severe pain,               -tylenol prn for mild pain             -robaxin 750mg  tid for muscle spasms             -naproxen 250mg  bid with meals             -gabapentin 100mg  tid             -ice prn  12/6- pain controlled- con't regimen  Monitor with increased exertion 4. Mood/generalized anxiety disorder             -team to provide ego support as necessary             -antipsychotic agents: n/a             -transition to xanax which she was using PRN at home (being given valium on acute)             -trazodone prn for sleep  ?Improving 5. Neuropsych: This patient is capable of making decisions on her own behalf. 6. Skin/Wound Care: continue post-op dressing to left shoulder.              -pt to remain in splint LLE for 2 weeks with transition to CAM boot 7. Fluids/Electrolytes/Nutrition: encourage appropriate PO, I/O balance - .  Suspect dark urine is related to urine concentration due to neg I/O, Check UA  12/6 - U/A (-) for UTI, however spec grav is pretty high- will push PO fluids.  8.  Drug-induced constipation             Scheduled miralax and senna-s 2 tabs at bedtime  Bowel meds increased on 12/3  12/6- Now having  more regular BM's- con't regimen  9. Hypothyroidism              -Synthroid daily 10. Bones/hx of osteoporosis: calcium and vitamin d daily             -updated DEXA and further rx as outpt              -pt was on actonel PTA 11. Hyperlipidemia: pt on pravastatin and olmesartan at home             -continue pravastatin for now per acute regimen 12. Hx of HTN: controlled without meds currently  Monitor the increased mobility 13. Bladder: oob to void when possible. Can use purewick if needed at HS initially 14.  Acute blood loss anemia  Hemoglobin 10.4 on 12/1, labs ordered for 12/6  12/6- Stable Hb at 10.2    LOS: 4 days A FACE TO FACE EVALUATION WAS PERFORMED  Kathryn Boyer 07/31/2021, 8:11 AM

## 2021-07-31 NOTE — Progress Notes (Signed)
Patient is being D/C 12/8. Educated patent to continue and take scheduled medications. Monitor bowel movements due to constipation from opioid medications. Continue to encourage fluids.

## 2021-07-31 NOTE — Progress Notes (Signed)
Occupational Therapy Session Note  Patient Details  Name: Kathryn Boyer MRN: [id number] Date of Birth: 08/12/1956  Today's Date: 07/31/2021 OT Individual Time: [phone] and [id number] OT Individual Time Calculation (min): 44 min and 40 min   Short Term Goals: Week 1:  OT Short Term Goal 1 (Week 1): STGs=LTGs due to ELOS  Skilled Therapeutic Interventions/Progress Updates:    Session 1: Pt greeted at time of session sitting up in recliner, agreeable to OT session and no reports of pain except during mobility, pain not rated and did not impact session. Pt wanting to get dressed, doffed tank top and donned new tank/long sleeve shirt with Max A overall. Pt needing increased physical assist and cues for sequencing how to don/doff with ease and within shoulder precautions. Donned pants with Max A overall, able to bring feet up to self with hip/knee flexion and modified figure four but needed assist to get over cast. Pt able to perform lateral leans without assist but needed assist to pull pants up over hips. Therapist assisting with joy ultra sling and pt with good recall for placement of straps and abduction pillow. Relayed to the pt that she can sleep with joy ultra sling or envelope sling with shoulder supported with pillow to prevent extension per convo with surgical PA. Provided pt as well with reverse total shoulder protocol per surgeon. Pt up in chair at end of session alarm on call bell in reach.   Session 2: Pt greeted at time of session up in recliner with husband present who did not remain for session. No pain reported. Discussion with pt at beginning of session regarding her questions about a ramp as she has an unusual set up for stairs at home with a landing between stairs and a wrap around drive way, recommend that will need to consult with PT. OT demonstration and cues throughout session for clearance of buttocks and encouraging anterior weight shifting for decreased shearing and friction on  buttocks. Squat pivot/lateral scoot recliner > wheelchair Min/CGA and transported to 4th floor ADL apartment. Squat pivot toward R side wheelchair > recliner with Min/CGA and slide board back to wheelchair Min A. Cues for form and body mechanics. Back up in room, slide board > recliner Min guard. Up in recliner alarm on call bell in reach. Note pt with several questions throughout session for DC planning and HH follow up which were answered.    Therapy Documentation Precautions:  Precautions Precautions: Fall, Shoulder Type of Shoulder Precautions: Conservative shoulder protocol: no PROM/AROM. Cleared for ROM of L hand, wrist, and elbow. Shoulder Interventions: At all times, Off for dressing/bathing/exercises, Don joy ultra sling, Shoulder abduction pillow Precaution Comments: Reviewed shoulder precautions Required Braces or Orthoses: Other Brace, Splint/Cast Splint/Cast: LLE for 2 weeks then CAM boot, NWB for 8 weeks Other Brace: post op shoe RLE, WBAT Restrictions Weight Bearing Restrictions: Yes LUE Weight Bearing: Non weight bearing RLE Weight Bearing: Weight bearing as tolerated LLE Weight Bearing: Non weight bearing Other Position/Activity Restrictions: No restrictions knee ROM on left     Therapy/Group: Individual Therapy  Erasmo Score 07/31/2021, 7:17 AM

## 2021-07-31 NOTE — Progress Notes (Signed)
Patient ID: Kathryn Boyer, female   DOB: 1955/09/10, 65 y.o.   MRN: [id number]   Diagnosis code:T07.Lorne Skeens, M81.) B28.413K, S42.309A  Height: 5'7  Weight: 177   Patient has multiple fractures of lower legs and shoulder due to fall at home which requires her body to be positioned in ways not feasible with a normal bed. Her legs and shoulder require frequent and immediate changes in body position which cannot be achieved with a normal bed. Length of need 6 months

## 2021-07-31 NOTE — Progress Notes (Signed)
Physical Therapy Session Note  Patient Details  Name: Kathryn Boyer MRN: [id number] Date of Birth: Oct 28, 1955  Today's Date: 07/31/2021 PT Individual Time: 1315-1415 PT Individual Time Calculation (min): 60 min   Short Term Goals: Week 1:  PT Short Term Goal 1 (Week 1): STG = LTG d/t short ELOS  Skilled Therapeutic Interventions/Progress Updates:  Pt received in recliner and agreeable to therapy.  No immediate complaint of pain. Pt requested to use the [facility]. Set up at 90 deg angle with chair arm in place to mimic home environment. Pt performed with supervision after set up cueing. Continent bladder void, min A to pull pants over L hip, otherwise supervision for toileting tasks. Lateral scoot transfer with supervision to wheelchair with supervision and set up assist. Pt then propelled w/c with RLE x 50 ft. Pt had difficulty coordinating to steer with LE and propel with UE and would benefit from continued practice. Pt then transported to therapy gym for time. Pt performed car transfer with min A to place board and instructional cueing. Performed transfer both as if sliding into back seat or passenger seat. Pt returned to room and performed slideboard transfer to recliner toward left side with assist for w/c arm and board placement. Pt was left with all needs in reach and alarm active.      Therapy Documentation Precautions:  Precautions Precautions: Fall, Shoulder Type of Shoulder Precautions: Conservative shoulder protocol: no PROM/AROM. Cleared for ROM of L hand, wrist, and elbow. Shoulder Interventions: At all times, Off for dressing/bathing/exercises, Don joy ultra sling, Shoulder abduction pillow Precaution Comments: Reviewed shoulder precautions Required Braces or Orthoses: Other Brace, Splint/Cast Splint/Cast: LLE for 2 weeks then CAM boot, NWB for 8 weeks Other Brace: post op shoe RLE, WBAT Restrictions Weight Bearing Restrictions: Yes LUE Weight Bearing: Non weight bearing RLE  Weight Bearing: Weight bearing as tolerated LLE Weight Bearing: Non weight bearing Other Position/Activity Restrictions: No restrictions knee ROM on left    Therapy/Group: Individual Therapy  Juluis Rainier 07/31/2021, 1:32 PM

## 2021-07-31 NOTE — Progress Notes (Addendum)
Patient ID: Kathryn Boyer, female   DOB: [birth year], 65 y.o.   MRN: [id number]  Met with pt to discuss team conference goals of supervision-CGA-Min assist with ADL's due to WB issues and ROM. She had asked to leave ASAP, discharge date set for 12/8. She now says may be better Friday but will talk with MD regarding this. Discussed equipment needs and inability to get fully electric hospital bed, will get semi-electric bed and she will continue to work on getting fully electric bed. Also discuss wheelchair, 30 transfer board and bariatric drop-arm-made referral to [employer] to set up delivery with pt or husband hopefully by tomorrow. Pt asked about therapies, will see if team recommends any due to WB issues and ROM with arm very limited. Pt has hired a Engineer, drilling for her care. Pt to see if her friend has a bariatric drop-arm otherwise she will pay for the one from [employer]. Aware she will need to call them to pay prior to receiving equipment. She has their number. Continue to work on discharge needs. May need husband to come in regarding getting her into their home.  3;20 PM Husband in room and reports has a ramp for three steps and will just move it once she is up three steps for the next three steps. Went over again the equipment and cost of non-covered items. Will ask [employer] to call pt again to discuss cost and to receive payment prior to delivery. Pt asked again about discharge date being better for Friday, up to MD. Pt to talk with her in the am.

## 2021-07-31 NOTE — Progress Notes (Signed)
Occupational Therapy Session Note  Patient Details  Name: Kathryn Boyer MRN: [id number] Date of Birth: 03-Jul-1956  Today's Date: 07/31/2021 OT Individual Time: 1005-1105 OT Individual Time Calculation (min): 60 min    Short Term Goals: Week 1:  OT Short Term Goal 1 (Week 1): STGs=LTGs due to ELOS Week 2:    Week 3:     Skilled Therapeutic Interventions/Progress Updates:    The pt was seated in the recliner upon arrival, she completed a functional transfer from the recliner to the [facility], incorporating the transfer board with ModA adhering to precautionary measure for the LLE.  The pt was a to doff her LB garments with MinA for positioning and clothing management. The pt was able to complete toileting with s/u.  The pt was able to donn LB garment with ModA secondary to limitation in mobility and strength.  The pt was able to transfer from the [facility] to the w/c with MinA, incorporating the sliding board for increase safety and independence. The pt was instructed in AE for long hand sponge and a long handle reacher for greater independence and safety.  The pt returned to the recliner with her bedside table, and call bell within reach and all additional needs addressed.  The pt had no c/o pain during this treatment session.  Therapy Documentation Precautions:  Precautions Precautions: Fall, Shoulder Type of Shoulder Precautions: Conservative shoulder protocol: no PROM/AROM. Cleared for ROM of L hand, wrist, and elbow. Shoulder Interventions: At all times, Off for dressing/bathing/exercises, Don joy ultra sling, Shoulder abduction pillow Precaution Comments: Reviewed shoulder precautions Required Braces or Orthoses: Other Brace, Splint/Cast Splint/Cast: LLE for 2 weeks then CAM boot, NWB for 8 weeks Other Brace: post op shoe RLE, WBAT Restrictions Weight Bearing Restrictions: Yes LUE Weight Bearing: Non weight bearing RLE Weight Bearing: Weight bearing as tolerated LLE Weight Bearing: Non weight  bearing Other Position/Activity Restrictions: No restrictions knee ROM on left General:   Vital Signs:   Pain: Pain Assessment Pain Score: 2  Pain Descriptors / Indicators: Aching;Shooting;Sharp Pain Intervention(s): Pain med given for lower pain score than stated, per patient request;Medication (See eMAR);Cold applied;Repositioned 3rd Pain Site Patient's Stated Pain Goal: 2 ADL: Vision   Perception    Praxis   Balance   Exercises:   Other Treatments:     Therapy/Group: Individual Therapy  Lavona Mound 07/31/2021, 12:56 PM

## 2021-08-01 ENCOUNTER — Inpatient Hospital Stay (HOSPITAL_COMMUNITY): Payer: BC Managed Care – PPO

## 2021-08-01 MED ORDER — VITAMIN D3 25 MCG PO TABS: 2000.0000 [IU] | ORAL_TABLET | Freq: Two times a day (BID) | ORAL | 0 refills | Status: DC

## 2021-08-01 MED ORDER — POLYETHYLENE GLYCOL 3350 17 G PO PACK: 17.0000 g | PACK | Freq: Two times a day (BID) | ORAL | 0 refills | Status: DC

## 2021-08-01 MED ORDER — RIVAROXABAN 10 MG PO TABS: 10.0000 mg | ORAL_TABLET | Freq: Every day | ORAL | 0 refills | Status: DC

## 2021-08-01 MED ORDER — ACETAMINOPHEN 325 MG PO TABS: 325.0000 mg | ORAL_TABLET | Freq: Four times a day (QID) | ORAL | Status: DC | PRN

## 2021-08-01 MED ORDER — OXYCODONE HCL 5 MG PO TABS: 5.0000 mg | ORAL_TABLET | ORAL | 0 refills | Status: DC | PRN

## 2021-08-01 MED ORDER — VITAMIN D (ERGOCALCIFEROL) 1.25 MG (50000 UNIT) PO CAPS: 50000.0000 [IU] | ORAL_CAPSULE | ORAL | 1 refills | Status: DC

## 2021-08-01 MED ORDER — NAPROXEN 250 MG PO TABS: 250.0000 mg | ORAL_TABLET | Freq: Two times a day (BID) | ORAL | 0 refills | Status: DC

## 2021-08-01 MED ORDER — CALCIUM CITRATE 950 (200 CA) MG PO TABS: 200.0000 mg | ORAL_TABLET | Freq: Two times a day (BID) | ORAL | 0 refills | Status: DC

## 2021-08-01 MED ORDER — SENNOSIDES-DOCUSATE SODIUM 8.6-50 MG PO TABS: 2.0000 | ORAL_TABLET | Freq: Two times a day (BID) | ORAL | Status: DC

## 2021-08-01 MED ORDER — FAMOTIDINE 20 MG PO TABS: 20.0000 mg | ORAL_TABLET | Freq: Two times a day (BID) | ORAL | 1 refills | Status: DC

## 2021-08-01 MED ORDER — GABAPENTIN 100 MG PO CAPS: 100.0000 mg | ORAL_CAPSULE | Freq: Three times a day (TID) | ORAL | 0 refills | Status: DC

## 2021-08-01 MED ORDER — ALPRAZOLAM 0.5 MG PO TABS: 0.5000 mg | ORAL_TABLET | Freq: Three times a day (TID) | ORAL | 0 refills | Status: DC | PRN

## 2021-08-01 MED ORDER — ASCORBIC ACID 500 MG PO TABS: 500.0000 mg | ORAL_TABLET | Freq: Every day | ORAL | 0 refills | Status: DC

## 2021-08-01 MED ORDER — METHOCARBAMOL 750 MG PO TABS: 750.0000 mg | ORAL_TABLET | Freq: Three times a day (TID) | ORAL | 0 refills | Status: DC

## 2021-08-01 NOTE — Progress Notes (Signed)
Physical Therapy Session Note  Patient Details  Name: Kathryn Boyer MRN: [id number] Date of Birth: [birth year]  Today's Date: 08/01/2021 PT Individual Time: 1420-1517 PT Individual Time Calculation (min): 57 min   Short Term Goals: Week 1:  PT Short Term Goal 1 (Week 1): STG = LTG d/t short ELOS Week 2:    Week 3:     Skilled Therapeutic Interventions/Progress Updates:   Pt initially oob in recliner and requesting assist to [facility].  Lateral scoot drop arm recliner to drop arm BSC w/supervision, min assist to lower pants.  Pt continent of bladder, performs hygiene w/set up, perineal bathing w/set up on BSC.   Min assit for raising pants in sitting. Lateral scoot to recliner as above. Pt w/questions regarding HHPT and management of shoulder.  Reviewed Lynnette Caffey TSR protocol located in pt info booklet.  Explained ROM restrictions and provided followup questions/instructed pt to call surgeon's office for further guidance including: When pt can begin pendulum exercises (seated) If office has TSR patient education booklet they can provide Therapist assisted pt w/the following exercises PROM at elbow, AROM wrist/hand, limited supination and elbow extension to painfree range - biceps tenodesis prec?   Elbow flex/ext PROM AROM finger flex/ext, various grasps performed Wrist flex/ext Forearm pronation/supination Wrist ulnar/radial deviation  Pt provided w/icepack, discussed use x  Pt left oob in recliner w/needs in reach.   Therapy Documentation Precautions:  Precautions Precautions: Fall, Shoulder Type of Shoulder Precautions: Conservative shoulder protocol: no PROM/AROM. Cleared for ROM of L hand, wrist, and elbow. Shoulder Interventions: At all times, Off for dressing/bathing/exercises, Don joy ultra sling, Shoulder abduction pillow Precaution Comments: Reviewed shoulder precautions Required Braces or Orthoses: Other Brace, Splint/Cast Splint/Cast: LLE for 2 weeks then CAM  boot, NWB for 8 weeks Other Brace: post op shoe RLE, WBAT Restrictions Weight Bearing Restrictions: Yes LUE Weight Bearing: Non weight bearing RLE Weight Bearing: Weight bearing as tolerated LLE Weight Bearing: Non weight bearing Other Position/Activity Restrictions: No restrictions knee ROM on left General:   Vital Signs: Therapy Vitals Temp: 98 F (36.7 C) Temp Source: Oral Pulse Rate: 69 Resp: 19 BP: 116/68 Patient Position (if appropriate): Sitting Oxygen Therapy SpO2: 97 % O2 Device: Room Air Pain:   Mobility:   Locomotion :    Trunk/Postural Assessment :    Balance:   Exercises:   Other Treatments:      Therapy/Group: Individual Therapy Rada Hay, PT   Shearon Balo 08/01/2021, 3:28 PM

## 2021-08-01 NOTE — Progress Notes (Signed)
Occupational Therapy Discharge Summary  Patient Details  Name: Kathryn Boyer MRN: [id number] Date of Birth: 05/02/1956    Patient has met 2 of 3 long term goals due to improved activity tolerance, improved balance, postural control, ability to compensate for deficits, improved awareness, and improved coordination.  Patient to discharge at [facility] Assist level.  Patient's care partner is independent to provide the necessary physical assistance at discharge.  Pt is limited 2/2 multiple fractures and WB and ROM restrictions. Pt is NWB on LUE with sling at all times, abduction pillow to be worn and pillow at night to prevent extension. NWB on LLE with cast, and WBAT on RLE with surgical shoe. Pt needs Min-Mod A with self care tasks 2/2 these restrictions. Pt is able to direct her care very well and declined family training. Recommending 24/7 due to need for physical assist. Pt is CGA/Supervision with lateral scoot transfers and slide board transfers.   Reasons goals not met: Pt did not meet UB dressing goal as she needs Mod A to complete.  Recommendation:  Patient will benefit from ongoing skilled OT services in home health setting initially and transition to [place] to continue to advance functional skills in the area of BADL, iADL, and Reduce care partner burden.  Equipment: Bariatric DABSC  Reasons for discharge: treatment goals met and discharge from hospital  Patient/family agrees with progress made and goals achieved: Yes  OT Discharge Precautions/Restrictions  Precautions Precautions: Fall;Shoulder Type of Shoulder Precautions: Conservative shoulder protocol: no PROM/AROM. Cleared for ROM of L hand, wrist, and elbow. Shoulder Interventions: At all times;Off for dressing/bathing/exercises;Don joy ultra sling;Shoulder abduction pillow Precaution Comments: Reviewed shoulder precautions Required Braces or Orthoses: Other Brace;Splint/Cast Splint/Cast: LLE for 2 weeks then CAM boot, NWB  for 8 weeks Other Brace: post op shoe RLE, WBAT Restrictions Weight Bearing Restrictions: Yes LUE Weight Bearing: Non weight bearing RLE Weight Bearing: Weight bearing as tolerated LLE Weight Bearing: Non weight bearing Other Position/Activity Restrictions: No restrictions knee ROM on left Vital Signs Therapy Vitals Temp: 98 F (36.7 C) Temp Source: Oral Pulse Rate: 69 Resp: 19 BP: 116/68 Patient Position (if appropriate): Sitting Oxygen Therapy SpO2: 97 % O2 Device: Room Air Pain Pain Assessment Pain Scale: 0-10 Pain Score: 0-No pain ADL ADL Eating: Set up Grooming: Minimal assistance Where Assessed-Grooming: Bed level Upper Body Bathing: Minimal assistance Lower Body Bathing: Minimal assistance Upper Body Dressing: Moderate assistance Where Assessed-Upper Body Dressing: Edge of bed Lower Body Dressing: Maximal assistance Where Assessed-Lower Body Dressing: Bed level Toileting: Moderate assistance Where Assessed-Toileting: Bedside Commode Toilet Transfer: Therapist, music Method: Engineer, water: Drop arm bedside commode Tub/Shower Transfer: Not assessed Vision Baseline Vision/History: 0 No visual deficits Patient Visual Report: No change from baseline Vision Assessment?: No apparent visual deficits Perception  Perception: Within Functional Limits Praxis Praxis: Intact Cognition Overall Cognitive Status: Within Functional Limits for tasks assessed Arousal/Alertness: Awake/alert Orientation Level: Oriented X4 Year: 2022 Month: December Day of Week: Correct Focused Attention: Appears intact Memory: Appears intact Immediate Memory Recall: Sock;Blue;Bed Memory Recall Sock: Without Cue Memory Recall Blue: Without Cue Memory Recall Bed: Without Cue Awareness: Appears intact Problem Solving: Appears intact Safety/Judgment: Appears intact Sensation Sensation Light Touch: Impaired by gross assessment Additional Comments:  diminished sensation in toes of LLE Coordination Gross Motor Movements are Fluid and Coordinated: No Fine Motor Movements are Fluid and Coordinated: Yes Coordination and Movement Description: Affected by swelling and multiple movement restrictions due to fractures/medical precautions Motor  Motor  Motor - Skilled Clinical Observations: restricted due to WB precautions, swelling, and pain during functional activity/movement Motor - Discharge Observations: restricted due to WB precautions, swelling, and pain during functional activity/movement Mobility  Bed Mobility Supine to Sit: Supervision/Verbal cueing Sit to Supine: Supervision/Verbal cueing Scooting to [facility]: Supervision/Verbal Cueing  Trunk/Postural Assessment  Cervical Assessment Cervical Assessment: Within Functional Limits Thoracic Assessment Thoracic Assessment: Within Functional Limits Lumbar Assessment Lumbar Assessment: Within Functional Limits Postural Control Postural Control: Within Functional Limits  Balance Balance Balance Assessed: Yes Static Sitting Balance Static Sitting - Balance Support: No upper extremity supported;Feet supported Static Sitting - Level of Assistance: 6: Modified independent (Device/Increase time) Dynamic Sitting Balance Dynamic Sitting - Balance Support: During functional activity Dynamic Sitting - Level of Assistance: 5: Stand by assistance Extremity/Trunk Assessment RUE Assessment RUE Assessment: Within Functional Limits Active Range of Motion (AROM) Comments: WNL LUE Assessment LUE Assessment: Exceptions to [facility] Active Range of Motion (AROM) Comments: DNT shoulder 2/2 protocol, elbow/wrist/digits WFL but weak and stiff 2/2 immobilization   Viona Gilmore 08/01/2021, 4:32 PM

## 2021-08-01 NOTE — Progress Notes (Signed)
Patient ID: Kathryn Boyer, female   DOB: 1956-06-12, 65 y.o.   MRN: [id number] Pt transport set up for 12:00 via [facility]. Have let pt and staff know

## 2021-08-01 NOTE — Progress Notes (Signed)
Patient is A&O x 4 and able to make her needs known. Patient educated on NWB LLE and LUE. Sling is on LLE as ordered. Soft cast on LLE, toes warm to touch, able to wiggle her toes, and cap refill less than 3 seconds. RLE brace on as ordered. Patient sitting in high back chair legs elevated, ice per order. No questions at this time. Call light and personal items all within reach. Safety maintained.

## 2021-08-01 NOTE — Progress Notes (Signed)
Inpatient Rehabilitation Care Coordinator Discharge Note   Patient Details  Name: Kathryn Boyer MRN: [id number] Date of Birth: Sep 18, 1955   Discharge location: HOME WITH HUSBAND AND HIRED 24/7 CAREGIVER  Length of Stay: 6 DAYS   Discharge activity level: CGA-MIN WHEELCHAIR LEVEL  Home/community participation: ACTIVE  Patient response [id number] Literacy - How often do you need to have someone help you when you read instructions, pamphlets, or other written material from your doctor or pharmacy?: Never  Patient response HA:LPFXTK Isolation - How often do you feel lonely or isolated from those around you?: Sometimes  Services provided included: MD, RD, PT, OT, RN, CM, Pharmacy, SW  Financial Services:  Field seismologist Utilized: Barrister's clerk  Choices offered to/list presented to: PT AND HUSBAND  Follow-up services arranged:  DME, Patient/Family has no preference for HH/DME agencies      DME : [facility] BED, WHEELCHAIR, BARIATRIC DROP-AREM BEDSIDE COMMODE AND SLIDING BOARD    Patient response to transportation need: Is the patient able to respond to transportation needs?: Yes In the past 12 months, has lack of transportation kept you from medical appointments or from getting medications?: No In the past 12 months, has lack of transportation kept you from meetings, work, or from getting things needed for daily living?: No    Comments (or additional information): PT DID WELL WITH TRANSFERS THIS IS ALL SHE COULD WORK ON HERE DUE TO WB ISSUES. AWARE WILL NEED OUTPATIENT THERAPIES ONCE CAN WEIGHT BEAR. SHE HAS HIRED 24/7 CARE AND HUSBAND TO GET RAMP INSTALLED ON TWO SETS OF OUTDOOR STAIRS ONCE HOME  Patient/Family verbalized understanding of follow-up arrangements:  Yes  Individual responsible for coordination of the follow-up plan: SELF [phone]  [facility] [phone]  Confirmed correct DME delivered: Lucy Chris 08/01/2021    Lucy Chris

## 2021-08-01 NOTE — Progress Notes (Signed)
Physical Therapy Discharge Summary  Patient Details  Name: Kathryn Boyer MRN: [id number] Date of Birth: 25-Apr-1956  Today's Date: 08/01/2021 PT Individual Time: 0930-1100 PT Individual Time Calculation (min): 90 min    Patient has met 6 of 7 long term goals due to ability to compensate for deficits.  Patient to discharge at a wheelchair level Supervision.   Patient's care partner is independent to provide the necessary physical assistance at discharge.  Reasons goals not met: Pt requires assist to place slideboard when performing car transfer d/t L shoulder precautions.   Recommendation:  Patient will benefit from ongoing skilled PT services in home health setting to continue to advance safe functional mobility, address ongoing impairments in functional mobility, strength, ROM, and minimize fall risk.  Equipment: 18x18 wheelchair  Reasons for discharge: treatment goals met and discharge from hospital  Patient/family agrees with progress made and goals achieved: Yes  Skilled Therapeutic Interventions/Progress Updates:  Pt received in recliner and agreeable to therapy.  Pt reports some discomfort in her shoulder and BLE, premedicated. Rest and positioning provided as needed. Strength and sensation testing performed as documented above and below. Pt performed lateral scoot transfer to [facility] with close supervision. Continent bladder void, mod I hygiene, nearly supervision clothing management but required assist to pull pants up in the back. Lateral scoot to w/Boyer in same manner as above. Pt propelled w/Boyer with RLE and RUE x 100 ft. Pt then transported to therapy gym. Squat pivot transfer w/Boyer<>mat table x 3 for improved technique and increased independence. Pt then provided with and instructed on HEP as documented below. Pt returned to room and remained in w/Boyer with all needs in reach. Session was limited d/t pt hyperverbal.   PT Discharge Precautions/Restrictions Precautions Precautions:  Fall;Shoulder Type of Shoulder Precautions: Conservative shoulder protocol: no PROM/AROM. Cleared for ROM of L hand, wrist, and elbow. Shoulder Interventions: At all times;Off for dressing/bathing/exercises;Don joy ultra sling;Shoulder abduction pillow Precaution Comments: Reviewed shoulder precautions Required Braces or Orthoses: Other Brace;Splint/Cast Splint/Cast: LLE for 2 weeks then CAM boot, NWB for 8 weeks Other Brace: post op shoe RLE, WBAT Restrictions Weight Bearing Restrictions: Yes LUE Weight Bearing: Non weight bearing RLE Weight Bearing: Weight bearing as tolerated LLE Weight Bearing: Non weight bearing Other Position/Activity Restrictions: No restrictions knee ROM on left  Pain Interference Pain Interference Pain Effect on Sleep: 1. Rarely or not at all Pain Interference with Therapy Activities: 1. Rarely or not at all Pain Interference with Day-to-Day Activities: 1. Rarely or not at all Vision/Perception  Perception Perception: Within Functional Limits Praxis Praxis: Intact  Cognition Overall Cognitive Status: Within Functional Limits for tasks assessed Arousal/Alertness: Awake/alert Orientation Level: Oriented X4 Year: 2022 Month: December Day of Week: Correct Focused Attention: Appears intact Memory: Appears intact Awareness: Appears intact Problem Solving: Appears intact Sensation Sensation Light Touch: Impaired by gross assessment Additional Comments: diminished sensation in toes of LLE Coordination Gross Motor Movements are Fluid and Coordinated: No Coordination and Movement Description: Affected by swelling and multiple movement restrictions due to fractures/medical precautions Motor  Motor Motor: Other (comment) Motor - Skilled Clinical Observations: restricted due to WB precautions, swelling, and pain during functional activity/movement Motor - Discharge Observations: restricted due to WB precautions, swelling, and pain during functional  activity/movement  Mobility Bed Mobility Bed Mobility: Rolling Right;Rolling Left;Supine to Sit;Sit to Supine;Scooting to [facility] Rolling Right: Supervision/verbal cueing Rolling Left: Supervision/Verbal cueing Supine to Sit: Supervision/Verbal cueing Sit to Supine: Supervision/Verbal cueing Scooting to [facility]: Supervision/Verbal Cueing Transfers Squat Pivot  Transfers: Supervision/Verbal cueing Lateral/Scoot Transfers: Supervision/Verbal cueing Transfer (Assistive device): None Locomotion  Gait Ambulation: No Gait Gait: No Stairs / Additional Locomotion Stairs: No Wheelchair Mobility Wheelchair Mobility: Yes Wheelchair Assistance: Chartered loss adjuster: Right lower extremity Wheelchair Parts Management: Needs assistance Distance: 100 ft  Trunk/Postural Assessment  Cervical Assessment Cervical Assessment: Within Functional Limits Thoracic Assessment Thoracic Assessment: Within Functional Limits Lumbar Assessment Lumbar Assessment: Within Functional Limits Postural Control Postural Control: Within Functional Limits  Balance Balance Balance Assessed: Yes Static Sitting Balance Static Sitting - Balance Support: No upper extremity supported;Feet supported Static Sitting - Level of Assistance: 6: Modified independent (Device/Increase time) Dynamic Sitting Balance Dynamic Sitting - Balance Support: During functional activity Dynamic Sitting - Level of Assistance: 5: Stand by assistance Dynamic Sitting - Balance Activities: Lateral lean/weight shifting;Forward lean/weight shifting Extremity Assessment      RLE Assessment RLE Assessment: Exceptions to [facility] RLE Strength Right Hip Flexion: 4/5 Right Hip Extension: 4/5 Right Hip ABduction: 4+/5 Right Hip ADduction: 4/5 Right Knee Flexion: 4/5 Right Knee Extension: 4/5 Right Ankle Dorsiflexion: 3+/5 (pain) Right Ankle Plantar Flexion: 3+/5 (pain) LLE Assessment LLE Assessment: Exceptions to [facility] LLE  Strength Left Hip Flexion: 3-/5 Left Hip Extension: 3-/5 Left Hip ABduction: 4-/5 Left Hip ADduction: 4-/5 Left Knee Flexion: 4-/5 Left Knee Extension: 4-/5    Kathryn Boyer Kathryn Boyer 08/01/2021, 10:21 AM

## 2021-08-01 NOTE — Progress Notes (Signed)
Occupational Therapy Session Note  Patient Details  Name: Kathryn Boyer MRN: [id number] Date of Birth: 08-Mar-1956  Today's Date: 08/01/2021 OT Individual Time: 0730-0828 OT Individual Time Calculation (min): 58 min    Short Term Goals: Week 1:  OT Short Term Goal 1 (Week 1): STGs=LTGs due to ELOS   Skilled Therapeutic Interventions/Progress Updates:    Pt greeted at time of session bed level agreeable to OT session, venting about recent concerns and provided emotional support, listening to patient concerns. Discussion with pt at beginning of session regarding DC planning, her ability to direct her care with husband and caregivers, etc. Pt stating she does not think ramp will fit at front of house, showing pictures and stating she does not think friend's ramp will work as previously thought. Relayed concerns to SW and PT, all in agreement for nonemergency EMS transport home. Pt needing to toilet at this time, set up with bariatric drop arm BSC and lateral transfer CGA/Supervision bed > BSC > recliner with cues for anterior weight shift, using RUE/RLE with shoe on and lifting buttocks. On commode, pt with continent B&B void x2, Mod A with toileting tasks overall 2/2 multiple fractures and limitations. Reviewed again don/doff UE sling and importance of wearing, preventing WB through shoulder and ROM, supporting LUE. Bathing while seated on BSC with MIN A for RLE and RUE. Reviewed elbow ROM/HEP as well to encourage elbow, wrist, and hand ROM but preventing at shoulder. UB dress Mod A with R > L technique, donning pants for LB dress Mod/Max A with lateral leans. Up in recliner alarm on call bell in reach.   Therapy Documentation Precautions:  Precautions Precautions: Fall, Shoulder Type of Shoulder Precautions: Conservative shoulder protocol: no PROM/AROM. Cleared for ROM of L hand, wrist, and elbow. Shoulder Interventions: At all times, Off for dressing/bathing/exercises, Don joy ultra sling,  Shoulder abduction pillow Precaution Comments: Reviewed shoulder precautions Required Braces or Orthoses: Other Brace, Splint/Cast Splint/Cast: LLE for 2 weeks then CAM boot, NWB for 8 weeks Other Brace: post op shoe RLE, WBAT Restrictions Weight Bearing Restrictions: Yes LUE Weight Bearing: Non weight bearing RLE Weight Bearing: Weight bearing as tolerated LLE Weight Bearing: Non weight bearing Other Position/Activity Restrictions: No restrictions knee ROM on left    Therapy/Group: Individual Therapy  Erasmo Score 08/01/2021, 7:11 AM

## 2021-08-01 NOTE — Progress Notes (Signed)
Patient ID: Kathryn Boyer, female   DOB: 1956-07-31, 65 y.o.   MRN: [id number] pt wants to go home via non-emergency ambulance due to does not have ramp in place for stairs. Will discuss with PT today in therapies.

## 2021-08-01 NOTE — Progress Notes (Signed)
Inpatient Rehabilitation Discharge Medication Review by a Pharmacist  A complete drug regimen review was completed for this patient to identify any potential clinically significant medication issues.  High Risk Drug Classes Is patient taking? Indication by Medication  Antipsychotic No   Anticoagulant Yes Xarelto- VTE prophylaxis s/p ortho X30 days total. End date 08/27/2021  Antibiotic No   Opioid Yes OxyIR- post-op pain  Antiplatelet No   Hypoglycemics/insulin No   Vasoactive Medication Yes Olmesartan- hypertension  Chemotherapy No   Other No      Type of Medication Issue Identified Description of Issue Recommendation(s)  Drug Interaction(s) (clinically significant)     Duplicate Therapy     Allergy     No Medication Administration End Date     Incorrect Dose     Additional Drug Therapy Needed     Significant med changes from prior encounter (inform family/care partners about these prior to discharge).    Other       Clinically significant medication issues were identified that warrant physician communication and completion of prescribed/recommended actions by midnight of the next day:  No  Pharmacist note:  Patient stated to med rec tech upon admission (date 07/20/2021) that she does not take a number of her ordered meds for discharge. Looking at office notes from [facility], I am unable to locate any indication that the medications were stopped by her provider. It appears that not taking these medications is patient preference from information gathered in the EMR. I would recommend continuing medications as stated in the discharge summary.  Time spent performing this drug regimen review (minutes):  75   Bucky Grigg BS, PharmD, BCPS Clinical Pharmacist 08/01/2021 9:02 AM
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
## 2021-08-02 NOTE — Progress Notes (Signed)
   ORTHOPAEDIC PROGRESS NOTE  s/p Procedure(s): LEFT REVERSE SHOULDER ARTHROPLASTY for proximal humerus fracture on 07/25/2021 with Dr. Everardo Pacific  ORIF left ankle fracture dislocation on 07/23/2021 with Dr. Carola Frost  SUBJECTIVE: Patient doing well today. Happy with how shoulder therapy went yesterday. She is planning to go home today. Pain well controlled.   OBJECTIVE: PE: General: sitting up in hospital bed, NAD LUE: Dressing CDI. Aquacel removed. Incision CDI. New steri strips placed. sling well fitting,  full and painless ROM throughout hand with DPC of 0. + Motor in  AIN, PIN, Ulnar distributions. Axillary nerve sensation preserved and symmetric.  Sensation intact in medial, radial, and ulnar distributions. Well perfused digits.  LLE: splint CDI, +EHL though remainder of motor difficult to test due to splint, sensation intact distally with warm well perfused foot, no pain w passive stretch.  RLE: intact EHL/TA/GSC. Endorses distal sensation. Warm well perfused foot   Vitals:   08/01/21 1931 08/02/21 0505  BP: (!) 117/55 113/70  Pulse: 68 70  Resp: 18 18  Temp: 98.1 F (36.7 C) 98.5 F (36.9 C)  SpO2: 97% 96%    Stable post-op images of left shoulder.   ASSESSMENT: Kathryn Boyer is a 65 y.o. female doing well postoperatively.  - s/p ORIF left ankle fracture dislocation with Dr. Carola Frost - POD #10  - s/p Left reverse total shoulder arthroplasty for proximal humerus fracture with Dr. Everardo Pacific - POD #8  - non operative management of left tibial plateau fracture  - non operative management of right distal fibula fracture and fifth metatarsal bone  PLAN: Weightbearing:  - LUE: NWB, okay to use left arm for ADLs at waist level  - LLE: NWB x 8 weeks  - RLE: WBAT in post-op shoe   Insicional and dressing care:   - LUE: aquacel dressing removed. New steri-strips placed. Leave steri-strips in place until they fall off on their own.   - LLE: Keep splint clean and dry   Orthopedic  device(s):   - LUE: sling at all times except ADLs at waist level and exercises  - LLE:  Splint x 2 weeks then convert to CAM  - RLE: post op shoe  Follow - up plan: 3 weeks in office with Dr. Everardo Pacific for left shoulder.   Dispo: Per primary team.   Contact information:  Dr. Ramond Marrow, Alfonse Alpers, After hours and holidays please check [email or web address] for group call information for [facility]   Alfonse Alpers, PA-C 08/02/2021
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
## 2021-08-02 NOTE — Progress Notes (Signed)
INPATIENT REHABILITATION DISCHARGE NOTE   Discharge instructions by: Jesusita Oka, PA  Verbalized understanding: yes  Skin care/Wound care healing? none  Pain: LLE, medicated before discharge  IV's: none  Tubes/Drains: none  O2: room air  Safety instructions:   Patient belongings: sent home with patient  Discharged to: [facility]  Discharged via: ambulance  Notes:

## 2021-08-06 DIAGNOSIS — S92351A Displaced fracture of fifth metatarsal bone, right foot, initial encounter for closed fracture: Secondary | ICD-10-CM | POA: Diagnosis not present

## 2021-08-08 ENCOUNTER — Telehealth: Payer: Self-pay | Admitting: Family Medicine

## 2021-08-09 ENCOUNTER — Other Ambulatory Visit: Payer: Self-pay | Admitting: Family Medicine

## 2021-08-09 ENCOUNTER — Other Ambulatory Visit: Payer: Self-pay | Admitting: Physician Assistant

## 2021-08-09 DIAGNOSIS — S92355A Nondisplaced fracture of fifth metatarsal bone, left foot, initial encounter for closed fracture: Secondary | ICD-10-CM

## 2021-08-09 DIAGNOSIS — S42292A Other displaced fracture of upper end of left humerus, initial encounter for closed fracture: Secondary | ICD-10-CM

## 2021-08-09 DIAGNOSIS — S82852A Displaced trimalleolar fracture of left lower leg, initial encounter for closed fracture: Secondary | ICD-10-CM

## 2021-08-09 DIAGNOSIS — S82142A Displaced bicondylar fracture of left tibia, initial encounter for closed fracture: Secondary | ICD-10-CM

## 2021-08-09 DIAGNOSIS — S82891A Other fracture of right lower leg, initial encounter for closed fracture: Secondary | ICD-10-CM

## 2021-08-09 DIAGNOSIS — W19XXXA Unspecified fall, initial encounter: Secondary | ICD-10-CM

## 2021-08-10 ENCOUNTER — Telehealth (INDEPENDENT_AMBULATORY_CARE_PROVIDER_SITE_OTHER): Payer: BC Managed Care – PPO | Admitting: Family

## 2021-08-10 ENCOUNTER — Telehealth: Payer: Self-pay | Admitting: Family Medicine

## 2021-08-10 DIAGNOSIS — N3000 Acute cystitis without hematuria: Secondary | ICD-10-CM

## 2021-08-10 DIAGNOSIS — T07XXXA Unspecified multiple injuries, initial encounter: Secondary | ICD-10-CM | POA: Diagnosis not present

## 2021-08-10 DIAGNOSIS — I10 Essential (primary) hypertension: Secondary | ICD-10-CM | POA: Diagnosis not present

## 2021-08-10 DIAGNOSIS — R350 Frequency of micturition: Secondary | ICD-10-CM

## 2021-08-10 DIAGNOSIS — L89159 Pressure ulcer of sacral region, unspecified stage: Secondary | ICD-10-CM

## 2021-08-10 DIAGNOSIS — M810 Age-related osteoporosis without current pathological fracture: Secondary | ICD-10-CM

## 2021-08-10 HISTORY — DX: Pressure ulcer of sacral region, unspecified stage: L89.159

## 2021-08-10 HISTORY — DX: Acute cystitis without hematuria: N30.00

## 2021-08-10 MED ORDER — OXYBUTYNIN CHLORIDE ER 5 MG PO TB24: 5.0000 mg | ORAL_TABLET | Freq: Every day | ORAL | 2 refills | Status: DC

## 2021-08-10 MED ORDER — ALENDRONATE SODIUM 70 MG PO TABS: ORAL_TABLET | ORAL | 4 refills | Status: DC

## 2021-08-10 MED ORDER — CEPHALEXIN 500 MG PO CAPS: 500.0000 mg | ORAL_CAPSULE | Freq: Two times a day (BID) | ORAL | 0 refills | Status: DC

## 2021-08-10 NOTE — Assessment & Plan Note (Signed)
New. She is unable to show me on today's video visit, but I have asked her to have her husband photograph the wound and send me a photo using mychart. Will request home health RN for wound assessment/wound care.  In the meantime, we discussed the following measures:   Apply duoderm hydrocolloid dressing to clean dry wound every 3 days.  If you can't find it at your local pharmacy- you can order it on [facility] Purchase a gel overlay for your chair as well as a gel mattress overlay for your bed. Make sure to change positions every 1.5-2 hrs.

## 2021-08-10 NOTE — Assessment & Plan Note (Addendum)
She is basically housebound and is struggling to find someone to install a ramp at her home. She has received a call from home health but states that they are "having trouble finding someone to come out." Will re-initiate home health referral for PT/OT.

## 2021-08-10 NOTE — Assessment & Plan Note (Signed)
New. Will begin empiric keflex. If symptoms fail to improve, we will need to obtain a urine culture.

## 2021-08-10 NOTE — Telephone Encounter (Signed)
Vebal given

## 2021-08-10 NOTE — Telephone Encounter (Signed)
[facility]: Garth Schlatter: [phone]  Pt is needing HH nursing for bed sores. Lorene Dy called to see if Dr.Lowne is willing to follow pt and write future orders for pt care needs. She needs a verbal stating she is.

## 2021-08-10 NOTE — Assessment & Plan Note (Signed)
Severe. Recommended that she begin fosamax 70mg  once weekly. States she did not tolerate prolia.  She is advised to take in the AM and sit upright after taking for 90+ minutes.

## 2021-08-10 NOTE — Assessment & Plan Note (Signed)
Pt with chronic frequency and incontinence which is very bothersome for her. Will give trial of oxybutynin.

## 2021-08-10 NOTE — Patient Instructions (Addendum)
Begin Keflex to help with urinary tract infection.  Begin Oxybutynin as prescribed for urinary frequency at night. Please collect a urine sample and have it returned to our office at your earliest convenience.  Please have a photograph taken of your bed sore and send it in a message via MyChart at your earliest convenience.  Apply duoderm hydrocolloid dressing to clean dry wound every 3 days.  If you can't find it at your local pharmacy- you can order it on [facility]. Purchase a gel overlay for your chair as well as a gel mattress overlay for your bed. Make sure to change positions every 1.5-2 hrs.  Begin fosamax once weekly for your severe osteoporosis.

## 2021-08-10 NOTE — Progress Notes (Signed)
MyChart Video Visit    Virtual Visit via Video Note   This visit type was conducted due to national recommendations for restrictions regarding the COVID-19 Pandemic (e.g. social distancing) in an effort to limit this patient's exposure and mitigate transmission in our community. This patient is at least at moderate risk for complications without adequate follow up. This format is felt to be most appropriate for this patient at this time. Physical exam was limited by quality of the video and audio technology used for the visit. Kathryn MouldingRuth was able to get the patient set up on a video visit.  Patient location: Home Patient and provider in visit Provider location: Office  I discussed the limitations of evaluation and management by telemedicine and the availability of in person appointments. The patient expressed understanding and agreed to proceed.  Visit Date: 08/10/2021  Today's healthcare provider: Lemont FillersMelissa S O'Sullivan, NP     Subjective:    Patient ID: Kathryn Boyer, female    DOB: [birth year], 65 y.o.   MRN: [id number]  Chief Complaint  Patient presents with   Urinary Frequency    Complains of urinary frequency, urgency and burning with urination.    Wound Check    Patient complains of bed sore on tail bone due to bed bound after multiple fractures during fall.     HPI Patient is in today for a video visit.  ED visit: She presented to the ED on 07/20/21 following a fall.   X-rays in ED revealed the following:  Moderately displaced trimalleolar ankle fracture; Minimally displaced fracture of the base of the right fifth metatarsal; nondisplaced fracture of the lateral malleolus; Comminuted, impacted left proximal humerus fracture.  She underwent ORIF of her right ankle fracture on 11/28.  Her current weight bearing status is as follows:  Nonweightbearing left upper extremity May use left arm for ADLs at waist level. Weightbearing as tolerated right lower extremity postop shoe for  transfers.  She was then transferred to in an inpatient rehab facility from 08/02/2021 to 08/09/2021 that she requested to leave due to a lack of patient care/poor staffing.   Associated issues: She is experiencing pain in her shoulder. She notes that her other provider will not prescribe her anymore oxycodone at this time. She is currently using gabapentin to help with this pain. She is expecting a call from her orthopedic team this afternoon to discuss pain management options.  Dexa: Her last dexa scan was on 05/10/2019 and the results revealed severe osteoporosis. She participates in regular weight bearing exercises with a program but has not began any medications to help build her bones.   Immunizations: She is interested in receiving the updated bivalent Covid-19 vaccine at another time once she has resolved some of her current concerns.   UTI: She believes she has a UTI at this time. She is experiencing mild burning when she urinates and notes that her urine has a foul odor. She also reports frequency. This is difficult for her right now due to her leg being injured and not being mobile enough to get herself to the washroom by herself. She notes chronic urinary incontinence as well.   Skin Ulcer: She has a bed sore in the sacral decubitus region which was noted by one of her home health aids. She notes that the pain began in the hospital when they were using washcloths rather than wipes to clean up her skin. Her aid noticed it when she was helping her clean up. She reports  that she is unable to move positions well due to the injury on her leg and her recent shoulder replacement surgery has limited her range of motion.   Past Medical History:  Diagnosis Date   Anxiety    Asthma    Hyperlipidemia    Hypertension    Plantar fasciitis    Thyroid disease     Past Surgical History:  Procedure Laterality Date   ABDOMINAL HYSTERECTOMY     BREAST SURGERY     NOSE SURGERY     ORIF ANKLE  FRACTURE Left 07/23/2021   Procedure: OPEN REDUCTION INTERNAL FIXATION (ORIF) ANKLE FRACTURE;  Surgeon: Altamese Gasburg, MD;  Location: [place];  Service: Orthopedics;  Laterality: Left;   REVERSE SHOULDER ARTHROPLASTY Left 07/25/2021   Procedure: LEFT REVERSE SHOULDER ARTHROPLASTY;  Surgeon: Hiram Gash, MD;  Location: [facility];  Service: Orthopedics;  Laterality: Left;    Family History  Problem Relation Age of Onset   Heart disease Mother    Stroke Mother    Hypertension Mother    Cancer Mother        ovarian   Aneurysm Father    Hypertension Father    Heart disease Father    Kidney disease Father    Heart disease Sister     Social History   Socioeconomic History   Marital status: Married    Spouse name: Not on file   Number of children: Not on file   Years of education: Not on file   Highest education level: Not on file  Occupational History   Not on file  Tobacco Use   Smoking status: Never   Smokeless tobacco: Never  Vaping Use   Vaping Use: Never used  Substance and Sexual Activity   Alcohol use: No   Drug use: No   Sexual activity: Not on file  Other Topics Concern   Not on file  Social History Narrative   Exercise-sometimes   Social Determinants of Health   Financial Resource Strain: Not on file  Food Insecurity: Not on file  Transportation Needs: Not on file  Physical Activity: Not on file  Stress: Not on file  Social Connections: Not on file  Intimate Partner Violence: Not on file    Outpatient Medications Prior to Visit  Medication Sig Dispense Refill   acetaminophen (TYLENOL) 325 MG tablet Take 1-2 tablets (325-650 mg total) by mouth every 6 (six) hours as needed for mild pain (pain score 1-3 or temp > 100.5).     ALPRAZolam (XANAX) 0.5 MG tablet Take 1 tablet (0.5 mg total) by mouth 3 (three) times daily as needed for anxiety. 30 tablet 0   ascorbic acid (VITAMIN C) 500 MG tablet Take 1 tablet (500 mg total) by mouth daily. 30 tablet 0   calcium  citrate (CALCITRATE - DOSED IN MG ELEMENTAL CALCIUM) 950 (200 Ca) MG tablet Take 1 tablet (200 mg of elemental calcium total) by mouth 2 (two) times daily before lunch and supper. 30 tablet 0   cholecalciferol (VITAMIN D) 25 MCG tablet Take 2 tablets (2,000 Units total) by mouth 2 (two) times daily. 60 tablet 0   estradiol (ESTRACE) 0.1 MG/GM vaginal cream Place 1 Applicatorful vaginally 3 (three) times a week. 42.5 g 3   estradiol (VIVELLE-DOT) 0.025 MG/24HR Place 1 patch onto the skin 2 (two) times a week. 24 patch 5   famotidine (PEPCID) 20 MG tablet Take 1 tablet (20 mg total) by mouth 2 (two) times daily. 180 tablet 1  flunisolide (NASALIDE) 25 MCG/ACT (0.025%) SOLN Place 2 sprays into the nose 2 (two) times daily. 1 Bottle 5   fluticasone (FLOVENT HFA) 110 MCG/ACT inhaler Inhale 2 puffs into the lungs 2 (two) times daily. 1 Inhaler 5   gabapentin (NEURONTIN) 100 MG capsule Take 1 capsule (100 mg total) by mouth 3 (three) times daily. 90 capsule 0   levothyroxine (SYNTHROID) 100 MCG tablet TAKE 1 TABLET BY MOUTH EVERY DAY (Patient taking differently: Take 100 mcg by mouth daily before breakfast.) 90 tablet 1   methocarbamol (ROBAXIN) 750 MG tablet Take 1 tablet (750 mg total) by mouth 3 (three) times daily. 90 tablet 0   montelukast (SINGULAIR) 10 MG tablet Take 1 tablet (10 mg total) by mouth at bedtime. 30 tablet 5   naproxen (NAPROSYN) 250 MG tablet Take 1 tablet (250 mg total) by mouth 2 (two) times daily with a meal. 60 tablet 0   olmesartan (BENICAR) 40 MG tablet TAKE 1 TABLET(40 MG) BY MOUTH DAILY 90 tablet 1   oxyCODONE (OXY IR/ROXICODONE) 5 MG immediate release tablet Take 1-2 tablets (5-10 mg total) by mouth every 4 (four) hours as needed for moderate pain (pain score 4-6). 30 tablet 0   polyethylene glycol (MIRALAX / GLYCOLAX) 17 g packet Take 17 g by mouth 2 (two) times daily. 14 each 0   pravastatin (PRAVACHOL) 20 MG tablet TAKE 1 TABLET(20 MG) BY MOUTH DAILY (Patient taking  differently: Take 20 mg by mouth daily.) 90 tablet 1   rivaroxaban (XARELTO) 10 MG TABS tablet Take 1 tablet (10 mg total) by mouth daily. 24 tablet 0   senna-docusate (SENOKOT-S) 8.6-50 MG tablet Take 2 tablets by mouth 2 (two) times daily.     VENTOLIN HFA 108 (90 Base) MCG/ACT inhaler Inhale 2 puffs into the lungs every 4 (four) hours as needed for wheezing or shortness of breath. 18 g 1   Vitamin D, Ergocalciferol, (DRISDOL) 1.25 MG (50000 UNIT) CAPS capsule Take 1 capsule (50,000 Units total) by mouth every 7 (seven) days. 12 capsule 1   risedronate (ACTONEL) 35 MG tablet TAKE 1 TAB BY MOUTH EVERY 7 DAYS WITH WATER ON EMPTY STOMACH, NOTHING BY MOUTH OR LIE DOWN FOR NEXT 30 MINUTES. 12 tablet 1   No facility-administered medications prior to visit.    Allergies  Allergen Reactions   Contrast Media [Iodinated Diagnostic Agents] Hives   Isovue [Iopamidol] Hives    After returning home pt called to inform technologist of rash to face and neck, denies difficulty breathing or swallowing, pt took 2 benedryl Will need premeds in future   Amoxicillin Other (See Comments)    Headache  Cephalosporin tolerant 07/23/2021   Belviq Xr [Lorcaserin Hcl Er] Itching    Caused her face to turn red.   Saxenda [Liraglutide -Weight Management] Other (See Comments)    Caused indigestion.   Penicillins Rash    Cephalosporin Tolerant 07/23/2021     Review of Systems  Genitourinary:  Positive for dysuria and frequency.       (+) foul odor in urination  Musculoskeletal:        (+) shoulder pain   Skin:        (+) bed sore on legs      Objective:    Physical Exam Constitutional:      General: She is not in acute distress.    Appearance: Normal appearance. She is not ill-appearing.  HENT:     Head: Normocephalic and atraumatic.     Right  Ear: External ear normal.     Left Ear: External ear normal.  Pulmonary:     Effort: Pulmonary effort is normal.  Neurological:     Mental Status: She is  alert.  Psychiatric:        Behavior: Behavior normal.        Judgment: Judgment normal.    There were no vitals taken for this visit. Wt Readings from Last 3 Encounters:  07/27/21 191 lb 9.3 oz (86.9 kg)  07/25/21 177 lb 14.6 oz (80.7 kg)  06/21/21 183 lb (83 kg)    Diabetic Foot Exam - Simple   No data filed    Lab Results  Component Value Date   WBC 8.6 07/31/2021   HGB 10.2 (L) 07/31/2021   HCT 30.8 (L) 07/31/2021   PLT 375 07/31/2021   GLUCOSE 104 (H) 07/26/2021   CHOL 184 04/19/2021   TRIG 109.0 04/19/2021   HDL 52.30 04/19/2021   LDLCALC 109 (H) 04/19/2021   ALT 19 07/24/2021   AST 26 07/24/2021   NA 139 07/26/2021   K 3.7 07/26/2021   CL 103 07/26/2021   CREATININE 0.92 07/27/2021   BUN 11 07/26/2021   CO2 27 07/26/2021   TSH 0.400 07/24/2021    Lab Results  Component Value Date   TSH 0.400 07/24/2021   Lab Results  Component Value Date   WBC 8.6 07/31/2021   HGB 10.2 (L) 07/31/2021   HCT 30.8 (L) 07/31/2021   MCV 93.6 07/31/2021   PLT 375 07/31/2021   Lab Results  Component Value Date   NA 139 07/26/2021   K 3.7 07/26/2021   CO2 27 07/26/2021   GLUCOSE 104 (H) 07/26/2021   BUN 11 07/26/2021   CREATININE 0.92 07/27/2021   BILITOT 0.8 07/24/2021   ALKPHOS 63 07/24/2021   AST 26 07/24/2021   ALT 19 07/24/2021   PROT 5.9 (L) 07/24/2021   ALBUMIN 3.0 (L) 07/24/2021   CALCIUM 8.6 (L) 07/26/2021   ANIONGAP 9 07/26/2021   GFR 60.36 04/19/2021   Lab Results  Component Value Date   CHOL 184 04/19/2021   Lab Results  Component Value Date   HDL 52.30 04/19/2021   Lab Results  Component Value Date   LDLCALC 109 (H) 04/19/2021   Lab Results  Component Value Date   TRIG 109.0 04/19/2021   Lab Results  Component Value Date   CHOLHDL 4 04/19/2021   No results found for: HGBA1C     Assessment & Plan:   Problem List Items Addressed This Visit       Unprioritized   Urinary frequency    Pt with chronic frequency and incontinence  which is very bothersome for her. Will give trial of oxybutynin.       Pressure injury of skin of sacral region - Primary    New. She is unable to show me on today's video visit, but I have asked her to have her husband photograph the wound and send me a photo using mychart. Will request home health RN for wound assessment/wound care.  In the meantime, we discussed the following measures:   Apply duoderm hydrocolloid dressing to clean dry wound every 3 days.  If you can't find it at your local pharmacy- you can order it on [facility]. Purchase a gel overlay for your chair as well as a gel mattress overlay for your bed. Make sure to change positions every 1.5-2 hrs.       Relevant Orders   Ambulatory  referral to Home Health   Osteoporosis    Severe. Recommended that she begin fosamax 70mg  once weekly. States she did not tolerate prolia.  She is advised to take in the AM and sit upright after taking for 90+ minutes.       Relevant Medications   alendronate (FOSAMAX) 70 MG tablet   Essential hypertension    BP Readings from Last 3 Encounters:  08/02/21 113/70  07/27/21 104/61  06/21/21 (!) 151/74  BP stable. Monitor.       Critical polytrauma    She is basically housebound and is struggling to find someone to install a ramp at her home. She has received a call from home health but states that they are "having trouble finding someone to come out." Will re-initiate home health referral for PT/OT.       Acute cystitis without hematuria    New. Will begin empiric keflex. If symptoms fail to improve, we will need to obtain a urine culture.       Meds ordered this encounter  Medications   oxybutynin (DITROPAN-XL) 5 MG 24 hr tablet    Sig: Take 1 tablet (5 mg total) by mouth at bedtime.    Dispense:  30 tablet    Refill:  2    Order Specific Question:   Supervising Provider    Answer:   06/23/21 A [[id number]]   cephALEXin (KEFLEX) 500 MG capsule    Sig: Take 1 capsule (500 mg total)  by mouth 2 (two) times daily.    Dispense:  10 capsule    Refill:  0    Order Specific Question:   Supervising Provider    Answer:   Danise Edge A [[id number]]   alendronate (FOSAMAX) 70 MG tablet    Sig: Take with a full glass of water on an empty stomach in the AM and sit upright for >90 minutes after taking    Dispense:  12 tablet    Refill:  4    Order Specific Question:   Supervising Provider    Answer:   Danise Edge A [[id number]]    I discussed the assessment and treatment plan with the patient. The patient was provided an opportunity to ask questions and all were answered. The patient agreed with the plan and demonstrated an understanding of the instructions.   The patient was advised to call back or seek an in-person evaluation if the symptoms worsen or if the condition fails to improve as anticipated.  I,Lyric Barr-McArthur,acting as a Danise Edge for Neurosurgeon, NP.,have documented all relevant documentation on the behalf of [employer], NP,as directed by  Lemont Fillers, NP while in the presence of Lemont Fillers, NP.  I provided 45 minutes of face-to-face time during this encounter. Time was spent counseling pt on wound care, osteoporosis, OAB and reviewing extensive medical records.    Lemont Fillers, NP Lemont Fillers at [employer] [phone] (phone) [phone] (fax)  [facility]

## 2021-08-10 NOTE — Assessment & Plan Note (Signed)
BP Readings from Last 3 Encounters:  08/02/21 113/70  07/27/21 104/61  06/21/21 (!) 151/74   BP stable. Monitor.

## 2021-08-12 ENCOUNTER — Other Ambulatory Visit: Payer: Self-pay | Admitting: Family Medicine

## 2021-08-12 DIAGNOSIS — E039 Hypothyroidism, unspecified: Secondary | ICD-10-CM

## 2021-08-13 ENCOUNTER — Telehealth (HOSPITAL_BASED_OUTPATIENT_CLINIC_OR_DEPARTMENT_OTHER): Payer: Self-pay

## 2021-08-13 DIAGNOSIS — I700 Atherosclerosis of aorta: Secondary | ICD-10-CM | POA: Diagnosis not present

## 2021-08-13 DIAGNOSIS — M810 Age-related osteoporosis without current pathological fracture: Secondary | ICD-10-CM | POA: Diagnosis not present

## 2021-08-13 DIAGNOSIS — M5416 Radiculopathy, lumbar region: Secondary | ICD-10-CM | POA: Diagnosis not present

## 2021-08-13 DIAGNOSIS — J4521 Mild intermittent asthma with (acute) exacerbation: Secondary | ICD-10-CM | POA: Diagnosis not present

## 2021-08-13 DIAGNOSIS — K5903 Drug induced constipation: Secondary | ICD-10-CM | POA: Diagnosis not present

## 2021-08-13 DIAGNOSIS — S8264XD Nondisplaced fracture of lateral malleolus of right fibula, subsequent encounter for closed fracture with routine healing: Secondary | ICD-10-CM | POA: Diagnosis not present

## 2021-08-13 DIAGNOSIS — D62 Acute posthemorrhagic anemia: Secondary | ICD-10-CM | POA: Diagnosis not present

## 2021-08-13 DIAGNOSIS — I10 Essential (primary) hypertension: Secondary | ICD-10-CM | POA: Diagnosis not present

## 2021-08-13 DIAGNOSIS — S82852D Displaced trimalleolar fracture of left lower leg, subsequent encounter for closed fracture with routine healing: Secondary | ICD-10-CM | POA: Diagnosis not present

## 2021-08-13 DIAGNOSIS — S92354D Nondisplaced fracture of fifth metatarsal bone, right foot, subsequent encounter for fracture with routine healing: Secondary | ICD-10-CM | POA: Diagnosis not present

## 2021-08-13 DIAGNOSIS — E039 Hypothyroidism, unspecified: Secondary | ICD-10-CM | POA: Diagnosis not present

## 2021-08-13 DIAGNOSIS — S42292D Other displaced fracture of upper end of left humerus, subsequent encounter for fracture with routine healing: Secondary | ICD-10-CM | POA: Diagnosis not present

## 2021-08-13 DIAGNOSIS — K219 Gastro-esophageal reflux disease without esophagitis: Secondary | ICD-10-CM | POA: Diagnosis not present

## 2021-08-13 DIAGNOSIS — S82142D Displaced bicondylar fracture of left tibia, subsequent encounter for closed fracture with routine healing: Secondary | ICD-10-CM | POA: Diagnosis not present

## 2021-08-13 DIAGNOSIS — E785 Hyperlipidemia, unspecified: Secondary | ICD-10-CM | POA: Diagnosis not present

## 2021-08-13 DIAGNOSIS — F4322 Adjustment disorder with anxiety: Secondary | ICD-10-CM | POA: Diagnosis not present

## 2021-08-15 ENCOUNTER — Telehealth: Payer: Self-pay | Admitting: Family Medicine

## 2021-08-15 DIAGNOSIS — S82132D Displaced fracture of medial condyle of left tibia, subsequent encounter for closed fracture with routine healing: Secondary | ICD-10-CM | POA: Diagnosis not present

## 2021-08-15 DIAGNOSIS — S92351D Displaced fracture of fifth metatarsal bone, right foot, subsequent encounter for fracture with routine healing: Secondary | ICD-10-CM | POA: Diagnosis not present

## 2021-08-15 DIAGNOSIS — S82852D Displaced trimalleolar fracture of left lower leg, subsequent encounter for closed fracture with routine healing: Secondary | ICD-10-CM | POA: Diagnosis not present

## 2021-08-15 NOTE — Telephone Encounter (Signed)
Verbal given 

## 2021-08-15 NOTE — Telephone Encounter (Signed)
Home health called and stated pt fell and was seen by a surgeon who referred her for physical therapy effective 12/19. She is requesting orders for physical therapy 2x a week for 4 weeks, then once a week x2. She can be reached at [phone].

## 2021-08-17 DIAGNOSIS — S82142D Displaced bicondylar fracture of left tibia, subsequent encounter for closed fracture with routine healing: Secondary | ICD-10-CM | POA: Diagnosis not present

## 2021-08-17 DIAGNOSIS — I700 Atherosclerosis of aorta: Secondary | ICD-10-CM | POA: Diagnosis not present

## 2021-08-17 DIAGNOSIS — F4322 Adjustment disorder with anxiety: Secondary | ICD-10-CM | POA: Diagnosis not present

## 2021-08-17 DIAGNOSIS — K5903 Drug induced constipation: Secondary | ICD-10-CM | POA: Diagnosis not present

## 2021-08-17 DIAGNOSIS — D62 Acute posthemorrhagic anemia: Secondary | ICD-10-CM | POA: Diagnosis not present

## 2021-08-17 DIAGNOSIS — M5416 Radiculopathy, lumbar region: Secondary | ICD-10-CM | POA: Diagnosis not present

## 2021-08-17 DIAGNOSIS — S42292D Other displaced fracture of upper end of left humerus, subsequent encounter for fracture with routine healing: Secondary | ICD-10-CM | POA: Diagnosis not present

## 2021-08-17 DIAGNOSIS — K219 Gastro-esophageal reflux disease without esophagitis: Secondary | ICD-10-CM | POA: Diagnosis not present

## 2021-08-17 DIAGNOSIS — S92354D Nondisplaced fracture of fifth metatarsal bone, right foot, subsequent encounter for fracture with routine healing: Secondary | ICD-10-CM | POA: Diagnosis not present

## 2021-08-17 DIAGNOSIS — E785 Hyperlipidemia, unspecified: Secondary | ICD-10-CM | POA: Diagnosis not present

## 2021-08-17 DIAGNOSIS — I10 Essential (primary) hypertension: Secondary | ICD-10-CM | POA: Diagnosis not present

## 2021-08-17 DIAGNOSIS — S8264XD Nondisplaced fracture of lateral malleolus of right fibula, subsequent encounter for closed fracture with routine healing: Secondary | ICD-10-CM | POA: Diagnosis not present

## 2021-08-17 DIAGNOSIS — M810 Age-related osteoporosis without current pathological fracture: Secondary | ICD-10-CM | POA: Diagnosis not present

## 2021-08-17 DIAGNOSIS — S82852D Displaced trimalleolar fracture of left lower leg, subsequent encounter for closed fracture with routine healing: Secondary | ICD-10-CM | POA: Diagnosis not present

## 2021-08-17 DIAGNOSIS — E039 Hypothyroidism, unspecified: Secondary | ICD-10-CM | POA: Diagnosis not present

## 2021-08-17 DIAGNOSIS — J4521 Mild intermittent asthma with (acute) exacerbation: Secondary | ICD-10-CM | POA: Diagnosis not present

## 2021-08-20 DIAGNOSIS — M810 Age-related osteoporosis without current pathological fracture: Secondary | ICD-10-CM | POA: Diagnosis not present

## 2021-08-20 DIAGNOSIS — J4521 Mild intermittent asthma with (acute) exacerbation: Secondary | ICD-10-CM | POA: Diagnosis not present

## 2021-08-20 DIAGNOSIS — K219 Gastro-esophageal reflux disease without esophagitis: Secondary | ICD-10-CM | POA: Diagnosis not present

## 2021-08-20 DIAGNOSIS — S8264XD Nondisplaced fracture of lateral malleolus of right fibula, subsequent encounter for closed fracture with routine healing: Secondary | ICD-10-CM | POA: Diagnosis not present

## 2021-08-20 DIAGNOSIS — E785 Hyperlipidemia, unspecified: Secondary | ICD-10-CM | POA: Diagnosis not present

## 2021-08-20 DIAGNOSIS — K5903 Drug induced constipation: Secondary | ICD-10-CM | POA: Diagnosis not present

## 2021-08-20 DIAGNOSIS — I700 Atherosclerosis of aorta: Secondary | ICD-10-CM | POA: Diagnosis not present

## 2021-08-20 DIAGNOSIS — S82142D Displaced bicondylar fracture of left tibia, subsequent encounter for closed fracture with routine healing: Secondary | ICD-10-CM | POA: Diagnosis not present

## 2021-08-20 DIAGNOSIS — E039 Hypothyroidism, unspecified: Secondary | ICD-10-CM | POA: Diagnosis not present

## 2021-08-20 DIAGNOSIS — S82852D Displaced trimalleolar fracture of left lower leg, subsequent encounter for closed fracture with routine healing: Secondary | ICD-10-CM | POA: Diagnosis not present

## 2021-08-20 DIAGNOSIS — M5416 Radiculopathy, lumbar region: Secondary | ICD-10-CM | POA: Diagnosis not present

## 2021-08-20 DIAGNOSIS — D62 Acute posthemorrhagic anemia: Secondary | ICD-10-CM | POA: Diagnosis not present

## 2021-08-20 DIAGNOSIS — S42292D Other displaced fracture of upper end of left humerus, subsequent encounter for fracture with routine healing: Secondary | ICD-10-CM | POA: Diagnosis not present

## 2021-08-20 DIAGNOSIS — S92354D Nondisplaced fracture of fifth metatarsal bone, right foot, subsequent encounter for fracture with routine healing: Secondary | ICD-10-CM | POA: Diagnosis not present

## 2021-08-20 DIAGNOSIS — I10 Essential (primary) hypertension: Secondary | ICD-10-CM | POA: Diagnosis not present

## 2021-08-20 DIAGNOSIS — F4322 Adjustment disorder with anxiety: Secondary | ICD-10-CM | POA: Diagnosis not present

## 2021-08-21 ENCOUNTER — Telehealth: Payer: Self-pay | Admitting: Family Medicine

## 2021-08-21 DIAGNOSIS — D62 Acute posthemorrhagic anemia: Secondary | ICD-10-CM | POA: Diagnosis not present

## 2021-08-21 DIAGNOSIS — I10 Essential (primary) hypertension: Secondary | ICD-10-CM | POA: Diagnosis not present

## 2021-08-21 DIAGNOSIS — K5903 Drug induced constipation: Secondary | ICD-10-CM | POA: Diagnosis not present

## 2021-08-21 DIAGNOSIS — I700 Atherosclerosis of aorta: Secondary | ICD-10-CM | POA: Diagnosis not present

## 2021-08-21 DIAGNOSIS — S82852D Displaced trimalleolar fracture of left lower leg, subsequent encounter for closed fracture with routine healing: Secondary | ICD-10-CM | POA: Diagnosis not present

## 2021-08-21 DIAGNOSIS — M5416 Radiculopathy, lumbar region: Secondary | ICD-10-CM | POA: Diagnosis not present

## 2021-08-21 DIAGNOSIS — S92354D Nondisplaced fracture of fifth metatarsal bone, right foot, subsequent encounter for fracture with routine healing: Secondary | ICD-10-CM | POA: Diagnosis not present

## 2021-08-21 DIAGNOSIS — E785 Hyperlipidemia, unspecified: Secondary | ICD-10-CM | POA: Diagnosis not present

## 2021-08-21 DIAGNOSIS — J4521 Mild intermittent asthma with (acute) exacerbation: Secondary | ICD-10-CM | POA: Diagnosis not present

## 2021-08-21 DIAGNOSIS — M810 Age-related osteoporosis without current pathological fracture: Secondary | ICD-10-CM | POA: Diagnosis not present

## 2021-08-21 DIAGNOSIS — S8264XD Nondisplaced fracture of lateral malleolus of right fibula, subsequent encounter for closed fracture with routine healing: Secondary | ICD-10-CM | POA: Diagnosis not present

## 2021-08-21 DIAGNOSIS — K219 Gastro-esophageal reflux disease without esophagitis: Secondary | ICD-10-CM | POA: Diagnosis not present

## 2021-08-21 DIAGNOSIS — F4322 Adjustment disorder with anxiety: Secondary | ICD-10-CM | POA: Diagnosis not present

## 2021-08-21 DIAGNOSIS — E039 Hypothyroidism, unspecified: Secondary | ICD-10-CM | POA: Diagnosis not present

## 2021-08-21 DIAGNOSIS — S42292D Other displaced fracture of upper end of left humerus, subsequent encounter for fracture with routine healing: Secondary | ICD-10-CM | POA: Diagnosis not present

## 2021-08-21 DIAGNOSIS — S82142D Displaced bicondylar fracture of left tibia, subsequent encounter for closed fracture with routine healing: Secondary | ICD-10-CM | POA: Diagnosis not present

## 2021-08-21 NOTE — Telephone Encounter (Signed)
Patient states her cephALEXin (KEFLEX) 500 MG capsule  medication is causing her diarrhea and would like to know what other medication she can take. Please advice.

## 2021-08-21 NOTE — Telephone Encounter (Addendum)
Cephalexin was prescribed 08/10/2021   for acute cystitis without hematuria.  If she was not able to finish the treatment, send doxycycline 100 mg twice daily #10 no refills. If she is worse, fever chills, needs to be seen in person

## 2021-08-21 NOTE — Telephone Encounter (Signed)
Pt had a virtual with Melissa on 12/16 and was treated for Acute cystitis without hematuria and was given Keflex. Please advise

## 2021-08-21 NOTE — Telephone Encounter (Signed)
Spoke with pt. Pt has concerns that the doxycycline will cause the same issue. Pt states she will try it if you feel different but states she has had Macrobid before without any problem. Please advise

## 2021-08-21 NOTE — Telephone Encounter (Incomplete Revision)
Cephalexin was prescribed 08/10/2021   for acute cystitis without hematuria.  If she was not able to finish the treatment, send doxycycline 100 mg twice daily #10 no refills. If she is worse, fever chills, needs to be seen in person

## 2021-08-22 ENCOUNTER — Encounter: Payer: Self-pay | Admitting: Family Medicine

## 2021-08-22 DIAGNOSIS — M5416 Radiculopathy, lumbar region: Secondary | ICD-10-CM | POA: Diagnosis not present

## 2021-08-22 DIAGNOSIS — S92354D Nondisplaced fracture of fifth metatarsal bone, right foot, subsequent encounter for fracture with routine healing: Secondary | ICD-10-CM | POA: Diagnosis not present

## 2021-08-22 DIAGNOSIS — S42292D Other displaced fracture of upper end of left humerus, subsequent encounter for fracture with routine healing: Secondary | ICD-10-CM | POA: Diagnosis not present

## 2021-08-22 DIAGNOSIS — I700 Atherosclerosis of aorta: Secondary | ICD-10-CM | POA: Diagnosis not present

## 2021-08-22 DIAGNOSIS — S82852D Displaced trimalleolar fracture of left lower leg, subsequent encounter for closed fracture with routine healing: Secondary | ICD-10-CM | POA: Diagnosis not present

## 2021-08-22 DIAGNOSIS — D62 Acute posthemorrhagic anemia: Secondary | ICD-10-CM | POA: Diagnosis not present

## 2021-08-22 DIAGNOSIS — K5903 Drug induced constipation: Secondary | ICD-10-CM | POA: Diagnosis not present

## 2021-08-22 DIAGNOSIS — I10 Essential (primary) hypertension: Secondary | ICD-10-CM | POA: Diagnosis not present

## 2021-08-22 DIAGNOSIS — S82142D Displaced bicondylar fracture of left tibia, subsequent encounter for closed fracture with routine healing: Secondary | ICD-10-CM | POA: Diagnosis not present

## 2021-08-22 DIAGNOSIS — R309 Painful micturition, unspecified: Secondary | ICD-10-CM | POA: Diagnosis not present

## 2021-08-22 DIAGNOSIS — E039 Hypothyroidism, unspecified: Secondary | ICD-10-CM | POA: Diagnosis not present

## 2021-08-22 DIAGNOSIS — J4521 Mild intermittent asthma with (acute) exacerbation: Secondary | ICD-10-CM | POA: Diagnosis not present

## 2021-08-22 DIAGNOSIS — M810 Age-related osteoporosis without current pathological fracture: Secondary | ICD-10-CM | POA: Diagnosis not present

## 2021-08-22 DIAGNOSIS — S8264XD Nondisplaced fracture of lateral malleolus of right fibula, subsequent encounter for closed fracture with routine healing: Secondary | ICD-10-CM | POA: Diagnosis not present

## 2021-08-22 DIAGNOSIS — K219 Gastro-esophageal reflux disease without esophagitis: Secondary | ICD-10-CM | POA: Diagnosis not present

## 2021-08-22 DIAGNOSIS — F4322 Adjustment disorder with anxiety: Secondary | ICD-10-CM | POA: Diagnosis not present

## 2021-08-22 DIAGNOSIS — E785 Hyperlipidemia, unspecified: Secondary | ICD-10-CM | POA: Diagnosis not present

## 2021-08-22 MED ORDER — DOXYCYCLINE HYCLATE 100 MG PO CAPS: 100.0000 mg | ORAL_CAPSULE | Freq: Two times a day (BID) | ORAL | 0 refills | Status: DC

## 2021-08-22 NOTE — Telephone Encounter (Signed)
[facility]: [phone] called and stated have nurse coming to home today at 2pm to check in with pt. Wanted to see if it was okay to get order placed in for a UA so they can check urine to make sure she is on right antibiotic if pt has UTI.

## 2021-08-22 NOTE — Telephone Encounter (Signed)
Spoke with [employer]. Verbal orders given.

## 2021-08-22 NOTE — Telephone Encounter (Signed)
Doxy sent in. Pt unable to get out of home easily due recent hospitalizations and surgeries.

## 2021-08-22 NOTE — Telephone Encounter (Signed)
She was seen 10 days ago virtually, was treated empirically with Keflex for a UTI. Unable to tolerate Keflex, does not like to try doxycycline. Was recommended to come in person if not improving Advise patient: Needs a visit in person.

## 2021-08-23 DIAGNOSIS — M810 Age-related osteoporosis without current pathological fracture: Secondary | ICD-10-CM | POA: Diagnosis not present

## 2021-08-24 DIAGNOSIS — I10 Essential (primary) hypertension: Secondary | ICD-10-CM | POA: Diagnosis not present

## 2021-08-24 DIAGNOSIS — D62 Acute posthemorrhagic anemia: Secondary | ICD-10-CM | POA: Diagnosis not present

## 2021-08-24 DIAGNOSIS — S42292D Other displaced fracture of upper end of left humerus, subsequent encounter for fracture with routine healing: Secondary | ICD-10-CM | POA: Diagnosis not present

## 2021-08-24 DIAGNOSIS — I700 Atherosclerosis of aorta: Secondary | ICD-10-CM | POA: Diagnosis not present

## 2021-08-24 DIAGNOSIS — K5903 Drug induced constipation: Secondary | ICD-10-CM | POA: Diagnosis not present

## 2021-08-24 DIAGNOSIS — E785 Hyperlipidemia, unspecified: Secondary | ICD-10-CM | POA: Diagnosis not present

## 2021-08-24 DIAGNOSIS — M810 Age-related osteoporosis without current pathological fracture: Secondary | ICD-10-CM | POA: Diagnosis not present

## 2021-08-24 DIAGNOSIS — S82142D Displaced bicondylar fracture of left tibia, subsequent encounter for closed fracture with routine healing: Secondary | ICD-10-CM | POA: Diagnosis not present

## 2021-08-24 DIAGNOSIS — S82852D Displaced trimalleolar fracture of left lower leg, subsequent encounter for closed fracture with routine healing: Secondary | ICD-10-CM | POA: Diagnosis not present

## 2021-08-24 DIAGNOSIS — J4521 Mild intermittent asthma with (acute) exacerbation: Secondary | ICD-10-CM | POA: Diagnosis not present

## 2021-08-24 DIAGNOSIS — S92354D Nondisplaced fracture of fifth metatarsal bone, right foot, subsequent encounter for fracture with routine healing: Secondary | ICD-10-CM | POA: Diagnosis not present

## 2021-08-24 DIAGNOSIS — M5416 Radiculopathy, lumbar region: Secondary | ICD-10-CM | POA: Diagnosis not present

## 2021-08-24 DIAGNOSIS — F4322 Adjustment disorder with anxiety: Secondary | ICD-10-CM | POA: Diagnosis not present

## 2021-08-24 DIAGNOSIS — K219 Gastro-esophageal reflux disease without esophagitis: Secondary | ICD-10-CM | POA: Diagnosis not present

## 2021-08-24 DIAGNOSIS — S8264XD Nondisplaced fracture of lateral malleolus of right fibula, subsequent encounter for closed fracture with routine healing: Secondary | ICD-10-CM | POA: Diagnosis not present

## 2021-08-24 DIAGNOSIS — E039 Hypothyroidism, unspecified: Secondary | ICD-10-CM | POA: Diagnosis not present

## 2021-08-27 DIAGNOSIS — F4322 Adjustment disorder with anxiety: Secondary | ICD-10-CM | POA: Diagnosis not present

## 2021-08-27 DIAGNOSIS — S8264XD Nondisplaced fracture of lateral malleolus of right fibula, subsequent encounter for closed fracture with routine healing: Secondary | ICD-10-CM | POA: Diagnosis not present

## 2021-08-27 DIAGNOSIS — S42292D Other displaced fracture of upper end of left humerus, subsequent encounter for fracture with routine healing: Secondary | ICD-10-CM | POA: Diagnosis not present

## 2021-08-27 DIAGNOSIS — E039 Hypothyroidism, unspecified: Secondary | ICD-10-CM | POA: Diagnosis not present

## 2021-08-27 DIAGNOSIS — J4521 Mild intermittent asthma with (acute) exacerbation: Secondary | ICD-10-CM | POA: Diagnosis not present

## 2021-08-27 DIAGNOSIS — K5903 Drug induced constipation: Secondary | ICD-10-CM | POA: Diagnosis not present

## 2021-08-27 DIAGNOSIS — M5416 Radiculopathy, lumbar region: Secondary | ICD-10-CM | POA: Diagnosis not present

## 2021-08-27 DIAGNOSIS — S82852D Displaced trimalleolar fracture of left lower leg, subsequent encounter for closed fracture with routine healing: Secondary | ICD-10-CM | POA: Diagnosis not present

## 2021-08-27 DIAGNOSIS — E785 Hyperlipidemia, unspecified: Secondary | ICD-10-CM | POA: Diagnosis not present

## 2021-08-27 DIAGNOSIS — D62 Acute posthemorrhagic anemia: Secondary | ICD-10-CM | POA: Diagnosis not present

## 2021-08-27 DIAGNOSIS — M810 Age-related osteoporosis without current pathological fracture: Secondary | ICD-10-CM | POA: Diagnosis not present

## 2021-08-27 DIAGNOSIS — K219 Gastro-esophageal reflux disease without esophagitis: Secondary | ICD-10-CM | POA: Diagnosis not present

## 2021-08-27 DIAGNOSIS — S82142D Displaced bicondylar fracture of left tibia, subsequent encounter for closed fracture with routine healing: Secondary | ICD-10-CM | POA: Diagnosis not present

## 2021-08-27 DIAGNOSIS — S92354D Nondisplaced fracture of fifth metatarsal bone, right foot, subsequent encounter for fracture with routine healing: Secondary | ICD-10-CM | POA: Diagnosis not present

## 2021-08-27 DIAGNOSIS — I700 Atherosclerosis of aorta: Secondary | ICD-10-CM | POA: Diagnosis not present

## 2021-08-27 DIAGNOSIS — I10 Essential (primary) hypertension: Secondary | ICD-10-CM | POA: Diagnosis not present

## 2021-08-28 ENCOUNTER — Other Ambulatory Visit (HOSPITAL_COMMUNITY): Payer: Self-pay | Admitting: Orthopaedic Surgery

## 2021-08-28 ENCOUNTER — Other Ambulatory Visit: Payer: Self-pay

## 2021-08-28 ENCOUNTER — Ambulatory Visit (HOSPITAL_COMMUNITY)
Admission: RE | Admit: 2021-08-28 | Discharge: 2021-08-28 | Disposition: A | Discharge status: Home / Self Care | Payer: BC Managed Care – PPO | Source: Ambulatory Visit | Attending: Internal Medicine | Admitting: Internal Medicine

## 2021-08-28 DIAGNOSIS — M7989 Other specified soft tissue disorders: Secondary | ICD-10-CM

## 2021-08-28 DIAGNOSIS — M79662 Pain in left lower leg: Secondary | ICD-10-CM | POA: Insufficient documentation

## 2021-08-28 DIAGNOSIS — S42252A Displaced fracture of greater tuberosity of left humerus, initial encounter for closed fracture: Secondary | ICD-10-CM | POA: Diagnosis not present

## 2021-08-29 ENCOUNTER — Encounter: Payer: Self-pay | Admitting: Family Medicine

## 2021-08-29 NOTE — Telephone Encounter (Signed)
Received UA results from [facility], performed by [facility] on 08/22/2021.  Urinalysis, Routine is normal.  Urine Culture, Routine is abnormal.  Result 1: Abnormal, Escherichia coli Result 2: Abnormal, Enerococcus faecalis  Both results resistant to Tetracycline.  Patient prescribed Doxycycline on 08/22/2021 after she was unable to finish Keflex Rx.   Printed result on CMA desk.

## 2021-08-30 ENCOUNTER — Other Ambulatory Visit: Payer: Self-pay | Admitting: Family Medicine

## 2021-08-30 DIAGNOSIS — N390 Urinary tract infection, site not specified: Secondary | ICD-10-CM

## 2021-08-30 MED ORDER — NITROFURANTOIN MONOHYD MACRO 100 MG PO CAPS: 100.0000 mg | ORAL_CAPSULE | Freq: Two times a day (BID) | ORAL | 0 refills | Status: DC

## 2021-08-30 NOTE — Telephone Encounter (Signed)
Results should be in the folders in your office

## 2021-08-31 DIAGNOSIS — I10 Essential (primary) hypertension: Secondary | ICD-10-CM | POA: Diagnosis not present

## 2021-08-31 DIAGNOSIS — S42292D Other displaced fracture of upper end of left humerus, subsequent encounter for fracture with routine healing: Secondary | ICD-10-CM | POA: Diagnosis not present

## 2021-08-31 DIAGNOSIS — I700 Atherosclerosis of aorta: Secondary | ICD-10-CM | POA: Diagnosis not present

## 2021-08-31 DIAGNOSIS — J4521 Mild intermittent asthma with (acute) exacerbation: Secondary | ICD-10-CM | POA: Diagnosis not present

## 2021-08-31 DIAGNOSIS — F4322 Adjustment disorder with anxiety: Secondary | ICD-10-CM | POA: Diagnosis not present

## 2021-08-31 DIAGNOSIS — E039 Hypothyroidism, unspecified: Secondary | ICD-10-CM | POA: Diagnosis not present

## 2021-08-31 DIAGNOSIS — M5416 Radiculopathy, lumbar region: Secondary | ICD-10-CM | POA: Diagnosis not present

## 2021-08-31 DIAGNOSIS — S8264XD Nondisplaced fracture of lateral malleolus of right fibula, subsequent encounter for closed fracture with routine healing: Secondary | ICD-10-CM | POA: Diagnosis not present

## 2021-08-31 DIAGNOSIS — S82852D Displaced trimalleolar fracture of left lower leg, subsequent encounter for closed fracture with routine healing: Secondary | ICD-10-CM | POA: Diagnosis not present

## 2021-08-31 DIAGNOSIS — M810 Age-related osteoporosis without current pathological fracture: Secondary | ICD-10-CM | POA: Diagnosis not present

## 2021-08-31 DIAGNOSIS — D62 Acute posthemorrhagic anemia: Secondary | ICD-10-CM | POA: Diagnosis not present

## 2021-08-31 DIAGNOSIS — S82142D Displaced bicondylar fracture of left tibia, subsequent encounter for closed fracture with routine healing: Secondary | ICD-10-CM | POA: Diagnosis not present

## 2021-08-31 DIAGNOSIS — S92354D Nondisplaced fracture of fifth metatarsal bone, right foot, subsequent encounter for fracture with routine healing: Secondary | ICD-10-CM | POA: Diagnosis not present

## 2021-08-31 DIAGNOSIS — K5903 Drug induced constipation: Secondary | ICD-10-CM | POA: Diagnosis not present

## 2021-08-31 DIAGNOSIS — K219 Gastro-esophageal reflux disease without esophagitis: Secondary | ICD-10-CM | POA: Diagnosis not present

## 2021-08-31 DIAGNOSIS — E785 Hyperlipidemia, unspecified: Secondary | ICD-10-CM | POA: Diagnosis not present

## 2021-08-31 NOTE — Telephone Encounter (Signed)
Med sent by Laury Axon

## 2021-09-02 DIAGNOSIS — M810 Age-related osteoporosis without current pathological fracture: Secondary | ICD-10-CM | POA: Diagnosis not present

## 2021-09-02 DIAGNOSIS — S82142A Displaced bicondylar fracture of left tibia, initial encounter for closed fracture: Secondary | ICD-10-CM | POA: Diagnosis not present

## 2021-09-02 DIAGNOSIS — T07XXXA Unspecified multiple injuries, initial encounter: Secondary | ICD-10-CM | POA: Diagnosis not present

## 2021-09-02 DIAGNOSIS — S42309A Unspecified fracture of shaft of humerus, unspecified arm, initial encounter for closed fracture: Secondary | ICD-10-CM | POA: Diagnosis not present

## 2021-09-03 DIAGNOSIS — F4322 Adjustment disorder with anxiety: Secondary | ICD-10-CM | POA: Diagnosis not present

## 2021-09-03 DIAGNOSIS — J4521 Mild intermittent asthma with (acute) exacerbation: Secondary | ICD-10-CM | POA: Diagnosis not present

## 2021-09-03 DIAGNOSIS — D62 Acute posthemorrhagic anemia: Secondary | ICD-10-CM | POA: Diagnosis not present

## 2021-09-03 DIAGNOSIS — E039 Hypothyroidism, unspecified: Secondary | ICD-10-CM | POA: Diagnosis not present

## 2021-09-03 DIAGNOSIS — S8264XD Nondisplaced fracture of lateral malleolus of right fibula, subsequent encounter for closed fracture with routine healing: Secondary | ICD-10-CM | POA: Diagnosis not present

## 2021-09-03 DIAGNOSIS — M810 Age-related osteoporosis without current pathological fracture: Secondary | ICD-10-CM | POA: Diagnosis not present

## 2021-09-03 DIAGNOSIS — I700 Atherosclerosis of aorta: Secondary | ICD-10-CM | POA: Diagnosis not present

## 2021-09-03 DIAGNOSIS — S82852D Displaced trimalleolar fracture of left lower leg, subsequent encounter for closed fracture with routine healing: Secondary | ICD-10-CM | POA: Diagnosis not present

## 2021-09-03 DIAGNOSIS — K219 Gastro-esophageal reflux disease without esophagitis: Secondary | ICD-10-CM | POA: Diagnosis not present

## 2021-09-03 DIAGNOSIS — I10 Essential (primary) hypertension: Secondary | ICD-10-CM | POA: Diagnosis not present

## 2021-09-03 DIAGNOSIS — S42292D Other displaced fracture of upper end of left humerus, subsequent encounter for fracture with routine healing: Secondary | ICD-10-CM | POA: Diagnosis not present

## 2021-09-03 DIAGNOSIS — S92354D Nondisplaced fracture of fifth metatarsal bone, right foot, subsequent encounter for fracture with routine healing: Secondary | ICD-10-CM | POA: Diagnosis not present

## 2021-09-03 DIAGNOSIS — K5903 Drug induced constipation: Secondary | ICD-10-CM | POA: Diagnosis not present

## 2021-09-03 DIAGNOSIS — M5416 Radiculopathy, lumbar region: Secondary | ICD-10-CM | POA: Diagnosis not present

## 2021-09-03 DIAGNOSIS — E785 Hyperlipidemia, unspecified: Secondary | ICD-10-CM | POA: Diagnosis not present

## 2021-09-03 DIAGNOSIS — S82142D Displaced bicondylar fracture of left tibia, subsequent encounter for closed fracture with routine healing: Secondary | ICD-10-CM | POA: Diagnosis not present

## 2021-09-05 DIAGNOSIS — E785 Hyperlipidemia, unspecified: Secondary | ICD-10-CM | POA: Diagnosis not present

## 2021-09-05 DIAGNOSIS — M810 Age-related osteoporosis without current pathological fracture: Secondary | ICD-10-CM | POA: Diagnosis not present

## 2021-09-05 DIAGNOSIS — S82852D Displaced trimalleolar fracture of left lower leg, subsequent encounter for closed fracture with routine healing: Secondary | ICD-10-CM | POA: Diagnosis not present

## 2021-09-05 DIAGNOSIS — F4322 Adjustment disorder with anxiety: Secondary | ICD-10-CM | POA: Diagnosis not present

## 2021-09-05 DIAGNOSIS — M5416 Radiculopathy, lumbar region: Secondary | ICD-10-CM | POA: Diagnosis not present

## 2021-09-05 DIAGNOSIS — S92354D Nondisplaced fracture of fifth metatarsal bone, right foot, subsequent encounter for fracture with routine healing: Secondary | ICD-10-CM | POA: Diagnosis not present

## 2021-09-05 DIAGNOSIS — J4521 Mild intermittent asthma with (acute) exacerbation: Secondary | ICD-10-CM | POA: Diagnosis not present

## 2021-09-05 DIAGNOSIS — K5903 Drug induced constipation: Secondary | ICD-10-CM | POA: Diagnosis not present

## 2021-09-05 DIAGNOSIS — I10 Essential (primary) hypertension: Secondary | ICD-10-CM | POA: Diagnosis not present

## 2021-09-05 DIAGNOSIS — K219 Gastro-esophageal reflux disease without esophagitis: Secondary | ICD-10-CM | POA: Diagnosis not present

## 2021-09-05 DIAGNOSIS — I700 Atherosclerosis of aorta: Secondary | ICD-10-CM | POA: Diagnosis not present

## 2021-09-05 DIAGNOSIS — S8264XD Nondisplaced fracture of lateral malleolus of right fibula, subsequent encounter for closed fracture with routine healing: Secondary | ICD-10-CM | POA: Diagnosis not present

## 2021-09-05 DIAGNOSIS — S42292D Other displaced fracture of upper end of left humerus, subsequent encounter for fracture with routine healing: Secondary | ICD-10-CM | POA: Diagnosis not present

## 2021-09-05 DIAGNOSIS — D62 Acute posthemorrhagic anemia: Secondary | ICD-10-CM | POA: Diagnosis not present

## 2021-09-05 DIAGNOSIS — E039 Hypothyroidism, unspecified: Secondary | ICD-10-CM | POA: Diagnosis not present

## 2021-09-05 DIAGNOSIS — S82142D Displaced bicondylar fracture of left tibia, subsequent encounter for closed fracture with routine healing: Secondary | ICD-10-CM | POA: Diagnosis not present

## 2021-09-06 DIAGNOSIS — K5903 Drug induced constipation: Secondary | ICD-10-CM | POA: Diagnosis not present

## 2021-09-06 DIAGNOSIS — J4521 Mild intermittent asthma with (acute) exacerbation: Secondary | ICD-10-CM | POA: Diagnosis not present

## 2021-09-06 DIAGNOSIS — M5416 Radiculopathy, lumbar region: Secondary | ICD-10-CM | POA: Diagnosis not present

## 2021-09-06 DIAGNOSIS — S8264XD Nondisplaced fracture of lateral malleolus of right fibula, subsequent encounter for closed fracture with routine healing: Secondary | ICD-10-CM | POA: Diagnosis not present

## 2021-09-06 DIAGNOSIS — E785 Hyperlipidemia, unspecified: Secondary | ICD-10-CM | POA: Diagnosis not present

## 2021-09-06 DIAGNOSIS — I700 Atherosclerosis of aorta: Secondary | ICD-10-CM | POA: Diagnosis not present

## 2021-09-06 DIAGNOSIS — I10 Essential (primary) hypertension: Secondary | ICD-10-CM | POA: Diagnosis not present

## 2021-09-06 DIAGNOSIS — F4322 Adjustment disorder with anxiety: Secondary | ICD-10-CM | POA: Diagnosis not present

## 2021-09-06 DIAGNOSIS — S92354D Nondisplaced fracture of fifth metatarsal bone, right foot, subsequent encounter for fracture with routine healing: Secondary | ICD-10-CM | POA: Diagnosis not present

## 2021-09-06 DIAGNOSIS — S82142D Displaced bicondylar fracture of left tibia, subsequent encounter for closed fracture with routine healing: Secondary | ICD-10-CM | POA: Diagnosis not present

## 2021-09-06 DIAGNOSIS — M810 Age-related osteoporosis without current pathological fracture: Secondary | ICD-10-CM | POA: Diagnosis not present

## 2021-09-06 DIAGNOSIS — D62 Acute posthemorrhagic anemia: Secondary | ICD-10-CM | POA: Diagnosis not present

## 2021-09-06 DIAGNOSIS — S42292D Other displaced fracture of upper end of left humerus, subsequent encounter for fracture with routine healing: Secondary | ICD-10-CM | POA: Diagnosis not present

## 2021-09-06 DIAGNOSIS — E039 Hypothyroidism, unspecified: Secondary | ICD-10-CM | POA: Diagnosis not present

## 2021-09-06 DIAGNOSIS — K219 Gastro-esophageal reflux disease without esophagitis: Secondary | ICD-10-CM | POA: Diagnosis not present

## 2021-09-06 DIAGNOSIS — S82852D Displaced trimalleolar fracture of left lower leg, subsequent encounter for closed fracture with routine healing: Secondary | ICD-10-CM | POA: Diagnosis not present

## 2021-09-07 DIAGNOSIS — I10 Essential (primary) hypertension: Secondary | ICD-10-CM | POA: Diagnosis not present

## 2021-09-07 DIAGNOSIS — S82852D Displaced trimalleolar fracture of left lower leg, subsequent encounter for closed fracture with routine healing: Secondary | ICD-10-CM | POA: Diagnosis not present

## 2021-09-07 DIAGNOSIS — E039 Hypothyroidism, unspecified: Secondary | ICD-10-CM | POA: Diagnosis not present

## 2021-09-07 DIAGNOSIS — K219 Gastro-esophageal reflux disease without esophagitis: Secondary | ICD-10-CM | POA: Diagnosis not present

## 2021-09-07 DIAGNOSIS — K5903 Drug induced constipation: Secondary | ICD-10-CM | POA: Diagnosis not present

## 2021-09-07 DIAGNOSIS — S42292D Other displaced fracture of upper end of left humerus, subsequent encounter for fracture with routine healing: Secondary | ICD-10-CM | POA: Diagnosis not present

## 2021-09-07 DIAGNOSIS — I700 Atherosclerosis of aorta: Secondary | ICD-10-CM | POA: Diagnosis not present

## 2021-09-07 DIAGNOSIS — E785 Hyperlipidemia, unspecified: Secondary | ICD-10-CM | POA: Diagnosis not present

## 2021-09-07 DIAGNOSIS — M5416 Radiculopathy, lumbar region: Secondary | ICD-10-CM | POA: Diagnosis not present

## 2021-09-07 DIAGNOSIS — M810 Age-related osteoporosis without current pathological fracture: Secondary | ICD-10-CM | POA: Diagnosis not present

## 2021-09-07 DIAGNOSIS — F4322 Adjustment disorder with anxiety: Secondary | ICD-10-CM | POA: Diagnosis not present

## 2021-09-07 DIAGNOSIS — S92354D Nondisplaced fracture of fifth metatarsal bone, right foot, subsequent encounter for fracture with routine healing: Secondary | ICD-10-CM | POA: Diagnosis not present

## 2021-09-07 DIAGNOSIS — D62 Acute posthemorrhagic anemia: Secondary | ICD-10-CM | POA: Diagnosis not present

## 2021-09-07 DIAGNOSIS — S82142D Displaced bicondylar fracture of left tibia, subsequent encounter for closed fracture with routine healing: Secondary | ICD-10-CM | POA: Diagnosis not present

## 2021-09-07 DIAGNOSIS — S8264XD Nondisplaced fracture of lateral malleolus of right fibula, subsequent encounter for closed fracture with routine healing: Secondary | ICD-10-CM | POA: Diagnosis not present

## 2021-09-07 DIAGNOSIS — J4521 Mild intermittent asthma with (acute) exacerbation: Secondary | ICD-10-CM | POA: Diagnosis not present

## 2021-09-10 DIAGNOSIS — E039 Hypothyroidism, unspecified: Secondary | ICD-10-CM | POA: Diagnosis not present

## 2021-09-10 DIAGNOSIS — F4322 Adjustment disorder with anxiety: Secondary | ICD-10-CM | POA: Diagnosis not present

## 2021-09-10 DIAGNOSIS — E785 Hyperlipidemia, unspecified: Secondary | ICD-10-CM | POA: Diagnosis not present

## 2021-09-10 DIAGNOSIS — I10 Essential (primary) hypertension: Secondary | ICD-10-CM | POA: Diagnosis not present

## 2021-09-10 DIAGNOSIS — S82142D Displaced bicondylar fracture of left tibia, subsequent encounter for closed fracture with routine healing: Secondary | ICD-10-CM | POA: Diagnosis not present

## 2021-09-10 DIAGNOSIS — J4521 Mild intermittent asthma with (acute) exacerbation: Secondary | ICD-10-CM | POA: Diagnosis not present

## 2021-09-10 DIAGNOSIS — K5903 Drug induced constipation: Secondary | ICD-10-CM | POA: Diagnosis not present

## 2021-09-10 DIAGNOSIS — K219 Gastro-esophageal reflux disease without esophagitis: Secondary | ICD-10-CM | POA: Diagnosis not present

## 2021-09-10 DIAGNOSIS — M5416 Radiculopathy, lumbar region: Secondary | ICD-10-CM | POA: Diagnosis not present

## 2021-09-10 DIAGNOSIS — S92354D Nondisplaced fracture of fifth metatarsal bone, right foot, subsequent encounter for fracture with routine healing: Secondary | ICD-10-CM | POA: Diagnosis not present

## 2021-09-10 DIAGNOSIS — S82852D Displaced trimalleolar fracture of left lower leg, subsequent encounter for closed fracture with routine healing: Secondary | ICD-10-CM | POA: Diagnosis not present

## 2021-09-10 DIAGNOSIS — S8264XD Nondisplaced fracture of lateral malleolus of right fibula, subsequent encounter for closed fracture with routine healing: Secondary | ICD-10-CM | POA: Diagnosis not present

## 2021-09-10 DIAGNOSIS — M810 Age-related osteoporosis without current pathological fracture: Secondary | ICD-10-CM | POA: Diagnosis not present

## 2021-09-10 DIAGNOSIS — S42292D Other displaced fracture of upper end of left humerus, subsequent encounter for fracture with routine healing: Secondary | ICD-10-CM | POA: Diagnosis not present

## 2021-09-10 DIAGNOSIS — D62 Acute posthemorrhagic anemia: Secondary | ICD-10-CM | POA: Diagnosis not present

## 2021-09-10 DIAGNOSIS — I700 Atherosclerosis of aorta: Secondary | ICD-10-CM | POA: Diagnosis not present

## 2021-09-11 DIAGNOSIS — Z780 Asymptomatic menopausal state: Secondary | ICD-10-CM | POA: Diagnosis not present

## 2021-09-11 DIAGNOSIS — M810 Age-related osteoporosis without current pathological fracture: Secondary | ICD-10-CM | POA: Diagnosis not present

## 2021-09-11 LAB — HM DEXA SCAN

## 2021-09-12 ENCOUNTER — Encounter: Payer: Self-pay | Admitting: Family Medicine

## 2021-09-12 DIAGNOSIS — M810 Age-related osteoporosis without current pathological fracture: Secondary | ICD-10-CM | POA: Diagnosis not present

## 2021-09-12 DIAGNOSIS — K5903 Drug induced constipation: Secondary | ICD-10-CM | POA: Diagnosis not present

## 2021-09-12 DIAGNOSIS — J4521 Mild intermittent asthma with (acute) exacerbation: Secondary | ICD-10-CM | POA: Diagnosis not present

## 2021-09-12 DIAGNOSIS — K219 Gastro-esophageal reflux disease without esophagitis: Secondary | ICD-10-CM | POA: Diagnosis not present

## 2021-09-12 DIAGNOSIS — S82132D Displaced fracture of medial condyle of left tibia, subsequent encounter for closed fracture with routine healing: Secondary | ICD-10-CM | POA: Diagnosis not present

## 2021-09-12 DIAGNOSIS — E785 Hyperlipidemia, unspecified: Secondary | ICD-10-CM | POA: Diagnosis not present

## 2021-09-12 DIAGNOSIS — S92354D Nondisplaced fracture of fifth metatarsal bone, right foot, subsequent encounter for fracture with routine healing: Secondary | ICD-10-CM | POA: Diagnosis not present

## 2021-09-12 DIAGNOSIS — S82142D Displaced bicondylar fracture of left tibia, subsequent encounter for closed fracture with routine healing: Secondary | ICD-10-CM | POA: Diagnosis not present

## 2021-09-12 DIAGNOSIS — E039 Hypothyroidism, unspecified: Secondary | ICD-10-CM | POA: Diagnosis not present

## 2021-09-12 DIAGNOSIS — I700 Atherosclerosis of aorta: Secondary | ICD-10-CM | POA: Diagnosis not present

## 2021-09-12 DIAGNOSIS — I10 Essential (primary) hypertension: Secondary | ICD-10-CM | POA: Diagnosis not present

## 2021-09-12 DIAGNOSIS — S92351D Displaced fracture of fifth metatarsal bone, right foot, subsequent encounter for fracture with routine healing: Secondary | ICD-10-CM | POA: Diagnosis not present

## 2021-09-12 DIAGNOSIS — S93432D Sprain of tibiofibular ligament of left ankle, subsequent encounter: Secondary | ICD-10-CM | POA: Diagnosis not present

## 2021-09-12 DIAGNOSIS — S8264XD Nondisplaced fracture of lateral malleolus of right fibula, subsequent encounter for closed fracture with routine healing: Secondary | ICD-10-CM | POA: Diagnosis not present

## 2021-09-12 DIAGNOSIS — F4322 Adjustment disorder with anxiety: Secondary | ICD-10-CM | POA: Diagnosis not present

## 2021-09-12 DIAGNOSIS — S42292D Other displaced fracture of upper end of left humerus, subsequent encounter for fracture with routine healing: Secondary | ICD-10-CM | POA: Diagnosis not present

## 2021-09-12 DIAGNOSIS — S82852D Displaced trimalleolar fracture of left lower leg, subsequent encounter for closed fracture with routine healing: Secondary | ICD-10-CM | POA: Diagnosis not present

## 2021-09-12 DIAGNOSIS — M5416 Radiculopathy, lumbar region: Secondary | ICD-10-CM | POA: Diagnosis not present

## 2021-09-12 DIAGNOSIS — D62 Acute posthemorrhagic anemia: Secondary | ICD-10-CM | POA: Diagnosis not present

## 2021-09-17 DIAGNOSIS — E785 Hyperlipidemia, unspecified: Secondary | ICD-10-CM | POA: Diagnosis not present

## 2021-09-17 DIAGNOSIS — I700 Atherosclerosis of aorta: Secondary | ICD-10-CM | POA: Diagnosis not present

## 2021-09-17 DIAGNOSIS — S42292D Other displaced fracture of upper end of left humerus, subsequent encounter for fracture with routine healing: Secondary | ICD-10-CM | POA: Diagnosis not present

## 2021-09-17 DIAGNOSIS — D62 Acute posthemorrhagic anemia: Secondary | ICD-10-CM | POA: Diagnosis not present

## 2021-09-17 DIAGNOSIS — E039 Hypothyroidism, unspecified: Secondary | ICD-10-CM | POA: Diagnosis not present

## 2021-09-17 DIAGNOSIS — M810 Age-related osteoporosis without current pathological fracture: Secondary | ICD-10-CM | POA: Diagnosis not present

## 2021-09-17 DIAGNOSIS — S82852D Displaced trimalleolar fracture of left lower leg, subsequent encounter for closed fracture with routine healing: Secondary | ICD-10-CM | POA: Diagnosis not present

## 2021-09-17 DIAGNOSIS — S82142D Displaced bicondylar fracture of left tibia, subsequent encounter for closed fracture with routine healing: Secondary | ICD-10-CM | POA: Diagnosis not present

## 2021-09-17 DIAGNOSIS — J4521 Mild intermittent asthma with (acute) exacerbation: Secondary | ICD-10-CM | POA: Diagnosis not present

## 2021-09-17 DIAGNOSIS — S8264XD Nondisplaced fracture of lateral malleolus of right fibula, subsequent encounter for closed fracture with routine healing: Secondary | ICD-10-CM | POA: Diagnosis not present

## 2021-09-17 DIAGNOSIS — K219 Gastro-esophageal reflux disease without esophagitis: Secondary | ICD-10-CM | POA: Diagnosis not present

## 2021-09-17 DIAGNOSIS — M5416 Radiculopathy, lumbar region: Secondary | ICD-10-CM | POA: Diagnosis not present

## 2021-09-17 DIAGNOSIS — S92354D Nondisplaced fracture of fifth metatarsal bone, right foot, subsequent encounter for fracture with routine healing: Secondary | ICD-10-CM | POA: Diagnosis not present

## 2021-09-17 DIAGNOSIS — F4322 Adjustment disorder with anxiety: Secondary | ICD-10-CM | POA: Diagnosis not present

## 2021-09-17 DIAGNOSIS — K5903 Drug induced constipation: Secondary | ICD-10-CM | POA: Diagnosis not present

## 2021-09-17 DIAGNOSIS — I10 Essential (primary) hypertension: Secondary | ICD-10-CM | POA: Diagnosis not present

## 2021-09-18 ENCOUNTER — Telehealth: Payer: Self-pay

## 2021-09-18 ENCOUNTER — Other Ambulatory Visit: Payer: Self-pay

## 2021-09-18 NOTE — Telephone Encounter (Signed)
Spoke with patient. Pt states she did not start the Fosamax due to the medication having the opposite affect on her mother. Pt states she is seeing a Dr. Carola Frost (?)  and he has advised her to see another specialist regarding her Osteoporosis. Pt also states she doesn't want to go back on Prolia due to extreme muscle fatigue.

## 2021-09-19 DIAGNOSIS — S8264XD Nondisplaced fracture of lateral malleolus of right fibula, subsequent encounter for closed fracture with routine healing: Secondary | ICD-10-CM | POA: Diagnosis not present

## 2021-09-19 DIAGNOSIS — M5416 Radiculopathy, lumbar region: Secondary | ICD-10-CM | POA: Diagnosis not present

## 2021-09-19 DIAGNOSIS — S42292D Other displaced fracture of upper end of left humerus, subsequent encounter for fracture with routine healing: Secondary | ICD-10-CM | POA: Diagnosis not present

## 2021-09-19 DIAGNOSIS — I700 Atherosclerosis of aorta: Secondary | ICD-10-CM | POA: Diagnosis not present

## 2021-09-19 DIAGNOSIS — M810 Age-related osteoporosis without current pathological fracture: Secondary | ICD-10-CM | POA: Diagnosis not present

## 2021-09-19 DIAGNOSIS — S82852D Displaced trimalleolar fracture of left lower leg, subsequent encounter for closed fracture with routine healing: Secondary | ICD-10-CM | POA: Diagnosis not present

## 2021-09-19 DIAGNOSIS — I10 Essential (primary) hypertension: Secondary | ICD-10-CM | POA: Diagnosis not present

## 2021-09-19 DIAGNOSIS — K219 Gastro-esophageal reflux disease without esophagitis: Secondary | ICD-10-CM | POA: Diagnosis not present

## 2021-09-19 DIAGNOSIS — J4521 Mild intermittent asthma with (acute) exacerbation: Secondary | ICD-10-CM | POA: Diagnosis not present

## 2021-09-19 DIAGNOSIS — E785 Hyperlipidemia, unspecified: Secondary | ICD-10-CM | POA: Diagnosis not present

## 2021-09-19 DIAGNOSIS — S92354D Nondisplaced fracture of fifth metatarsal bone, right foot, subsequent encounter for fracture with routine healing: Secondary | ICD-10-CM | POA: Diagnosis not present

## 2021-09-19 DIAGNOSIS — K5903 Drug induced constipation: Secondary | ICD-10-CM | POA: Diagnosis not present

## 2021-09-19 DIAGNOSIS — F4322 Adjustment disorder with anxiety: Secondary | ICD-10-CM | POA: Diagnosis not present

## 2021-09-19 DIAGNOSIS — S82142D Displaced bicondylar fracture of left tibia, subsequent encounter for closed fracture with routine healing: Secondary | ICD-10-CM | POA: Diagnosis not present

## 2021-09-19 DIAGNOSIS — D62 Acute posthemorrhagic anemia: Secondary | ICD-10-CM | POA: Diagnosis not present

## 2021-09-19 DIAGNOSIS — E039 Hypothyroidism, unspecified: Secondary | ICD-10-CM | POA: Diagnosis not present

## 2021-09-19 NOTE — Telephone Encounter (Signed)
Noted  

## 2021-09-21 DIAGNOSIS — M810 Age-related osteoporosis without current pathological fracture: Secondary | ICD-10-CM | POA: Diagnosis not present

## 2021-09-21 DIAGNOSIS — I700 Atherosclerosis of aorta: Secondary | ICD-10-CM | POA: Diagnosis not present

## 2021-09-21 DIAGNOSIS — S92354D Nondisplaced fracture of fifth metatarsal bone, right foot, subsequent encounter for fracture with routine healing: Secondary | ICD-10-CM | POA: Diagnosis not present

## 2021-09-21 DIAGNOSIS — S82852D Displaced trimalleolar fracture of left lower leg, subsequent encounter for closed fracture with routine healing: Secondary | ICD-10-CM | POA: Diagnosis not present

## 2021-09-21 DIAGNOSIS — S42292D Other displaced fracture of upper end of left humerus, subsequent encounter for fracture with routine healing: Secondary | ICD-10-CM | POA: Diagnosis not present

## 2021-09-21 DIAGNOSIS — K5903 Drug induced constipation: Secondary | ICD-10-CM | POA: Diagnosis not present

## 2021-09-21 DIAGNOSIS — M5416 Radiculopathy, lumbar region: Secondary | ICD-10-CM | POA: Diagnosis not present

## 2021-09-21 DIAGNOSIS — D62 Acute posthemorrhagic anemia: Secondary | ICD-10-CM | POA: Diagnosis not present

## 2021-09-21 DIAGNOSIS — S8264XD Nondisplaced fracture of lateral malleolus of right fibula, subsequent encounter for closed fracture with routine healing: Secondary | ICD-10-CM | POA: Diagnosis not present

## 2021-09-21 DIAGNOSIS — E039 Hypothyroidism, unspecified: Secondary | ICD-10-CM | POA: Diagnosis not present

## 2021-09-21 DIAGNOSIS — I10 Essential (primary) hypertension: Secondary | ICD-10-CM | POA: Diagnosis not present

## 2021-09-21 DIAGNOSIS — J4521 Mild intermittent asthma with (acute) exacerbation: Secondary | ICD-10-CM | POA: Diagnosis not present

## 2021-09-21 DIAGNOSIS — E785 Hyperlipidemia, unspecified: Secondary | ICD-10-CM | POA: Diagnosis not present

## 2021-09-21 DIAGNOSIS — F4322 Adjustment disorder with anxiety: Secondary | ICD-10-CM | POA: Diagnosis not present

## 2021-09-21 DIAGNOSIS — S82142D Displaced bicondylar fracture of left tibia, subsequent encounter for closed fracture with routine healing: Secondary | ICD-10-CM | POA: Diagnosis not present

## 2021-09-21 DIAGNOSIS — K219 Gastro-esophageal reflux disease without esophagitis: Secondary | ICD-10-CM | POA: Diagnosis not present

## 2021-09-24 DIAGNOSIS — E785 Hyperlipidemia, unspecified: Secondary | ICD-10-CM | POA: Diagnosis not present

## 2021-09-24 DIAGNOSIS — S92354D Nondisplaced fracture of fifth metatarsal bone, right foot, subsequent encounter for fracture with routine healing: Secondary | ICD-10-CM | POA: Diagnosis not present

## 2021-09-24 DIAGNOSIS — J4521 Mild intermittent asthma with (acute) exacerbation: Secondary | ICD-10-CM | POA: Diagnosis not present

## 2021-09-24 DIAGNOSIS — I10 Essential (primary) hypertension: Secondary | ICD-10-CM | POA: Diagnosis not present

## 2021-09-24 DIAGNOSIS — S8264XD Nondisplaced fracture of lateral malleolus of right fibula, subsequent encounter for closed fracture with routine healing: Secondary | ICD-10-CM | POA: Diagnosis not present

## 2021-09-24 DIAGNOSIS — M810 Age-related osteoporosis without current pathological fracture: Secondary | ICD-10-CM | POA: Diagnosis not present

## 2021-09-24 DIAGNOSIS — S42292D Other displaced fracture of upper end of left humerus, subsequent encounter for fracture with routine healing: Secondary | ICD-10-CM | POA: Diagnosis not present

## 2021-09-24 DIAGNOSIS — E039 Hypothyroidism, unspecified: Secondary | ICD-10-CM | POA: Diagnosis not present

## 2021-09-24 DIAGNOSIS — I700 Atherosclerosis of aorta: Secondary | ICD-10-CM | POA: Diagnosis not present

## 2021-09-24 DIAGNOSIS — K5903 Drug induced constipation: Secondary | ICD-10-CM | POA: Diagnosis not present

## 2021-09-24 DIAGNOSIS — S82852D Displaced trimalleolar fracture of left lower leg, subsequent encounter for closed fracture with routine healing: Secondary | ICD-10-CM | POA: Diagnosis not present

## 2021-09-24 DIAGNOSIS — M5416 Radiculopathy, lumbar region: Secondary | ICD-10-CM | POA: Diagnosis not present

## 2021-09-24 DIAGNOSIS — F4322 Adjustment disorder with anxiety: Secondary | ICD-10-CM | POA: Diagnosis not present

## 2021-09-24 DIAGNOSIS — D62 Acute posthemorrhagic anemia: Secondary | ICD-10-CM | POA: Diagnosis not present

## 2021-09-24 DIAGNOSIS — S82142D Displaced bicondylar fracture of left tibia, subsequent encounter for closed fracture with routine healing: Secondary | ICD-10-CM | POA: Diagnosis not present

## 2021-09-24 DIAGNOSIS — K219 Gastro-esophageal reflux disease without esophagitis: Secondary | ICD-10-CM | POA: Diagnosis not present

## 2021-09-26 DIAGNOSIS — F4322 Adjustment disorder with anxiety: Secondary | ICD-10-CM | POA: Diagnosis not present

## 2021-09-26 DIAGNOSIS — I700 Atherosclerosis of aorta: Secondary | ICD-10-CM | POA: Diagnosis not present

## 2021-09-26 DIAGNOSIS — S82142D Displaced bicondylar fracture of left tibia, subsequent encounter for closed fracture with routine healing: Secondary | ICD-10-CM | POA: Diagnosis not present

## 2021-09-26 DIAGNOSIS — S42292D Other displaced fracture of upper end of left humerus, subsequent encounter for fracture with routine healing: Secondary | ICD-10-CM | POA: Diagnosis not present

## 2021-09-26 DIAGNOSIS — E039 Hypothyroidism, unspecified: Secondary | ICD-10-CM | POA: Diagnosis not present

## 2021-09-26 DIAGNOSIS — K5903 Drug induced constipation: Secondary | ICD-10-CM | POA: Diagnosis not present

## 2021-09-26 DIAGNOSIS — D62 Acute posthemorrhagic anemia: Secondary | ICD-10-CM | POA: Diagnosis not present

## 2021-09-26 DIAGNOSIS — E785 Hyperlipidemia, unspecified: Secondary | ICD-10-CM | POA: Diagnosis not present

## 2021-09-26 DIAGNOSIS — S92354D Nondisplaced fracture of fifth metatarsal bone, right foot, subsequent encounter for fracture with routine healing: Secondary | ICD-10-CM | POA: Diagnosis not present

## 2021-09-26 DIAGNOSIS — I10 Essential (primary) hypertension: Secondary | ICD-10-CM | POA: Diagnosis not present

## 2021-09-26 DIAGNOSIS — S82852D Displaced trimalleolar fracture of left lower leg, subsequent encounter for closed fracture with routine healing: Secondary | ICD-10-CM | POA: Diagnosis not present

## 2021-09-26 DIAGNOSIS — M810 Age-related osteoporosis without current pathological fracture: Secondary | ICD-10-CM | POA: Diagnosis not present

## 2021-09-26 DIAGNOSIS — J4521 Mild intermittent asthma with (acute) exacerbation: Secondary | ICD-10-CM | POA: Diagnosis not present

## 2021-09-26 DIAGNOSIS — S8264XD Nondisplaced fracture of lateral malleolus of right fibula, subsequent encounter for closed fracture with routine healing: Secondary | ICD-10-CM | POA: Diagnosis not present

## 2021-09-26 DIAGNOSIS — M5416 Radiculopathy, lumbar region: Secondary | ICD-10-CM | POA: Diagnosis not present

## 2021-09-26 DIAGNOSIS — R197 Diarrhea, unspecified: Secondary | ICD-10-CM | POA: Diagnosis not present

## 2021-09-26 DIAGNOSIS — K219 Gastro-esophageal reflux disease without esophagitis: Secondary | ICD-10-CM | POA: Diagnosis not present

## 2021-09-28 DIAGNOSIS — F4322 Adjustment disorder with anxiety: Secondary | ICD-10-CM | POA: Diagnosis not present

## 2021-09-28 DIAGNOSIS — S8264XD Nondisplaced fracture of lateral malleolus of right fibula, subsequent encounter for closed fracture with routine healing: Secondary | ICD-10-CM | POA: Diagnosis not present

## 2021-09-28 DIAGNOSIS — E039 Hypothyroidism, unspecified: Secondary | ICD-10-CM | POA: Diagnosis not present

## 2021-09-28 DIAGNOSIS — K5903 Drug induced constipation: Secondary | ICD-10-CM | POA: Diagnosis not present

## 2021-09-28 DIAGNOSIS — S42292D Other displaced fracture of upper end of left humerus, subsequent encounter for fracture with routine healing: Secondary | ICD-10-CM | POA: Diagnosis not present

## 2021-09-28 DIAGNOSIS — M810 Age-related osteoporosis without current pathological fracture: Secondary | ICD-10-CM | POA: Diagnosis not present

## 2021-09-28 DIAGNOSIS — S82142D Displaced bicondylar fracture of left tibia, subsequent encounter for closed fracture with routine healing: Secondary | ICD-10-CM | POA: Diagnosis not present

## 2021-09-28 DIAGNOSIS — S82852D Displaced trimalleolar fracture of left lower leg, subsequent encounter for closed fracture with routine healing: Secondary | ICD-10-CM | POA: Diagnosis not present

## 2021-09-28 DIAGNOSIS — M5416 Radiculopathy, lumbar region: Secondary | ICD-10-CM | POA: Diagnosis not present

## 2021-09-28 DIAGNOSIS — D62 Acute posthemorrhagic anemia: Secondary | ICD-10-CM | POA: Diagnosis not present

## 2021-09-28 DIAGNOSIS — I700 Atherosclerosis of aorta: Secondary | ICD-10-CM | POA: Diagnosis not present

## 2021-09-28 DIAGNOSIS — R197 Diarrhea, unspecified: Secondary | ICD-10-CM | POA: Diagnosis not present

## 2021-09-28 DIAGNOSIS — R195 Other fecal abnormalities: Secondary | ICD-10-CM | POA: Diagnosis not present

## 2021-09-28 DIAGNOSIS — K219 Gastro-esophageal reflux disease without esophagitis: Secondary | ICD-10-CM | POA: Diagnosis not present

## 2021-09-28 DIAGNOSIS — J4521 Mild intermittent asthma with (acute) exacerbation: Secondary | ICD-10-CM | POA: Diagnosis not present

## 2021-09-28 DIAGNOSIS — K5641 Fecal impaction: Secondary | ICD-10-CM | POA: Diagnosis not present

## 2021-09-28 DIAGNOSIS — E785 Hyperlipidemia, unspecified: Secondary | ICD-10-CM | POA: Diagnosis not present

## 2021-09-28 DIAGNOSIS — I10 Essential (primary) hypertension: Secondary | ICD-10-CM | POA: Diagnosis not present

## 2021-09-28 DIAGNOSIS — S92354D Nondisplaced fracture of fifth metatarsal bone, right foot, subsequent encounter for fracture with routine healing: Secondary | ICD-10-CM | POA: Diagnosis not present

## 2021-10-01 DIAGNOSIS — S42292D Other displaced fracture of upper end of left humerus, subsequent encounter for fracture with routine healing: Secondary | ICD-10-CM | POA: Diagnosis not present

## 2021-10-01 DIAGNOSIS — S82852D Displaced trimalleolar fracture of left lower leg, subsequent encounter for closed fracture with routine healing: Secondary | ICD-10-CM | POA: Diagnosis not present

## 2021-10-01 DIAGNOSIS — K5903 Drug induced constipation: Secondary | ICD-10-CM | POA: Diagnosis not present

## 2021-10-01 DIAGNOSIS — E039 Hypothyroidism, unspecified: Secondary | ICD-10-CM | POA: Diagnosis not present

## 2021-10-01 DIAGNOSIS — D62 Acute posthemorrhagic anemia: Secondary | ICD-10-CM | POA: Diagnosis not present

## 2021-10-01 DIAGNOSIS — E785 Hyperlipidemia, unspecified: Secondary | ICD-10-CM | POA: Diagnosis not present

## 2021-10-01 DIAGNOSIS — S82142D Displaced bicondylar fracture of left tibia, subsequent encounter for closed fracture with routine healing: Secondary | ICD-10-CM | POA: Diagnosis not present

## 2021-10-01 DIAGNOSIS — M810 Age-related osteoporosis without current pathological fracture: Secondary | ICD-10-CM | POA: Diagnosis not present

## 2021-10-01 DIAGNOSIS — K219 Gastro-esophageal reflux disease without esophagitis: Secondary | ICD-10-CM | POA: Diagnosis not present

## 2021-10-01 DIAGNOSIS — I10 Essential (primary) hypertension: Secondary | ICD-10-CM | POA: Diagnosis not present

## 2021-10-01 DIAGNOSIS — F4322 Adjustment disorder with anxiety: Secondary | ICD-10-CM | POA: Diagnosis not present

## 2021-10-01 DIAGNOSIS — M5416 Radiculopathy, lumbar region: Secondary | ICD-10-CM | POA: Diagnosis not present

## 2021-10-01 DIAGNOSIS — S8264XD Nondisplaced fracture of lateral malleolus of right fibula, subsequent encounter for closed fracture with routine healing: Secondary | ICD-10-CM | POA: Diagnosis not present

## 2021-10-01 DIAGNOSIS — I700 Atherosclerosis of aorta: Secondary | ICD-10-CM | POA: Diagnosis not present

## 2021-10-01 DIAGNOSIS — J4521 Mild intermittent asthma with (acute) exacerbation: Secondary | ICD-10-CM | POA: Diagnosis not present

## 2021-10-01 DIAGNOSIS — S92354D Nondisplaced fracture of fifth metatarsal bone, right foot, subsequent encounter for fracture with routine healing: Secondary | ICD-10-CM | POA: Diagnosis not present

## 2021-10-03 DIAGNOSIS — S93432D Sprain of tibiofibular ligament of left ankle, subsequent encounter: Secondary | ICD-10-CM | POA: Diagnosis not present

## 2021-10-03 DIAGNOSIS — S42309A Unspecified fracture of shaft of humerus, unspecified arm, initial encounter for closed fracture: Secondary | ICD-10-CM | POA: Diagnosis not present

## 2021-10-03 DIAGNOSIS — S82852D Displaced trimalleolar fracture of left lower leg, subsequent encounter for closed fracture with routine healing: Secondary | ICD-10-CM | POA: Diagnosis not present

## 2021-10-03 DIAGNOSIS — S82132D Displaced fracture of medial condyle of left tibia, subsequent encounter for closed fracture with routine healing: Secondary | ICD-10-CM | POA: Diagnosis not present

## 2021-10-03 DIAGNOSIS — S82142A Displaced bicondylar fracture of left tibia, initial encounter for closed fracture: Secondary | ICD-10-CM | POA: Diagnosis not present

## 2021-10-03 DIAGNOSIS — M810 Age-related osteoporosis without current pathological fracture: Secondary | ICD-10-CM | POA: Diagnosis not present

## 2021-10-03 DIAGNOSIS — T07XXXA Unspecified multiple injuries, initial encounter: Secondary | ICD-10-CM | POA: Diagnosis not present

## 2021-10-03 DIAGNOSIS — S92351D Displaced fracture of fifth metatarsal bone, right foot, subsequent encounter for fracture with routine healing: Secondary | ICD-10-CM | POA: Diagnosis not present

## 2021-10-04 DIAGNOSIS — F4322 Adjustment disorder with anxiety: Secondary | ICD-10-CM | POA: Diagnosis not present

## 2021-10-04 DIAGNOSIS — S42292D Other displaced fracture of upper end of left humerus, subsequent encounter for fracture with routine healing: Secondary | ICD-10-CM | POA: Diagnosis not present

## 2021-10-04 DIAGNOSIS — D62 Acute posthemorrhagic anemia: Secondary | ICD-10-CM | POA: Diagnosis not present

## 2021-10-04 DIAGNOSIS — I700 Atherosclerosis of aorta: Secondary | ICD-10-CM | POA: Diagnosis not present

## 2021-10-04 DIAGNOSIS — E785 Hyperlipidemia, unspecified: Secondary | ICD-10-CM | POA: Diagnosis not present

## 2021-10-04 DIAGNOSIS — M5416 Radiculopathy, lumbar region: Secondary | ICD-10-CM | POA: Diagnosis not present

## 2021-10-04 DIAGNOSIS — E039 Hypothyroidism, unspecified: Secondary | ICD-10-CM | POA: Diagnosis not present

## 2021-10-04 DIAGNOSIS — K5903 Drug induced constipation: Secondary | ICD-10-CM | POA: Diagnosis not present

## 2021-10-04 DIAGNOSIS — I10 Essential (primary) hypertension: Secondary | ICD-10-CM | POA: Diagnosis not present

## 2021-10-04 DIAGNOSIS — S82142D Displaced bicondylar fracture of left tibia, subsequent encounter for closed fracture with routine healing: Secondary | ICD-10-CM | POA: Diagnosis not present

## 2021-10-04 DIAGNOSIS — M810 Age-related osteoporosis without current pathological fracture: Secondary | ICD-10-CM | POA: Diagnosis not present

## 2021-10-04 DIAGNOSIS — S82852D Displaced trimalleolar fracture of left lower leg, subsequent encounter for closed fracture with routine healing: Secondary | ICD-10-CM | POA: Diagnosis not present

## 2021-10-04 DIAGNOSIS — M8000XA Age-related osteoporosis with current pathological fracture, unspecified site, initial encounter for fracture: Secondary | ICD-10-CM | POA: Diagnosis not present

## 2021-10-04 DIAGNOSIS — K219 Gastro-esophageal reflux disease without esophagitis: Secondary | ICD-10-CM | POA: Diagnosis not present

## 2021-10-04 DIAGNOSIS — S8264XD Nondisplaced fracture of lateral malleolus of right fibula, subsequent encounter for closed fracture with routine healing: Secondary | ICD-10-CM | POA: Diagnosis not present

## 2021-10-04 DIAGNOSIS — S92354D Nondisplaced fracture of fifth metatarsal bone, right foot, subsequent encounter for fracture with routine healing: Secondary | ICD-10-CM | POA: Diagnosis not present

## 2021-10-04 DIAGNOSIS — J4521 Mild intermittent asthma with (acute) exacerbation: Secondary | ICD-10-CM | POA: Diagnosis not present

## 2021-10-05 DIAGNOSIS — E039 Hypothyroidism, unspecified: Secondary | ICD-10-CM | POA: Diagnosis not present

## 2021-10-05 DIAGNOSIS — F4322 Adjustment disorder with anxiety: Secondary | ICD-10-CM | POA: Diagnosis not present

## 2021-10-05 DIAGNOSIS — I10 Essential (primary) hypertension: Secondary | ICD-10-CM | POA: Diagnosis not present

## 2021-10-05 DIAGNOSIS — S92354D Nondisplaced fracture of fifth metatarsal bone, right foot, subsequent encounter for fracture with routine healing: Secondary | ICD-10-CM | POA: Diagnosis not present

## 2021-10-05 DIAGNOSIS — S8264XD Nondisplaced fracture of lateral malleolus of right fibula, subsequent encounter for closed fracture with routine healing: Secondary | ICD-10-CM | POA: Diagnosis not present

## 2021-10-05 DIAGNOSIS — D62 Acute posthemorrhagic anemia: Secondary | ICD-10-CM | POA: Diagnosis not present

## 2021-10-05 DIAGNOSIS — Z993 Dependence on wheelchair: Secondary | ICD-10-CM | POA: Diagnosis not present

## 2021-10-05 DIAGNOSIS — S82852D Displaced trimalleolar fracture of left lower leg, subsequent encounter for closed fracture with routine healing: Secondary | ICD-10-CM | POA: Diagnosis not present

## 2021-10-05 DIAGNOSIS — M5416 Radiculopathy, lumbar region: Secondary | ICD-10-CM | POA: Diagnosis not present

## 2021-10-05 DIAGNOSIS — Z9181 History of falling: Secondary | ICD-10-CM | POA: Diagnosis not present

## 2021-10-05 DIAGNOSIS — M810 Age-related osteoporosis without current pathological fracture: Secondary | ICD-10-CM | POA: Diagnosis not present

## 2021-10-05 DIAGNOSIS — K219 Gastro-esophageal reflux disease without esophagitis: Secondary | ICD-10-CM | POA: Diagnosis not present

## 2021-10-05 DIAGNOSIS — I700 Atherosclerosis of aorta: Secondary | ICD-10-CM | POA: Diagnosis not present

## 2021-10-05 DIAGNOSIS — S82142D Displaced bicondylar fracture of left tibia, subsequent encounter for closed fracture with routine healing: Secondary | ICD-10-CM | POA: Diagnosis not present

## 2021-10-05 DIAGNOSIS — S42292D Other displaced fracture of upper end of left humerus, subsequent encounter for fracture with routine healing: Secondary | ICD-10-CM | POA: Diagnosis not present

## 2021-10-05 DIAGNOSIS — J4521 Mild intermittent asthma with (acute) exacerbation: Secondary | ICD-10-CM | POA: Diagnosis not present

## 2021-10-05 DIAGNOSIS — K5903 Drug induced constipation: Secondary | ICD-10-CM | POA: Diagnosis not present

## 2021-10-05 DIAGNOSIS — E785 Hyperlipidemia, unspecified: Secondary | ICD-10-CM | POA: Diagnosis not present

## 2021-10-08 DIAGNOSIS — K5903 Drug induced constipation: Secondary | ICD-10-CM | POA: Diagnosis not present

## 2021-10-08 DIAGNOSIS — K219 Gastro-esophageal reflux disease without esophagitis: Secondary | ICD-10-CM | POA: Diagnosis not present

## 2021-10-08 DIAGNOSIS — S92354D Nondisplaced fracture of fifth metatarsal bone, right foot, subsequent encounter for fracture with routine healing: Secondary | ICD-10-CM | POA: Diagnosis not present

## 2021-10-08 DIAGNOSIS — M5416 Radiculopathy, lumbar region: Secondary | ICD-10-CM | POA: Diagnosis not present

## 2021-10-08 DIAGNOSIS — F4322 Adjustment disorder with anxiety: Secondary | ICD-10-CM | POA: Diagnosis not present

## 2021-10-08 DIAGNOSIS — E039 Hypothyroidism, unspecified: Secondary | ICD-10-CM | POA: Diagnosis not present

## 2021-10-08 DIAGNOSIS — E785 Hyperlipidemia, unspecified: Secondary | ICD-10-CM | POA: Diagnosis not present

## 2021-10-08 DIAGNOSIS — I10 Essential (primary) hypertension: Secondary | ICD-10-CM | POA: Diagnosis not present

## 2021-10-08 DIAGNOSIS — S82142D Displaced bicondylar fracture of left tibia, subsequent encounter for closed fracture with routine healing: Secondary | ICD-10-CM | POA: Diagnosis not present

## 2021-10-08 DIAGNOSIS — S42292D Other displaced fracture of upper end of left humerus, subsequent encounter for fracture with routine healing: Secondary | ICD-10-CM | POA: Diagnosis not present

## 2021-10-08 DIAGNOSIS — I700 Atherosclerosis of aorta: Secondary | ICD-10-CM | POA: Diagnosis not present

## 2021-10-08 DIAGNOSIS — S82852D Displaced trimalleolar fracture of left lower leg, subsequent encounter for closed fracture with routine healing: Secondary | ICD-10-CM | POA: Diagnosis not present

## 2021-10-08 DIAGNOSIS — S8264XD Nondisplaced fracture of lateral malleolus of right fibula, subsequent encounter for closed fracture with routine healing: Secondary | ICD-10-CM | POA: Diagnosis not present

## 2021-10-08 DIAGNOSIS — J4521 Mild intermittent asthma with (acute) exacerbation: Secondary | ICD-10-CM | POA: Diagnosis not present

## 2021-10-08 DIAGNOSIS — M810 Age-related osteoporosis without current pathological fracture: Secondary | ICD-10-CM | POA: Diagnosis not present

## 2021-10-08 DIAGNOSIS — D62 Acute posthemorrhagic anemia: Secondary | ICD-10-CM | POA: Diagnosis not present

## 2021-10-10 ENCOUNTER — Ambulatory Visit: Payer: BC Managed Care – PPO | Admitting: Physical Therapy

## 2021-10-10 ENCOUNTER — Encounter: Payer: Self-pay | Admitting: Rehabilitative and Restorative Service Providers"

## 2021-10-10 ENCOUNTER — Other Ambulatory Visit: Payer: Self-pay

## 2021-10-10 ENCOUNTER — Ambulatory Visit
Payer: BC Managed Care – PPO | Attending: Orthopedic Surgery | Admitting: Rehabilitative and Restorative Service Providers"

## 2021-10-10 DIAGNOSIS — X58XXXD Exposure to other specified factors, subsequent encounter: Secondary | ICD-10-CM | POA: Diagnosis not present

## 2021-10-10 DIAGNOSIS — M6281 Muscle weakness (generalized): Secondary | ICD-10-CM | POA: Diagnosis not present

## 2021-10-10 DIAGNOSIS — M25572 Pain in left ankle and joints of left foot: Secondary | ICD-10-CM | POA: Diagnosis not present

## 2021-10-10 DIAGNOSIS — S82891D Other fracture of right lower leg, subsequent encounter for closed fracture with routine healing: Secondary | ICD-10-CM | POA: Diagnosis not present

## 2021-10-10 DIAGNOSIS — G2589 Other specified extrapyramidal and movement disorders: Secondary | ICD-10-CM | POA: Diagnosis not present

## 2021-10-10 DIAGNOSIS — S82892D Other fracture of left lower leg, subsequent encounter for closed fracture with routine healing: Secondary | ICD-10-CM | POA: Diagnosis not present

## 2021-10-10 DIAGNOSIS — M25512 Pain in left shoulder: Secondary | ICD-10-CM | POA: Insufficient documentation

## 2021-10-10 DIAGNOSIS — M25571 Pain in right ankle and joints of right foot: Secondary | ICD-10-CM | POA: Insufficient documentation

## 2021-10-10 DIAGNOSIS — R2689 Other abnormalities of gait and mobility: Secondary | ICD-10-CM | POA: Insufficient documentation

## 2021-10-10 DIAGNOSIS — Z96619 Presence of unspecified artificial shoulder joint: Secondary | ICD-10-CM | POA: Diagnosis not present

## 2021-10-10 DIAGNOSIS — I10 Essential (primary) hypertension: Secondary | ICD-10-CM | POA: Insufficient documentation

## 2021-10-10 DIAGNOSIS — S42202D Unspecified fracture of upper end of left humerus, subsequent encounter for fracture with routine healing: Secondary | ICD-10-CM | POA: Diagnosis not present

## 2021-10-10 DIAGNOSIS — M810 Age-related osteoporosis without current pathological fracture: Secondary | ICD-10-CM | POA: Diagnosis not present

## 2021-10-10 NOTE — Therapy (Addendum)
[facility] [place], [place], [address] Phone: [phone]   Fax:  [phone]  Physical Therapy Evaluation  Patient Details  Name: Kathryn Boyer MRN: [id number] Date of Birth: 05-Feb-1956 Referring Provider (PT): Dr Altamese [place]; Dr Da Griffin Basil   Encounter Date: 10/10/2021   PT End of Session - 10/10/21 1100     Visit Number 1    Number of Visits 30    Date for PT Re-Evaluation 01/02/22    PT Start Time 0945    PT Stop Time 1054    PT Time Calculation (min) 69 min    Activity Tolerance Patient tolerated treatment well             Past Medical History:  Diagnosis Date   Anxiety    Asthma    Hyperlipidemia    Hypertension    Plantar fasciitis    Thyroid disease     Past Surgical History:  Procedure Laterality Date   ABDOMINAL HYSTERECTOMY     BREAST SURGERY     NOSE SURGERY     ORIF ANKLE FRACTURE Left 07/23/2021   Procedure: OPEN REDUCTION INTERNAL FIXATION (ORIF) ANKLE FRACTURE;  Surgeon: Altamese [place], MD;  Location: [facility];  Service: Orthopedics;  Laterality: Left;   REVERSE SHOULDER ARTHROPLASTY Left 07/25/2021   Procedure: LEFT REVERSE SHOULDER ARTHROPLASTY;  Surgeon: Hiram Gash, MD;  Location: [facility];  Service: Orthopedics;  Laterality: Left;    There were no vitals filed for this visit.    Subjective Assessment - 10/10/21 0947     Subjective Patient reports that she fell 07/20/21 sustaining fx Lt tibial plateau; %th metatarsal; RT ankle, Lt proximal humerus    Pertinent History ORIF Lt ankle 07/23/21; reverse TSA 07/25/21    Patient Stated Goals get shoulder working again; fix her on hair; be able to walk well    Currently in Pain? Yes    Pain Score 2     Pain Location Ankle    Pain Orientation Left    Pain Descriptors / Indicators Throbbing;Shooting    Pain Type Acute pain    Pain Radiating Towards up into the Lt leg into posterior thigh    Pain Onset More than a month ago     Pain Frequency Intermittent    Aggravating Factors  standing and walking for prolonged times    Pain Relieving Factors advil; elevate LE    Pain Score 3    Pain Location Shoulder    Pain Orientation Left    Pain Descriptors / Indicators Dull    Pain Type Acute pain    Pain Radiating Towards mid back and shoulder blade area    Pain Onset More than a month ago    Pain Frequency Intermittent    Aggravating Factors  weight bearing with walker; elevation; certain movements    Pain Relieving Factors advil; pain patches                [facility] PT Assessment - 10/10/21 0001       Assessment   Medical Diagnosis Lt reverse TSA; Lt/Rt ankle fractures    Referring Provider (PT) Dr Altamese [place]; Dr Da Griffin Basil    Onset Date/Surgical Date 07/20/21    Hand Dominance Right    Next MD Visit 10/23/21; 11/14/21    Prior Therapy in hospital; rehab; home health      Precautions   Precautions None      Restrictions   Weight Bearing Restrictions  No      Balance Screen   Has the patient fallen in the past 6 months Yes    How many times? 1    Has the patient had a decrease in activity level because of a fear of falling?  No    Is the patient reluctant to leave their home because of a fear of falling?  No      Home Ecologist residence    Living Arrangements Spouse/significant other    Home Access Stairs to enter    Entrance Stairs-Number of Steps 7    [place] Two level      Prior Function   Level of Independence Independent    Vocation Retired    Insurance underwriter    Leisure household chores; cooking      Observation/Other Assessments   Focus on Therapeutic Outcomes (FOTO)  32      Sensation   Additional Comments WFL's      Posture/Postural Control   Posture Comments head forward; shoulders rounded and elevated; head of the humerus anterior in orientation; wt shifted to the Rt      AROM   Overall AROM Comments poor  movement pattern with all Lt shoulder movements - scapular dyskinesis    Right Shoulder Extension 62 Degrees    Right Shoulder Flexion 163 Degrees    Right Shoulder ABduction 155 Degrees    Right Shoulder Internal Rotation --   thumb to T9   Right Shoulder External Rotation 90 Degrees   shoulder 90/elbow 90   Left Shoulder Extension 29 Degrees    Left Shoulder Flexion 72 Degrees    Left Shoulder ABduction 72 Degrees    Left Shoulder Internal Rotation --   limited per protocol   Left Shoulder External Rotation 8 Degrees   shoulder in neutral at side   Right Ankle Dorsiflexion 14    Right Ankle Plantar Flexion 53    Right Ankle Inversion 35    Right Ankle Eversion 18    Left Ankle Dorsiflexion -5    Left Ankle Plantar Flexion 13    Left Ankle Inversion 4    Left Ankle Eversion 3      Strength   Overall Strength Comments strength not tested resistively Lt shoulder    Right Shoulder Flexion 4+/5    Right Shoulder Extension 4+/5    Right Shoulder ABduction 4+/5    Right Shoulder Internal Rotation 4+/5    Right Shoulder External Rotation 4+/5    Right Ankle Dorsiflexion 4/5    Right Ankle Plantar Flexion 3/5    Right Ankle Inversion 4+/5    Right Ankle Eversion 4/5    Left Ankle Dorsiflexion 3+/5    Left Ankle Plantar Flexion 3-/5    Left Ankle Inversion 2+/5    Left Ankle Eversion 2+/5      Flexibility   Hamstrings tight Lt > Rt    Quadriceps tight bilat    ITB tight Lt > Rt    Piriformis tight Lt > Rt      Palpation   Palpation comment muscular tightness through the Lt pecs, anterior deltoid, biceps, upper trap, leveator, teres; Lt hamstring,calf      Transfers   Comments independent with walker      Ambulation/Gait   Ambulation/Gait Yes    Ambulation/Gait Assistance 7: Independent    Ambulation Distance (Feet) 40 Feet    Assistive device Rolling walker    Ambulation Surface Level  Gait Comments cam boot Lt                        Objective  measurements completed on examination: See above findings.       [facility] Adult PT Treatment/Exercise - 10/10/21 0001       Neuro Re-ed    Neuro Re-ed Details  postural correction encouraging posterior shoulder girdle activation      Knee/Hip Exercises: Stretches   Passive Hamstring Stretch Left;2 reps;30 seconds    Passive Hamstring Stretch Limitations supine with strap    ITB Stretch Left;2 reps;30 seconds    ITB Stretch Limitations supine with strap    Piriformis Stretch Left;2 reps;30 seconds    Piriformis Stretch Limitations supine travell      Shoulder Exercises: Supine   External Rotation AAROM;Left;5 reps    External Rotation Limitations cane to assist with AAROM keeping elbow at 90 deg flexion elbow at side    Other Supine Exercises scap squeeze 10 sec x 5 reps      Shoulder Exercises: Pulleys   Flexion Limitations 10 sec hold x 10 VC/TC to keep Lt shoulder girdle down and back    Scaption Limitations 10 sec x 10 reps VC/TC to keep Lt shoulder girdle down and back      Ankle Exercises: Stretches   Other Stretch ankle DF sitting 10 sec x 5 reps sliding heel back to stretch                     PT Education - 10/10/21 1044     Education Details POC HEP    Person(s) Educated Patient    Methods Explanation;Demonstration;Tactile cues;Verbal cues;Handout    Comprehension Verbalized understanding;Returned demonstration;Verbal cues required;Tactile cues required              PT Short Term Goals - 10/10/21 1300       PT SHORT TERM GOAL #1   Title Improve posture and alignment with patient to deonstrate improved upright posture with posterior shoudler girdle engaged therefore improving shoulder mobilty and function    Time 6    Period Weeks    Status New    Target Date 11/21/21      PT SHORT TERM GOAL #2   Title Increase AROM Lt shoulder to 100-110 degress flexion and scaption    Time 6    Period Weeks    Status New    Target Date 11/21/21      PT  SHORT TERM GOAL #3   Title Increase AROM Lt ankle to 5 deg ankle DF and 30 deg PF    Time 6    Period Weeks    Status New    Target Date 11/21/21      PT SHORT TERM GOAL #4   Title Patient to demonstrate improved gait pattern with least assistive device for household distances of 100-120 ft    Time 6    Period Weeks    Status New    Target Date 11/21/21      PT SHORT TERM GOAL #5   Title Patient to negotiate 4 steps to enter and exit home with assistive device as needed (dies not have railing either side of steps)    Time 6    Period Weeks    Status New    Target Date 11/21/21               PT Long Term Goals -  10/10/21 1304       PT LONG TERM GOAL #1   Title Functional AROM Lt shoulder allowing patient to return to normal functional activities    Time 12    Period Weeks    Status New    Target Date 01/02/22      PT LONG TERM GOAL #2   Title 4+/5 to 5/5 strength Lt shoulder and bilat ankles    Time 12    Period Weeks    Status New    Target Date 01/02/22      PT LONG TERM GOAL #3   Title Ambulation community distances of several hundred yards without assistive device    Time 12    Period Weeks    Status New    Target Date 01/02/22      PT LONG TERM GOAL #4   Title Independent in HEP(including aquatic program as indicated)    Time 12    Period Weeks    Status New    Target Date 01/02/22      PT LONG TERM GOAL #5   Title Improve functional limitation score to 58    Time 12    Period Weeks    Status New    Target Date 01/02/22                    Plan - 10/10/21 1101     Clinical Impression Statement Patient presents s/p Bilat ankle fractures and Lt proximal humeral fracture 07/20/21 s/p ORIF Lt ankle and foot 07/23/21 and Lt reverse TSA 07/25/21. She has limited ambulatory status with gait with rolling walker; decreased A/P ROM Lt > Rt ankle and Lt shoulder; decreased tissue extensibility bilat LE's; weakness bilat LE's; Lt UE; postural  musculature; dependent functional activity level. Patient will benefit from PT to address problems identified.    Personal Factors and Comorbidities Age;Fitness;Past/Current Experience;Comorbidity 1;Comorbidity 2    Comorbidities hypertension; osteoporesis    Examination-Activity Limitations Locomotion Level;Transfers;Reach Overhead;Bend;Sit;Carry;Sleep;Squat;Dressing;Stairs;Stand;Lift;Toileting    Examination-Participation Restrictions Church;Cleaning;Community Activity;Driving;Laundry;Occupation;Meal Prep;Shop;Volunteer    Stability/Clinical Decision Making Evolving/Moderate complexity    Clinical Decision Making Moderate    Rehab Potential Good    PT Frequency 3x / week    PT Duration 12 weeks    PT Treatment/Interventions ADLs/Self Care Home Management;Aquatic Therapy;Cryotherapy;Electrical Stimulation;Iontophoresis 4mg /ml Dexamethasone;Moist Heat;Ultrasound;Functional mobility training;Gait training;Stair training;Therapeutic activities;Therapeutic exercise;Balance training;Neuromuscular re-education;Patient/family education;Manual techniques;Passive range of motion;Dry needling;Taping    PT Next Visit Plan revies HEP; progress with shoulder and bilat anle rehab - deep tissue work vs DN for shoulder girdle(not discussed), PROM and stretching Rt shoulder; ROM, stretching, strengthening, gait training, stairs, progressing rehab as tolerated and per protocol; modalities as indicated    PT Home Exercise Plan [id number]    Consulted and Agree with Plan of Care Patient             Patient will benefit from skilled therapeutic intervention in order to improve the following deficits and impairments:  Abnormal gait, Decreased range of motion, Increased fascial restricitons, Decreased activity tolerance, Pain, Decreased balance, Impaired flexibility, Improper body mechanics, Decreased mobility, Decreased strength, Postural dysfunction  Visit Diagnosis: Acute pain of left shoulder  Scapular  dyskinesis  Muscle weakness (generalized)  Pain in left ankle and joints of left foot  Pain in right ankle and joints of right foot  Other abnormalities of gait and mobility     Problem List Patient Active Problem List   Diagnosis Date Noted   Pressure injury of skin of  sacral region 08/10/2021   Acute cystitis without hematuria 08/10/2021   Acute blood loss anemia    History of hypertension    Drug induced constipation    Postoperative pain    Critical polytrauma 07/27/2021   Humerus fracture 07/21/2021   Tibial plateau fracture, left 07/21/2021   Nondisplaced fracture of fifth metatarsal bone, right foot, initial encounter for closed fracture 07/21/2021   Nondisplaced fracture of lateral malleolus of right fibula, initial encounter for closed fracture 07/21/2021   Left trimalleolar fracture, closed, initial encounter 07/21/2021   Fall 07/20/2021   Urine frequency 04/29/2019   Chest pain 04/29/2019   Urinary frequency 04/29/2019   Post-menopausal 04/29/2019   Allergic rhinitis 10/13/2018   Mild intermittent asthma with acute exacerbation 10/13/2018   Gastroesophageal reflux disease 10/13/2018   Right hip pain 06/01/2018   Chronic low back pain 05/25/2018   Acute pain of right knee 05/25/2018   Thoracic aorta atherosclerosis ([place]) 05/25/2018   Generalized anxiety disorder 03/22/2017   Hyperlipidemia LDL goal <100 03/22/2017   Hypothyroidism 03/22/2017   Osteoporosis 12/20/2016   Encounter for counseling 09/18/2016   Morbid obesity ([place]) 02/06/2016   Vaginal dryness 02/06/2016   Allergic rhinitis due to pollen 02/01/2016   Acute maxillary sinusitis 02/01/2016   Essential hypertension 02/01/2016   Asthma with acute exacerbation 02/01/2016   Lumbar radiculopathy, acute 11/01/2015   Osteoporosis, 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

## 2021-10-10 NOTE — Patient Instructions (Signed)
Access Code: [email or web address] URL: [email or web address] Date: 10/10/2021 Prepared by: Corlis Leak  Exercises Hooklying Hamstring Stretch with Strap - 2 x daily - 7 x weekly - 1 sets - 3 reps - 30 sec hold Supine ITB Stretch with Strap - 2 x daily - 7 x weekly - 1 sets - 3 reps - 30 sec hold Supine Piriformis Stretch with Leg Straight - 2 x daily - 7 x weekly - 1 sets - 3 reps - 30 sec hold Supine Scapular Retraction - 2 x daily - 7 x weekly - 1 sets - 10 reps - 10 sec hold Supine Shoulder External Internal Rotation AAROM with Dowel - 2 x daily - 7 x weekly - 1 sets - 5-10 reps - 10-20 sec hold Seated Ankle Dorsiflexion Stretch - 2 x daily - 7 x weekly - 1 sets - 5-10 reps - 10 sec hold Seated Shoulder Flexion AAROM with Pulley Behind - 2 x daily - 7 x weekly - 1 sets - 10 reps - 10 sec hold Seated Shoulder Scaption AAROM with Pulley at Side - 2 x daily - 7 x weekly - 1 sets - 10 reps - 10sec hold

## 2021-10-10 NOTE — Addendum Note (Signed)
Addended by: Val Riles on: 10/10/2021 01:16 PM   Modules accepted: Orders

## 2021-10-11 DIAGNOSIS — R194 Change in bowel habit: Secondary | ICD-10-CM | POA: Diagnosis not present

## 2021-10-11 DIAGNOSIS — R197 Diarrhea, unspecified: Secondary | ICD-10-CM | POA: Diagnosis not present

## 2021-10-15 ENCOUNTER — Other Ambulatory Visit: Payer: Self-pay

## 2021-10-15 ENCOUNTER — Ambulatory Visit: Payer: BC Managed Care – PPO | Admitting: Rehabilitative and Restorative Service Providers"

## 2021-10-15 ENCOUNTER — Encounter: Payer: Self-pay | Admitting: Rehabilitative and Restorative Service Providers"

## 2021-10-15 DIAGNOSIS — S42202D Unspecified fracture of upper end of left humerus, subsequent encounter for fracture with routine healing: Secondary | ICD-10-CM | POA: Diagnosis not present

## 2021-10-15 DIAGNOSIS — M6281 Muscle weakness (generalized): Secondary | ICD-10-CM

## 2021-10-15 DIAGNOSIS — M25572 Pain in left ankle and joints of left foot: Secondary | ICD-10-CM

## 2021-10-15 DIAGNOSIS — R2689 Other abnormalities of gait and mobility: Secondary | ICD-10-CM | POA: Diagnosis not present

## 2021-10-15 DIAGNOSIS — G2589 Other specified extrapyramidal and movement disorders: Secondary | ICD-10-CM | POA: Diagnosis not present

## 2021-10-15 DIAGNOSIS — M810 Age-related osteoporosis without current pathological fracture: Secondary | ICD-10-CM | POA: Diagnosis not present

## 2021-10-15 DIAGNOSIS — I10 Essential (primary) hypertension: Secondary | ICD-10-CM | POA: Diagnosis not present

## 2021-10-15 DIAGNOSIS — X58XXXD Exposure to other specified factors, subsequent encounter: Secondary | ICD-10-CM | POA: Diagnosis not present

## 2021-10-15 DIAGNOSIS — M25512 Pain in left shoulder: Secondary | ICD-10-CM | POA: Diagnosis not present

## 2021-10-15 DIAGNOSIS — M25571 Pain in right ankle and joints of right foot: Secondary | ICD-10-CM | POA: Diagnosis not present

## 2021-10-15 DIAGNOSIS — S82892D Other fracture of left lower leg, subsequent encounter for closed fracture with routine healing: Secondary | ICD-10-CM | POA: Diagnosis not present

## 2021-10-15 DIAGNOSIS — Z96619 Presence of unspecified artificial shoulder joint: Secondary | ICD-10-CM | POA: Diagnosis not present

## 2021-10-15 DIAGNOSIS — S82891D Other fracture of right lower leg, subsequent encounter for closed fracture with routine healing: Secondary | ICD-10-CM | POA: Diagnosis not present

## 2021-10-15 NOTE — Patient Instructions (Addendum)
Standing with equal weight both legs - shift weight side to side 30-60 sec  Standing with one foot forward; one back - shift weight forward and back slowly 30-60 sec Repeat 3-5 times   Access Code: [email or web address] URL: [email or web address] Date: 10/15/2021 Prepared by: Corlis Leak  Exercises Hooklying Hamstring Stretch with Strap - 2 x daily - 7 x weekly - 1 sets - 3 reps - 30 sec hold Supine ITB Stretch with Strap - 2 x daily - 7 x weekly - 1 sets - 3 reps - 30 sec hold Supine Piriformis Stretch with Leg Straight - 2 x daily - 7 x weekly - 1 sets - 3 reps - 30 sec hold Supine Scapular Retraction - 2 x daily - 7 x weekly - 1 sets - 10 reps - 10 sec hold Supine Shoulder External Internal Rotation AAROM with Dowel - 2 x daily - 7 x weekly - 1 sets - 5-10 reps - 10-20 sec hold Seated Ankle Dorsiflexion Stretch - 2 x daily - 7 x weekly - 1 sets - 5-10 reps - 10 sec hold Seated Shoulder Flexion AAROM with Pulley Behind - 2 x daily - 7 x weekly - 1 sets - 10 reps - 10 sec hold Seated Shoulder Scaption AAROM with Pulley at Side - 2 x daily - 7 x weekly - 1 sets - 10 reps - 10sec hold Supine Shoulder Flexion AAROM - 2 x daily - 7 x weekly - 1 sets - 5 reps - 5 sec hold Supine Chest Stretch on Foam Roll - 2 x daily - 7 x weekly - 1 sets - 1 reps - 2-5 min sec hold

## 2021-10-15 NOTE — Therapy (Signed)
[place], [place], [address] Phone: [phone]   Fax:  [phone]  Physical Therapy Treatment  Patient Details  Name: Kathryn Boyer MRN: [id number] Date of Birth: 12-13-1955 Referring Provider (PT): Dr Altamese Jerome; Dr Da Griffin Basil   Encounter Date: 10/15/2021   PT End of Session - 10/15/21 1101     Visit Number 2    Number of Visits 30    Date for PT Re-Evaluation 01/02/22    PT Start Time 1058    PT Stop Time [id number]    PT Time Calculation (min) 47 min    Activity Tolerance Patient tolerated treatment well             Past Medical History:  Diagnosis Date   Anxiety    Asthma    Hyperlipidemia    Hypertension    Plantar fasciitis    Thyroid disease     Past Surgical History:  Procedure Laterality Date   ABDOMINAL HYSTERECTOMY     BREAST SURGERY     NOSE SURGERY     ORIF ANKLE FRACTURE Left 07/23/2021   Procedure: OPEN REDUCTION INTERNAL FIXATION (ORIF) ANKLE FRACTURE;  Surgeon: Altamese Bier, MD;  Location: [facility];  Service: Orthopedics;  Laterality: Left;   REVERSE SHOULDER ARTHROPLASTY Left 07/25/2021   Procedure: LEFT REVERSE SHOULDER ARTHROPLASTY;  Surgeon: Hiram Gash, MD;  Location: [facility];  Service: Orthopedics;  Laterality: Left;    There were no vitals filed for this visit.   Subjective Assessment - 10/15/21 1102     Subjective Increased pain in the Lt shoulder and Lt foot ankle and knee in last night and today. Has done some of the exercises since here last. She was hurting too much to do exercises yesterday.    Currently in Pain? Yes    Pain Score 5     Pain Location Ankle    Pain Orientation Left    Pain Descriptors / Indicators Aching    Pain Type Acute pain    Pain Score 8    Pain Location Shoulder    Pain Orientation Left    Pain Descriptors / Indicators Aching    Pain Type Acute pain                [facility] PT Assessment - 10/15/21 0001        Assessment   Medical Diagnosis Lt reverse TSA; Lt/Rt ankle fractures    Referring Provider (PT) Dr Altamese [place]; Dr Da Griffin Basil    Onset Date/Surgical Date 07/20/21    Hand Dominance Right    Next MD Visit 10/23/21; 11/14/21    Prior Therapy in hospital; rehab; home health                           [facility] Adult PT Treatment/Exercise - 10/15/21 0001       Ambulation/Gait   Ambulation/Gait Yes    Ambulation/Gait Assistance 7: Independent    Ambulation Distance (Feet) 80 Feet    Assistive device Rolling walker;Small based quad cane    Ambulation Surface Level    Stairs Yes    Stairs Assistance 4: Min guard    Stair Management Technique Step to pattern;With cane;Forwards    Number of Stairs 3   3 reps up and down 3 steps   Height of Stairs 4    Pre-Gait Activities standing wt shift side to side; one foot forward/one back for weight  shift forward to back each side    Gait Comments regular tennis shoes      Posture/Postural Control   Posture Comments head forward; shoulders rounded and elevated; head of the humerus anterior in orientation; wt shifted to the Rt      Neuro Re-ed    Neuro Re-ed Details  postural correction encouraging posterior shoulder girdle activation      Knee/Hip Exercises: Stretches   Passive Hamstring Stretch Left;1 rep;30 seconds    Passive Hamstring Stretch Limitations supine with strap    ITB Stretch Left;1 rep;30 seconds    ITB Stretch Limitations supine with strap    Piriformis Stretch Left;1 rep;30 seconds    Piriformis Stretch Limitations supine travell      Shoulder Exercises: Supine   Flexion AAROM;Left;5 reps    Flexion Limitations assist with Rt UE for 10 sec end range stretch    Other Supine Exercises scap squeeze 10 sec x 5 reps x 2 sets      Manual Therapy   Manual therapy comments pt supine LE supported    Soft tissue mobilization deep tissue work to pt tolerance through the Lt shoulder girdle    Myofascial Release anterior  chest    Scapular Mobilization gentle scapular mobs    Passive ROM PROM Lt shoulder flexion; abduction and ER in scapular plane to pt tolerance      Ankle Exercises: Standing   Other Standing Ankle Exercises standing wt shift with cane for support equal stance and repeated with staggered stance x 2-3 min      Ankle Exercises: Seated   Heel Slides Left;5 reps   encouraging heel on floor and stretch through the Lt calf                    PT Education - 10/15/21 1130     Education Details HEP    Person(s) Educated Patient    Methods Explanation;Demonstration;Tactile cues;Verbal cues;Handout    Comprehension Verbalized understanding;Returned demonstration;Verbal cues required;Tactile cues required              PT Short Term Goals - 10/10/21 1300       PT SHORT TERM GOAL #1   Title Improve posture and alignment with patient to deonstrate improved upright posture with posterior shoudler girdle engaged therefore improving shoulder mobilty and function    Time 6    Period Weeks    Status New    Target Date 11/21/21      PT SHORT TERM GOAL #2   Title Increase AROM Lt shoulder to 100-110 degress flexion and scaption    Time 6    Period Weeks    Status New    Target Date 11/21/21      PT SHORT TERM GOAL #3   Title Increase AROM Lt ankle to 5 deg ankle DF and 30 deg PF    Time 6    Period Weeks    Status New    Target Date 11/21/21      PT SHORT TERM GOAL #4   Title Patient to demonstrate improved gait pattern with least assistive device for household distances of 100-120 ft    Time 6    Period Weeks    Status New    Target Date 11/21/21      PT SHORT TERM GOAL #5   Title Patient to negotiate 4 steps to enter and exit home with assistive device as needed (dies not have railing either side of steps)  Time 6    Period Weeks    Status New    Target Date 11/21/21               PT Long Term Goals - 10/10/21 1304       PT LONG TERM GOAL #1   Title  Functional AROM Lt shoulder allowing patient to return to normal functional activities    Time 12    Period Weeks    Status New    Target Date 01/02/22      PT LONG TERM GOAL #2   Title 4+/5 to 5/5 strength Lt shoulder and bilat ankles    Time 12    Period Weeks    Status New    Target Date 01/02/22      PT LONG TERM GOAL #3   Title Ambulation community distances of several hundred yards without assistive device    Time 12    Period Weeks    Status New    Target Date 01/02/22      PT LONG TERM GOAL #4   Title Independent in HEP(including aquatic program as indicated)    Time 12    Period Weeks    Status New    Target Date 01/02/22      PT LONG TERM GOAL #5   Title Improve functional limitation score to 58    Time 12    Period Weeks    Status New    Target Date 01/02/22                   Plan - 10/15/21 1118     Clinical Impression Statement Increased pain in the Lt ankle/foot and Lt shoulder. She is in her regular shoe today and out of the CAM boot which is cleared by MD per pt report. Added soft tissue work and PROM Lt shoulder. Reviewed HEP and progressed with HEP. Trial of gait with 4 point cane level surfaces and up/down 3 steps 4 inches in height x 3 reps with contact guard assist. Patient tolerated gait with 4 point cane ok stating that the foot and ankle hurt less than when she was walking with the walker.    Rehab Potential Good    PT Frequency 3x / week    PT Duration 12 weeks    PT Treatment/Interventions ADLs/Self Care Home Management;Aquatic Therapy;Cryotherapy;Electrical Stimulation;Iontophoresis 4mg /ml Dexamethasone;Moist Heat;Ultrasound;Functional mobility training;Gait training;Stair training;Therapeutic activities;Therapeutic exercise;Balance training;Neuromuscular re-education;Patient/family education;Manual techniques;Passive range of motion;Dry needling;Taping    PT Next Visit Plan revies HEP; progress with shoulder and bilat anle rehab - deep  tissue work vs DN for shoulder girdle(not discussed), PROM and stretching Rt shoulder; ROM, stretching, strengthening, gait training, stairs, progressing rehab as tolerated and per protocol; modalities as indicated    PT Home Exercise Plan [id number]    Consulted and Agree with Plan of Care Patient             Patient will benefit from skilled therapeutic intervention in order to improve the following deficits and impairments:     Visit Diagnosis: Acute pain of left shoulder  Scapular dyskinesis  Muscle weakness (generalized)  Pain in left ankle and joints of left foot  Pain in right ankle and joints of right foot  Other abnormalities of gait and mobility     Problem List Patient Active Problem List   Diagnosis Date Noted   Pressure injury of skin of sacral region 08/10/2021   Acute cystitis without hematuria 08/10/2021   Acute  blood loss anemia    History of hypertension    Drug induced constipation    Postoperative pain    Critical polytrauma 07/27/2021   Humerus fracture 07/21/2021   Tibial plateau fracture, left 07/21/2021   Nondisplaced fracture of fifth metatarsal bone, right foot, initial encounter for closed fracture 07/21/2021   Nondisplaced fracture of lateral malleolus of right fibula, initial encounter for closed fracture 07/21/2021   Left trimalleolar fracture, closed, initial encounter 07/21/2021   Fall 07/20/2021   Urine frequency 04/29/2019   Chest pain 04/29/2019   Urinary frequency 04/29/2019   Post-menopausal 04/29/2019   Allergic rhinitis 10/13/2018   Mild intermittent asthma with acute exacerbation 10/13/2018   Gastroesophageal reflux disease 10/13/2018   Right hip pain 06/01/2018   Chronic low back pain 05/25/2018   Acute pain of right knee 05/25/2018   Thoracic aorta atherosclerosis ([place]) 05/25/2018   Generalized anxiety disorder 03/22/2017   Hyperlipidemia LDL goal <100 03/22/2017   Hypothyroidism 03/22/2017   Osteoporosis 12/20/2016    Encounter for counseling 09/18/2016   Morbid obesity ([place]) 02/06/2016   Vaginal dryness 02/06/2016   Allergic rhinitis due to pollen 02/01/2016   Acute maxillary sinusitis 02/01/2016   Essential hypertension 02/01/2016   Asthma with acute exacerbation 02/01/2016   Lumbar radiculopathy, acute 11/01/2015   Osteoporosis, 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   Adjustment disorder with anxiety 02/08/2015   Abnormal weight gain 02/08/2015    Aylani Spurlock Nilda Simmer, PT, MPH  10/15/2021, 12:57 PM  [facility] [place], [place], [address] Phone: [phone]   Fax:  [phone]  Name: Saiyuri Amarillas MRN: [id number] Date of Birth: [birth year]

## 2021-10-17 ENCOUNTER — Ambulatory Visit: Payer: BC Managed Care – PPO | Admitting: Physical Therapy

## 2021-10-17 ENCOUNTER — Other Ambulatory Visit: Payer: Self-pay

## 2021-10-17 DIAGNOSIS — I10 Essential (primary) hypertension: Secondary | ICD-10-CM | POA: Diagnosis not present

## 2021-10-17 DIAGNOSIS — X58XXXD Exposure to other specified factors, subsequent encounter: Secondary | ICD-10-CM | POA: Diagnosis not present

## 2021-10-17 DIAGNOSIS — M6281 Muscle weakness (generalized): Secondary | ICD-10-CM

## 2021-10-17 DIAGNOSIS — M25571 Pain in right ankle and joints of right foot: Secondary | ICD-10-CM | POA: Diagnosis not present

## 2021-10-17 DIAGNOSIS — G2589 Other specified extrapyramidal and movement disorders: Secondary | ICD-10-CM | POA: Diagnosis not present

## 2021-10-17 DIAGNOSIS — M25512 Pain in left shoulder: Secondary | ICD-10-CM

## 2021-10-17 DIAGNOSIS — R2689 Other abnormalities of gait and mobility: Secondary | ICD-10-CM

## 2021-10-17 DIAGNOSIS — M810 Age-related osteoporosis without current pathological fracture: Secondary | ICD-10-CM | POA: Diagnosis not present

## 2021-10-17 DIAGNOSIS — M25572 Pain in left ankle and joints of left foot: Secondary | ICD-10-CM | POA: Diagnosis not present

## 2021-10-17 DIAGNOSIS — Z96619 Presence of unspecified artificial shoulder joint: Secondary | ICD-10-CM | POA: Diagnosis not present

## 2021-10-17 DIAGNOSIS — S82891D Other fracture of right lower leg, subsequent encounter for closed fracture with routine healing: Secondary | ICD-10-CM | POA: Diagnosis not present

## 2021-10-17 DIAGNOSIS — S82892D Other fracture of left lower leg, subsequent encounter for closed fracture with routine healing: Secondary | ICD-10-CM | POA: Diagnosis not present

## 2021-10-17 DIAGNOSIS — S42202D Unspecified fracture of upper end of left humerus, subsequent encounter for fracture with routine healing: Secondary | ICD-10-CM | POA: Diagnosis not present

## 2021-10-17 NOTE — Therapy (Signed)
[place], [place], [address] Phone: [phone]   Fax:  [phone]  Physical Therapy Treatment  Patient Details  Name: Kathryn Boyer MRN: [id number] Date of Birth: 1956/03/23 Referring Provider (PT): Dr Altamese [place]; Dr Da Griffin Basil   Encounter Date: 10/17/2021   PT End of Session - 10/17/21 1004     Visit Number 3    Number of Visits 30    Date for PT Re-Evaluation 01/02/22    Authorization Type BCBS    PT Start Time 1010    PT Stop Time 1100    PT Time Calculation (min) 50 min    Activity Tolerance Patient tolerated treatment well    Behavior During Therapy [facility] for tasks assessed/performed             Past Medical History:  Diagnosis Date   Anxiety    Asthma    Hyperlipidemia    Hypertension    Plantar fasciitis    Thyroid disease     Past Surgical History:  Procedure Laterality Date   ABDOMINAL HYSTERECTOMY     BREAST SURGERY     NOSE SURGERY     ORIF ANKLE FRACTURE Left 07/23/2021   Procedure: OPEN REDUCTION INTERNAL FIXATION (ORIF) ANKLE FRACTURE;  Surgeon: Altamese Buhl, MD;  Location: [facility];  Service: Orthopedics;  Laterality: Left;   REVERSE SHOULDER ARTHROPLASTY Left 07/25/2021   Procedure: LEFT REVERSE SHOULDER ARTHROPLASTY;  Surgeon: Hiram Gash, MD;  Location: [place];  Service: Orthopedics;  Laterality: Left;    There were no vitals filed for this visit.   Subjective Assessment - 10/17/21 1222     Subjective Pt reports she feels her left ankle has loosened up in the last few days. She reports trying to wear her CAM boot; however, she notes increased knee pain/swelling along quad muscle belly. She reports nothing else new or different. She states she had planned to bring in her pulley for PT to see it but forgot it in her car.    Pertinent History ORIF Lt ankle 07/23/21; reverse TSA 07/25/21    Patient Stated Goals get shoulder working again; fix her on hair; be  able to walk well    Currently in Pain? Yes    Pain Score 5     Pain Location Ankle    Pain Orientation Left    Pain Score 8    Pain Location Shoulder    Pain Orientation Left    Pain Descriptors / Indicators Aching                [facility] PT Assessment - 10/17/21 0001       Assessment   Medical Diagnosis Lt reverse TSA; Lt/Rt ankle fractures    Referring Provider (PT) Dr Altamese [place]; Dr Da Griffin Basil    Onset Date/Surgical Date 07/20/21    Hand Dominance Right    Next MD Visit 10/23/21; 11/14/21    Prior Therapy in hospital; rehab; home health                            [facility] - [facility] 0001       Ambulation/Gait   Ambulation/Gait Assistance 6: Modified independent (Device/Increase time)    Ambulation Distance (Feet) 120 Feet    Assistive device Small based quad cane   trialed SPC but pt too unsteady   Ambulation Surface Level;Indoor    Stairs Assistance 4: Min  guard    Stair Management Technique Step to pattern;With cane;Forwards    Number of Stairs 5    Height of Stairs --   4-6"   Gait Comments cues for symmetrical step length and heel/toe pattern      Exercises   Exercises Ankle      Knee/Hip Exercises: Stretches   Passive Hamstring Stretch Left;1 rep;30 seconds    Quad Stretch Left;30 seconds;2 reps    Other Knee/Hip Stretches Gastroc stretch supine x30 sec      Shoulder Exercises: Supine   Other Supine Exercises scap squeeze 10 sec x 5 reps      Shoulder Exercises: Stretch   Other Shoulder Stretches supine hands behind head 2x30 sec with pt trying to reach lower down her head      Manual Therapy   Soft tissue mobilization scar mobilization/cross friction massage, STM along L anterior chest, bicep, peroneals, ant tib    Passive ROM PROM Lt shoulder flexion; abduction and ER in scapular plane to pt tolerance. PROM L ankle into DF with gentle grade I to II distraction and subtalar joint inv/ev      Ankle Exercises:  Supine   T-Band red tband 10x3 sec hold PF, DF, inv, ev                     PT Education - 10/17/21 1225     Education Details Discussed gentle scar massage along her shoulder, weaning herself off CAM boot slowly and transitioning to quad cane. Updated HEP to include strengthening exercises for her ankle    Person(s) Educated Patient    Methods Explanation;Demonstration;Tactile cues;Verbal cues;Handout    Comprehension Verbalized understanding;Returned demonstration;Verbal cues required;Tactile cues required              PT Short Term Goals - 10/10/21 1300       PT SHORT TERM GOAL #1   Title Improve posture and alignment with patient to deonstrate improved upright posture with posterior shoudler girdle engaged therefore improving shoulder mobilty and function    Time 6    Period Weeks    Status New    Target Date 11/21/21      PT SHORT TERM GOAL #2   Title Increase AROM Lt shoulder to 100-110 degress flexion and scaption    Time 6    Period Weeks    Status New    Target Date 11/21/21      PT SHORT TERM GOAL #3   Title Increase AROM Lt ankle to 5 deg ankle DF and 30 deg PF    Time 6    Period Weeks    Status New    Target Date 11/21/21      PT SHORT TERM GOAL #4   Title Patient to demonstrate improved gait pattern with least assistive device for household distances of 100-120 ft    Time 6    Period Weeks    Status New    Target Date 11/21/21      PT SHORT TERM GOAL #5   Title Patient to negotiate 4 steps to enter and exit home with assistive device as needed (dies not have railing either side of steps)    Time 6    Period Weeks    Status New    Target Date 11/21/21               PT Long Term Goals - 10/10/21 1304       PT LONG TERM  GOAL #1   Title Functional AROM Lt shoulder allowing patient to return to normal functional activities    Time 12    Period Weeks    Status New    Target Date 01/02/22      PT LONG TERM GOAL #2   Title  4+/5 to 5/5 strength Lt shoulder and bilat ankles    Time 12    Period Weeks    Status New    Target Date 01/02/22      PT LONG TERM GOAL #3   Title Ambulation community distances of several hundred yards without assistive device    Time 12    Period Weeks    Status New    Target Date 01/02/22      PT LONG TERM GOAL #4   Title Independent in HEP(including aquatic program as indicated)    Time 12    Period Weeks    Status New    Target Date 01/02/22      PT LONG TERM GOAL #5   Title Improve functional limitation score to 58    Time 12    Period Weeks    Status New    Target Date 01/02/22                   Plan - 10/17/21 1227     Clinical Impression Statement Treatment focused on continuing manual work and PROM along L shoulder and ankle. Added ankle strengthening exercises with theraband. Discussed with pt that it may be good to alternate days where she does her shoulder exercises and then ankle exercises. Continued to work on gait with quad cane -- pt with improved gait pattern given cueing.    Personal Factors and Comorbidities Age;Fitness;Past/Current Experience;Comorbidity 1;Comorbidity 2    Comorbidities hypertension; osteoporesis    Examination-Activity Limitations Locomotion Level;Transfers;Reach Overhead;Bend;Sit;Carry;Sleep;Squat;Dressing;Stairs;Stand;Lift;Toileting    Examination-Participation Restrictions Church;Cleaning;Community Activity;Driving;Laundry;Occupation;Meal Prep;Shop;Volunteer    Stability/Clinical Decision Making Evolving/Moderate complexity    Rehab Potential Good    PT Frequency 3x / week    PT Duration 12 weeks    PT Treatment/Interventions ADLs/Self Care Home Management;Aquatic Therapy;Cryotherapy;Electrical Stimulation;Iontophoresis 4mg /ml Dexamethasone;Moist Heat;Ultrasound;Functional mobility training;Gait training;Stair training;Therapeutic activities;Therapeutic exercise;Balance training;Neuromuscular re-education;Patient/family  education;Manual techniques;Passive range of motion;Dry needling;Taping    PT Next Visit Plan review HEP; progress with shoulder and bilat anle rehab - deep tissue work vs DN for shoulder girdle(not discussed), PROM and stretching Rt shoulder; ROM, stretching, strengthening, gait training, stairs, progressing rehab as tolerated and per protocol; modalities as indicated    PT Home Exercise Plan [id number]    Consulted and Agree with Plan of Care Patient             Patient will benefit from skilled therapeutic intervention in order to improve the following deficits and impairments:  Abnormal gait, Decreased range of motion, Increased fascial restricitons, Decreased activity tolerance, Pain, Decreased balance, Impaired flexibility, Improper body mechanics, Decreased mobility, Decreased strength, Postural dysfunction  Visit Diagnosis: Acute pain of left shoulder  Scapular dyskinesis  Muscle weakness (generalized)  Pain in left ankle and joints of left foot  Other abnormalities of gait and mobility     Problem List Patient Active Problem List   Diagnosis Date Noted   Pressure injury of skin of sacral region 08/10/2021   Acute cystitis without hematuria 08/10/2021   Acute blood loss anemia    History of hypertension    Drug induced constipation    Postoperative pain    Critical polytrauma 07/27/2021   Humerus  fracture 07/21/2021   Tibial plateau fracture, left 07/21/2021   Nondisplaced fracture of fifth metatarsal bone, right foot, initial encounter for closed fracture 07/21/2021   Nondisplaced fracture of lateral malleolus of right fibula, initial encounter for closed fracture 07/21/2021   Left trimalleolar fracture, closed, initial encounter 07/21/2021   Fall 07/20/2021   Urine frequency 04/29/2019   Chest pain 04/29/2019   Urinary frequency 04/29/2019   Post-menopausal 04/29/2019   Allergic rhinitis 10/13/2018   Mild intermittent asthma with acute exacerbation 10/13/2018    Gastroesophageal reflux disease 10/13/2018   Right hip pain 06/01/2018   Chronic low back pain 05/25/2018   Acute pain of right knee 05/25/2018   Thoracic aorta atherosclerosis ([place] [place]) 05/25/2018   Generalized anxiety disorder 03/22/2017   Hyperlipidemia LDL goal <100 03/22/2017   Hypothyroidism 03/22/2017   Osteoporosis 12/20/2016   Encounter for counseling 09/18/2016   Morbid obesity ([place]) 02/06/2016   Vaginal dryness 02/06/2016   Allergic rhinitis due to pollen 02/01/2016   Acute maxillary sinusitis 02/01/2016   Essential hypertension 02/01/2016   Asthma with acute exacerbation 02/01/2016   Lumbar radiculopathy, acute 11/01/2015   Osteoporosis, 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   Adjustment disorder with anxiety 02/08/2015   Abnormal weight gain 02/08/2015    [facility] April Ma L Dagny Fiorentino, [place], DPT 10/17/2021, 12:38 PM  [facility] [place], [place], [address] Phone: [phone]   Fax:  [phone]  Name: Kathryn Boyer MRN: [id number] Date of Birth: 10-26-1955

## 2021-10-18 DIAGNOSIS — M8000XA Age-related osteoporosis with current pathological fracture, unspecified site, initial encounter for fracture: Secondary | ICD-10-CM | POA: Diagnosis not present

## 2021-10-19 ENCOUNTER — Other Ambulatory Visit: Payer: Self-pay

## 2021-10-19 ENCOUNTER — Ambulatory Visit: Payer: BC Managed Care – PPO | Admitting: Physical Therapy

## 2021-10-19 DIAGNOSIS — G2589 Other specified extrapyramidal and movement disorders: Secondary | ICD-10-CM

## 2021-10-19 DIAGNOSIS — I10 Essential (primary) hypertension: Secondary | ICD-10-CM | POA: Diagnosis not present

## 2021-10-19 DIAGNOSIS — R2689 Other abnormalities of gait and mobility: Secondary | ICD-10-CM

## 2021-10-19 DIAGNOSIS — M25571 Pain in right ankle and joints of right foot: Secondary | ICD-10-CM

## 2021-10-19 DIAGNOSIS — M6281 Muscle weakness (generalized): Secondary | ICD-10-CM

## 2021-10-19 DIAGNOSIS — S42202D Unspecified fracture of upper end of left humerus, subsequent encounter for fracture with routine healing: Secondary | ICD-10-CM | POA: Diagnosis not present

## 2021-10-19 DIAGNOSIS — M25512 Pain in left shoulder: Secondary | ICD-10-CM | POA: Diagnosis not present

## 2021-10-19 DIAGNOSIS — M810 Age-related osteoporosis without current pathological fracture: Secondary | ICD-10-CM | POA: Diagnosis not present

## 2021-10-19 DIAGNOSIS — M25572 Pain in left ankle and joints of left foot: Secondary | ICD-10-CM | POA: Diagnosis not present

## 2021-10-19 DIAGNOSIS — S82891D Other fracture of right lower leg, subsequent encounter for closed fracture with routine healing: Secondary | ICD-10-CM | POA: Diagnosis not present

## 2021-10-19 DIAGNOSIS — Z96619 Presence of unspecified artificial shoulder joint: Secondary | ICD-10-CM | POA: Diagnosis not present

## 2021-10-19 DIAGNOSIS — X58XXXD Exposure to other specified factors, subsequent encounter: Secondary | ICD-10-CM | POA: Diagnosis not present

## 2021-10-19 DIAGNOSIS — S82892D Other fracture of left lower leg, subsequent encounter for closed fracture with routine healing: Secondary | ICD-10-CM | POA: Diagnosis not present

## 2021-10-22 ENCOUNTER — Ambulatory Visit: Payer: BC Managed Care – PPO | Admitting: Physical Therapy

## 2021-10-22 ENCOUNTER — Other Ambulatory Visit: Payer: Self-pay

## 2021-10-22 DIAGNOSIS — M810 Age-related osteoporosis without current pathological fracture: Secondary | ICD-10-CM | POA: Diagnosis not present

## 2021-10-22 DIAGNOSIS — M25571 Pain in right ankle and joints of right foot: Secondary | ICD-10-CM

## 2021-10-22 DIAGNOSIS — I10 Essential (primary) hypertension: Secondary | ICD-10-CM | POA: Diagnosis not present

## 2021-10-22 DIAGNOSIS — X58XXXD Exposure to other specified factors, subsequent encounter: Secondary | ICD-10-CM | POA: Diagnosis not present

## 2021-10-22 DIAGNOSIS — Z96619 Presence of unspecified artificial shoulder joint: Secondary | ICD-10-CM | POA: Diagnosis not present

## 2021-10-22 DIAGNOSIS — S42202D Unspecified fracture of upper end of left humerus, subsequent encounter for fracture with routine healing: Secondary | ICD-10-CM | POA: Diagnosis not present

## 2021-10-22 DIAGNOSIS — M25572 Pain in left ankle and joints of left foot: Secondary | ICD-10-CM | POA: Diagnosis not present

## 2021-10-22 DIAGNOSIS — R2689 Other abnormalities of gait and mobility: Secondary | ICD-10-CM

## 2021-10-22 DIAGNOSIS — S82892D Other fracture of left lower leg, subsequent encounter for closed fracture with routine healing: Secondary | ICD-10-CM | POA: Diagnosis not present

## 2021-10-22 DIAGNOSIS — M25512 Pain in left shoulder: Secondary | ICD-10-CM

## 2021-10-22 DIAGNOSIS — M6281 Muscle weakness (generalized): Secondary | ICD-10-CM | POA: Diagnosis not present

## 2021-10-22 DIAGNOSIS — G2589 Other specified extrapyramidal and movement disorders: Secondary | ICD-10-CM | POA: Diagnosis not present

## 2021-10-22 DIAGNOSIS — S82891D Other fracture of right lower leg, subsequent encounter for closed fracture with routine healing: Secondary | ICD-10-CM | POA: Diagnosis not present

## 2021-10-22 NOTE — Therapy (Signed)
[place], [place], [address] Phone: [phone]   Fax:  [phone]  Physical Therapy Treatment  Patient Details  Name: Kathryn Boyer MRN: [id number] Date of Birth: 1956/05/25 Referring Provider (PT): Dr Altamese McKinnon; Dr Da Griffin Basil   Encounter Date: 10/22/2021   PT End of Session - 10/22/21 1037     Visit Number 5    Number of Visits 30    Date for PT Re-Evaluation 01/02/22    Authorization Type BCBS    PT Start Time 1020    PT Stop Time 1100    PT Time Calculation (min) 40 min    Activity Tolerance Patient tolerated treatment well    Behavior During Therapy [facility] for tasks assessed/performed             Past Medical History:  Diagnosis Date   Anxiety    Asthma    Hyperlipidemia    Hypertension    Plantar fasciitis    Thyroid disease     Past Surgical History:  Procedure Laterality Date   ABDOMINAL HYSTERECTOMY     BREAST SURGERY     NOSE SURGERY     ORIF ANKLE FRACTURE Left 07/23/2021   Procedure: OPEN REDUCTION INTERNAL FIXATION (ORIF) ANKLE FRACTURE;  Surgeon: Altamese Latimer, MD;  Location: [place];  Service: Orthopedics;  Laterality: Left;   REVERSE SHOULDER ARTHROPLASTY Left 07/25/2021   Procedure: LEFT REVERSE SHOULDER ARTHROPLASTY;  Surgeon: Hiram Gash, MD;  Location: [place];  Service: Orthopedics;  Laterality: Left;    There were no vitals filed for this visit.                      [place] Adult PT Treatment/Exercise - 10/22/21 1048       Ambulation/Gait   Ambulation/Gait Assistance 4: Min guard    Ambulation Distance (Feet) 80 Feet    Assistive device Straight cane    Gait Pattern Antalgic;Left flexed knee in stance;Lateral hip instability;Decreased weight shift to left      Manual Therapy   Manual therapy comments gentle ankle distraction and talocrural joint mob for DF grade II to III    Soft tissue mobilization STM & TPR ant tib, post tib,  peroneals    Passive ROM PROM Lt shoulder flexion; abduction and ER in scapular plane to pt tolerance. PROM L ankle into DF      Ankle Exercises: Supine   Other Supine Ankle Exercises SLR red tband 2x10      Ankle Exercises: Sidelying   Other Sidelying Ankle Exercises Clamshell red tband 2x10      Ankle Exercises: Stretches   Soleus Stretch 30 seconds    Gastroc Stretch 30 seconds    Other Stretch manual stretch ant tib, post tib, peroneals, toe flexors and extensors      Ankle Exercises: Standing   Heel Raises Both;20 reps    Other Standing Ankle Exercises L foot on 4" step, knee bend forward/back x10                       PT Short Term Goals - 10/10/21 1300       PT SHORT TERM GOAL #1   Title Improve posture and alignment with patient to deonstrate improved upright posture with posterior shoudler girdle engaged therefore improving shoulder mobilty and function    Time 6    Period Weeks    Status New    Target Date  11/21/21      PT SHORT TERM GOAL #2   Title Increase AROM Lt shoulder to 100-110 degress flexion and scaption    Time 6    Period Weeks    Status New    Target Date 11/21/21      PT SHORT TERM GOAL #3   Title Increase AROM Lt ankle to 5 deg ankle DF and 30 deg PF    Time 6    Period Weeks    Status New    Target Date 11/21/21      PT SHORT TERM GOAL #4   Title Patient to demonstrate improved gait pattern with least assistive device for household distances of 100-120 ft    Time 6    Period Weeks    Status New    Target Date 11/21/21      PT SHORT TERM GOAL #5   Title Patient to negotiate 4 steps to enter and exit home with assistive device as needed (dies not have railing either side of steps)    Time 6    Period Weeks    Status New    Target Date 11/21/21               PT Long Term Goals - 10/10/21 1304       PT LONG TERM GOAL #1   Title Functional AROM Lt shoulder allowing patient to return to normal functional activities     Time 12    Period Weeks    Status New    Target Date 01/02/22      PT LONG TERM GOAL #2   Title 4+/5 to 5/5 strength Lt shoulder and bilat ankles    Time 12    Period Weeks    Status New    Target Date 01/02/22      PT LONG TERM GOAL #3   Title Ambulation community distances of several hundred yards without assistive device    Time 12    Period Weeks    Status New    Target Date 01/02/22      PT LONG TERM GOAL #4   Title Independent in HEP(including aquatic program as indicated)    Time 12    Period Weeks    Status New    Target Date 01/02/22      PT LONG TERM GOAL #5   Title Improve functional limitation score to 58    Time 12    Period Weeks    Status New    Target Date 01/02/22                   Plan - 10/22/21 1112     Clinical Impression Statement Provided continued PROM for L shoulder and ankle. This session worked primarily on ankle mobility and strengthening. Reviewed shoulder AAROM exercises. Pt with continued knee pain -- quad is very weak and initiated strengthening to address this.    Personal Factors and Comorbidities Age;Fitness;Past/Current Experience;Comorbidity 1;Comorbidity 2    Comorbidities hypertension; osteoporesis    Examination-Activity Limitations Locomotion Level;Transfers;Reach Overhead;Bend;Sit;Carry;Sleep;Squat;Dressing;Stairs;Stand;Lift;Toileting    Examination-Participation Restrictions Church;Cleaning;Community Activity;Driving;Laundry;Occupation;Meal Prep;Shop;Volunteer    Stability/Clinical Decision Making Evolving/Moderate complexity    Rehab Potential Good    PT Frequency 3x / week    PT Duration 12 weeks    PT Treatment/Interventions ADLs/Self Care Home Management;Aquatic Therapy;Cryotherapy;Electrical Stimulation;Iontophoresis 4mg /ml Dexamethasone;Moist Heat;Ultrasound;Functional mobility training;Gait training;Stair training;Therapeutic activities;Therapeutic exercise;Balance training;Neuromuscular  re-education;Patient/family education;Manual techniques;Passive range of motion;Dry needling;Taping    PT Next Visit Plan  review HEP; progress with shoulder and bilat anle rehab - deep tissue work vs DN for shoulder girdle(not discussed), PROM and stretching Rt shoulder; ROM, stretching, strengthening, gait training, stairs, progressing rehab as tolerated and per protocol; modalities as indicated    PT Home Exercise Plan [id number]    Consulted and Agree with Plan of Care Patient             Patient will benefit from skilled therapeutic intervention in order to improve the following deficits and impairments:  Abnormal gait, Decreased range of motion, Increased fascial restricitons, Decreased activity tolerance, Pain, Decreased balance, Impaired flexibility, Improper body mechanics, Decreased mobility, Decreased strength, Postural dysfunction  Visit Diagnosis: Acute pain of left shoulder  Scapular dyskinesis  Muscle weakness (generalized)  Pain in left ankle and joints of left foot  Other abnormalities of gait and mobility  Pain in right ankle and joints of right foot     Problem List Patient Active Problem List   Diagnosis Date Noted   Pressure injury of skin of sacral region 08/10/2021   Acute cystitis without hematuria 08/10/2021   Acute blood loss anemia    History of hypertension    Drug induced constipation    Postoperative pain    Critical polytrauma 07/27/2021   Humerus fracture 07/21/2021   Tibial plateau fracture, left 07/21/2021   Nondisplaced fracture of fifth metatarsal bone, right foot, initial encounter for closed fracture 07/21/2021   Nondisplaced fracture of lateral malleolus of right fibula, initial encounter for closed fracture 07/21/2021   Left trimalleolar fracture, closed, initial encounter 07/21/2021   Fall 07/20/2021   Urine frequency 04/29/2019   Chest pain 04/29/2019   Urinary frequency 04/29/2019   Post-menopausal 04/29/2019   Allergic rhinitis  10/13/2018   Mild intermittent asthma with acute exacerbation 10/13/2018   Gastroesophageal reflux disease 10/13/2018   Right hip pain 06/01/2018   Chronic low back pain 05/25/2018   Acute pain of right knee 05/25/2018   Thoracic aorta atherosclerosis ([place]) 05/25/2018   Generalized anxiety disorder 03/22/2017   Hyperlipidemia LDL goal <100 03/22/2017   Hypothyroidism 03/22/2017   Osteoporosis 12/20/2016   Encounter for counseling 09/18/2016   Morbid obesity ([place]) 02/06/2016   Vaginal dryness 02/06/2016   Allergic rhinitis due to pollen 02/01/2016   Acute maxillary sinusitis 02/01/2016   Essential hypertension 02/01/2016   Asthma with acute exacerbation 02/01/2016   Lumbar radiculopathy, acute 11/01/2015   Osteoporosis, 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   Adjustment disorder with anxiety 02/08/2015   Abnormal weight gain 02/08/2015    [facility] April Gordy Levan, PT, DPT 10/22/2021, 1:05 PM  [facility] [place] [address] [place], [place], [address] Phone: [phone]   Fax:  [phone]  Name: Kathryn Boyer MRN: [id number] Date of Birth: Jul 23, 1956

## 2021-10-22 NOTE — Therapy (Signed)
[place], [place], [address] Phone: [phone]   Fax:  [phone]  Physical Therapy Treatment  Patient Details  Name: Kathryn Boyer MRN: [id number] Date of Birth: [birth year] Referring Provider (PT): Dr Altamese [place]; Dr Da Griffin Basil   Encounter Date: 10/19/2021   PT End of Session - 10/22/21 0819     Visit Number 4    Number of Visits 30    Date for PT Re-Evaluation 01/02/22    Authorization Type BCBS    PT Start Time 1315    PT Stop Time 1400    PT Time Calculation (min) 45 min    Activity Tolerance Patient tolerated treatment well    Behavior During Therapy [facility] for tasks assessed/performed             Past Medical History:  Diagnosis Date   Anxiety    Asthma    Hyperlipidemia    Hypertension    Plantar fasciitis    Thyroid disease     Past Surgical History:  Procedure Laterality Date   ABDOMINAL HYSTERECTOMY     BREAST SURGERY     NOSE SURGERY     ORIF ANKLE FRACTURE Left 07/23/2021   Procedure: OPEN REDUCTION INTERNAL FIXATION (ORIF) ANKLE FRACTURE;  Surgeon: Altamese [place], MD;  Location: [place];  Service: Orthopedics;  Laterality: Left;   REVERSE SHOULDER ARTHROPLASTY Left 07/25/2021   Procedure: LEFT REVERSE SHOULDER ARTHROPLASTY;  Surgeon: Hiram Gash, MD;  Location: [place];  Service: Orthopedics;  Laterality: Left;    There were no vitals filed for this visit.   Subjective Assessment - 10/22/21 0817     Subjective Pt reports she feels her left ankle has loosened up in the last few days. She reports trying to wear her CAM boot; however, she notes increased knee pain/swelling along quad muscle belly. She reports nothing else new or different. She states she had planned to bring in her pulley for PT to see it but forgot it in her car.    Pertinent History ORIF Lt ankle 07/23/21; reverse TSA 07/25/21    Patient Stated Goals get shoulder working again; fix her on hair; be  able to walk well                               [facility] Adult PT Treatment/Exercise - 10/22/21 0001       Ambulation/Gait   Ambulation/Gait Assistance 6: Modified independent (Device/Increase time)    Ambulation Distance (Feet) 80 Feet    Assistive device Small based quad cane   with CAM boot and heel lift   Gait Pattern Antalgic;Lateral hip instability      Shoulder Exercises: Supine   External Rotation AAROM;Left;5 reps    External Rotation Limitations dowel to assist x10 sec hold    Flexion AAROM;Left;5 reps    Flexion Limitations assist with Rt UE for 10 sec end range stretch    ABduction AAROM;10 reps    ABduction Limitations assist with dowel x10 sec    Other Supine Exercises hands behind head      Shoulder Exercises: Stretch   Other Shoulder Stretches Seated IR stretch with towel 5x10 sec    Other Shoulder Stretches Seated IR stretch with dowel 5x10 sec      Manual Therapy   Manual therapy comments gentle ankle distraction and talocrural joint mob for DF grade II to III    Passive  ROM PROM Lt shoulder flexion; abduction and ER in scapular plane to pt tolerance. PROM L ankle into DF with gentle grade I to II distraction and subtalar joint inv/ev                       PT Short Term Goals - 10/10/21 1300       PT SHORT TERM GOAL #1   Title Improve posture and alignment with patient to deonstrate improved upright posture with posterior shoudler girdle engaged therefore improving shoulder mobilty and function    Time 6    Period Weeks    Status New    Target Date 11/21/21      PT SHORT TERM GOAL #2   Title Increase AROM Lt shoulder to 100-110 degress flexion and scaption    Time 6    Period Weeks    Status New    Target Date 11/21/21      PT SHORT TERM GOAL #3   Title Increase AROM Lt ankle to 5 deg ankle DF and 30 deg PF    Time 6    Period Weeks    Status New    Target Date 11/21/21      PT SHORT TERM GOAL #4   Title Patient  to demonstrate improved gait pattern with least assistive device for household distances of 100-120 ft    Time 6    Period Weeks    Status New    Target Date 11/21/21      PT SHORT TERM GOAL #5   Title Patient to negotiate 4 steps to enter and exit home with assistive device as needed (dies not have railing either side of steps)    Time 6    Period Weeks    Status New    Target Date 11/21/21               PT Long Term Goals - 10/10/21 1304       PT LONG TERM GOAL #1   Title Functional AROM Lt shoulder allowing patient to return to normal functional activities    Time 12    Period Weeks    Status New    Target Date 01/02/22      PT LONG TERM GOAL #2   Title 4+/5 to 5/5 strength Lt shoulder and bilat ankles    Time 12    Period Weeks    Status New    Target Date 01/02/22      PT LONG TERM GOAL #3   Title Ambulation community distances of several hundred yards without assistive device    Time 12    Period Weeks    Status New    Target Date 01/02/22      PT LONG TERM GOAL #4   Title Independent in HEP(including aquatic program as indicated)    Time 12    Period Weeks    Status New    Target Date 01/02/22      PT LONG TERM GOAL #5   Title Improve functional limitation score to 58    Time 12    Period Weeks    Status New    Target Date 01/02/22                   Plan - 10/22/21 0817     Clinical Impression Statement Treatment focused on continuing manual work and PROM along L shoulder and ankle. Worked primarily on shoulder AAROM this session.  Continues to have knee pain possibly due to reduced ankle DF placing increased pressure into her knee. Trialed heel lift. Attempted to work on gait; however, with CAM boot pt with uneven leg lengths causing antalgic pattern.    Personal Factors and Comorbidities Age;Fitness;Past/Current Experience;Comorbidity 1;Comorbidity 2    Comorbidities hypertension; osteoporesis    Examination-Activity Limitations  Locomotion Level;Transfers;Reach Overhead;Bend;Sit;Carry;Sleep;Squat;Dressing;Stairs;Stand;Lift;Toileting    Examination-Participation Restrictions Church;Cleaning;Community Activity;Driving;Laundry;Occupation;Meal Prep;Shop;Volunteer    Stability/Clinical Decision Making Evolving/Moderate complexity    Rehab Potential Good    PT Frequency 3x / week    PT Duration 12 weeks    PT Treatment/Interventions ADLs/Self Care Home Management;Aquatic Therapy;Cryotherapy;Electrical Stimulation;Iontophoresis 4mg /ml Dexamethasone;Moist Heat;Ultrasound;Functional mobility training;Gait training;Stair training;Therapeutic activities;Therapeutic exercise;Balance training;Neuromuscular re-education;Patient/family education;Manual techniques;Passive range of motion;Dry needling;Taping    PT Next Visit Plan review HEP; progress with shoulder and bilat anle rehab - deep tissue work vs DN for shoulder girdle(not discussed), PROM and stretching Rt shoulder; ROM, stretching, strengthening, gait training, stairs, progressing rehab as tolerated and per protocol; modalities as indicated    PT Home Exercise Plan [id number]    Consulted and Agree with Plan of Care Patient             Patient will benefit from skilled therapeutic intervention in order to improve the following deficits and impairments:  Abnormal gait, Decreased range of motion, Increased fascial restricitons, Decreased activity tolerance, Pain, Decreased balance, Impaired flexibility, Improper body mechanics, Decreased mobility, Decreased strength, Postural dysfunction  Visit Diagnosis: Acute pain of left shoulder  Scapular dyskinesis  Muscle weakness (generalized)  Pain in left ankle and joints of left foot  Other abnormalities of gait and mobility  Pain in right ankle and joints of right foot     Problem List Patient Active Problem List   Diagnosis Date Noted   Pressure injury of skin of sacral region 08/10/2021   Acute cystitis without  hematuria 08/10/2021   Acute blood loss anemia    History of hypertension    Drug induced constipation    Postoperative pain    Critical polytrauma 07/27/2021   Humerus fracture 07/21/2021   Tibial plateau fracture, left 07/21/2021   Nondisplaced fracture of fifth metatarsal bone, right foot, initial encounter for closed fracture 07/21/2021   Nondisplaced fracture of lateral malleolus of right fibula, initial encounter for closed fracture 07/21/2021   Left trimalleolar fracture, closed, initial encounter 07/21/2021   Fall 07/20/2021   Urine frequency 04/29/2019   Chest pain 04/29/2019   Urinary frequency 04/29/2019   Post-menopausal 04/29/2019   Allergic rhinitis 10/13/2018   Mild intermittent asthma with acute exacerbation 10/13/2018   Gastroesophageal reflux disease 10/13/2018   Right hip pain 06/01/2018   Chronic low back pain 05/25/2018   Acute pain of right knee 05/25/2018   Thoracic aorta atherosclerosis ([place]) 05/25/2018   Generalized anxiety disorder 03/22/2017   Hyperlipidemia LDL goal <100 03/22/2017   Hypothyroidism 03/22/2017   Osteoporosis 12/20/2016   Encounter for counseling 09/18/2016   Morbid obesity ([place]) 02/06/2016   Vaginal dryness 02/06/2016   Allergic rhinitis due to pollen 02/01/2016   Acute maxillary sinusitis 02/01/2016   Essential hypertension 02/01/2016   Asthma with acute exacerbation 02/01/2016   Lumbar radiculopathy, acute 11/01/2015   Osteoporosis, 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   Adjustment disorder with anxiety 02/08/2015   Abnormal weight gain 02/08/2015    [facility] April Gordy Levan, PT, DPT 10/22/2021, 8:22 AM  [facility] [place] [address] [place], [place], [address] Phone: [phone]   Fax:  [phone]  Name: Kathryn Boyer MRN: [id number] Date of Birth: November 16, 1955

## 2021-10-23 ENCOUNTER — Encounter: Payer: BC Managed Care – PPO | Admitting: Physical Therapy

## 2021-10-23 DIAGNOSIS — S42252D Displaced fracture of greater tuberosity of left humerus, subsequent encounter for fracture with routine healing: Secondary | ICD-10-CM | POA: Diagnosis not present

## 2021-10-24 ENCOUNTER — Encounter: Payer: Self-pay | Admitting: Rehabilitative and Restorative Service Providers"

## 2021-10-24 ENCOUNTER — Ambulatory Visit
Payer: BC Managed Care – PPO | Attending: Orthopedic Surgery | Admitting: Rehabilitative and Restorative Service Providers"

## 2021-10-24 ENCOUNTER — Other Ambulatory Visit: Payer: Self-pay

## 2021-10-24 DIAGNOSIS — M25571 Pain in right ankle and joints of right foot: Secondary | ICD-10-CM | POA: Diagnosis not present

## 2021-10-24 DIAGNOSIS — Z09 Encounter for follow-up examination after completed treatment for conditions other than malignant neoplasm: Secondary | ICD-10-CM | POA: Diagnosis not present

## 2021-10-24 DIAGNOSIS — R2689 Other abnormalities of gait and mobility: Secondary | ICD-10-CM | POA: Diagnosis not present

## 2021-10-24 DIAGNOSIS — G2589 Other specified extrapyramidal and movement disorders: Secondary | ICD-10-CM | POA: Insufficient documentation

## 2021-10-24 DIAGNOSIS — M25512 Pain in left shoulder: Secondary | ICD-10-CM | POA: Diagnosis not present

## 2021-10-24 DIAGNOSIS — M25572 Pain in left ankle and joints of left foot: Secondary | ICD-10-CM | POA: Diagnosis not present

## 2021-10-24 DIAGNOSIS — M6281 Muscle weakness (generalized): Secondary | ICD-10-CM | POA: Insufficient documentation

## 2021-10-24 NOTE — Patient Instructions (Signed)
Access Code: [email or web address] ?URL: [email or web address] ?Date: 10/24/2021 ?Prepared by: Corlis Leak ? ?Exercises ?Hooklying Hamstring Stretch with Strap - 2 x daily - 7 x weekly - 1 sets - 3 reps - 30 sec hold ?Supine ITB Stretch with Strap - 2 x daily - 7 x weekly - 1 sets - 3 reps - 30 sec hold ?Supine Piriformis Stretch with Leg Straight - 2 x daily - 7 x weekly - 1 sets - 3 reps - 30 sec hold ?Supine Scapular Retraction - 2 x daily - 7 x weekly - 1 sets - 10 reps - 10 sec hold ?Seated Ankle Dorsiflexion Stretch - 2 x daily - 7 x weekly - 1 sets - 5-10 reps - 10 sec hold ?Seated Shoulder Flexion AAROM with Pulley Behind - 2 x daily - 7 x weekly - 1 sets - 10 reps - 10 sec hold ?Seated Shoulder Scaption AAROM with Pulley at Side - 2 x daily - 7 x weekly - 1 sets - 10 reps - 10sec hold ?Supine Shoulder Flexion AAROM - 2 x daily - 7 x weekly - 1 sets - 5 reps - 5 sec hold ?Supine Chest Stretch on Foam Roll - 2 x daily - 7 x weekly - 1 sets - 1 reps - 2-5 min sec hold ?Prone Quadriceps Stretch with Strap - 1 x daily - 7 x weekly - 2 sets - 30 sec hold ?Supine Chest Stretch with Elbows Bent - 1 x daily - 7 x weekly - 2 sets - 30 sec hold ?Long Sitting Ankle Eversion with Resistance - 1 x daily - 7 x weekly - 2 sets - 10 reps ?Long Sitting Ankle Plantar Flexion with Resistance - 1 x daily - 7 x weekly - 2 sets - 10 reps ?Long Sitting Ankle Inversion with Resistance - 1 x daily - 7 x weekly - 2 sets - 10 reps ?Standing Shoulder Internal Rotation Stretch with Towel - 1 x daily - 7 x weekly - 1 sets - 5-10 reps - 10 sec hold ?Supine Hip Flexion with Resistance - 1 x daily - 7 x weekly - 2 sets - 10 reps ?Standing Shoulder Internal Rotation AAROM with Dowel - 1 x daily - 7 x weekly - 1 sets - 5-10 reps - 10-20 sec hold ?Supine Shoulder Abduction AAROM with Dowel - 1 x daily - 7 x weekly - 1 sets - 5-10 reps - 10 sec hold ?Supine Shoulder External Rotation with Dowel - 1 x daily - 7 x weekly - 1 sets - 5-10 reps - 10  sec hold ?Standing Gastroc Stretch on Step - 1 x daily - 7 x weekly - 2 sets - 30 sec hold ?Standing Ankle Dorsiflexion Stretch on Chair - 1 x daily - 7 x weekly - 2 sets - 30 sec hold ?Seated Shoulder Row with Anchored Resistance - 1 x daily - 7 x weekly - 1-2 sets - 10 reps - 5 sec hold ?Seated Shoulder External Rotation with Resistance - 1 x daily - 7 x weekly - 1-2 sets - 3 reps - 30 sec hold ?Seated Shoulder Internal Rotation with Anchored Resistance - 1 x daily - 7 x weekly - 1-2 sets - 10 reps - 5 sec hold ?Seated Shoulder Extension with Resistance - 1 x daily - 7 x weekly - 1-2 sets - 10 reps - 5 sec hold ?Standing Shoulder and Trunk Flexion at Table - 2 x daily - 7 x weekly - 1 sets - 5  reps - 10 sec hold ?Standing Shoulder Extension ROM with Dowel - 2 x daily - 7 x weekly - 1 sets - 3-5 reps - 10 sec hold ? ?

## 2021-10-24 NOTE — Therapy (Signed)
[facility] [place] [address] [place], [place], [address] Phone: [phone]   Fax:  [phone]  Physical Therapy Treatment  Patient Details  Name: Kathryn Boyer MRN: [id number] Date of Birth: 1956/02/15 Referring Provider (PT): Dr Myrene Galas; Dr Da Everardo Pacific   Encounter Date: 10/24/2021   PT End of Session - 10/24/21 1016     Visit Number 6    Number of Visits 30    Date for PT Re-Evaluation 01/02/22    Authorization Type BCBS    PT Start Time 1014    PT Stop Time 1102    PT Time Calculation (min) 48 min    Activity Tolerance Patient tolerated treatment well             Past Medical History:  Diagnosis Date   Anxiety    Asthma    Hyperlipidemia    Hypertension    Plantar fasciitis    Thyroid disease     Past Surgical History:  Procedure Laterality Date   ABDOMINAL HYSTERECTOMY     BREAST SURGERY     NOSE SURGERY     ORIF ANKLE FRACTURE Left 07/23/2021   Procedure: OPEN REDUCTION INTERNAL FIXATION (ORIF) ANKLE FRACTURE;  Surgeon: Myrene Galas, MD;  Location: [facility] OR;  Service: Orthopedics;  Laterality: Left;   REVERSE SHOULDER ARTHROPLASTY Left 07/25/2021   Procedure: LEFT REVERSE SHOULDER ARTHROPLASTY;  Surgeon: Bjorn Pippin, MD;  Location: [facility] OR;  Service: Orthopedics;  Laterality: Left;    There were no vitals filed for this visit.   Subjective Assessment - 10/24/21 1016     Subjective Knee and shoulder are hurting. She saw Dr Austin Miles PA yesterday. Patient is to progress with shoulder rehab now over 12 weeks post Reverse TSA.    Currently in Pain? Yes    Pain Score 2     Pain Location Ankle    Pain Score 3    Pain Location Shoulder    Pain Orientation Left                               [facility] Adult PT Treatment/Exercise - 10/24/21 0001       Ambulation/Gait   Ambulation/Gait Assistance 4: Min guard    Ambulation Distance (Feet) 80 Feet    Assistive device Small based  quad cane    Gait Pattern Antalgic;Left flexed knee in stance;Lateral hip instability;Decreased weight shift to left    Stairs Yes    Stairs Assistance 4: Min guard    Stair Management Technique Step to pattern;With cane;Forwards    Number of Stairs 5    Height of Stairs --   4 and 6 inch   Gait Comments practiced ascending and descending 4 and 6 inch stairs with quad cane and one railing x 5 trials      Neuro Re-ed    Neuro Re-ed Details  working on posture and alignent with verbal and tactile cues to improve scapular control in sititng at mirror      Shoulder Exercises: Seated   External Rotation Strengthening;Left;10 reps;Theraband    Theraband Level (Shoulder External Rotation) Level 2 (Red)    Internal Rotation Strengthening;Left;10 reps;Theraband    Theraband Level (Shoulder Internal Rotation) Level 2 (Red)    Abduction Strengthening;Left;10 reps;Theraband    Theraband Level (Shoulder ABduction) Level 2 (Red)    Other Seated Exercises rowing bilat red TB x 10 reps  Shoulder Exercises: Pulleys   Flexion Limitations 10 sec hold x 8 VC/TC to keep Lt shoulder girdle down and back    Scaption Limitations 10 sec x 8 reps VC/TC to keep Lt shoulder girdle down and back      Shoulder Exercises: ROM/Strengthening   Other ROM/Strengthening Exercises working shoulder ROM focus on scapular control sitting at mirror      Shoulder Exercises: Stretch   Internal Rotation Stretch 3 reps    Internal Rotation Stretch Limitations cane behind back pulling across hips 10 sec hold    Table Stretch - Flexion 5 reps;10 seconds    Table Stretch -Flexion Limitations standing at counter stepping back    Other Shoulder Stretches extension cane behind back in standing 10 sec hold x 3 reps                     PT Education - 10/24/21 1056     Education Details HEP    Person(s) Educated Patient    Methods Explanation;Demonstration;Tactile cues;Verbal cues;Handout    Comprehension  Verbalized understanding;Returned demonstration;Verbal cues required;Tactile cues required              PT Short Term Goals - 10/10/21 1300       PT SHORT TERM GOAL #1   Title Improve posture and alignment with patient to deonstrate improved upright posture with posterior shoudler girdle engaged therefore improving shoulder mobilty and function    Time 6    Period Weeks    Status New    Target Date 11/21/21      PT SHORT TERM GOAL #2   Title Increase AROM Lt shoulder to 100-110 degress flexion and scaption    Time 6    Period Weeks    Status New    Target Date 11/21/21      PT SHORT TERM GOAL #3   Title Increase AROM Lt ankle to 5 deg ankle DF and 30 deg PF    Time 6    Period Weeks    Status New    Target Date 11/21/21      PT SHORT TERM GOAL #4   Title Patient to demonstrate improved gait pattern with least assistive device for household distances of 100-120 ft    Time 6    Period Weeks    Status New    Target Date 11/21/21      PT SHORT TERM GOAL #5   Title Patient to negotiate 4 steps to enter and exit home with assistive device as needed (dies not have railing either side of steps)    Time 6    Period Weeks    Status New    Target Date 11/21/21               PT Long Term Goals - 10/10/21 1304       PT LONG TERM GOAL #1   Title Functional AROM Lt shoulder allowing patient to return to normal functional activities    Time 12    Period Weeks    Status New    Target Date 01/02/22      PT LONG TERM GOAL #2   Title 4+/5 to 5/5 strength Lt shoulder and bilat ankles    Time 12    Period Weeks    Status New    Target Date 01/02/22      PT LONG TERM GOAL #3   Title Ambulation community distances of several hundred yards without assistive device  Time 12    Period Weeks    Status New    Target Date 01/02/22      PT LONG TERM GOAL #4   Title Independent in HEP(including aquatic program as indicated)    Time 12    Period Weeks    Status New     Target Date 01/02/22      PT LONG TERM GOAL #5   Title Improve functional limitation score to 58    Time 12    Period Weeks    Status New    Target Date 01/02/22                   Plan - 10/24/21 1030     Clinical Impression Statement Continued rehab working on scapular alignment and control; reviewing shoulder exercises for AAROM; adding resistive exercises using red TB for HEP. Worked on ascending and descending stairs with narrow based quad cane and one railing which she states she has on the Rt side ascending her steps at home. Patient has difficulty with proper technique for exercises requiring repeated instruction and cueing. Encouraged patient to practice stairs at her home with someone with her for several reps to be sure she is safe and confident with stairs before trying them independently.    Rehab Potential Good    PT Frequency 3x / week    PT Duration 12 weeks    PT Treatment/Interventions ADLs/Self Care Home Management;Aquatic Therapy;Cryotherapy;Electrical Stimulation;Iontophoresis 4mg /ml Dexamethasone;Moist Heat;Ultrasound;Functional mobility training;Gait training;Stair training;Therapeutic activities;Therapeutic exercise;Balance training;Neuromuscular re-education;Patient/family education;Manual techniques;Passive range of motion;Dry needling;Taping    PT Next Visit Plan review HEP; progress with shoulder and bilat anle rehab - deep tissue work vs DN for shoulder girdle(not discussed), PROM and stretching Rt shoulder; ROM, stretching, strengthening, gait training, stairs, progressing rehab as tolerated and per protocol; modalities as indicated    PT Home Exercise Plan    Consulted and Agree with Plan of Care Patient             Patient will benefit from skilled therapeutic intervention in order to improve the following deficits and impairments:     Visit Diagnosis: Acute pain of left shoulder  Scapular dyskinesis  Muscle weakness  (generalized)  Pain in left ankle and joints of left foot  Other abnormalities of gait and mobility  Pain in right ankle and joints of right foot     Problem List Patient Active Problem List   Diagnosis Date Noted   Pressure injury of skin of sacral region 08/10/2021   Acute cystitis without hematuria 08/10/2021   Acute blood loss anemia    History of hypertension    Drug induced constipation    Postoperative pain    Critical polytrauma 07/27/2021   Humerus fracture 07/21/2021   Tibial plateau fracture, left 07/21/2021   Nondisplaced fracture of fifth metatarsal bone, right foot, initial encounter for closed fracture 07/21/2021   Nondisplaced fracture of lateral malleolus of right fibula, initial encounter for closed fracture 07/21/2021   Left trimalleolar fracture, closed, initial encounter 07/21/2021   Fall 07/20/2021   Urine frequency 04/29/2019   Chest pain 04/29/2019   Urinary frequency 04/29/2019   Post-menopausal 04/29/2019   Allergic rhinitis 10/13/2018   Mild intermittent asthma with acute exacerbation 10/13/2018   Gastroesophageal reflux disease 10/13/2018   Right hip pain 06/01/2018   Chronic low back pain 05/25/2018   Acute pain of right knee 05/25/2018   Thoracic aorta atherosclerosis (HCC) 05/25/2018   Generalized anxiety disorder 03/22/2017  Hyperlipidemia LDL goal <100 03/22/2017   Hypothyroidism 03/22/2017   Osteoporosis 12/20/2016   Encounter for counseling 09/18/2016   Morbid obesity (HCC) 02/06/2016   Vaginal dryness 02/06/2016   Allergic rhinitis due to pollen 02/01/2016   Acute maxillary sinusitis 02/01/2016   Essential hypertension 02/01/2016   Asthma with acute exacerbation 02/01/2016   Lumbar radiculopathy, acute 11/01/2015   Osteoporosis, 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

## 2021-10-25 ENCOUNTER — Ambulatory Visit: Payer: BC Managed Care – PPO | Admitting: Physical Therapy

## 2021-10-25 ENCOUNTER — Encounter: Payer: BC Managed Care – PPO | Admitting: Physical Therapy

## 2021-10-25 DIAGNOSIS — Z09 Encounter for follow-up examination after completed treatment for conditions other than malignant neoplasm: Secondary | ICD-10-CM | POA: Diagnosis not present

## 2021-10-25 DIAGNOSIS — M25571 Pain in right ankle and joints of right foot: Secondary | ICD-10-CM

## 2021-10-25 DIAGNOSIS — G2589 Other specified extrapyramidal and movement disorders: Secondary | ICD-10-CM | POA: Diagnosis not present

## 2021-10-25 DIAGNOSIS — M25512 Pain in left shoulder: Secondary | ICD-10-CM

## 2021-10-25 DIAGNOSIS — R2689 Other abnormalities of gait and mobility: Secondary | ICD-10-CM | POA: Diagnosis not present

## 2021-10-25 DIAGNOSIS — M6281 Muscle weakness (generalized): Secondary | ICD-10-CM | POA: Diagnosis not present

## 2021-10-25 DIAGNOSIS — M25572 Pain in left ankle and joints of left foot: Secondary | ICD-10-CM | POA: Diagnosis not present

## 2021-10-25 NOTE — Therapy (Signed)
[facility] [place] [address] [place], [place], [address] Phone: [phone]   Fax:  [phone]  Physical Therapy Treatment  Patient Details  Name: Kathryn Boyer MRN: [id number] Date of Birth: [birth year] Referring Provider (PT): Dr Myrene Galas; Dr Da Everardo Pacific   Encounter Date: 10/25/2021   PT End of Session - 10/25/21 1012     Visit Number 7    Number of Visits 30    Date for PT Re-Evaluation 01/02/22    Authorization Type BCBS    PT Start Time 1012    PT Stop Time 1100    PT Time Calculation (min) 48 min    Activity Tolerance Patient tolerated treatment well    Behavior During Therapy [facility] for tasks assessed/performed             Past Medical History:  Diagnosis Date   Anxiety    Asthma    Hyperlipidemia    Hypertension    Plantar fasciitis    Thyroid disease     Past Surgical History:  Procedure Laterality Date   ABDOMINAL HYSTERECTOMY     BREAST SURGERY     NOSE SURGERY     ORIF ANKLE FRACTURE Left 07/23/2021   Procedure: OPEN REDUCTION INTERNAL FIXATION (ORIF) ANKLE FRACTURE;  Surgeon: Myrene Galas, MD;  [facility]: [facility];  Service: Orthopedics;  Laterality: Left;   REVERSE SHOULDER ARTHROPLASTY Left 07/25/2021   Procedure: LEFT REVERSE SHOULDER ARTHROPLASTY;  Surgeon: Bjorn Pippin, MD;  Location: [facility] OR;  Service: Orthopedics;  Laterality: Left;    There were no vitals filed for this visit.   Subjective Assessment - 10/25/21 1017     Subjective Pt states she was very fatigued after last session. Pt notes continued difficulties with sleep. Pt reports continued knee pain.    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 Yes    Pain Score 3     Pain Location Ankle    Pain Orientation Left    Pain Descriptors / Indicators Aching    Pain Type Acute pain    Pain Score 3     Pain Location Shoulder    Pain Orientation Left                               [facility] Adult PT Treatment/Exercise - 10/25/21 0001       Ambulation/Gait   Ambulation/Gait Assistance 4: Min guard    Ambulation Distance (Feet) 160 Feet    Assistive device Straight cane    Gait Pattern Antalgic;Left flexed knee in stance;Lateral hip instability;Decreased weight shift to left    Gait Comments cues to keep knee and quad contracted during heel strike through stance phase      Knee/Hip Exercises: Prone   Hamstring Curl 2 sets;10 reps    Hamstring Curl Limitations red tband    Hip Extension Strengthening;2 sets;10 reps      Manual Therapy   Soft tissue mobilization STM & TPR L hamstring      Ankle Exercises: Supine   Other Supine Ankle Exercises SLR red tband 2x10    Other Supine Ankle Exercises SAQ red tband 2x10      Ankle Exercises: Sidelying   Other Sidelying Ankle Exercises Clamshell red tband 2x10      Ankle  Exercises: Aerobic   Nustep L5 x 5 min UEs/LEs      Ankle Exercises: Seated   Other Seated Ankle Exercises green tband 4 way ankle 2x10                       PT Short Term Goals - 10/10/21 1300       PT SHORT TERM GOAL #1   Title Improve posture and alignment with patient to deonstrate improved upright posture with posterior shoudler girdle engaged therefore improving shoulder mobilty and function    Time 6    Period Weeks    Status New    Target Date 11/21/21      PT SHORT TERM GOAL #2   Title Increase AROM Lt shoulder to 100-110 degress flexion and scaption    Time 6    Period Weeks    Status New    Target Date 11/21/21      PT SHORT TERM GOAL #3   Title Increase AROM Lt ankle to 5 deg ankle DF and 30 deg PF    Time 6    Period Weeks    Status New    Target Date 11/21/21      PT SHORT TERM GOAL #4   Title Patient to demonstrate improved gait pattern with least assistive device for household distances of 100-120 ft     Time 6    Period Weeks    Status New    Target Date 11/21/21      PT SHORT TERM GOAL #5   Title Patient to negotiate 4 steps to enter and exit home with assistive device as needed (dies not have railing either side of steps)    Time 6    Period Weeks    Status New    Target Date 11/21/21               PT Long Term Goals - 10/10/21 1304       PT LONG TERM GOAL #1   Title Functional AROM Lt shoulder allowing patient to return to normal functional activities    Time 12    Period Weeks    Status New    Target Date 01/02/22      PT LONG TERM GOAL #2   Title 4+/5 to 5/5 strength Lt shoulder and bilat ankles    Time 12    Period Weeks    Status New    Target Date 01/02/22      PT LONG TERM GOAL #3   Title Ambulation community distances of several hundred yards without assistive device    Time 12    Period Weeks    Status New    Target Date 01/02/22      PT LONG TERM GOAL #4   Title Independent in HEP(including aquatic program as indicated)    Time 12    Period Weeks    Status New    Target Date 01/02/22      PT LONG TERM GOAL #5   Title Improve functional limitation score to 58    Time 12    Period Weeks    Status New    Target Date 01/02/22                   Plan - 10/25/21 1108     Clinical Impression Statement Treatment session focused on improving L LE and ankle strength. L>R hip extensor weakness noted with increased hamstring tightness. Discussed  possible TPDN next session to hamstring as this might be one of the sources of her knee pain. L knee popping also noted with SAQ. Continued to work on gait with SPC. Pt remains apprehensive due to fear that her knee will buckle. Reviewed pt's resistance band exercises for her shoulder.    Personal Factors and Comorbidities Age;Fitness;Past/Current Experience;Comorbidity 1;Comorbidity 2    Comorbidities hypertension; osteoporesis    Examination-Activity Limitations Locomotion Level;Transfers;Reach  Overhead;Bend;Sit;Carry;Sleep;Squat;Dressing;Stairs;Stand;Lift;Toileting    Examination-Participation Restrictions Church;Cleaning;Community Activity;Driving;Laundry;Occupation;Meal Prep;Shop;Volunteer    Rehab Potential Good    PT Frequency 3x / week    PT Duration 12 weeks    PT Treatment/Interventions ADLs/Self Care Home Management;Aquatic Therapy;Cryotherapy;Electrical Stimulation;Iontophoresis 4mg /ml Dexamethasone;Moist Heat;Ultrasound;Functional mobility training;Gait training;Stair training;Therapeutic activities;Therapeutic exercise;Balance training;Neuromuscular re-education;Patient/family education;Manual techniques;Passive range of motion;Dry needling;Taping    PT Next Visit Plan review HEP; progress with shoulder and bilat anle rehab - deep tissue work vs DN for shoulder girdle(not discussed), PROM and stretching Rt shoulder; ROM, stretching, strengthening, gait training, stairs, progressing rehab as tolerated and per protocol; modalities as indicated    PT Home Exercise Plan [id number]    Consulted and Agree with Plan of Care Patient             Patient will benefit from skilled therapeutic intervention in order to improve the following deficits and impairments:  Abnormal gait, Decreased range of motion, Increased fascial restricitons, Decreased activity tolerance, Pain, Decreased balance, Impaired flexibility, Improper body mechanics, Decreased mobility, Decreased strength, Postural dysfunction  Visit Diagnosis: Acute pain of left shoulder  Scapular dyskinesis  Muscle weakness (generalized)  Pain in left ankle and joints of left foot  Other abnormalities of gait and mobility  Pain in right ankle and joints of right foot     Problem List Patient Active Problem List   Diagnosis Date Noted   Pressure injury of skin of sacral region 08/10/2021   Acute cystitis without hematuria 08/10/2021   Acute blood loss anemia    History of hypertension    Drug induced  constipation    Postoperative pain    Critical polytrauma 07/27/2021   Humerus fracture 07/21/2021   Tibial plateau fracture, left 07/21/2021   Nondisplaced fracture of fifth metatarsal bone, right foot, initial encounter for closed fracture 07/21/2021   Nondisplaced fracture of lateral malleolus of right fibula, initial encounter for closed fracture 07/21/2021   Left trimalleolar fracture, closed, initial encounter 07/21/2021   Fall 07/20/2021   Urine frequency 04/29/2019   Chest pain 04/29/2019   Urinary frequency 04/29/2019   Post-menopausal 04/29/2019   Allergic rhinitis 10/13/2018   Mild intermittent asthma with acute exacerbation 10/13/2018   Gastroesophageal reflux disease 10/13/2018   Right hip pain 06/01/2018   Chronic low back pain 05/25/2018   Acute pain of right knee 05/25/2018   Thoracic aorta atherosclerosis (HCC) 05/25/2018   Generalized anxiety disorder 03/22/2017   Hyperlipidemia LDL goal <100 03/22/2017   Hypothyroidism 03/22/2017   Osteoporosis 12/20/2016   Encounter for counseling 09/18/2016   Morbid obesity (HCC) 02/06/2016   Vaginal dryness 02/06/2016   Allergic rhinitis due to pollen 02/01/2016   Acute maxillary sinusitis 02/01/2016   Essential hypertension 02/01/2016   Asthma with acute exacerbation 02/01/2016   Lumbar radiculopathy, acute 11/01/2015   Osteoporosis, 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   Adjustment disorder with anxiety 02/08/2015   Abnormal weight gain 02/08/2015    [facility] April Dell PontoMa L Annalissa Murphey, PT, DPT 10/25/2021, 12:37 PM  [facility] [address] [place], [place], [address] Phone:  [phone]   Fax:  [phone]  Name: Kathryn Boyer MRN: [id number] Date of Birth: 14-Jan-1956

## 2021-10-26 ENCOUNTER — Encounter: Payer: Self-pay | Admitting: Family Medicine

## 2021-10-26 ENCOUNTER — Ambulatory Visit (INDEPENDENT_AMBULATORY_CARE_PROVIDER_SITE_OTHER): Payer: BC Managed Care – PPO | Admitting: Family Medicine

## 2021-10-26 VITALS — BP 110/78 | HR 74 | Temp 98.2°F | Resp 18 | Ht 67.0 in | Wt 159.8 lb

## 2021-10-26 DIAGNOSIS — I10 Essential (primary) hypertension: Secondary | ICD-10-CM | POA: Diagnosis not present

## 2021-10-26 DIAGNOSIS — M810 Age-related osteoporosis without current pathological fracture: Secondary | ICD-10-CM

## 2021-10-26 DIAGNOSIS — R300 Dysuria: Secondary | ICD-10-CM | POA: Diagnosis not present

## 2021-10-26 DIAGNOSIS — Z0000 Encounter for general adult medical examination without abnormal findings: Secondary | ICD-10-CM

## 2021-10-26 DIAGNOSIS — Z23 Encounter for immunization: Secondary | ICD-10-CM | POA: Diagnosis not present

## 2021-10-26 DIAGNOSIS — E785 Hyperlipidemia, unspecified: Secondary | ICD-10-CM | POA: Diagnosis not present

## 2021-10-26 DIAGNOSIS — E039 Hypothyroidism, unspecified: Secondary | ICD-10-CM | POA: Diagnosis not present

## 2021-10-26 DIAGNOSIS — N390 Urinary tract infection, site not specified: Secondary | ICD-10-CM | POA: Diagnosis not present

## 2021-10-26 DIAGNOSIS — S92354A Nondisplaced fracture of fifth metatarsal bone, right foot, initial encounter for closed fracture: Secondary | ICD-10-CM

## 2021-10-26 DIAGNOSIS — S8264XA Nondisplaced fracture of lateral malleolus of right fibula, initial encounter for closed fracture: Secondary | ICD-10-CM

## 2021-10-26 HISTORY — DX: Encounter for general adult medical examination without abnormal findings: Z00.00

## 2021-10-26 LAB — CBC WITH DIFFERENTIAL/PLATELET
Basophils Absolute: 0 10*3/uL (ref 0.0–0.1)
Basophils Relative: 0.8 % (ref 0.0–3.0)
Eosinophils Absolute: 0.2 10*3/uL (ref 0.0–0.7)
Eosinophils Relative: 3.3 % (ref 0.0–5.0)
HCT: 44.8 % (ref 36.0–46.0)
Hemoglobin: 14.8 g/dL (ref 12.0–15.0)
Lymphocytes Relative: 36.2 % (ref 12.0–46.0)
Lymphs Abs: 2.3 10*3/uL (ref 0.7–4.0)
MCHC: 33.1 g/dL (ref 30.0–36.0)
MCV: 88.2 fl (ref 78.0–100.0)
Monocytes Absolute: 0.4 10*3/uL (ref 0.1–1.0)
Monocytes Relative: 6.8 % (ref 3.0–12.0)
Neutro Abs: 3.3 10*3/uL (ref 1.4–7.7)
Neutrophils Relative %: 52.9 % (ref 43.0–77.0)
Platelets: 296 10*3/uL (ref 150.0–400.0)
RBC: 5.08 Mil/uL (ref 3.87–5.11)
RDW: 14.3 % (ref 11.5–15.5)
WBC: 6.3 10*3/uL (ref 4.0–10.5)

## 2021-10-26 LAB — COMPREHENSIVE METABOLIC PANEL
ALT: 14 U/L (ref 0–35)
AST: 18 U/L (ref 0–37)
Albumin: 4.6 g/dL (ref 3.5–5.2)
Alkaline Phosphatase: 97 U/L (ref 39–117)
BUN: 16 mg/dL (ref 6–23)
CO2: 28 mEq/L (ref 19–32)
Calcium: 9.8 mg/dL (ref 8.4–10.5)
Chloride: 102 mEq/L (ref 96–112)
Creatinine, Ser: 0.77 mg/dL (ref 0.40–1.20)
GFR: 80.32 mL/min (ref >60.00)
Glucose, Bld: 93 mg/dL (ref 70–99)
Potassium: 4.1 mEq/L (ref 3.5–5.1)
Sodium: 138 mEq/L (ref 135–145)
Total Bilirubin: 0.6 mg/dL (ref 0.2–1.2)
Total Protein: 7.2 g/dL (ref 6.0–8.3)

## 2021-10-26 LAB — POC URINALSYSI DIPSTICK (AUTOMATED)
Blood, UA: NEGATIVE
Glucose, UA: NEGATIVE
Ketones, UA: NEGATIVE
Leukocytes, UA: NEGATIVE
Nitrite, UA: NEGATIVE
Protein, UA: NEGATIVE
Spec Grav, UA: 1.025 (ref 1.010–1.025)
Urobilinogen, UA: 0.2 E.U./dL
pH, UA: 5 (ref 5.0–8.0)

## 2021-10-26 LAB — LIPID PANEL
Cholesterol: 171 mg/dL (ref 0–200)
HDL: 46.8 mg/dL (ref >39.00)
LDL Cholesterol: 99 mg/dL (ref 0–99)
NonHDL: 124.08
Total CHOL/HDL Ratio: 4
Triglycerides: 126 mg/dL (ref 0.0–149.0)
VLDL: 25.2 mg/dL (ref 0.0–40.0)

## 2021-10-26 LAB — TSH: TSH: 0.11 u[IU]/mL — ABNORMAL LOW (ref 0.35–5.50)

## 2021-10-26 NOTE — Progress Notes (Signed)
Subjective:   By signing my name below, I, Zite Okoli, attest that this documentation has been prepared under the direction and in the presence of Donato SchultzYvonne R Lowne Chase, DO. 10/26/2021    Patient ID: Kathryn Boyer, female    DOB: 1956/01/17, 66 y.o.   MRN: [id number]  Chief Complaint  Patient presents with   Annual Exam    Pt states fasting     HPI Patient is in today for a comprehensive physical exam.  She reports she is having problems with her left knee and it was noticed at physical therapy. She adds she is healing properly from her recent surgeries and is using a walker right now. She is still undergoing physical and occupational therapy. She will be receiving an injection to manage her osteoporosis. She has lost some weight since her surgeries. Wt Readings from Last 3 Encounters:  10/26/21 159 lb 12.8 oz (72.5 kg)  07/27/21 191 lb 9.3 oz (86.9 kg)  07/25/21 177 lb 14.6 oz (80.7 kg)    She is also scared and wary of falling again.  She reports that since her surgeries, she has been having recent bouts of UTI and diarrhea. She adds Macrobid normally works to resolve previous episodes of UTI. She went to a urologist and was prescribed estrogen cream and a 3 month course of antibiotics. Her current symptoms include dysuria. She does not have diarrhea at this time.  She denies fever, hearing loss, ear pain,congestion, sinus pain, sore throat, eye pain, chest pain, palpitations, cough, shortness of breath, wheezing, nausea. vomiting, diarrhea, constipation, blood in stool,frequency, hematuria and headaches.   She will receive the flu and pneumonia vaccine today. She has 3 Covid-19 vaccines at this time and will schedule the booster vaccine for later.    Past Medical History:  Diagnosis Date   Anxiety    Asthma    Hyperlipidemia    Hypertension    Plantar fasciitis    Thyroid disease     Past Surgical History:  Procedure Laterality Date   ABDOMINAL HYSTERECTOMY     BREAST  SURGERY     NOSE SURGERY     ORIF ANKLE FRACTURE Left 07/23/2021   Procedure: OPEN REDUCTION INTERNAL FIXATION (ORIF) ANKLE FRACTURE;  Surgeon: Myrene GalasHandy, Michael, MD;  Location: [facility] OR;  Service: Orthopedics;  Laterality: Left;   REVERSE SHOULDER ARTHROPLASTY Left 07/25/2021   Procedure: LEFT REVERSE SHOULDER ARTHROPLASTY;  Surgeon: Bjorn PippinVarkey, Dax T, MD;  Location: [facility] OR;  Service: Orthopedics;  Laterality: Left;    Family History  Problem Relation Age of Onset   Heart disease Mother    Stroke Mother    Hypertension Mother    Cancer Mother        ovarian   Aneurysm Father    Hypertension Father    Heart disease Father    Kidney disease Father    Heart disease Sister     Social History   Socioeconomic History   Marital status: Married    Spouse name: Not on file   Number of children: Not on file   Years of education: Not on file   Highest education level: Not on file  Occupational History   Not on file  Tobacco Use   Smoking status: Never   Smokeless tobacco: Never  Vaping Use   Vaping Use: Never used  Substance and Sexual Activity   Alcohol use: No   Drug use: No   Sexual activity: Not on file  Other Topics Concern  Not on file  Social History Narrative   Exercise-sometimes   Social Determinants of Health   Financial Resource Strain: Not on file  Food Insecurity: Not on file  Transportation Needs: Not on file  Physical Activity: Not on file  Stress: Not on file  Social Connections: Not on file  Intimate Partner Violence: Not on file    Outpatient Medications Prior to Visit  Medication Sig Dispense Refill   ALPRAZolam (XANAX) 0.5 MG tablet Take 1 tablet (0.5 mg total) by mouth 3 (three) times daily as needed for anxiety. 30 tablet 0   cephALEXin (KEFLEX) 500 MG capsule Take 1 capsule (500 mg total) by mouth 2 (two) times daily. 10 capsule 0   estradiol (ESTRACE) 0.1 MG/GM vaginal cream Place 1 Applicatorful vaginally 3 (three) times a week. 42.5 g 3   estradiol  (VIVELLE-DOT) 0.025 MG/24HR Place 1 patch onto the skin 2 (two) times a week. 24 patch 5   famotidine (PEPCID) 20 MG tablet Take 1 tablet (20 mg total) by mouth 2 (two) times daily. 180 tablet 1   flunisolide (NASALIDE) 25 MCG/ACT (0.025%) SOLN Place 2 sprays into the nose 2 (two) times daily. 1 Bottle 5   fluticasone (FLOVENT HFA) 110 MCG/ACT inhaler Inhale 2 puffs into the lungs 2 (two) times daily. 1 Inhaler 5   gabapentin (NEURONTIN) 100 MG capsule Take 1 capsule (100 mg total) by mouth 3 (three) times daily. 90 capsule 0   levothyroxine (SYNTHROID) 100 MCG tablet TAKE 1 TABLET BY MOUTH EVERY DAY. 90 tablet 1   methocarbamol (ROBAXIN) 750 MG tablet Take 1 tablet (750 mg total) by mouth 3 (three) times daily. 90 tablet 0   montelukast (SINGULAIR) 10 MG tablet Take 1 tablet (10 mg total) by mouth at bedtime. 30 tablet 5   naproxen (NAPROSYN) 250 MG tablet Take 1 tablet (250 mg total) by mouth 2 (two) times daily with a meal. 60 tablet 0   olmesartan (BENICAR) 40 MG tablet TAKE 1 TABLET(40 MG) BY MOUTH DAILY 90 tablet 1   oxybutynin (DITROPAN-XL) 5 MG 24 hr tablet Take 1 tablet (5 mg total) by mouth at bedtime. 30 tablet 2   oxyCODONE (OXY IR/ROXICODONE) 5 MG immediate release tablet Take 1-2 tablets (5-10 mg total) by mouth every 4 (four) hours as needed for moderate pain (pain score 4-6). 30 tablet 0   polyethylene glycol (MIRALAX / GLYCOLAX) 17 g packet Take 17 g by mouth 2 (two) times daily. 14 each 0   pravastatin (PRAVACHOL) 20 MG tablet TAKE 1 TABLET(20 MG) BY MOUTH DAILY (Patient taking differently: Take 20 mg by mouth daily.) 90 tablet 1   rivaroxaban (XARELTO) 10 MG TABS tablet Take 1 tablet (10 mg total) by mouth daily. 24 tablet 0   senna-docusate (SENOKOT-S) 8.6-50 MG tablet Take 2 tablets by mouth 2 (two) times daily.     VENTOLIN HFA 108 (90 Base) MCG/ACT inhaler Inhale 2 puffs into the lungs every 4 (four) hours as needed for wheezing or shortness of breath. 18 g 1   Vitamin D,  Ergocalciferol, (DRISDOL) 1.25 MG (50000 UNIT) CAPS capsule Take 1 capsule (50,000 Units total) by mouth every 7 (seven) days. 12 capsule 1   acetaminophen (TYLENOL) 325 MG tablet Take 1-2 tablets (325-650 mg total) by mouth every 6 (six) hours as needed for mild pain (pain score 1-3 or temp > 100.5). (Patient not taking: Reported on 10/10/2021)     ascorbic acid (VITAMIN C) 500 MG tablet Take 1 tablet (500  mg total) by mouth daily. (Patient not taking: Reported on 10/10/2021) 30 tablet 0   calcium citrate (CALCITRATE - DOSED IN MG ELEMENTAL CALCIUM) 950 (200 Ca) MG tablet Take 1 tablet (200 mg of elemental calcium total) by mouth 2 (two) times daily before lunch and supper. (Patient not taking: Reported on 10/10/2021) 30 tablet 0   cholecalciferol (VITAMIN D) 25 MCG tablet Take 2 tablets (2,000 Units total) by mouth 2 (two) times daily. (Patient not taking: Reported on 10/10/2021) 60 tablet 0   doxycycline (VIBRAMYCIN) 100 MG capsule Take 1 capsule (100 mg total) by mouth 2 (two) times daily. (Patient not taking: Reported on 10/26/2021) 20 capsule 0   nitrofurantoin, macrocrystal-monohydrate, (MACROBID) 100 MG capsule Take 1 capsule (100 mg total) by mouth 2 (two) times daily. (Patient not taking: Reported on 10/26/2021) 14 capsule 0   No facility-administered medications prior to visit.    Allergies  Allergen Reactions   Contrast Media [Iodinated Contrast Media] Hives   Isovue [Iopamidol] Hives    After returning home pt called to inform technologist of rash to face and neck, denies difficulty breathing or swallowing, pt took 2 benedryl Will need premeds in future   Amoxicillin Other (See Comments)    Headache  Cephalosporin tolerant 07/23/2021   Belviq Xr [Lorcaserin Hcl Er] Itching    Caused her face to turn red.   Saxenda [Liraglutide -Weight Management] Other (See Comments)    Caused indigestion.   Penicillins Rash    Cephalosporin Tolerant 07/23/2021     Review of Systems   Constitutional:  Negative for fever.  HENT:  Negative for congestion, ear pain, hearing loss, sinus pain and sore throat.   Eyes:  Negative for blurred vision and pain.  Respiratory:  Negative for cough, sputum production, shortness of breath and wheezing.   Cardiovascular:  Negative for chest pain and palpitations.  Gastrointestinal:  Negative for blood in stool, constipation, diarrhea, nausea and vomiting.  Genitourinary:  Positive for dysuria. Negative for frequency, hematuria and urgency.  Musculoskeletal:  Negative for back pain, falls and myalgias.  Neurological:  Negative for dizziness, sensory change, loss of consciousness, weakness and headaches.  Endo/Heme/Allergies:  Negative for environmental allergies. Does not bruise/bleed easily.  Psychiatric/Behavioral:  Negative for depression and suicidal ideas. The patient is not nervous/anxious and does not have insomnia.       Objective:    Physical Exam Constitutional:      General: She is not in acute distress.    Appearance: Normal appearance. She is not ill-appearing.  HENT:     Head: Normocephalic and atraumatic.     Right Ear: Tympanic membrane, ear canal and external ear normal.     Left Ear: Tympanic membrane, ear canal and external ear normal.  Eyes:     Extraocular Movements: Extraocular movements intact.     Pupils: Pupils are equal, round, and reactive to light.  Cardiovascular:     Rate and Rhythm: Normal rate and regular rhythm.     Pulses: Normal pulses.     Heart sounds: Normal heart sounds. No murmur heard.   No gallop.  Pulmonary:     Effort: Pulmonary effort is normal. No respiratory distress.     Breath sounds: Normal breath sounds. No wheezing, rhonchi or rales.  Abdominal:     General: Bowel sounds are normal. There is no distension.     Palpations: Abdomen is soft. There is no mass.     Tenderness: There is no abdominal tenderness. There is  no guarding or rebound.     Hernia: No hernia is present.   Musculoskeletal:     Cervical back: Normal range of motion and neck supple.  Lymphadenopathy:     Cervical: No cervical adenopathy.  Skin:    General: Skin is warm and dry.  Neurological:     Mental Status: She is alert and oriented to person, place, and time.  Psychiatric:        Behavior: Behavior normal.    BP 110/78 (BP Location: Right Arm, Patient Position: Sitting, Cuff Size: Normal)    Pulse 74    Temp 98.2 F (36.8 C) (Oral)    Resp 18    Ht 5\' 7"  (1.702 m)    Wt 159 lb 12.8 oz (72.5 kg)    SpO2 97%    BMI 25.03 kg/m  Wt Readings from Last 3 Encounters:  10/26/21 159 lb 12.8 oz (72.5 kg)  07/27/21 191 lb 9.3 oz (86.9 kg)  07/25/21 177 lb 14.6 oz (80.7 kg)    Diabetic Foot Exam - Simple   No data filed    Lab Results  Component Value Date   WBC 8.6 07/31/2021   HGB 10.2 (L) 07/31/2021   HCT 30.8 (L) 07/31/2021   PLT 375 07/31/2021   GLUCOSE 104 (H) 07/26/2021   CHOL 184 04/19/2021   TRIG 109.0 04/19/2021   HDL 52.30 04/19/2021   LDLCALC 109 (H) 04/19/2021   ALT 19 07/24/2021   AST 26 07/24/2021   NA 139 07/26/2021   K 3.7 07/26/2021   CL 103 07/26/2021   CREATININE 0.92 07/27/2021   BUN 11 07/26/2021   CO2 27 07/26/2021   TSH 0.400 07/24/2021    Lab Results  Component Value Date   TSH 0.400 07/24/2021   Lab Results  Component Value Date   WBC 8.6 07/31/2021   HGB 10.2 (L) 07/31/2021   HCT 30.8 (L) 07/31/2021   MCV 93.6 07/31/2021   PLT 375 07/31/2021   Lab Results  Component Value Date   NA 139 07/26/2021   K 3.7 07/26/2021   CO2 27 07/26/2021   GLUCOSE 104 (H) 07/26/2021   BUN 11 07/26/2021   CREATININE 0.92 07/27/2021   BILITOT 0.8 07/24/2021   ALKPHOS 63 07/24/2021   AST 26 07/24/2021   ALT 19 07/24/2021   PROT 5.9 (L) 07/24/2021   ALBUMIN 3.0 (L) 07/24/2021   CALCIUM 8.6 (L) 07/26/2021   ANIONGAP 9 07/26/2021   GFR 60.36 04/19/2021   Lab Results  Component Value Date   CHOL 184 04/19/2021   Lab Results  Component Value  Date   HDL 52.30 04/19/2021   Lab Results  Component Value Date   LDLCALC 109 (H) 04/19/2021   Lab Results  Component Value Date   TRIG 109.0 04/19/2021   Lab Results  Component Value Date   CHOLHDL 4 04/19/2021   No results found for: HGBA1C     Colonoscopy- Last completed on 06/06/2015. Repeat in 10 years.  Mammogram- Last checked on 03/05/2021. Results were normal. Repeat in 1 year Dexa- Last checked on 09/11/2021. Patient was considered osteoporotic according to the [employer]. Repeat in 2 years.  Assessment & Plan:   Problem List Items Addressed This Visit       Unprioritized   Essential hypertension    Well controlled, no changes to meds. Encouraged heart healthy diet such as the DASH diet and exercise as tolerated.       Hyperlipidemia  Encourage heart healthy diet such as MIND or DASH diet, increase exercise, avoid trans fats, simple carbohydrates and processed foods, consider a krill or fish or flaxseed oil cap daily.       Relevant Orders   Lipid panel   Hypothyroidism    Check labs  con't synthroid      Relevant Orders   TSH   Nondisplaced fracture of fifth metatarsal bone, right foot, initial encounter for closed fracture    Per trauma surgeon Healing well      Nondisplaced fracture of lateral malleolus of right fibula, initial encounter for closed fracture    Per ortho Healing well      Osteoporosis    Will start evenity soon      Preventative health care - Primary    ghm utd Check labs        Other Visit Diagnoses     Recurrent UTI       Relevant Orders   POCT Urinalysis Dipstick (Automated)   Dysuria       Relevant Orders   POCT Urinalysis Dipstick (Automated)   Primary hypertension       Relevant Orders   CBC with Differential/Platelet   Comprehensive metabolic panel   Lipid panel   Need for influenza vaccination       Relevant Orders   Flu Vaccine QUAD High Dose(Fluad) (Completed)   Need for  pneumococcal vaccination       Relevant Orders   Pneumococcal conjugate vaccine 20-valent (Prevnar 20) (Completed)       No orders of the defined types were placed in this encounter.   I,Zite Okoli,acting as a Neurosurgeonscribe for Fisher ScientificYvonne R Lowne Chase, DO.,have documented all relevant documentation on the behalf of Donato SchultzYvonne R Lowne Chase, DO,as directed by  Donato SchultzYvonne R Lowne Chase, DO while in the presence of Donato SchultzYvonne R Lowne Chase, DO.   I, Donato SchultzYvonne R Lowne Chase, DO., personally preformed the services described in this documentation.  All medical record entries made by the scribe were at my direction and in my presence.  I have reviewed the chart and discharge instructions (if applicable) and agree that the record reflects my personal performance and is accurate and complete. 10/26/2021

## 2021-10-26 NOTE — Assessment & Plan Note (Signed)
Per trauma surgeon ?Healing well ?

## 2021-10-26 NOTE — Assessment & Plan Note (Signed)
Will start evenity soon ?

## 2021-10-26 NOTE — Assessment & Plan Note (Signed)
Check labs con't synthroid 

## 2021-10-26 NOTE — Assessment & Plan Note (Signed)
Per ortho ?Healing well ?

## 2021-10-26 NOTE — Assessment & Plan Note (Signed)
Well controlled, no changes to meds. Encouraged heart healthy diet such as the DASH diet and exercise as tolerated.  °

## 2021-10-26 NOTE — Patient Instructions (Signed)
Preventive Care 34 Years and Older, Female ?Preventive care refers to lifestyle choices and visits with your health care provider that can promote health and wellness. Preventive care visits are also called wellness exams. ?What can I expect for my preventive care visit? ?Counseling ?Your health care provider may ask you questions about your: ?Medical history, including: ?Past medical problems. ?Family medical history. ?Pregnancy and menstrual history. ?History of falls. ?Current health, including: ?Memory and ability to understand (cognition). ?Emotional well-being. ?Home life and relationship well-being. ?Sexual activity and sexual health. ?Lifestyle, including: ?Alcohol, nicotine or tobacco, and drug use. ?Access to firearms. ?Diet, exercise, and sleep habits. ?Work and work Statistician. ?Sunscreen use. ?Safety issues such as seatbelt and bike helmet use. ?Physical exam ?Your health care provider will check your: ?Height and weight. These may be used to calculate your BMI (body mass index). BMI is a measurement that tells if you are at a healthy weight. ?Waist circumference. This measures the distance around your waistline. This measurement also tells if you are at a healthy weight and may help predict your risk of certain diseases, such as type 2 diabetes and high blood pressure. ?Heart rate and blood pressure. ?Body temperature. ?Skin for abnormal spots. ?What immunizations do I need? ?Vaccines are usually given at various ages, according to a schedule. Your health care provider will recommend vaccines for you based on your age, medical history, and lifestyle or other factors, such as travel or where you work. ?What tests do I need? ?Screening ?Your health care provider may recommend screening tests for certain conditions. This may include: ?Lipid and cholesterol levels. ?Hepatitis C test. ?Hepatitis B test. ?HIV (human immunodeficiency virus) test. ?STI (sexually transmitted infection) testing, if you are at  risk. ?Lung cancer screening. ?Colorectal cancer screening. ?Diabetes screening. This is done by checking your blood sugar (glucose) after you have not eaten for a while (fasting). ?Mammogram. Talk with your health care provider about how often you should have regular mammograms. ?BRCA-related cancer screening. This may be done if you have a family history of breast, ovarian, tubal, or peritoneal cancers. ?Bone density scan. This is done to screen for osteoporosis. ?Talk with your health care provider about your test results, treatment options, and if necessary, the need for more tests. ?Follow these instructions at home: ?Eating and drinking ? ?Eat a diet that includes fresh fruits and vegetables, whole grains, lean protein, and low-fat dairy products. Limit your intake of foods with high amounts of sugar, saturated fats, and salt. ?Take vitamin and mineral supplements as recommended by your health care provider. ?Do not drink alcohol if your health care provider tells you not to drink. ?If you drink alcohol: ?Limit how much you have to 0-1 drink a day. ?Know how much alcohol is in your drink. In the [place] one drink equals one 12 oz bottle of beer (355 mL), one 5 oz glass of wine (148 mL), or one 1? oz glass of hard liquor (44 mL). ?Lifestyle ?Brush your teeth every morning and night with fluoride toothpaste. Floss one time each day. ?Exercise for at least 30 minutes 5 or more days each week. ?Do not use any products that contain nicotine or tobacco. These products include cigarettes, chewing tobacco, and vaping devices, such as e-cigarettes. If you need help quitting, ask your health care provider. ?Do not use drugs. ?If you are sexually active, practice safe sex. Use a condom or other form of protection in order to prevent STIs. ?Take aspirin only as told by your  health care provider. Make sure that you understand how much to take and what form to take. Work with your health care provider to find out whether it  is safe and beneficial for you to take aspirin daily. ?Ask your health care provider if you need to take a cholesterol-lowering medicine (statin). ?Find healthy ways to manage stress, such as: ?Meditation, yoga, or listening to music. ?Journaling. ?Talking to a trusted person. ?Spending time with friends and family. ?Minimize exposure to UV radiation to reduce your risk of skin cancer. ?Safety ?Always wear your seat belt while driving or riding in a vehicle. ?Do not drive: ?If you have been drinking alcohol. Do not ride with someone who has been drinking. ?When you are tired or distracted. ?While texting. ?If you have been using any mind-altering substances or drugs. ?Wear a helmet and other protective equipment during sports activities. ?If you have firearms in your house, make sure you follow all gun safety procedures. ?What's next? ?Visit your health care provider once a year for an annual wellness visit. ?Ask your health care provider how often you should have your eyes and teeth checked. ?Stay up to date on all vaccines. ?This information is not intended to replace advice given to you by your health care provider. Make sure you discuss any questions you have with your health care provider. ?Document Revised: 02/07/2021 Document Reviewed: 02/07/2021 ?Elsevier Patient Education ? [place]. ? ?

## 2021-10-26 NOTE — Assessment & Plan Note (Signed)
ghm utd Check labs  

## 2021-10-26 NOTE — Assessment & Plan Note (Signed)
Encourage heart healthy diet such as MIND or DASH diet, increase exercise, avoid trans fats, simple carbohydrates and processed foods, consider a krill or fish or flaxseed oil cap daily.  °

## 2021-10-29 ENCOUNTER — Other Ambulatory Visit: Payer: Self-pay

## 2021-10-29 ENCOUNTER — Ambulatory Visit: Payer: BC Managed Care – PPO | Admitting: Rehabilitative and Restorative Service Providers"

## 2021-10-29 ENCOUNTER — Encounter: Payer: Self-pay | Admitting: Rehabilitative and Restorative Service Providers"

## 2021-10-29 DIAGNOSIS — M25512 Pain in left shoulder: Secondary | ICD-10-CM

## 2021-10-29 DIAGNOSIS — M25572 Pain in left ankle and joints of left foot: Secondary | ICD-10-CM

## 2021-10-29 DIAGNOSIS — Z09 Encounter for follow-up examination after completed treatment for conditions other than malignant neoplasm: Secondary | ICD-10-CM | POA: Diagnosis not present

## 2021-10-29 DIAGNOSIS — E039 Hypothyroidism, unspecified: Secondary | ICD-10-CM

## 2021-10-29 DIAGNOSIS — R2689 Other abnormalities of gait and mobility: Secondary | ICD-10-CM | POA: Diagnosis not present

## 2021-10-29 DIAGNOSIS — G2589 Other specified extrapyramidal and movement disorders: Secondary | ICD-10-CM | POA: Diagnosis not present

## 2021-10-29 DIAGNOSIS — M25571 Pain in right ankle and joints of right foot: Secondary | ICD-10-CM | POA: Diagnosis not present

## 2021-10-29 DIAGNOSIS — M6281 Muscle weakness (generalized): Secondary | ICD-10-CM | POA: Diagnosis not present

## 2021-10-29 MED ORDER — LEVOTHYROXINE SODIUM 88 MCG PO TABS: 100.0000 ug | ORAL_TABLET | Freq: Every day | ORAL | 1 refills | Status: DC

## 2021-10-29 NOTE — Therapy (Signed)
[facility] [place] [address] [place], [place], [address] Phone: [phone]   Fax:  [phone]  Physical Therapy Treatment  Patient Details  Name: Kathryn Boyer MRN: [id number] Date of Birth: 04/01/1956 Referring Provider (PT): Dr Myrene Galas; Dr Da Everardo Pacific   Encounter Date: 10/29/2021   PT End of Session - 10/29/21 1021     Visit Number 8    Number of Visits 30    Date for PT Re-Evaluation 01/02/22    Authorization Type BCBS    PT Start Time 1015    PT Stop Time 1103    PT Time Calculation (min) 48 min    Activity Tolerance Patient tolerated treatment well             Past Medical History:  Diagnosis Date   Anxiety    Asthma    Hyperlipidemia    Hypertension    Plantar fasciitis    Thyroid disease     Past Surgical History:  Procedure Laterality Date   ABDOMINAL HYSTERECTOMY     BREAST SURGERY     NOSE SURGERY     ORIF ANKLE FRACTURE Left 07/23/2021   Procedure: OPEN REDUCTION INTERNAL FIXATION (ORIF) ANKLE FRACTURE;  Surgeon: Myrene Galas, MD;  Location: [facility] OR;  Service: Orthopedics;  Laterality: Left;   REVERSE SHOULDER ARTHROPLASTY Left 07/25/2021   Procedure: LEFT REVERSE SHOULDER ARTHROPLASTY;  Surgeon: Bjorn Pippin, MD;  Location: [facility] OR;  Service: Orthopedics;  Laterality: Left;    There were no vitals filed for this visit.   Subjective Assessment - 10/29/21 1021     Subjective Patient states that she still can't get into her home without her walker. Continued Lt knee pain - awiating call back from MD office.    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 Yes    Pain Score 2     Pain Location Ankle    Pain Orientation Left    Pain Descriptors / Indicators Aching    Pain Type Acute pain    Pain Radiating Towards Lt knee - pain into the Lt leg from posterior  thigh to LE    Pain Frequency Intermittent    Pain Score 2    Pain Location Shoulder    Pain Orientation Left    Pain Descriptors / Indicators Aching                [facility] PT Assessment - 10/29/21 0001       Assessment   Medical Diagnosis Lt reverse TSA; Lt/Rt ankle fractures    Referring Provider (PT) Dr Myrene Galas; Dr Da Everardo Pacific    Onset Date/Surgical Date 07/20/21    Hand Dominance Right    Next MD Visit 10/23/21; 11/14/21    Prior Therapy in hospital; rehab; home health      PROM   Left Shoulder Extension 52 Degrees    Left Shoulder Flexion 148 Degrees    Left Shoulder ABduction 150 Degrees   in scapular plane   Left Shoulder External Rotation 41 Degrees   in scapular plane     Palpation   Palpation comment muscular tightness through the Lt pecs, anterior deltoid, biceps, upper trap, leveator, teres; Lt hamstring,calf      Ambulation/Gait   Stairs Yes    Stairs Assistance 5: Supervision    Stair Management  Technique Step to pattern;With cane;Forwards    Number of Stairs 5    Height of Stairs --   ascending and descending steps - practice and problem solving for entering house with cane/walker suggested leaving walker at the door and use the walker entering and exiting home then use cane                                     PT Short Term Goals - 10/10/21 1300       PT SHORT TERM GOAL #1   Title Improve posture and alignment with patient to deonstrate improved upright posture with posterior shoudler girdle engaged therefore improving shoulder mobilty and function    Time 6    Period Weeks    Status New    Target Date 11/21/21      PT SHORT TERM GOAL #2   Title Increase AROM Lt shoulder to 100-110 degress flexion and scaption    Time 6    Period Weeks    Status New    Target Date 11/21/21      PT SHORT TERM GOAL #3   Title Increase AROM Lt ankle to 5 deg ankle DF and 30 deg PF    Time 6    Period Weeks    Status New    Target  Date 11/21/21      PT SHORT TERM GOAL #4   Title Patient to demonstrate improved gait pattern with least assistive device for household distances of 100-120 ft    Time 6    Period Weeks    Status New    Target Date 11/21/21      PT SHORT TERM GOAL #5   Title Patient to negotiate 4 steps to enter and exit home with assistive device as needed (dies not have railing either side of steps)    Time 6    Period Weeks    Status New    Target Date 11/21/21               PT Long Term Goals - 10/10/21 1304       PT LONG TERM GOAL #1   Title Functional AROM Lt shoulder allowing patient to return to normal functional activities    Time 12    Period Weeks    Status New    Target Date 01/02/22      PT LONG TERM GOAL #2   Title 4+/5 to 5/5 strength Lt shoulder and bilat ankles    Time 12    Period Weeks    Status New    Target Date 01/02/22      PT LONG TERM GOAL #3   Title Ambulation community distances of several hundred yards without assistive device    Time 12    Period Weeks    Status New    Target Date 01/02/22      PT LONG TERM GOAL #4   Title Independent in HEP(including aquatic program as indicated)    Time 12    Period Weeks    Status New    Target Date 01/02/22      PT LONG TERM GOAL #5   Title Improve functional limitation score to 58    Time 12    Period Weeks    Status New    Target Date 01/02/22  Plan - 10/29/21 1023     Clinical Impression Statement Continued work on gait and stairs. Suggested that pt leave walker at front stairs and use walker to get in and out the step to the door. Reviewed and took video of Shoulder AAROM ER with cane for patient' HEP. Focus on shoulder today. Soft tissue work through [employer] shoulder girdle followed by PROM and AAROM exercises. Good gains in PROM Lt shoulder with treatment. Consider trial of DN to hamstring;possibly taping Lt knee at next visit.    Rehab Potential Good    PT Frequency 3x /  week    PT Duration 12 weeks    PT Treatment/Interventions ADLs/Self Care Home Management;Aquatic Therapy;Cryotherapy;Electrical Stimulation;Iontophoresis 4mg /ml Dexamethasone;Moist Heat;Ultrasound;Functional mobility training;Gait training;Stair training;Therapeutic activities;Therapeutic exercise;Balance training;Neuromuscular re-education;Patient/family education;Manual techniques;Passive range of motion;Dry needling;Taping    PT Next Visit Plan review HEP; progress with shoulder and bilat anle rehab - deep tissue work vs DN for shoulder girdle(not discussed), PROM and stretching Rt shoulder; ROM, stretching, strengthening, gait training, stairs, progressing rehab as tolerated and per protocol; modalities as indicated    PT Home Exercise Plan    Consulted and Agree with Plan of Care Patient             Patient will benefit from skilled therapeutic intervention in order to improve the following deficits and impairments:     Visit Diagnosis: Acute pain of left shoulder  Scapular dyskinesis  Muscle weakness (generalized)  Pain in left ankle and joints of left foot  Other abnormalities of gait and mobility  Pain in right ankle and joints of right foot     Problem List Patient Active Problem List   Diagnosis Date Noted   Preventative health care 10/26/2021   Pressure injury of skin of sacral region 08/10/2021   Acute cystitis without hematuria 08/10/2021   Acute blood loss anemia    History of hypertension    Drug induced constipation    Postoperative pain    Critical polytrauma 07/27/2021   Humerus fracture 07/21/2021   Tibial plateau fracture, left 07/21/2021   Nondisplaced fracture of fifth metatarsal bone, right foot, initial encounter for closed fracture 07/21/2021   Nondisplaced fracture of lateral malleolus of right fibula, initial encounter for closed fracture 07/21/2021   Left trimalleolar fracture, closed, initial encounter 07/21/2021   Fall 07/20/2021    Urine frequency 04/29/2019   Chest pain 04/29/2019   Urinary frequency 04/29/2019   Post-menopausal 04/29/2019   Allergic rhinitis 10/13/2018   Mild intermittent asthma with acute exacerbation 10/13/2018   Gastroesophageal reflux disease 10/13/2018   Right hip pain 06/01/2018   Chronic low back pain 05/25/2018   Acute pain of right knee 05/25/2018   Thoracic aorta atherosclerosis (HCC) 05/25/2018   Generalized anxiety disorder 03/22/2017   Hyperlipidemia 03/22/2017   Hypothyroidism 03/22/2017   Osteoporosis 12/20/2016   Encounter for counseling 09/18/2016   Morbid obesity (HCC) 02/06/2016   Vaginal dryness 02/06/2016   Allergic rhinitis due to pollen 02/01/2016   Acute maxillary sinusitis 02/01/2016   Essential hypertension 02/01/2016   Asthma with acute exacerbation 02/01/2016   Lumbar radiculopathy, acute 11/01/2015   Osteoporosis, 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 [place] [address] [place], [place], [place] Phone: [phone]   Fax:  [phone]  Name: Jadelynn Boylan MRN: [name] Date of Birth: September 14, 1955

## 2021-10-31 ENCOUNTER — Ambulatory Visit: Payer: BC Managed Care – PPO | Admitting: Physical Therapy

## 2021-10-31 ENCOUNTER — Other Ambulatory Visit: Payer: Self-pay

## 2021-10-31 DIAGNOSIS — R2689 Other abnormalities of gait and mobility: Secondary | ICD-10-CM | POA: Diagnosis not present

## 2021-10-31 DIAGNOSIS — M6281 Muscle weakness (generalized): Secondary | ICD-10-CM

## 2021-10-31 DIAGNOSIS — M25571 Pain in right ankle and joints of right foot: Secondary | ICD-10-CM | POA: Diagnosis not present

## 2021-10-31 DIAGNOSIS — M25572 Pain in left ankle and joints of left foot: Secondary | ICD-10-CM

## 2021-10-31 DIAGNOSIS — M25512 Pain in left shoulder: Secondary | ICD-10-CM

## 2021-10-31 DIAGNOSIS — G2589 Other specified extrapyramidal and movement disorders: Secondary | ICD-10-CM

## 2021-10-31 DIAGNOSIS — T07XXXA Unspecified multiple injuries, initial encounter: Secondary | ICD-10-CM | POA: Diagnosis not present

## 2021-10-31 DIAGNOSIS — Z09 Encounter for follow-up examination after completed treatment for conditions other than malignant neoplasm: Secondary | ICD-10-CM | POA: Diagnosis not present

## 2021-10-31 DIAGNOSIS — S42309A Unspecified fracture of shaft of humerus, unspecified arm, initial encounter for closed fracture: Secondary | ICD-10-CM | POA: Diagnosis not present

## 2021-10-31 DIAGNOSIS — S82142A Displaced bicondylar fracture of left tibia, initial encounter for closed fracture: Secondary | ICD-10-CM | POA: Diagnosis not present

## 2021-10-31 DIAGNOSIS — M810 Age-related osteoporosis without current pathological fracture: Secondary | ICD-10-CM | POA: Diagnosis not present

## 2021-10-31 NOTE — Therapy (Signed)
[place], [place], [address] Phone: [phone]   Fax:  [phone]  Physical Therapy Treatment  Patient Details  Name: Kathryn Boyer MRN: [id number] Date of Birth: [birth year] Referring Provider (PT): Dr Altamese Roosevelt; Dr Da Griffin Basil   Encounter Date: 10/31/2021   PT End of Session - 10/31/21 1009     Visit Number 9    Number of Visits 30    Date for PT Re-Evaluation 01/02/22    Authorization Type BCBS    PT Start Time 1015    PT Stop Time 1100    PT Time Calculation (min) 45 min    Activity Tolerance Patient tolerated treatment well             Past Medical History:  Diagnosis Date   Anxiety    Asthma    Hyperlipidemia    Hypertension    Plantar fasciitis    Thyroid disease     Past Surgical History:  Procedure Laterality Date   ABDOMINAL HYSTERECTOMY     BREAST SURGERY     NOSE SURGERY     ORIF ANKLE FRACTURE Left 07/23/2021   Procedure: OPEN REDUCTION INTERNAL FIXATION (ORIF) ANKLE FRACTURE;  Surgeon: Altamese Ranger, MD;  Location: [place];  Service: Orthopedics;  Laterality: Left;   REVERSE SHOULDER ARTHROPLASTY Left 07/25/2021   Procedure: LEFT REVERSE SHOULDER ARTHROPLASTY;  Surgeon: Hiram Gash, MD;  Location: [place];  Service: Orthopedics;  Laterality: Left;    There were no vitals filed for this visit.   Subjective Assessment - 10/31/21 1017     Subjective Pt states her L foot hurt a lot after standing for prolonged periods the day before. 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 Yes    Pain Score 3     Pain Location Leg    Pain Orientation Left                               [facility] Adult PT Treatment/Exercise - 10/31/21 0001       Knee/Hip Exercises:  Stretches   Active Hamstring Stretch Left;30 seconds    Gastroc Stretch Left;30 seconds    Soleus Stretch 30 seconds      Manual Therapy   Manual Therapy Taping    Manual therapy comments Skilled palpation and assessment for TPDN    Soft tissue mobilization STM & TPR L hamstring and gastroc    Passive ROM MWM for L ankle DF    Kinesiotex Facilitate Muscle      Kinesiotix   Facilitate Muscle  "I" strip for VMO and "I" strip for vastus lateralis      Ankle Exercises: Aerobic   Stationary Bike L1 x 5 min      Ankle Exercises: Standing   Heel Raises Both;20 reps    Other Standing Ankle Exercises Mini squats 2x10    Other Standing Ankle Exercises tandem stance 2x30 sec; forward step up 2x10, lateral step tap 2x10 on 6" step with intermittent UE assist              Trigger Point Dry Needling - 10/31/21 0001     Consent Given? Yes    Muscles Treated  Lower Quadrant Gastrocnemius;Soleus    Gastrocnemius Response Palpable increased muscle length    Soleus Response Palpable increased muscle length                     PT Short Term Goals - 10/10/21 1300       PT SHORT TERM GOAL #1   Title Improve posture and alignment with patient to deonstrate improved upright posture with posterior shoudler girdle engaged therefore improving shoulder mobilty and function    Time 6    Period Weeks    Status New    Target Date 11/21/21      PT SHORT TERM GOAL #2   Title Increase AROM Lt shoulder to 100-110 degress flexion and scaption    Time 6    Period Weeks    Status New    Target Date 11/21/21      PT SHORT TERM GOAL #3   Title Increase AROM Lt ankle to 5 deg ankle DF and 30 deg PF    Time 6    Period Weeks    Status New    Target Date 11/21/21      PT SHORT TERM GOAL #4   Title Patient to demonstrate improved gait pattern with least assistive device for household distances of 100-120 ft    Time 6    Period Weeks    Status New    Target Date 11/21/21      PT  SHORT TERM GOAL #5   Title Patient to negotiate 4 steps to enter and exit home with assistive device as needed (dies not have railing either side of steps)    Time 6    Period Weeks    Status New    Target Date 11/21/21               PT Long Term Goals - 10/10/21 1304       PT LONG TERM GOAL #1   Title Functional AROM Lt shoulder allowing patient to return to normal functional activities    Time 12    Period Weeks    Status New    Target Date 01/02/22      PT LONG TERM GOAL #2   Title 4+/5 to 5/5 strength Lt shoulder and bilat ankles    Time 12    Period Weeks    Status New    Target Date 01/02/22      PT LONG TERM GOAL #3   Title Ambulation community distances of several hundred yards without assistive device    Time 12    Period Weeks    Status New    Target Date 01/02/22      PT LONG TERM GOAL #4   Title Independent in HEP(including aquatic program as indicated)    Time 12    Period Weeks    Status New    Target Date 01/02/22      PT LONG TERM GOAL #5   Title Improve functional limitation score to 58    Time 12    Period Weeks    Status New    Target Date 01/02/22                   Plan - 10/31/21 1100     Clinical Impression Statement Trialed TPDN through pt's gastroc. Provided manual work through hamstrings this session. Trialed L knee taping as well for stability. Continued to work on improving foot/ankle mobility and L LE strengthening/stability  this session.    Personal Factors and Comorbidities Age;Fitness;Past/Current Experience;Comorbidity 1;Comorbidity 2    Comorbidities hypertension; osteoporesis    Examination-Activity Limitations Locomotion Level;Transfers;Reach Overhead;Bend;Sit;Carry;Sleep;Squat;Dressing;Stairs;Stand;Lift;Toileting    Stability/Clinical Decision Making Evolving/Moderate complexity    Rehab Potential Good    PT Frequency 3x / week    PT Duration 12 weeks    PT Treatment/Interventions ADLs/Self Care Home  Management;Aquatic Therapy;Cryotherapy;Electrical Stimulation;Iontophoresis 4mg /ml Dexamethasone;Moist Heat;Ultrasound;Functional mobility training;Gait training;Stair training;Therapeutic activities;Therapeutic exercise;Balance training;Neuromuscular re-education;Patient/family education;Manual techniques;Passive range of motion;Dry needling;Taping    PT Next Visit Plan review HEP; progress with shoulder and bilat anle rehab - deep tissue work vs DN for shoulder girdle(not discussed), PROM and stretching Rt shoulder; ROM, stretching, strengthening, gait training, stairs, progressing rehab as tolerated and per protocol; modalities as indicated    PT Home Exercise Plan [id number]    Consulted and Agree with Plan of Care Patient             Patient will benefit from skilled therapeutic intervention in order to improve the following deficits and impairments:  Abnormal gait, Decreased range of motion, Increased fascial restricitons, Decreased activity tolerance, Pain, Decreased balance, Impaired flexibility, Improper body mechanics, Decreased mobility, Decreased strength, Postural dysfunction  Visit Diagnosis: Acute pain of left shoulder  Scapular dyskinesis  Muscle weakness (generalized)  Pain in left ankle and joints of left foot  Other abnormalities of gait and mobility  Pain in right ankle and joints of right foot     Problem List Patient Active Problem List   Diagnosis Date Noted   Preventative health care 10/26/2021   Pressure injury of skin of sacral region 08/10/2021   Acute cystitis without hematuria 08/10/2021   Acute blood loss anemia    History of hypertension    Drug induced constipation    Postoperative pain    Critical polytrauma 07/27/2021   Humerus fracture 07/21/2021   Tibial plateau fracture, left 07/21/2021   Nondisplaced fracture of fifth metatarsal bone, right foot, initial encounter for closed fracture 07/21/2021   Nondisplaced fracture of lateral malleolus  of right fibula, initial encounter for closed fracture 07/21/2021   Left trimalleolar fracture, closed, initial encounter 07/21/2021   Fall 07/20/2021   Urine frequency 04/29/2019   Chest pain 04/29/2019   Urinary frequency 04/29/2019   Post-menopausal 04/29/2019   Allergic rhinitis 10/13/2018   Mild intermittent asthma with acute exacerbation 10/13/2018   Gastroesophageal reflux disease 10/13/2018   Right hip pain 06/01/2018   Chronic low back pain 05/25/2018   Acute pain of right knee 05/25/2018   Thoracic aorta atherosclerosis ([place]) 05/25/2018   Generalized anxiety disorder 03/22/2017   Hyperlipidemia 03/22/2017   Hypothyroidism 03/22/2017   Osteoporosis 12/20/2016   Encounter for counseling 09/18/2016   Morbid obesity ([place]) 02/06/2016   Vaginal dryness 02/06/2016   Allergic rhinitis due to pollen 02/01/2016   Acute maxillary sinusitis 02/01/2016   Essential hypertension 02/01/2016   Asthma with acute exacerbation 02/01/2016   Lumbar radiculopathy, acute 11/01/2015   Osteoporosis, 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   Adjustment disorder with anxiety 02/08/2015   Abnormal weight gain 02/08/2015    [facility] April Gordy Levan, PT, DPT 10/31/2021, 1:02 PM  [facility] [place] [address] [place], [place], [address] Phone: [phone]   Fax:  [phone]  Name: Lakeiya Gausman MRN: [id number] Date of Birth: 08/01/1956

## 2021-11-01 ENCOUNTER — Encounter: Payer: BC Managed Care – PPO | Admitting: Physical Therapy

## 2021-11-02 ENCOUNTER — Other Ambulatory Visit: Payer: Self-pay

## 2021-11-02 ENCOUNTER — Ambulatory Visit: Payer: BC Managed Care – PPO | Admitting: Physical Therapy

## 2021-11-02 DIAGNOSIS — G2589 Other specified extrapyramidal and movement disorders: Secondary | ICD-10-CM

## 2021-11-02 DIAGNOSIS — M25571 Pain in right ankle and joints of right foot: Secondary | ICD-10-CM | POA: Diagnosis not present

## 2021-11-02 DIAGNOSIS — M25572 Pain in left ankle and joints of left foot: Secondary | ICD-10-CM | POA: Diagnosis not present

## 2021-11-02 DIAGNOSIS — M6281 Muscle weakness (generalized): Secondary | ICD-10-CM | POA: Diagnosis not present

## 2021-11-02 DIAGNOSIS — Z09 Encounter for follow-up examination after completed treatment for conditions other than malignant neoplasm: Secondary | ICD-10-CM

## 2021-11-02 DIAGNOSIS — M25512 Pain in left shoulder: Secondary | ICD-10-CM | POA: Diagnosis not present

## 2021-11-02 DIAGNOSIS — R2689 Other abnormalities of gait and mobility: Secondary | ICD-10-CM

## 2021-11-02 NOTE — Therapy (Signed)
Kathryn Boyer  [place], [place], [address] Phone: [phone]   Fax:  [phone]  Physical Therapy Treatment  Patient Details  Name: Kathryn Boyer MRN: [id number] Date of Birth: 1956-02-15 Referring Provider (PT): Dr Altamese Castalia; Dr Da Griffin Basil   Encounter Date: 11/02/2021   PT End of Session - 11/02/21 1016     Visit Number 10    Number of Visits 30    Date for PT Re-Evaluation 01/02/22    Authorization Type BCBS    PT Start Time 1016    PT Stop Time 1100    PT Time Calculation (min) 44 min    Activity Tolerance Patient tolerated treatment well    Behavior During Therapy [facility] for tasks assessed/performed             Past Medical History:  Diagnosis Date   Anxiety    Asthma    Hyperlipidemia    Hypertension    Plantar fasciitis    Thyroid disease     Past Surgical History:  Procedure Laterality Date   ABDOMINAL HYSTERECTOMY     BREAST SURGERY     NOSE SURGERY     ORIF ANKLE FRACTURE Left 07/23/2021   Procedure: OPEN REDUCTION INTERNAL FIXATION (ORIF) ANKLE FRACTURE;  Surgeon: Altamese [place], MD;  Location: [place];  Service: Orthopedics;  Laterality: Left;   REVERSE SHOULDER ARTHROPLASTY Left 07/25/2021   Procedure: LEFT REVERSE SHOULDER ARTHROPLASTY;  Surgeon: Hiram Gash, MD;  Location: [place];  Service: Orthopedics;  Laterality: Left;    There were no vitals filed for this visit.   Subjective Assessment - 11/02/21 1019     Subjective Pt states that her knee is still bothering her but it is feeling better. Pt thinks taping and TPDN did help. Pt reports her shoulder was aching a lot last night.    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 Yes    Pain Score 4     Pain Location Leg    Pain Orientation Left    Pain Descriptors / Indicators Aching    Pain  Score 2    Pain Location Shoulder    Pain Orientation Left                [facility] PT Assessment - 11/02/21 0001       Assessment   Medical Diagnosis Lt reverse TSA; Lt/Rt ankle fractures    Referring Provider (PT) Dr Altamese [place]; Dr Da Griffin Basil    Onset Date/Surgical Date 07/20/21    Hand Dominance Right    Next MD Visit 10/23/21; 11/14/21                           [facility] Adult PT Treatment/Exercise - 11/02/21 0001       Shoulder Exercises: Supine   Protraction Strengthening;10 reps;Left    Protraction Weight (lbs) 1    Protraction Limitations with retraction    External Rotation Strengthening;Left;20 reps    External Rotation Weight (lbs) 1 lb and then 2 lb    External Rotation Limitations Reviewed shoulder ER with dowel assist x10    Internal Rotation Strengthening    Internal Rotation Weight (lbs) 1 lb and then 2 lb    Flexion Strengthening;Left;10 reps;Weights    Shoulder Flexion Weight (lbs)  1    ABduction Strengthening;20 reps;Weights    Shoulder ABduction Weight (lbs) 1      Manual Therapy   Soft tissue mobilization STM & TPR L bicep, pec and UT    Passive ROM shoulder flex, ext, abd, IR/ER      Ankle Exercises: Aerobic   Nustep L5 x 5 min UEs/LEs                     PT Education - 11/02/21 1109     Education Details Reinforced with pt to perform her shoulder exercises one day and then her leg exercises another day. Discussed using machine strengthening at the gym    Person(s) Educated Patient    Methods Explanation;Demonstration;Tactile cues;Verbal cues;Handout    Comprehension Verbalized understanding;Returned demonstration;Verbal cues required;Tactile cues required              PT Short Term Goals - 10/10/21 1300       PT SHORT TERM GOAL #1   Title Improve posture and alignment with patient to deonstrate improved upright posture with posterior shoudler girdle engaged therefore improving shoulder mobilty and function     Time 6    Period Weeks    Status New    Target Date 11/21/21      PT SHORT TERM GOAL #2   Title Increase AROM Lt shoulder to 100-110 degress flexion and scaption    Time 6    Period Weeks    Status New    Target Date 11/21/21      PT SHORT TERM GOAL #3   Title Increase AROM Lt ankle to 5 deg ankle DF and 30 deg PF    Time 6    Period Weeks    Status New    Target Date 11/21/21      PT SHORT TERM GOAL #4   Title Patient to demonstrate improved gait pattern with least assistive device for household distances of 100-120 ft    Time 6    Period Weeks    Status New    Target Date 11/21/21      PT SHORT TERM GOAL #5   Title Patient to negotiate 4 steps to enter and exit home with assistive device as needed (dies not have railing either side of steps)    Time 6    Period Weeks    Status New    Target Date 11/21/21               PT Long Term Goals - 10/10/21 1304       PT LONG TERM GOAL #1   Title Functional AROM Lt shoulder allowing patient to return to normal functional activities    Time 12    Period Weeks    Status New    Target Date 01/02/22      PT LONG TERM GOAL #2   Title 4+/5 to 5/5 strength Lt shoulder and bilat ankles    Time 12    Period Weeks    Status New    Target Date 01/02/22      PT LONG TERM GOAL #3   Title Ambulation community distances of several hundred yards without assistive device    Time 12    Period Weeks    Status New    Target Date 01/02/22      PT LONG TERM GOAL #4   Title Independent in HEP(including aquatic program as indicated)    Time 12    Period  Weeks    Status New    Target Date 01/02/22      PT LONG TERM GOAL #5   Title Improve functional limitation score to 58    Time 12    Period Weeks    Status New    Target Date 01/02/22                   Plan - 11/02/21 1110     Clinical Impression Statement This session focused primarily on her shoulder. Continued to stretch shoulder in all motions -- can see  improvements in her ER/IR. Worked on strengthening with free weights 1-2 lbs. Required consistent cues to decrease UT overactivity and to keep shoulder blade down. Ice provided end of session to decrease soreness.    Personal Factors and Comorbidities Age;Fitness;Past/Current Experience;Comorbidity 1;Comorbidity 2    Comorbidities hypertension; osteoporesis    Examination-Activity Limitations Locomotion Level;Transfers;Reach Overhead;Bend;Sit;Carry;Sleep;Squat;Dressing;Stairs;Stand;Lift;Toileting    Stability/Clinical Decision Making Evolving/Moderate complexity    Rehab Potential Good    PT Frequency 3x / week    PT Duration 12 weeks    PT Treatment/Interventions ADLs/Self Care Home Management;Aquatic Therapy;Cryotherapy;Electrical Stimulation;Iontophoresis 4mg /ml Dexamethasone;Moist Heat;Ultrasound;Functional mobility training;Gait training;Stair training;Therapeutic activities;Therapeutic exercise;Balance training;Neuromuscular re-education;Patient/family education;Manual techniques;Passive range of motion;Dry needling;Taping    PT Next Visit Plan review HEP; progress with shoulder and bilat anle rehab - deep tissue work vs DN for shoulder girdle(not discussed), PROM and stretching Rt shoulder; ROM, stretching, strengthening, gait training, stairs, progressing rehab as tolerated and per protocol; modalities as indicated    PT Home Exercise Plan [id number]    Consulted and Agree with Plan of Care Patient             Patient will benefit from skilled therapeutic intervention in order to improve the following deficits and impairments:  Abnormal gait, Decreased range of motion, Increased fascial restricitons, Decreased activity tolerance, Pain, Decreased balance, Impaired flexibility, Improper body mechanics, Decreased mobility, Decreased strength, Postural dysfunction  Visit Diagnosis: Acute pain of left shoulder  Scapular dyskinesis  Muscle weakness (generalized)  Pain in left ankle and  joints of left foot  Other abnormalities of gait and mobility  Pain in right ankle and joints of right foot  Postop check     Problem List Patient Active Problem List   Diagnosis Date Noted   Preventative health care 10/26/2021   Pressure injury of skin of sacral region 08/10/2021   Acute cystitis without hematuria 08/10/2021   Acute blood loss anemia    History of hypertension    Drug induced constipation    Postoperative pain    Critical polytrauma 07/27/2021   Humerus fracture 07/21/2021   Tibial plateau fracture, left 07/21/2021   Nondisplaced fracture of fifth metatarsal bone, right foot, initial encounter for closed fracture 07/21/2021   Nondisplaced fracture of lateral malleolus of right fibula, initial encounter for closed fracture 07/21/2021   Left trimalleolar fracture, closed, initial encounter 07/21/2021   Fall 07/20/2021   Urine frequency 04/29/2019   Chest pain 04/29/2019   Urinary frequency 04/29/2019   Post-menopausal 04/29/2019   Allergic rhinitis 10/13/2018   Mild intermittent asthma with acute exacerbation 10/13/2018   Gastroesophageal reflux disease 10/13/2018   Right hip pain 06/01/2018   Chronic low back pain 05/25/2018   Acute pain of right knee 05/25/2018   Thoracic aorta atherosclerosis ([place]) 05/25/2018   Generalized anxiety disorder 03/22/2017   Hyperlipidemia 03/22/2017   Hypothyroidism 03/22/2017   Osteoporosis 12/20/2016   Encounter for counseling 09/18/2016   Morbid obesity (  [place]) 02/06/2016   Vaginal dryness 02/06/2016   Allergic rhinitis due to pollen 02/01/2016   Acute maxillary sinusitis 02/01/2016   Essential hypertension 02/01/2016   Asthma with acute exacerbation 02/01/2016   Lumbar radiculopathy, acute 11/01/2015   Osteoporosis, 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

## 2021-11-03 ENCOUNTER — Other Ambulatory Visit: Payer: Self-pay | Admitting: Family Medicine

## 2021-11-03 DIAGNOSIS — E785 Hyperlipidemia, unspecified: Secondary | ICD-10-CM

## 2021-11-05 ENCOUNTER — Ambulatory Visit: Payer: BC Managed Care – PPO | Admitting: Physical Therapy

## 2021-11-05 ENCOUNTER — Other Ambulatory Visit: Payer: Self-pay

## 2021-11-05 DIAGNOSIS — M25571 Pain in right ankle and joints of right foot: Secondary | ICD-10-CM

## 2021-11-05 DIAGNOSIS — M25572 Pain in left ankle and joints of left foot: Secondary | ICD-10-CM

## 2021-11-05 DIAGNOSIS — G2589 Other specified extrapyramidal and movement disorders: Secondary | ICD-10-CM

## 2021-11-05 DIAGNOSIS — M25512 Pain in left shoulder: Secondary | ICD-10-CM | POA: Diagnosis not present

## 2021-11-05 DIAGNOSIS — R2689 Other abnormalities of gait and mobility: Secondary | ICD-10-CM

## 2021-11-05 DIAGNOSIS — Z09 Encounter for follow-up examination after completed treatment for conditions other than malignant neoplasm: Secondary | ICD-10-CM | POA: Diagnosis not present

## 2021-11-05 DIAGNOSIS — M6281 Muscle weakness (generalized): Secondary | ICD-10-CM | POA: Diagnosis not present

## 2021-11-05 NOTE — Therapy (Signed)
[facility] [place] [address] [place], [place], [address] Phone: [phone]   Fax:  [phone]  Physical Therapy Treatment  Patient Details  Name: Kathryn Boyer MRN: [id number] Date of Birth: 03/11/1956 Referring Provider (PT): Dr Myrene Galas; Dr Da Everardo [place]   Encounter Date: 11/05/2021   PT End of Session - 11/05/21 0957     Visit Number 11    Number of Visits 30    Date for PT Re-Evaluation 01/02/22    Authorization Type BCBS    PT Start Time 1005    PT Stop Time 1050    PT Time Calculation (min) 45 min    Activity Tolerance Patient tolerated treatment well    Behavior During Therapy [facility] for tasks assessed/performed             Past Medical History:  Diagnosis Date   Anxiety    Asthma    Hyperlipidemia    Hypertension    Plantar fasciitis    Thyroid disease     Past Surgical History:  Procedure Laterality Date   ABDOMINAL HYSTERECTOMY     BREAST SURGERY     NOSE SURGERY     ORIF ANKLE FRACTURE Left 07/23/2021   Procedure: OPEN REDUCTION INTERNAL FIXATION (ORIF) ANKLE FRACTURE;  Surgeon: Myrene Galas, MD;  Location[facility];  Service: Orthopedics;  Laterality: Left;   REVERSE SHOULDER ARTHROPLASTY Left 07/25/2021   Procedure: LEFT REVERSE SHOULDER ARTHROPLASTY;  Surgeon: Bjorn Pippin, MD;  Location: [facility];  Service: Orthopedics;  Laterality: Left;    There were no vitals filed for this visit.   Subjective Assessment - 11/05/21 1006     Subjective Pt reports her R hand is bothering her. Pt is going to see Dr. Carola Frost for her knee.    Pertinent History ORIF Lt ankle 07/23/21; reverse TSA 07/25/21    Patient Stated Goals get shoulder working again; fix her on hair; be able to walk well                [facility] PT Assessment - 11/05/21 0001       Assessment   Medical Diagnosis Lt reverse TSA; Lt/Rt ankle fractures    Referring Provider (PT) Dr Myrene Galas; Dr Da Everardo [place]    Onset Date/Surgical  Date 07/20/21    Hand Dominance Right                           [facility] Adult PT Treatment/Exercise - 11/05/21 0001       Ambulation/Gait   Ambulation Distance (Feet) 60 Feet    Assistive device None    Gait Pattern Antalgic;Left flexed knee in stance;Lateral hip instability;Decreased weight shift to left;Trunk flexed    Ambulation Surface Level;Indoor    Gait Comments Amb next to counter for safety. Cues to decrease trunk flexion and lateral flexion      Manual Therapy   Soft tissue mobilization IASTM and TPR vastus lateralis, rectus femoris, ITB      [employer]   Facilitate Muscle  "I" strip for VMO and "I" strip for vastus lateralis      Ankle Exercises: Aerobic   Nustep L6 x 6 min UEs/LEs      Ankle Exercises: Seated   Other Seated Ankle Exercises long arc quad 3x10 red tband      Ankle Exercises: Standing   Other Standing Ankle Exercises mini squat 3x10, tandem stance 2x30 sec    Other Standing Ankle  Exercises hip abduction red tband 3x10      Ankle Exercises: Stretches   Other Stretch quad stretch prone 2x30 sec; ITB stretch 2x30 sec                       PT Short Term Goals - 10/10/21 1300       PT SHORT TERM GOAL #1   Title Improve posture and alignment with patient to deonstrate improved upright posture with posterior shoudler girdle engaged therefore improving shoulder mobilty and function    Time 6    Period Weeks    Status New    Target Date 11/21/21      PT SHORT TERM GOAL #2   Title Increase AROM Lt shoulder to 100-110 degress flexion and scaption    Time 6    Period Weeks    Status New    Target Date 11/21/21      PT SHORT TERM GOAL #3   Title Increase AROM Lt ankle to 5 deg ankle DF and 30 deg PF    Time 6    Period Weeks    Status New    Target Date 11/21/21      PT SHORT TERM GOAL #4   Title Patient to demonstrate improved gait pattern with least assistive device for household distances of 100-120 ft    Time 6     Period Weeks    Status New    Target Date 11/21/21      PT SHORT TERM GOAL #5   Title Patient to negotiate 4 steps to enter and exit home with assistive device as needed (dies not have railing either side of steps)    Time 6    Period Weeks    Status New    Target Date 11/21/21               PT Long Term Goals - 10/10/21 1304       PT LONG TERM GOAL #1   Title Functional AROM Lt shoulder allowing patient to return to normal functional activities    Time 12    Period Weeks    Status New    Target Date 01/02/22      PT LONG TERM GOAL #2   Title 4+/5 to 5/5 strength Lt shoulder and bilat ankles    Time 12    Period Weeks    Status New    Target Date 01/02/22      PT LONG TERM GOAL #3   Title Ambulation community distances of several hundred yards without assistive device    Time 12    Period Weeks    Status New    Target Date 01/02/22      PT LONG TERM GOAL #4   Title Independent in HEP(including aquatic program as indicated)    Time 12    Period Weeks    Status New    Target Date 01/02/22      PT LONG TERM GOAL #5   Title Improve functional limitation score to 58    Time 12    Period Weeks    Status New    Target Date 01/02/22                   Plan - 11/05/21 1056     Clinical Impression Statement Session focused on pt's ankle/knee. Continued to work on L LE strengthening. Trialed amb without a/d - pt remains antalgic due to L  knee pain. Provided manual work and stretching to address pt's knee pain. Ankle is demonstrating good ROM. Pt interested in more therapy visits -- discussed option of doing OT for her L shoulder. Provided pt info on what machines are safe to work on in the gym.    Personal Factors and Comorbidities Age;Fitness;Past/Current Experience;Comorbidity 1;Comorbidity 2    Comorbidities hypertension; osteoporesis    Examination-Activity Limitations Locomotion Level;Transfers;Reach  Overhead;Bend;Sit;Carry;Sleep;Squat;Dressing;Stairs;Stand;Lift;Toileting    Stability/Clinical Decision Making Evolving/Moderate complexity    Rehab Potential Good    PT Frequency 3x / week    PT Duration 12 weeks    PT Treatment/Interventions ADLs/Self Care Home Management;Aquatic Therapy;Cryotherapy;Electrical Stimulation;Iontophoresis /ml Dexamethasone;Moist Heat;Ultrasound;Functional mobility training;Gait training;Stair training;Therapeutic activities;Therapeutic exercise;Balance training;Neuromuscular re-education;Patient/family education;Manual techniques;Passive range of motion;Dry needling;Taping    PT Next Visit Plan review HEP; progress with shoulder and bilat anle rehab - deep tissue work vs DN for shoulder girdle(not discussed), PROM and stretching Rt shoulder; ROM, stretching, strengthening, gait training, stairs, progressing rehab as tolerated and per protocol; modalities as indicated    PT Home Exercise Plan [email or web address]    Consulted and Agree with Plan of Care Patient             Patient will benefit from skilled therapeutic intervention in order to improve the following deficits and impairments:  Abnormal gait, Decreased range of motion, Increased fascial restricitons, Decreased activity tolerance, Pain, Decreased balance, Impaired flexibility, Improper body mechanics, Decreased mobility, Decreased strength, Postural dysfunction  Visit Diagnosis: Acute pain of left shoulder  Scapular dyskinesis  Muscle weakness (generalized)  Pain in left ankle and joints of left foot  Other abnormalities of gait and mobility  Pain in right ankle and joints of right foot     Problem List Patient Active Problem List   Diagnosis Date Noted   Preventative health care 10/26/2021   Pressure injury of skin of sacral region 08/10/2021   Acute cystitis without hematuria 08/10/2021   Acute blood loss anemia    History of hypertension    Drug induced constipation    Postoperative  pain    Critical polytrauma 07/27/2021   Humerus fracture 07/21/2021   Tibial plateau fracture, left 07/21/2021   Nondisplaced fracture of fifth metatarsal bone, right foot, initial encounter for closed fracture 07/21/2021   Nondisplaced fracture of lateral malleolus of right fibula, initial encounter for closed fracture 07/21/2021   Left trimalleolar fracture, closed, initial encounter 07/21/2021   Fall 07/20/2021   Urine frequency 04/29/2019   Chest pain 04/29/2019   Urinary frequency 04/29/2019   Post-menopausal 04/29/2019   Allergic rhinitis 10/13/2018   Mild intermittent asthma with acute exacerbation 10/13/2018   Gastroesophageal reflux disease 10/13/2018   Right hip pain 06/01/2018   Chronic low back pain 05/25/2018   Acute pain of right knee 05/25/2018   Thoracic aorta atherosclerosis ([facility]) 05/25/2018   Generalized anxiety disorder 03/22/2017   Hyperlipidemia 03/22/2017   Hypothyroidism 03/22/2017   Osteoporosis 12/20/2016   Encounter for counseling 09/18/2016   Morbid obesity ([facility]) 02/06/2016   Vaginal dryness 02/06/2016   Allergic rhinitis due to pollen 02/01/2016   Acute maxillary sinusitis 02/01/2016   Essential hypertension 02/01/2016   Asthma with acute exacerbation 02/01/2016   Lumbar radiculopathy, acute 11/01/2015   Osteoporosis, 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   Adjustment disorder with anxiety 02/08/2015   Abnormal weight gain 02/08/2015    [facility] [place], PT, DPT 11/05/2021, 11:01 AM  [facility] [place] [address] [place], [place], [address] Phone: [phone]  Fax:  [phone]  Name: Keenan BachelorDiane Casas MRN: [id number] Date of Birth: [birth year]

## 2021-11-06 ENCOUNTER — Other Ambulatory Visit: Payer: Self-pay | Admitting: Family

## 2021-11-07 ENCOUNTER — Encounter: Payer: Self-pay | Admitting: Rehabilitative and Restorative Service Providers"

## 2021-11-07 ENCOUNTER — Ambulatory Visit: Payer: BC Managed Care – PPO | Admitting: Rehabilitative and Restorative Service Providers"

## 2021-11-07 ENCOUNTER — Other Ambulatory Visit: Payer: Self-pay

## 2021-11-07 DIAGNOSIS — M25572 Pain in left ankle and joints of left foot: Secondary | ICD-10-CM

## 2021-11-07 DIAGNOSIS — M25571 Pain in right ankle and joints of right foot: Secondary | ICD-10-CM

## 2021-11-07 DIAGNOSIS — M6281 Muscle weakness (generalized): Secondary | ICD-10-CM | POA: Diagnosis not present

## 2021-11-07 DIAGNOSIS — R2689 Other abnormalities of gait and mobility: Secondary | ICD-10-CM | POA: Diagnosis not present

## 2021-11-07 DIAGNOSIS — M25512 Pain in left shoulder: Secondary | ICD-10-CM | POA: Diagnosis not present

## 2021-11-07 DIAGNOSIS — G2589 Other specified extrapyramidal and movement disorders: Secondary | ICD-10-CM | POA: Diagnosis not present

## 2021-11-07 DIAGNOSIS — Z09 Encounter for follow-up examination after completed treatment for conditions other than malignant neoplasm: Secondary | ICD-10-CM | POA: Diagnosis not present

## 2021-11-07 NOTE — Therapy (Signed)
[facility] [place] [address] [place], [place], [address] Phone: [phone]   Fax:  [phone]  Physical Therapy Treatment  Patient Details  Name: Kathryn Boyer MRN: [id number] Date of Birth: 1956-07-06 Referring Provider (PT): Dr Myrene Galas; Dr Da Everardo Pacific   Encounter Date: 11/07/2021   PT End of Session - 11/07/21 1009     Visit Number 12    Number of Visits 30    Date for PT Re-Evaluation 01/02/22    Authorization Type BCBS    PT Start Time 1009    PT Stop Time 1056    PT Time Calculation (min) 47 min    Activity Tolerance Patient tolerated treatment well             Past Medical History:  Diagnosis Date   Anxiety    Asthma    Hyperlipidemia    Hypertension    Plantar fasciitis    Thyroid disease     Past Surgical History:  Procedure Laterality Date   ABDOMINAL HYSTERECTOMY     BREAST SURGERY     NOSE SURGERY     ORIF ANKLE FRACTURE Left 07/23/2021   Procedure: OPEN REDUCTION INTERNAL FIXATION (ORIF) ANKLE FRACTURE;  Surgeon: Myrene Galas, MD;  Location: [facility] OR;  Service: Orthopedics;  Laterality: Left;   REVERSE SHOULDER ARTHROPLASTY Left 07/25/2021   Procedure: LEFT REVERSE SHOULDER ARTHROPLASTY;  Surgeon: Bjorn Pippin, MD;  Location: [facility] OR;  Service: Orthopedics;  Laterality: Left;    There were no vitals filed for this visit.   Subjective Assessment - 11/07/21 1010     Subjective Patient reports that she is in a lot of pain. Feet hurt; knee hurts. She sees Dr Carola Frost next week. Did go to the gym yesterday and worked with a Psychologist, educational for 20-30 min. on lower body exercises. Sore today. Frustrated with progress and continued pain    Currently in Pain? Yes    Pain Score 4     Pain Location Leg    Pain Orientation Left    Pain Descriptors / Indicators Aching    Pain Type Acute pain    Pain Onset More than a month ago    Pain Frequency Intermittent    Pain Score 4    Pain Location Shoulder     Pain Orientation Left    Pain Descriptors / Indicators Aching    Pain Type Acute pain                [facility] PT Assessment - 11/07/21 0001       Assessment   Medical Diagnosis Lt reverse TSA; Lt/Rt ankle fractures    Referring Provider (PT) Dr Myrene Galas; Dr Da Everardo Pacific    Onset Date/Surgical Date 07/20/21    Hand Dominance Right    Next MD Visit 11/14/21      AROM   Right Shoulder Extension 62 Degrees    Right Shoulder Flexion 163 Degrees    Right Shoulder ABduction 155 Degrees    Right Shoulder External Rotation 90 Degrees    Left Shoulder Extension 43 Degrees    Left Shoulder Flexion 118 Degrees   scapular dyskinesis   Left Shoulder ABduction 92 Degrees   scapular dyskinesis   Left Shoulder Internal Rotation --   hand to sacrum   Left Shoulder External Rotation 8 Degrees   elbow at side flexed at 90 deg     PROM   Left Shoulder Flexion 152 Degrees    Left Shoulder  ABduction 150 Degrees   in scapular plane   Left Shoulder External Rotation 45 Degrees   in scapular plane                          [facility] Adult PT Treatment/Exercise - 11/07/21 0001       Shoulder Exercises: Standing   Internal Rotation AROM;Both;10 reps    Internal Rotation Limitations sliding cane up along buttocks/back    Extension AROM;Both;10 reps    Extension Limitations holding cane      Shoulder Exercises: Pulleys   Flexion Limitations 10 sec hold x 10 VC/TC to keep Lt shoulder girdle down and back    Scaption Limitations 10 sec x 10 reps VC/TC to keep Lt shoulder girdle down and back    Other Pulley Exercises IR/ER opening arms/chest to side pulley at ~ chin height 10 reps x 2 sets      Shoulder Exercises: Stretch   External Rotation Stretch 5 reps;10 seconds   using cane - standing - elbow at 90 deg flex   Table Stretch - Flexion 5 reps;10 seconds    Table Stretch -Flexion Limitations standing at counter stepping back                       PT Short Term  Goals - 10/10/21 1300       PT SHORT TERM GOAL #1   Title Improve posture and alignment with patient to deonstrate improved upright posture with posterior shoudler girdle engaged therefore improving shoulder mobilty and function    Time 6    Period Weeks    Status New    Target Date 11/21/21      PT SHORT TERM GOAL #2   Title Increase AROM Lt shoulder to 100-110 degress flexion and scaption    Time 6    Period Weeks    Status New    Target Date 11/21/21      PT SHORT TERM GOAL #3   Title Increase AROM Lt ankle to 5 deg ankle DF and 30 deg PF    Time 6    Period Weeks    Status New    Target Date 11/21/21      PT SHORT TERM GOAL #4   Title Patient to demonstrate improved gait pattern with least assistive device for household distances of 100-120 ft    Time 6    Period Weeks    Status New    Target Date 11/21/21      PT SHORT TERM GOAL #5   Title Patient to negotiate 4 steps to enter and exit home with assistive device as needed (dies not have railing either side of steps)    Time 6    Period Weeks    Status New    Target Date 11/21/21               PT Long Term Goals - 10/10/21 1304       PT LONG TERM GOAL #1   Title Functional AROM Lt shoulder allowing patient to return to normal functional activities    Time 12    Period Weeks    Status New    Target Date 01/02/22      PT LONG TERM GOAL #2   Title 4+/5 to 5/5 strength Lt shoulder and bilat ankles    Time 12    Period Weeks    Status New  Target Date 01/02/22      PT LONG TERM GOAL #3   Title Ambulation community distances of several hundred yards without assistive device    Time 12    Period Weeks    Status New    Target Date 01/02/22      PT LONG TERM GOAL #4   Title Independent in HEP(including aquatic program as indicated)    Time 12    Period Weeks    Status New    Target Date 01/02/22      PT LONG TERM GOAL #5   Title Improve functional limitation score to 58    Time 12    Period  Weeks    Status New    Target Date 01/02/22                   Plan - 11/07/21 1013     Clinical Impression Statement Continued pain in Lt knee; Lt shoulder; bilat feet. Patient went to gym yesterday and worked lower body with trainer for 20-30 min and will work 2x/wk. Focus on shoulder rehab today working on ROM and scapular stabilization. Note gains in AROM/PROM Lt shoulder    Rehab Potential Good    PT Frequency 3x / week    PT Duration 12 weeks    PT Treatment/Interventions ADLs/Self Care Home Management;Aquatic Therapy;Cryotherapy;Electrical Stimulation;Iontophoresis 4mg /ml Dexamethasone;Moist Heat;Ultrasound;Functional mobility training;Gait training;Stair training;Therapeutic activities;Therapeutic exercise;Balance training;Neuromuscular re-education;Patient/family education;Manual techniques;Passive range of motion;Dry needling;Taping    PT Next Visit Plan review HEP; progress with shoulder and bilat anle rehab - deep tissue work vs DN for shoulder girdle(not discussed), PROM and stretching Rt shoulder; ROM, stretching, strengthening, gait training, stairs, progressing rehab as tolerated and per protocol; modalities as indicated    PT Home Exercise Plan [email or web address]    Consulted and Agree with Plan of Care Patient             Patient will benefit from skilled therapeutic intervention in order to improve the following deficits and impairments:     Visit Diagnosis: Acute pain of left shoulder  Scapular dyskinesis  Muscle weakness (generalized)  Pain in left ankle and joints of left foot  Other abnormalities of gait and mobility  Pain in right ankle and joints of right foot     Problem List Patient Active Problem List   Diagnosis Date Noted   Preventative health care 10/26/2021   Pressure injury of skin of sacral region 08/10/2021   Acute cystitis without hematuria 08/10/2021   Acute blood loss anemia    History of hypertension    Drug induced constipation     Postoperative pain    Critical polytrauma 07/27/2021   Humerus fracture 07/21/2021   Tibial plateau fracture, left 07/21/2021   Nondisplaced fracture of fifth metatarsal bone, right foot, initial encounter for closed fracture 07/21/2021   Nondisplaced fracture of lateral malleolus of right fibula, initial encounter for closed fracture 07/21/2021   Left trimalleolar fracture, closed, initial encounter 07/21/2021   Fall 07/20/2021   Urine frequency 04/29/2019   Chest pain 04/29/2019   Urinary frequency 04/29/2019   Post-menopausal 04/29/2019   Allergic rhinitis 10/13/2018   Mild intermittent asthma with acute exacerbation 10/13/2018   Gastroesophageal reflux disease 10/13/2018   Right hip pain 06/01/2018   Chronic low back pain 05/25/2018   Acute pain of right knee 05/25/2018   Thoracic aorta atherosclerosis (HCC) 05/25/2018   Generalized anxiety disorder 03/22/2017   Hyperlipidemia 03/22/2017   Hypothyroidism 03/22/2017   Osteoporosis 12/20/2016  Encounter for counseling 09/18/2016   Morbid obesity (HCC) 02/06/2016   Vaginal dryness 02/06/2016   Allergic rhinitis due to pollen 02/01/2016   Acute maxillary sinusitis 02/01/2016   Essential hypertension 02/01/2016   Asthma with acute exacerbation 02/01/2016   Lumbar radiculopathy, acute 11/01/2015   Osteoporosis, 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

## 2021-11-08 ENCOUNTER — Encounter: Payer: BC Managed Care – PPO | Admitting: Physical Therapy

## 2021-11-09 ENCOUNTER — Other Ambulatory Visit: Payer: Self-pay

## 2021-11-09 ENCOUNTER — Ambulatory Visit: Payer: BC Managed Care – PPO | Admitting: Physical Therapy

## 2021-11-09 DIAGNOSIS — R2689 Other abnormalities of gait and mobility: Secondary | ICD-10-CM | POA: Diagnosis not present

## 2021-11-09 DIAGNOSIS — M25572 Pain in left ankle and joints of left foot: Secondary | ICD-10-CM | POA: Diagnosis not present

## 2021-11-09 DIAGNOSIS — M6281 Muscle weakness (generalized): Secondary | ICD-10-CM

## 2021-11-09 DIAGNOSIS — M25571 Pain in right ankle and joints of right foot: Secondary | ICD-10-CM

## 2021-11-09 DIAGNOSIS — G2589 Other specified extrapyramidal and movement disorders: Secondary | ICD-10-CM

## 2021-11-09 DIAGNOSIS — M25512 Pain in left shoulder: Secondary | ICD-10-CM | POA: Diagnosis not present

## 2021-11-09 DIAGNOSIS — Z09 Encounter for follow-up examination after completed treatment for conditions other than malignant neoplasm: Secondary | ICD-10-CM | POA: Diagnosis not present

## 2021-11-09 NOTE — Therapy (Signed)
[facility] ?[place], [place], [address] ?Phone: [phone]   Fax:  [phone] ? ?Physical Therapy Treatment ? ?Patient Details  ?Name: Kathryn Boyer ?MRN: [id number] ?Date of Birth: [birth year] ?Referring Provider (PT): Dr Altamese [place]; Dr Da Griffin Basil ? ? ?Encounter Date: 11/09/2021 ? ? PT End of Session - 11/09/21 1016   ? ? Visit Number 13   ? Number of Visits 30   ? Date for PT Re-Evaluation 01/02/22   ? Authorization Type BCBS   ? PT Start Time 1016   ? PT Stop Time 1100   ? PT Time Calculation (min) 44 min   ? Activity Tolerance Patient tolerated treatment well   ? Behavior During Therapy [facility] for tasks assessed/performed   ? ?  ?  ? ?  ? ? ?Past Medical History:  ?Diagnosis Date  ? Anxiety   ? Asthma   ? Hyperlipidemia   ? Hypertension   ? Plantar fasciitis   ? Thyroid disease   ? ? ?Past Surgical History:  ?Procedure Laterality Date  ? ABDOMINAL HYSTERECTOMY    ? BREAST SURGERY    ? NOSE SURGERY    ? ORIF ANKLE FRACTURE Left 07/23/2021  ? Procedure: OPEN REDUCTION INTERNAL FIXATION (ORIF) ANKLE FRACTURE;  Surgeon: Altamese [place], MD;  Location: [facility];  Service: Orthopedics;  Laterality: Left;  ? REVERSE SHOULDER ARTHROPLASTY Left 07/25/2021  ? Procedure: LEFT REVERSE SHOULDER ARTHROPLASTY;  Surgeon: Hiram Gash, MD;  Location: [facility];  Service: Orthopedics;  Laterality: Left;  ? ? ?There were no vitals filed for this visit. ? ? Subjective Assessment - 11/09/21 1017   ? ? Subjective Pt states she will see MD for her knee next week. Pt continues to report anterior knee pain.   ? Pertinent History ORIF Lt ankle 07/23/21; reverse TSA 07/25/21   ? Patient Stated Goals get shoulder working again; fix her on hair; be able to walk well   ? Currently in Pain? Yes   ? Pain Score 4    ? Pain Location Leg   ? Pain Orientation Left   ? Pain Descriptors / Indicators Aching   ? Pain Type Acute pain   ? Pain Onset More than a month ago   ? ?  ?  ? ?   ? ? ? ? ? [facility] PT Assessment - 11/09/21 0001   ? ?  ? Assessment  ? Medical Diagnosis Lt reverse TSA; Lt/Rt ankle fractures   ? Referring Provider (PT) Dr Altamese [place]; Dr Da Griffin Basil   ? Onset Date/Surgical Date 07/20/21   ? Hand Dominance Right   ? Next MD Visit 11/14/21   ? ?  ?  ? ?  ? ? ? ? ? ? ? ? ? ? ? ? ? ? ? ? [facility] Adult PT Treatment/Exercise - 11/09/21 0001   ? ?  ? Ambulation/Gait  ? Ambulation Distance (Feet) 60 Feet   ? Assistive device None   ? Gait Pattern Antalgic;Left flexed knee in stance;Lateral hip instability;Decreased weight shift to left;Trunk flexed   ? Ambulation Surface Level;Indoor   ? Gait Comments improved gait pattern with cues for smaller steps (less antalgia)   ?  ? Manual Therapy  ? Manual therapy comments Skilled palpation and assessment for TPDN   ? Soft tissue mobilization IASTM and TPR vastus lateralis, rectus femoris, ITB   ?  ? Ankle Exercises: Stretches  ? Other Stretch quad stretch 2x30 sec supine;   ?  ?  Ankle Exercises: Sidelying  ? Other Sidelying Ankle Exercises Clamshell green tband 3x10   ?  ? Ankle Exercises: Supine  ? Other Supine Ankle Exercises single leg bridge 2x10   ?  ? Ankle Exercises: Standing  ? Heel Raises Both;20 reps   ? Other Standing Ankle Exercises mini squat 3x10   ? ?  ?  ? ?  ? ? ? Trigger Point Dry Needling - 11/09/21 0001   ? ? Consent Given? Yes   ? Muscles Treated Lower Quadrant Quadriceps   ? Quadriceps Response Twitch response elicited;Palpable increased muscle length   vastus lateralis  ? ?  ?  ? ?  ? ? ? ? ? ? ? ? ? ? PT Short Term Goals - 10/10/21 1300   ? ?  ? PT SHORT TERM GOAL #1  ? Title Improve posture and alignment with patient to deonstrate improved upright posture with posterior shoudler girdle engaged therefore improving shoulder mobilty and function   ? Time 6   ? Period Weeks   ? Status New   ? Target Date 11/21/21   ?  ? PT SHORT TERM GOAL #2  ? Title Increase AROM Lt shoulder to 100-110 degress flexion and scaption   ? Time 6   ?  Period Weeks   ? Status New   ? Target Date 11/21/21   ?  ? PT SHORT TERM GOAL #3  ? Title Increase AROM Lt ankle to 5 deg ankle DF and 30 deg PF   ? Time 6   ? Period Weeks   ? Status New   ? Target Date 11/21/21   ?  ? PT SHORT TERM GOAL #4  ? Title Patient to demonstrate improved gait pattern with least assistive device for household distances of 100-120 ft   ? Time 6   ? Period Weeks   ? Status New   ? Target Date 11/21/21   ?  ? PT SHORT TERM GOAL #5  ? Title Patient to negotiate 4 steps to enter and exit home with assistive device as needed (dies not have railing either side of steps)   ? Time 6   ? Period Weeks   ? Status New   ? Target Date 11/21/21   ? ?  ?  ? ?  ? ? ? ? PT Long Term Goals - 10/10/21 1304   ? ?  ? PT LONG TERM GOAL #1  ? Title Functional AROM Lt shoulder allowing patient to return to normal functional activities   ? Time 12   ? Period Weeks   ? Status New   ? Target Date 01/02/22   ?  ? PT LONG TERM GOAL #2  ? Title 4+/5 to 5/5 strength Lt shoulder and bilat ankles   ? Time 12   ? Period Weeks   ? Status New   ? Target Date 01/02/22   ?  ? PT LONG TERM GOAL #3  ? Title Ambulation community distances of several hundred yards without assistive device   ? Time 12   ? Period Weeks   ? Status New   ? Target Date 01/02/22   ?  ? PT LONG TERM GOAL #4  ? Title Independent in HEP(including aquatic program as indicated)   ? Time 12   ? Period Weeks   ? Status New   ? Target Date 01/02/22   ?  ? PT LONG TERM GOAL #5  ? Title Improve functional limitation score  to 58   ? Time 12   ? Period Weeks   ? Status New   ? Target Date 01/02/22   ? ?  ?  ? ?  ? ? ? ? ? ? ? ? Plan - 11/09/21 1100   ? ? Clinical Impression Statement Increased L ankle tightness this session. Multiple trigger points in adductors, gastroc/soleus, and quads. Provided TPDN for quad and performed manual work to address this. Progressed pt to green tband with good pt tolerance for her hip strengthening. Worked on gait with less antalgic  pattern noted after cueing for smaller even steps. Discussed aquatics.   ? Personal Factors and Comorbidities Age;Fitness;Past/Current Experience;Comorbidity 1;Comorbidity 2   ? Comorbidities hypertension; osteoporesis   ? Examination-Activity Limitations Locomotion Level;Transfers;Reach Overhead;Bend;Sit;Carry;Sleep;Squat;Dressing;Stairs;Stand;Lift;Toileting   ? Examination-Participation Restrictions Church;Cleaning;Community Activity;Driving;Laundry;Occupation;Meal Prep;Shop;Volunteer   ? Rehab Potential Good   ? PT Frequency 3x / week   ? PT Duration 12 weeks   ? PT Treatment/Interventions ADLs/Self Care Home Management;Aquatic Therapy;Cryotherapy;Electrical Stimulation;Iontophoresis 4mg /ml Dexamethasone;Moist Heat;Ultrasound;Functional mobility training;Gait training;Stair training;Therapeutic activities;Therapeutic exercise;Balance training;Neuromuscular re-education;Patient/family education;Manual techniques;Passive range of motion;Dry needling;Taping   ? PT Next Visit Plan review HEP; progress with shoulder and bilat anle rehab - deep tissue work vs DN for shoulder girdle(not discussed), PROM and stretching Rt shoulder; ROM, stretching, strengthening, gait training, stairs, progressing rehab as tolerated and per protocol; modalities as indicated   ? PT Home Exercise Plan [id number]   ? Consulted and Agree with Plan of Care Patient   ? ?  ?  ? ?  ? ? ?Patient will benefit from skilled therapeutic intervention in order to improve the following deficits and impairments:  Abnormal gait, Decreased range of motion, Increased fascial restricitons, Decreased activity tolerance, Pain, Decreased balance, Impaired flexibility, Improper body mechanics, Decreased mobility, Decreased strength, Postural dysfunction ? ?Visit Diagnosis: ?Acute pain of left shoulder ? ?Scapular dyskinesis ? ?Muscle weakness (generalized) ? ?Pain in left ankle and joints of left foot ? ?Other abnormalities of gait and mobility ? ?Pain in right  ankle and joints of right foot ? ? ? ? ?Problem List ?Patient Active Problem List  ? Diagnosis Date Noted  ? Preventative health care 10/26/2021  ? Pressure injury of skin of sacral region 08/10/2021  ? Acu

## 2021-11-12 ENCOUNTER — Other Ambulatory Visit: Payer: Self-pay

## 2021-11-12 ENCOUNTER — Ambulatory Visit: Payer: BC Managed Care – PPO | Admitting: Physical Therapy

## 2021-11-12 DIAGNOSIS — M6281 Muscle weakness (generalized): Secondary | ICD-10-CM | POA: Diagnosis not present

## 2021-11-12 DIAGNOSIS — M25571 Pain in right ankle and joints of right foot: Secondary | ICD-10-CM | POA: Diagnosis not present

## 2021-11-12 DIAGNOSIS — G2589 Other specified extrapyramidal and movement disorders: Secondary | ICD-10-CM

## 2021-11-12 DIAGNOSIS — R2689 Other abnormalities of gait and mobility: Secondary | ICD-10-CM

## 2021-11-12 DIAGNOSIS — M25512 Pain in left shoulder: Secondary | ICD-10-CM

## 2021-11-12 DIAGNOSIS — Z09 Encounter for follow-up examination after completed treatment for conditions other than malignant neoplasm: Secondary | ICD-10-CM | POA: Diagnosis not present

## 2021-11-12 DIAGNOSIS — M25572 Pain in left ankle and joints of left foot: Secondary | ICD-10-CM | POA: Diagnosis not present

## 2021-11-12 NOTE — Therapy (Signed)
[facility] [place] [address] [place], [place], [address] Phone: [phone]   Fax:  [phone]  Physical Therapy Treatment  Patient Details  Name: Kathryn Boyer MRN: [id number] Date of Birth: 05-13-1956 Referring Provider (PT): Dr Myrene Galas; Dr Da Everardo Pacific   Encounter Date: 11/12/2021   PT End of Session - 11/12/21 1015     Visit Number 14    Number of Visits 30    Date for PT Re-Evaluation 01/02/22    Authorization Type BCBS    PT Start Time 1015    PT Stop Time 1100    PT Time Calculation (min) 45 min    Activity Tolerance Patient tolerated treatment well    Behavior During Therapy [facility] for tasks assessed/performed             Past Medical History:  Diagnosis Date   Anxiety    Asthma    Hyperlipidemia    Hypertension    Plantar fasciitis    Thyroid disease     Past Surgical History:  Procedure Laterality Date   ABDOMINAL HYSTERECTOMY     BREAST SURGERY     NOSE SURGERY     ORIF ANKLE FRACTURE Left 07/23/2021   Procedure: OPEN REDUCTION INTERNAL FIXATION (ORIF) ANKLE FRACTURE;  Surgeon: Myrene Galas, MD;  Location: [facility] OR;  Service: Orthopedics;  Laterality: Left;   REVERSE SHOULDER ARTHROPLASTY Left 07/25/2021   Procedure: LEFT REVERSE SHOULDER ARTHROPLASTY;  Surgeon: Bjorn Pippin, MD;  Location: [facility] OR;  Service: Orthopedics;  Laterality: Left;    There were no vitals filed for this visit.   Subjective Assessment - 11/12/21 1306     Subjective Pt notes increased L foot/ankle tightness and pain after driving 2.5 hours to [place] and then 2.5 hrs back. Has concerns about how much advil she has to take daily to keep her foot pain manageable.    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 Yes    Pain Score 4     Pain Location Leg    Pain Orientation Left    Pain Type  Acute pain    Pain Onset More than a month ago    Pain Score 4    Pain Location Shoulder    Pain Orientation Left                [facility] PT Assessment - 11/12/21 0001       Assessment   Medical Diagnosis Lt reverse TSA; Lt/Rt ankle fractures    Referring Provider (PT) Dr Myrene Galas; Dr Da Everardo Pacific    Onset Date/Surgical Date 07/20/21    Hand Dominance Right    Next MD Visit 11/14/21                           [facility] Adult PT Treatment/Exercise - 11/12/21 0001       Shoulder Exercises: Sidelying   External Rotation Strengthening;Left;20 reps    External Rotation Weight (lbs) 1    Internal Rotation Strengthening;Left;20 reps    Internal Rotation Weight (lbs) 1    Flexion Strengthening;20 reps;Weights    Flexion Weight (lbs) 1    ABduction Strengthening;20 reps;Weights    ABduction Weight (lbs) 1      Shoulder Exercises: ROM/Strengthening   Other ROM/Strengthening Exercises Finger wall crawl  abduction 3x20 sec hold; flexion 3x20 sec      Shoulder Exercises: Stretch   External Rotation Stretch 30 seconds;2 reps   sidelying; demonstrated for pt how she can perform this at home   Other Shoulder Stretches sleeper stretch 2x30 sec      Manual Therapy   Soft tissue mobilization STM & TPR pecs, lats; IASTM/STM & TPR ant tib, soleus/gastroc and peroneals on L    Passive ROM shoulder flex, ext, abd, IR/ER      Ankle Exercises: Stretches   Plantar Fascia Stretch 30 seconds    Soleus Stretch 30 seconds    Gastroc Stretch 30 seconds    Other Stretch quad stretch 2x30 standing with strap                       PT Short Term Goals - 10/10/21 1300       PT SHORT TERM GOAL #1   Title Improve posture and alignment with patient to deonstrate improved upright posture with posterior shoudler girdle engaged therefore improving shoulder mobilty and function    Time 6    Period Weeks    Status New    Target Date 11/21/21      PT SHORT TERM GOAL #2    Title Increase AROM Lt shoulder to 100-110 degress flexion and scaption    Time 6    Period Weeks    Status New    Target Date 11/21/21      PT SHORT TERM GOAL #3   Title Increase AROM Lt ankle to 5 deg ankle DF and 30 deg PF    Time 6    Period Weeks    Status New    Target Date 11/21/21      PT SHORT TERM GOAL #4   Title Patient to demonstrate improved gait pattern with least assistive device for household distances of 100-120 ft    Time 6    Period Weeks    Status New    Target Date 11/21/21      PT SHORT TERM GOAL #5   Title Patient to negotiate 4 steps to enter and exit home with assistive device as needed (dies not have railing either side of steps)    Time 6    Period Weeks    Status New    Target Date 11/21/21               PT Long Term Goals - 10/10/21 1304       PT LONG TERM GOAL #1   Title Functional AROM Lt shoulder allowing patient to return to normal functional activities    Time 12    Period Weeks    Status New    Target Date 01/02/22      PT LONG TERM GOAL #2   Title 4+/5 to 5/5 strength Lt shoulder and bilat ankles    Time 12    Period Weeks    Status New    Target Date 01/02/22      PT LONG TERM GOAL #3   Title Ambulation community distances of several hundred yards without assistive device    Time 12    Period Weeks    Status New    Target Date 01/02/22      PT LONG TERM GOAL #4   Title Independent in HEP(including aquatic program as indicated)    Time 12    Period Weeks    Status New  Target Date 01/02/22      PT LONG TERM GOAL #5   Title Improve functional limitation score to 58    Time 12    Period Weeks    Status New    Target Date 01/02/22                   Plan - 11/12/21 1303     Clinical Impression Statement Worked primarily on increasing shoulder ROM. ER/IR continues to be the tightest for pt -- able to demonstrate improved self stretching techniques in sidelying. Worked on shoulder strengthening with  correct scapular movement in sidelying. Pt reports increased L foot/ankle tightness after driving 2.5 hrs to [place] and back. Provided manual work to address this. Continued report of knee pain with knee flexion -- pt to see Dr. Carola Frost for this.    Personal Factors and Comorbidities Age;Fitness;Past/Current Experience;Comorbidity 1;Comorbidity 2    Comorbidities hypertension; osteoporesis    Examination-Activity Limitations Locomotion Level;Transfers;Reach Overhead;Bend;Sit;Carry;Sleep;Squat;Dressing;Stairs;Stand;Lift;Toileting    Examination-Participation Restrictions Church;Cleaning;Community Activity;Driving;Laundry;Occupation;Meal Prep;Shop;Volunteer    Rehab Potential Good    PT Frequency 3x / week    PT Duration 12 weeks    PT Treatment/Interventions ADLs/Self Care Home Management;Aquatic Therapy;Cryotherapy;Electrical Stimulation;Iontophoresis 4mg /ml Dexamethasone;Moist Heat;Ultrasound;Functional mobility training;Gait training;Stair training;Therapeutic activities;Therapeutic exercise;Balance training;Neuromuscular re-education;Patient/family education;Manual techniques;Passive range of motion;Dry needling;Taping    PT Next Visit Plan review HEP; progress with shoulder and bilat anle rehab - deep tissue work vs DN for shoulder girdle(not discussed), PROM and stretching Rt shoulder; ROM, stretching, strengthening, gait training, stairs, progressing rehab as tolerated and per protocol; modalities as indicated    PT Home Exercise Plan [email or web address]    Consulted and Agree with Plan of Care Patient             Patient will benefit from skilled therapeutic intervention in order to improve the following deficits and impairments:  Abnormal gait, Decreased range of motion, Increased fascial restricitons, Decreased activity tolerance, Pain, Decreased balance, Impaired flexibility, Improper body mechanics, Decreased mobility, Decreased strength, Postural dysfunction  Visit Diagnosis: Acute pain of  left shoulder  Scapular dyskinesis  Muscle weakness (generalized)  Pain in left ankle and joints of left foot  Other abnormalities of gait and mobility  Pain in right ankle and joints of right foot     Problem List Patient Active Problem List   Diagnosis Date Noted   Preventative health care 10/26/2021   Pressure injury of skin of sacral region 08/10/2021   Acute cystitis without hematuria 08/10/2021   Acute blood loss anemia    History of hypertension    Drug induced constipation    Postoperative pain    Critical polytrauma 07/27/2021   Humerus fracture 07/21/2021   Tibial plateau fracture, left 07/21/2021   Nondisplaced fracture of fifth metatarsal bone, right foot, initial encounter for closed fracture 07/21/2021   Nondisplaced fracture of lateral malleolus of right fibula, initial encounter for closed fracture 07/21/2021   Left trimalleolar fracture, closed, initial encounter 07/21/2021   Fall 07/20/2021   Urine frequency 04/29/2019   Chest pain 04/29/2019   Urinary frequency 04/29/2019   Post-menopausal 04/29/2019   Allergic rhinitis 10/13/2018   Mild intermittent asthma with acute exacerbation 10/13/2018   Gastroesophageal reflux disease 10/13/2018   Right hip pain 06/01/2018   Chronic low back pain 05/25/2018   Acute pain of right knee 05/25/2018   Thoracic aorta atherosclerosis (HCC) 05/25/2018   Generalized anxiety disorder 03/22/2017   Hyperlipidemia 03/22/2017   Hypothyroidism 03/22/2017   Osteoporosis 12/20/2016  Encounter for counseling 09/18/2016   Morbid obesity (HCC) 02/06/2016   Vaginal dryness 02/06/2016   Allergic rhinitis due to pollen 02/01/2016   Acute maxillary sinusitis 02/01/2016   Essential hypertension 02/01/2016   Asthma with acute exacerbation 02/01/2016   Lumbar radiculopathy, acute 11/01/2015   Osteoporosis, 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 [place], [place], [address] Phone: [phone]   Fax:  [phone]  Name: Milika Sheridan MRN: [id number] Date of Birth: [birth year]

## 2021-11-13 ENCOUNTER — Encounter: Payer: BC Managed Care – PPO | Admitting: Physical Therapy

## 2021-11-13 DIAGNOSIS — M80071D Age-related osteoporosis with current pathological fracture, right ankle and foot, subsequent encounter for fracture with routine healing: Secondary | ICD-10-CM | POA: Diagnosis not present

## 2021-11-13 DIAGNOSIS — M80062D Age-related osteoporosis with current pathological fracture, left lower leg, subsequent encounter for fracture with routine healing: Secondary | ICD-10-CM | POA: Diagnosis not present

## 2021-11-13 DIAGNOSIS — M80072S Age-related osteoporosis with current pathological fracture, left ankle and foot, sequela: Secondary | ICD-10-CM | POA: Diagnosis not present

## 2021-11-13 DIAGNOSIS — M80022D Age-related osteoporosis with current pathological fracture, left humerus, subsequent encounter for fracture with routine healing: Secondary | ICD-10-CM | POA: Diagnosis not present

## 2021-11-14 ENCOUNTER — Encounter: Payer: Self-pay | Admitting: Rehabilitative and Restorative Service Providers"

## 2021-11-14 ENCOUNTER — Other Ambulatory Visit: Payer: Self-pay

## 2021-11-14 ENCOUNTER — Ambulatory Visit: Payer: BC Managed Care – PPO | Admitting: Rehabilitative and Restorative Service Providers"

## 2021-11-14 DIAGNOSIS — M25571 Pain in right ankle and joints of right foot: Secondary | ICD-10-CM

## 2021-11-14 DIAGNOSIS — M25512 Pain in left shoulder: Secondary | ICD-10-CM

## 2021-11-14 DIAGNOSIS — R2689 Other abnormalities of gait and mobility: Secondary | ICD-10-CM | POA: Diagnosis not present

## 2021-11-14 DIAGNOSIS — M6281 Muscle weakness (generalized): Secondary | ICD-10-CM | POA: Diagnosis not present

## 2021-11-14 DIAGNOSIS — G2589 Other specified extrapyramidal and movement disorders: Secondary | ICD-10-CM | POA: Diagnosis not present

## 2021-11-14 DIAGNOSIS — M25572 Pain in left ankle and joints of left foot: Secondary | ICD-10-CM

## 2021-11-14 DIAGNOSIS — Z09 Encounter for follow-up examination after completed treatment for conditions other than malignant neoplasm: Secondary | ICD-10-CM | POA: Diagnosis not present

## 2021-11-14 NOTE — Therapy (Signed)
[facility] [place] [address] [place], [place], [address] Phone: [phone]   Fax:  [phone]  Physical Therapy Treatment  Patient Details  Name: Kathryn Boyer MRN: [id number] Date of Birth: 01-10-1956 Referring Provider (PT): Dr Myrene Galas; Dr Da Everardo Pacific   Encounter Date: 11/14/2021   PT End of Session - 11/14/21 1146     Visit Number 15    Number of Visits 30    Date for PT Re-Evaluation 01/02/22    Authorization Type BCBS    PT Start Time 1142    PT Stop Time 1230    PT Time Calculation (min) 48 min    Activity Tolerance Patient tolerated treatment well             Past Medical History:  Diagnosis Date   Anxiety    Asthma    Hyperlipidemia    Hypertension    Plantar fasciitis    Thyroid disease     Past Surgical History:  Procedure Laterality Date   ABDOMINAL HYSTERECTOMY     BREAST SURGERY     NOSE SURGERY     ORIF ANKLE FRACTURE Left 07/23/2021   Procedure: OPEN REDUCTION INTERNAL FIXATION (ORIF) ANKLE FRACTURE;  Surgeon: Myrene Galas, MD;  Location: [facility] OR;  Service: Orthopedics;  Laterality: Left;   REVERSE SHOULDER ARTHROPLASTY Left 07/25/2021   Procedure: LEFT REVERSE SHOULDER ARTHROPLASTY;  Surgeon: Bjorn Pippin, MD;  Location: [facility] OR;  Service: Orthopedics;  Laterality: Left;    There were no vitals filed for this visit.   Subjective Assessment - 11/14/21 1146     Subjective Patient reports that she was seen by MD this week - he will remove some of screws in the Lt ankle but she does not have a surgery date set but surgery will be outpatient. X-rays of knee were negative.    Currently in Pain? Yes    Pain Score 4     Pain Location Leg    Pain Orientation Left    Pain Descriptors / Indicators Aching    Pain Type Acute pain    Pain Onset More than a month ago    Pain Frequency Intermittent    Pain Score 0    Pain Location Shoulder    Pain Orientation Left                 [facility] PT Assessment - 11/14/21 0001       Assessment   Medical Diagnosis Lt reverse TSA; Lt/Rt ankle fractures    Referring Provider (PT) Dr Myrene Galas; Dr Da Everardo Pacific    Onset Date/Surgical Date 07/20/21    Hand Dominance Right    Next MD Visit 11/14/21      AROM   Right Shoulder Extension 62 Degrees    Right Shoulder Flexion 163 Degrees    Right Shoulder ABduction 155 Degrees    Right Shoulder External Rotation 90 Degrees    Left Shoulder Extension 43 Degrees    Left Shoulder Flexion 116 Degrees   scapular dyskinesis   Left Shoulder ABduction 99 Degrees   scapular dyskinesis   Left Shoulder Internal Rotation --   hand to lateral hip   Left Shoulder External Rotation 10 Degrees   elbow 90 deg flexion arm at side                          [facility] Adult PT Treatment/Exercise - 11/14/21 0001  Shoulder Exercises: Supine   Protraction Limitations protraction with retraction x 10 encouraging posterior shoulder girdle engaged    External Rotation Limitations hands behind head working on ER    Flexion Limitations active shoulder flexion with posterior shoudler girdle engaged throughout the range - x 10 reps      Shoulder Exercises: Prone   Retraction AROM;Both;5 reps    Retraction Limitations focus on keeping scapulae retracted and down    Flexion AROM;Strengthening;Left;10 reps    Flexion Limitations focus on engaging posterior shoulder girdle    Extension AROM;Strengthening;Left;10 reps    Extension Limitations focus on engaging posterior shoulder girdle    Other Prone Exercises row ~ 40 deg abd x 10 reps focus on posterior shoulder girdle control      Shoulder Exercises: Sidelying   External Rotation AROM;Strengthening;Left;20 reps    External Rotation Limitations focus on posterior shoulder girdle contraction    Flexion AROM;Strengthening;Left;10 reps    Flexion Limitations pushing patient's cane in forward arc    ABduction AROM;Strengthening;Left;10 reps     ABduction Limitations working on control of posterior shoulder girdle engaging    Other Sidelying Exercises shoulder clock working on scapular movement and posterior shoulder girdle control      Shoulder Exercises: Standing   Flexion AROM;AAROM;Both;10 reps    Flexion Limitations sliding hand up wall focus on scapular position avoiding dyskinesis as possible    Extension AROM;Strengthening;Left;10 reps    Extension Limitations holding cane      Manual Therapy   Soft tissue mobilization STM Lt shoulder girdle working through the medial scapular area as well as upper traps and pecs    Passive ROM shoulder flex, ext;  abd, IR/ER in scapular plane                       PT Short Term Goals - 10/10/21 1300       PT SHORT TERM GOAL #1   Title Improve posture and alignment with patient to deonstrate improved upright posture with posterior shoudler girdle engaged therefore improving shoulder mobilty and function    Time 6    Period Weeks    Status New    Target Date 11/21/21      PT SHORT TERM GOAL #2   Title Increase AROM Lt shoulder to 100-110 degress flexion and scaption    Time 6    Period Weeks    Status New    Target Date 11/21/21      PT SHORT TERM GOAL #3   Title Increase AROM Lt ankle to 5 deg ankle DF and 30 deg PF    Time 6    Period Weeks    Status New    Target Date 11/21/21      PT SHORT TERM GOAL #4   Title Patient to demonstrate improved gait pattern with least assistive device for household distances of 100-120 ft    Time 6    Period Weeks    Status New    Target Date 11/21/21      PT SHORT TERM GOAL #5   Title Patient to negotiate 4 steps to enter and exit home with assistive device as needed (dies not have railing either side of steps)    Time 6    Period Weeks    Status New    Target Date 11/21/21               PT Long Term Goals - 10/10/21 1304  PT LONG TERM GOAL #1   Title Functional AROM Lt shoulder allowing patient to  return to normal functional activities    Time 12    Period Weeks    Status New    Target Date 01/02/22      PT LONG TERM GOAL #2   Title 4+/5 to 5/5 strength Lt shoulder and bilat ankles    Time 12    Period Weeks    Status New    Target Date 01/02/22      PT LONG TERM GOAL #3   Title Ambulation community distances of several hundred yards without assistive device    Time 12    Period Weeks    Status New    Target Date 01/02/22      PT LONG TERM GOAL #4   Title Independent in HEP(including aquatic program as indicated)    Time 12    Period Weeks    Status New    Target Date 01/02/22      PT LONG TERM GOAL #5   Title Improve functional limitation score to 58    Time 12    Period Weeks    Status New    Target Date 01/02/22                   Plan - 11/14/21 1148     Clinical Impression Statement Continued pain in the Lt ankle - MD will schedule removal of some of the hardware in Lt ankle date to be determined. Knee pain continues but xrays are negative. Patient refused cortisone injection Lt knee but may have that when she has ankle surgery. Shoulder continues to be limited at end ranges with notable scapular dyskinesis. Working on scapular control with exercises in clinic today.    Rehab Potential Good    PT Frequency 3x / week    PT Duration 12 weeks    PT Treatment/Interventions ADLs/Self Care Home Management;Aquatic Therapy;Cryotherapy;Electrical Stimulation;Iontophoresis 4mg /ml Dexamethasone;Moist Heat;Ultrasound;Functional mobility training;Gait training;Stair training;Therapeutic activities;Therapeutic exercise;Balance training;Neuromuscular re-education;Patient/family education;Manual techniques;Passive range of motion;Dry needling;Taping    PT Next Visit Plan review HEP; progress with shoulder and bilat anle rehab - deep tissue work vs DN for shoulder girdle(not discussed), PROM and stretching Rt shoulder; ROM, stretching, strengthening, gait training,  stairs, progressing rehab as tolerated and per protocol; modalities as indicated    PT Home Exercise Plan [id number]    Consulted and Agree with Plan of Care Patient             Patient will benefit from skilled therapeutic intervention in order to improve the following deficits and impairments:     Visit Diagnosis: Acute pain of left shoulder  Scapular dyskinesis  Muscle weakness (generalized)  Pain in left ankle and joints of left foot  Other abnormalities of gait and mobility  Pain in right ankle and joints of right foot     Problem List Patient Active Problem List   Diagnosis Date Noted   Preventative health care 10/26/2021   Pressure injury of skin of sacral region 08/10/2021   Acute cystitis without hematuria 08/10/2021   Acute blood loss anemia    History of hypertension    Drug induced constipation    Postoperative pain    Critical polytrauma 07/27/2021   Humerus fracture 07/21/2021   Tibial plateau fracture, left 07/21/2021   Nondisplaced fracture of fifth metatarsal bone, right foot, initial encounter for closed fracture 07/21/2021   Nondisplaced fracture of lateral malleolus of right fibula, initial  encounter for closed fracture 07/21/2021   Left trimalleolar fracture, closed, initial encounter 07/21/2021   Fall 07/20/2021   Urine frequency 04/29/2019   Chest pain 04/29/2019   Urinary frequency 04/29/2019   Post-menopausal 04/29/2019   Allergic rhinitis 10/13/2018   Mild intermittent asthma with acute exacerbation 10/13/2018   Gastroesophageal reflux disease 10/13/2018   Right hip pain 06/01/2018   Chronic low back pain 05/25/2018   Acute pain of right knee 05/25/2018   Thoracic aorta atherosclerosis (HCC) 05/25/2018   Generalized anxiety disorder 03/22/2017   Hyperlipidemia 03/22/2017   Hypothyroidism 03/22/2017   Osteoporosis 12/20/2016   Encounter for counseling 09/18/2016   Morbid obesity (HCC) 02/06/2016   Vaginal dryness 02/06/2016    Allergic rhinitis due to pollen 02/01/2016   Acute maxillary sinusitis 02/01/2016   Essential hypertension 02/01/2016   Asthma with acute exacerbation 02/01/2016   Lumbar radiculopathy, acute 11/01/2015   Osteoporosis, 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   Adjustment disorder with anxiety 02/08/2015   Abnormal weight gain 02/08/2015    Mylie Mccurley Rober Minion, PT, MPH 11/14/2021, 12:43 PM  [facility] [address] [place], [place], [address] Phone: [phone]   Fax:  [phone]  Name: Kathryn Boyer MRN: [id number] Date of Birth: 09/06/1955

## 2021-11-15 ENCOUNTER — Other Ambulatory Visit: Payer: Self-pay | Admitting: Orthopedic Surgery

## 2021-11-15 DIAGNOSIS — S82852D Displaced trimalleolar fracture of left lower leg, subsequent encounter for closed fracture with routine healing: Secondary | ICD-10-CM

## 2021-11-16 ENCOUNTER — Encounter: Payer: Self-pay | Admitting: Rehabilitative and Restorative Service Providers"

## 2021-11-16 ENCOUNTER — Other Ambulatory Visit: Payer: Self-pay

## 2021-11-16 ENCOUNTER — Ambulatory Visit: Payer: BC Managed Care – PPO | Admitting: Rehabilitative and Restorative Service Providers"

## 2021-11-16 DIAGNOSIS — M25512 Pain in left shoulder: Secondary | ICD-10-CM | POA: Diagnosis not present

## 2021-11-16 DIAGNOSIS — M6281 Muscle weakness (generalized): Secondary | ICD-10-CM | POA: Diagnosis not present

## 2021-11-16 DIAGNOSIS — M25572 Pain in left ankle and joints of left foot: Secondary | ICD-10-CM

## 2021-11-16 DIAGNOSIS — Z09 Encounter for follow-up examination after completed treatment for conditions other than malignant neoplasm: Secondary | ICD-10-CM | POA: Diagnosis not present

## 2021-11-16 DIAGNOSIS — M25571 Pain in right ankle and joints of right foot: Secondary | ICD-10-CM | POA: Diagnosis not present

## 2021-11-16 DIAGNOSIS — R2689 Other abnormalities of gait and mobility: Secondary | ICD-10-CM | POA: Diagnosis not present

## 2021-11-16 DIAGNOSIS — G2589 Other specified extrapyramidal and movement disorders: Secondary | ICD-10-CM | POA: Diagnosis not present

## 2021-11-16 NOTE — Therapy (Signed)
[facility] ?[address] ?[place], [place], [address] ?Phone: [phone]   Fax:  [phone] ? ?Physical Therapy Treatment ? ?Patient Details  ?Name: Kathryn BachelorDiane Boyer ?MRN: [id number] ?Date of Birth: 1955-11-03 ?Referring Provider (PT): Dr Myrene GalasMichael Handy; Dr Da Everardo PacificVarkey ? ? ?Encounter Date: 11/16/2021 ? ? PT End of Session - 11/16/21 1016   ? ? Visit Number 16   ? Number of Visits 30   ? Date for PT Re-Evaluation 01/02/22   ? Authorization Type BCBS   ? PT Start Time 0400   ? PT Stop Time 1100   ? PT Time Calculation (min) 420 min   ? Activity Tolerance Patient tolerated treatment well   ? ?  ?  ? ?  ? ? ?Past Medical History:  ?Diagnosis Date  ? Anxiety   ? Asthma   ? Hyperlipidemia   ? Hypertension   ? Plantar fasciitis   ? Thyroid disease   ? ? ?Past Surgical History:  ?Procedure Laterality Date  ? ABDOMINAL HYSTERECTOMY    ? BREAST SURGERY    ? NOSE SURGERY    ? ORIF ANKLE FRACTURE Left 07/23/2021  ? Procedure: OPEN REDUCTION INTERNAL FIXATION (ORIF) ANKLE FRACTURE;  Surgeon: Myrene GalasHandy, Michael, MD;  Location: [facility] OR;  Service: Orthopedics;  Laterality: Left;  ? REVERSE SHOULDER ARTHROPLASTY Left 07/25/2021  ? Procedure: LEFT REVERSE SHOULDER ARTHROPLASTY;  Surgeon: Bjorn PippinVarkey, Dax T, MD;  Location: [facility] OR;  Service: Orthopedics;  Laterality: Left;  ? ? ?There were no vitals filed for this visit. ? ? Subjective Assessment - 11/16/21 1016   ? ? Subjective Shoulder is hurting some today. Working in [employer]. Still has not scheduled for the removal of Lt ankle hardware. Knee still hurts.   ? Pertinent History ORIF Lt ankle 07/23/21; reverse TSA 07/25/21   ? Currently in Pain? No/denies   ? Pain Score 3    ? Pain Location Leg   ? Pain Orientation Left   ? Pain Descriptors / Indicators Aching   ? Pain Score 3   ? Pain Location Shoulder   ? Pain Orientation Left   ? Pain Descriptors / Indicators Aching   ? Pain Type Acute pain   ? Pain Onset More than a month ago   ? ?  ?  ? ?   ? ? ? ? ? ? ? ? ? ? ? ? ? ? ? ? ? ? ? ? [facility] Adult PT Treatment/Exercise - 11/16/21 0001   ? ?  ? Neuro Re-ed   ? Neuro Re-ed Details  working on scapular control with active movement in the Lt UE   ?  ? Knee/Hip Exercises: Stretches  ? Gastroc Stretch Left;30 seconds;3 reps   ? Gastroc Stretch Limitations standing pushing hips forward   ? Other Knee/Hip Stretches forefoot stretch ball of foot resting on incline board 20 sec x 3 reps   ?  ? Knee/Hip Exercises: Standing  ? Wall Squat 10 reps;10 seconds   ? Wall Squat Limitations stretching ankle; working quads/gluts   ?  ? Shoulder Exercises: Sidelying  ? External Rotation AROM;Strengthening;Left;20 reps   ? External Rotation Weight (lbs) 1   ? External Rotation Limitations focus on posterior shoulder girdle contraction   ? Flexion AROM;Strengthening;Left;10 reps   ? Flexion Weight (lbs) 1   ? ABduction AROM;Strengthening;Left;10 reps   ? ABduction Weight (lbs) 1   ? ABduction Limitations working on control of posterior shoulder girdle engaging   ?  Other Sidelying Exercises shoulder clock working on scapular movement and posterior shoulder girdle control   ?  ? Shoulder Exercises: Standing  ? Flexion AROM;AAROM;Both;10 reps   ? Flexion Limitations sliding hand up wall focus on scapular position avoiding dyskinesis as possible   ? Extension AROM;Strengthening;Left;10 reps   ? Extension Limitations holding cane   ? Other Standing Exercises counter plank heels down; elbows straight 30 sec x 3 reps   ?  ? Shoulder Exercises: Stretch  ? Internal Rotation Stretch 5 reps   ? Internal Rotation Stretch Limitations cane behind back pulling across hips 10 sec hold repeated sliding Lt hand up toward waist using Rt UE to assist 5 reps x 10 sec hold   ? Wall Stretch - Flexion 5 reps;10 seconds   ? Wall Stretch - Flexion Limitations rolling ball up wall stepping under   ? Other Shoulder Stretches extension cane behind back in standing 10 sec hold x 5 reps   ?  ? Manual Therapy  ?  Soft tissue mobilization STM and IASTMLt shoulder girdle working through the medial scapular area as well as upper traps and pecs   ? Passive ROM shoulder flex, ext;  abd, IR/ER in scapular plane   ?  ? Ankle Exercises: Standing  ? Heel Raises Both;20 reps   ? Other Standing Ankle Exercises mini squat 3x10   ? Other Standing Ankle Exercises toe raises x 20 bilat   ? ?  ?  ? ?  ? ? ? ? ? ? ? ? ? ? ? ? PT Short Term Goals - 10/10/21 1300   ? ?  ? PT SHORT TERM GOAL #1  ? Title Improve posture and alignment with patient to deonstrate improved upright posture with posterior shoudler girdle engaged therefore improving shoulder mobilty and function   ? Time 6   ? Period Weeks   ? Status New   ? Target Date 11/21/21   ?  ? PT SHORT TERM GOAL #2  ? Title Increase AROM Lt shoulder to 100-110 degress flexion and scaption   ? Time 6   ? Period Weeks   ? Status New   ? Target Date 11/21/21   ?  ? PT SHORT TERM GOAL #3  ? Title Increase AROM Lt ankle to 5 deg ankle DF and 30 deg PF   ? Time 6   ? Period Weeks   ? Status New   ? Target Date 11/21/21   ?  ? PT SHORT TERM GOAL #4  ? Title Patient to demonstrate improved gait pattern with least assistive device for household distances of 100-120 ft   ? Time 6   ? Period Weeks   ? Status New   ? Target Date 11/21/21   ?  ? PT SHORT TERM GOAL #5  ? Title Patient to negotiate 4 steps to enter and exit home with assistive device as needed (dies not have railing either side of steps)   ? Time 6   ? Period Weeks   ? Status New   ? Target Date 11/21/21   ? ?  ?  ? ?  ? ? ? ? PT Long Term Goals - 10/10/21 1304   ? ?  ? PT LONG TERM GOAL #1  ? Title Functional AROM Lt shoulder allowing patient to return to normal functional activities   ? Time 12   ? Period Weeks   ? Status New   ? Target Date 01/02/22   ?  ?  PT LONG TERM GOAL #2  ? Title 4+/5 to 5/5 strength Lt shoulder and bilat ankles   ? Time 12   ? Period Weeks   ? Status New   ? Target Date 01/02/22   ?  ? PT LONG TERM GOAL #3  ? Title  Ambulation community distances of several hundred yards without assistive device   ? Time 12   ? Period Weeks   ? Status New   ? Target Date 01/02/22   ?  ? PT LONG TERM GOAL #4  ? Title Independent in HEP(including aquatic program as indicated)   ? Time 12   ? Period Weeks   ? Status New   ? Target Date 01/02/22   ?  ? PT LONG TERM GOAL #5  ? Title Improve functional limitation score to 58   ? Time 12   ? Period Weeks   ? Status New   ? Target Date 01/02/22   ? ?  ?  ? ?  ? ? ? ? ? ? ? ? Plan - 11/16/21 1019   ? ? Clinical Impression Statement Patient continues to have pain in the Lt shoulder, ankle and knee. She reports that she is working in [employer] and working on exercises at home. Can tell she is making progress. Patient will have hardware in Lt ankle removed when it is scheduled.   ? Rehab Potential Good   ? PT Frequency 3x / week   ? PT Duration 12 weeks   ? PT Treatment/Interventions ADLs/Self Care Home Management;Aquatic Therapy;Cryotherapy;Electrical Stimulation;Iontophoresis 4mg /ml Dexamethasone;Moist Heat;Ultrasound;Functional mobility training;Gait training;Stair training;Therapeutic activities;Therapeutic exercise;Balance training;Neuromuscular re-education;Patient/family education;Manual techniques;Passive range of motion;Dry needling;Taping   ? PT Next Visit Plan review HEP; progress with shoulder and bilat ankle rehab - deep tissue work vs DN for shoulder girdle(not discussed), PROM and stretching Rt shoulder; ROM, stretching, strengthening, gait training, stairs, progressing rehab as tolerated and per protocol; modalities as indicated   ? PT Home Exercise Plan   ? Consulted and Agree with Plan of Care Patient   ? ?  ?  ? ?  ? ? ?Patient will benefit from skilled therapeutic intervention in order to improve the following deficits and impairments:    ? ?Visit Diagnosis: ?Acute pain of left shoulder ? ?Scapular dyskinesis ? ?Muscle weakness (generalized) ? ?Pain in left ankle and joints of  left foot ? ?Other abnormalities of gait and mobility ? ?Pain in right ankle and joints of right foot ? ? ? ? ?Problem List ?Patient Active Problem List  ? Diagnosis Date Noted  ? Preventative health c

## 2021-11-19 ENCOUNTER — Ambulatory Visit: Payer: BC Managed Care – PPO | Admitting: Physical Therapy

## 2021-11-19 ENCOUNTER — Other Ambulatory Visit: Payer: Self-pay

## 2021-11-19 DIAGNOSIS — R2689 Other abnormalities of gait and mobility: Secondary | ICD-10-CM | POA: Diagnosis not present

## 2021-11-19 DIAGNOSIS — M25571 Pain in right ankle and joints of right foot: Secondary | ICD-10-CM | POA: Diagnosis not present

## 2021-11-19 DIAGNOSIS — M6281 Muscle weakness (generalized): Secondary | ICD-10-CM

## 2021-11-19 DIAGNOSIS — M25512 Pain in left shoulder: Secondary | ICD-10-CM | POA: Diagnosis not present

## 2021-11-19 DIAGNOSIS — Z09 Encounter for follow-up examination after completed treatment for conditions other than malignant neoplasm: Secondary | ICD-10-CM | POA: Diagnosis not present

## 2021-11-19 DIAGNOSIS — M25572 Pain in left ankle and joints of left foot: Secondary | ICD-10-CM | POA: Diagnosis not present

## 2021-11-19 DIAGNOSIS — G2589 Other specified extrapyramidal and movement disorders: Secondary | ICD-10-CM

## 2021-11-19 NOTE — Therapy (Signed)
[place] [facility] ?[address] ?[place], [place], [address] ?Phone: [phone]   Fax:  [phone] ? ?Physical Therapy Treatment ? ?Patient Details  ?Name: Kathryn Boyer ?MRN: [id number] ?Date of Birth: 1956/06/11 ?Referring Provider (PT): Dr Myrene Galas; Dr Da Everardo Pacific ? ? ?Encounter Date: 11/19/2021 ? ? PT End of Session - 11/19/21 1015   ? ? Visit Number 17   ? Number of Visits 30   ? Date for PT Re-Evaluation 01/02/22   ? Authorization Type BCBS   ? PT Start Time 1015   ? PT Stop Time 1100   ? PT Time Calculation (min) 45 min   ? Activity Tolerance Patient tolerated treatment well   ? Behavior During Therapy [facility] for tasks assessed/performed   ? ?  ?  ? ?  ? ? ?Past Medical History:  ?Diagnosis Date  ? Anxiety   ? Asthma   ? Hyperlipidemia   ? Hypertension   ? Plantar fasciitis   ? Thyroid disease   ? ? ?Past Surgical History:  ?Procedure Laterality Date  ? ABDOMINAL HYSTERECTOMY    ? BREAST SURGERY    ? NOSE SURGERY    ? ORIF ANKLE FRACTURE Left 07/23/2021  ? Procedure: OPEN REDUCTION INTERNAL FIXATION (ORIF) ANKLE FRACTURE;  Surgeon: Myrene Galas, MD;  Location: [facility];  Service: Orthopedics;  Laterality: Left;  ? REVERSE SHOULDER ARTHROPLASTY Left 07/25/2021  ? Procedure: LEFT REVERSE SHOULDER ARTHROPLASTY;  Surgeon: Bjorn Pippin, MD;  Location: [facility];  Service: Orthopedics;  Laterality: Left;  ? ? ?There were no vitals filed for this visit. ? ? Subjective Assessment - 11/19/21 1018   ? ? Subjective Pt states ankle feels stiff this morning.   ? Pertinent History ORIF Lt ankle 07/23/21; reverse TSA 07/25/21   ? Patient Stated Goals get shoulder working again; fix her on hair; be able to walk well   ? Currently in Pain? No/denies   ? Pain Score 3    ? Pain Orientation Left   ? Pain Descriptors / Indicators Aching   ? Pain Onset More than a month ago   ? ?  ?  ? ?  ? ? ? ? ? [facility] PT Assessment - 11/19/21 0001   ? ?  ? Assessment  ? Medical Diagnosis Lt reverse  TSA; Lt/Rt ankle fractures   ? Referring Provider (PT) Dr Myrene Galas; Dr Da Everardo Pacific   ? Onset Date/Surgical Date 07/20/21   ? Hand Dominance Right   ? Next MD Visit 11/14/21   ? ?  ?  ? ?  ? ? ? ? ? ? ? ? ? ? ? ? ? ? ? ? [facility] Adult PT Treatment/Exercise - 11/19/21 0001   ? ?  ? Knee/Hip Exercises: Stretches  ? Gastroc Stretch 30 seconds;Both;3 reps   ? Soleus Stretch Both;2 reps;30 seconds   ?  ? Knee/Hip Exercises: Machines for Strengthening  ? Cybex Knee Extension 2 plates 6C12 DL   ?  ? Knee/Hip Exercises: Standing  ? Hip Abduction Stengthening;2 sets;10 reps   ? Abduction Limitations green tband   ? Hip Extension Stengthening;2 sets;10 reps   ? Extension Limitations green tband   ? Wall Squat 10 reps;10 seconds   ? Wall Squat Limitations stretching ankle; working quads/gluts   ?  ? Shoulder Exercises: Sidelying  ? External Rotation AROM;10 reps   ? Flexion AROM;Left;10 reps   ? ABduction AROM;Left   ? ABduction Limitations working on control of  posterior shoulder girdle engaging   ? Other Sidelying Exercises shoulder clock working on scapular movement and posterior shoulder girdle control   ?  ? Shoulder Exercises: Stretch  ? Other Shoulder Stretches sleeper stretch 2x30 sec, sidelying ER 2x30 sec   ? ?  ?  ? ?  ? ? ? ? ? ? ? ? ? ? ? ? PT Short Term Goals - 10/10/21 1300   ? ?  ? PT SHORT TERM GOAL #1  ? Title Improve posture and alignment with patient to deonstrate improved upright posture with posterior shoudler girdle engaged therefore improving shoulder mobilty and function   ? Time 6   ? Period Weeks   ? Status New   ? Target Date 11/21/21   ?  ? PT SHORT TERM GOAL #2  ? Title Increase AROM Lt shoulder to 100-110 degress flexion and scaption   ? Time 6   ? Period Weeks   ? Status New   ? Target Date 11/21/21   ?  ? PT SHORT TERM GOAL #3  ? Title Increase AROM Lt ankle to 5 deg ankle DF and 30 deg PF   ? Time 6   ? Period Weeks   ? Status New   ? Target Date 11/21/21   ?  ? PT SHORT TERM GOAL #4  ? Title  Patient to demonstrate improved gait pattern with least assistive device for household distances of 100-120 ft   ? Time 6   ? Period Weeks   ? Status New   ? Target Date 11/21/21   ?  ? PT SHORT TERM GOAL #5  ? Title Patient to negotiate 4 steps to enter and exit home with assistive device as needed (dies not have railing either side of steps)   ? Time 6   ? Period Weeks   ? Status New   ? Target Date 11/21/21   ? ?  ?  ? ?  ? ? ? ? PT Long Term Goals - 10/10/21 1304   ? ?  ? PT LONG TERM GOAL #1  ? Title Functional AROM Lt shoulder allowing patient to return to normal functional activities   ? Time 12   ? Period Weeks   ? Status New   ? Target Date 01/02/22   ?  ? PT LONG TERM GOAL #2  ? Title 4+/5 to 5/5 strength Lt shoulder and bilat ankles   ? Time 12   ? Period Weeks   ? Status New   ? Target Date 01/02/22   ?  ? PT LONG TERM GOAL #3  ? Title Ambulation community distances of several hundred yards without assistive device   ? Time 12   ? Period Weeks   ? Status New   ? Target Date 01/02/22   ?  ? PT LONG TERM GOAL #4  ? Title Independent in HEP(including aquatic program as indicated)   ? Time 12   ? Period Weeks   ? Status New   ? Target Date 01/02/22   ?  ? PT LONG TERM GOAL #5  ? Title Improve functional limitation score to 58   ? Time 12   ? Period Weeks   ? Status New   ? Target Date 01/02/22   ? ?  ?  ? ?  ? ? ? ? ? ? ? ? Plan - 11/19/21 1032   ? ? Clinical Impression Statement Treatment focused on hip, knee, and ankle strengthening.  Continued to work on emphasizing scapular control during shoulder movement.   ? Personal Factors and Comorbidities Age;Fitness;Past/Current Experience;Comorbidity 1;Comorbidity 2   ? Comorbidities hypertension; osteoporesis   ? Examination-Activity Limitations Locomotion Level;Transfers;Reach Overhead;Bend;Sit;Carry;Sleep;Squat;Dressing;Stairs;Stand;Lift;Toileting   ? Examination-Participation Restrictions Church;Cleaning;Community Activity;Driving;Laundry;Occupation;Meal  Prep;Shop;Volunteer   ? Stability/Clinical Decision Making Evolving/Moderate complexity   ? Rehab Potential Good   ? PT Frequency 3x / week   ? PT Duration 12 weeks   ? PT Treatment/Interventions ADLs/Self Care Home Management;Aquatic Therapy;Cryotherapy;Electrical Stimulation;Iontophoresis 4mg /ml Dexamethasone;Moist Heat;Ultrasound;Functional mobility training;Gait training;Stair training;Therapeutic activities;Therapeutic exercise;Balance training;Neuromuscular re-education;Patient/family education;Manual techniques;Passive range of motion;Dry needling;Taping   ? PT Next Visit Plan review HEP; progress with shoulder and bilat ankle rehab - deep tissue work vs DN for shoulder girdle(not discussed), PROM and stretching Rt shoulder; ROM, stretching, strengthening, gait training, stairs, progressing rehab as tolerated and per protocol; modalities as indicated   ? PT Home Exercise Plan   ? Consulted and Agree with Plan of Care Patient   ? ?  ?  ? ?  ? ? ?Patient will benefit from skilled therapeutic intervention in order to improve the following deficits and impairments:  Abnormal gait, Decreased range of motion, Increased fascial restricitons, Decreased activity tolerance, Pain, Decreased balance, Impaired flexibility, Improper body mechanics, Decreased mobility, Decreased strength, Postural dysfunction ? ?Visit Diagnosis: ?Acute pain of left shoulder ? ?Scapular dyskinesis ? ?Muscle weakness (generalized) ? ?Pain in left ankle and joints of left foot ? ?Other abnormalities of gait and mobility ? ?Pain in right ankle and joints of right foot ? ? ? ? ?Problem List ?Patient Active Problem List  ? Diagnosis Date Noted  ? Preventative health care 10/26/2021  ? Pressure injury of skin of sacral region 08/10/2021  ? Acute cystitis without hematuria 08/10/2021  ? Acute blood loss anemia   ? History of hypertension   ? Drug induced constipation   ? Postoperative pain   ? Critical polytrauma 07/27/2021  ? Humerus  fracture 07/21/2021  ? Tibial plateau fracture, left 07/21/2021  ? Nondisplaced fracture of fifth metatarsal bone, right foot, initial encounter for closed fracture 07/21/2021  ? Nondisplaced fracture of later

## 2021-11-20 ENCOUNTER — Encounter: Payer: BC Managed Care – PPO | Admitting: Physical Therapy

## 2021-11-21 ENCOUNTER — Ambulatory Visit: Payer: BC Managed Care – PPO | Admitting: Rehabilitative and Restorative Service Providers"

## 2021-11-21 ENCOUNTER — Encounter: Payer: Self-pay | Admitting: Rehabilitative and Restorative Service Providers"

## 2021-11-21 ENCOUNTER — Other Ambulatory Visit: Payer: Self-pay

## 2021-11-21 DIAGNOSIS — M25572 Pain in left ankle and joints of left foot: Secondary | ICD-10-CM | POA: Diagnosis not present

## 2021-11-21 DIAGNOSIS — G2589 Other specified extrapyramidal and movement disorders: Secondary | ICD-10-CM | POA: Diagnosis not present

## 2021-11-21 DIAGNOSIS — M25512 Pain in left shoulder: Secondary | ICD-10-CM

## 2021-11-21 DIAGNOSIS — M6281 Muscle weakness (generalized): Secondary | ICD-10-CM

## 2021-11-21 DIAGNOSIS — M25571 Pain in right ankle and joints of right foot: Secondary | ICD-10-CM

## 2021-11-21 DIAGNOSIS — R2689 Other abnormalities of gait and mobility: Secondary | ICD-10-CM | POA: Diagnosis not present

## 2021-11-21 DIAGNOSIS — Z09 Encounter for follow-up examination after completed treatment for conditions other than malignant neoplasm: Secondary | ICD-10-CM | POA: Diagnosis not present

## 2021-11-21 NOTE — Therapy (Signed)
[place] [facility] ?[address] [place] [address] ?[place], [place], [address] ?Phone: [phone]   Fax:  [phone] ? ?Physical Therapy Treatment ? ?Patient Details  ?Name: Kathryn Boyer ?MRN: [id number] ?Date of Birth: April 21, 1956 ?Referring Provider (PT): Dr Myrene Galas; Dr Da Everardo [place] ? ? ?Encounter Date: 11/21/2021 ? ? PT End of Session - 11/21/21 1018   ? ? Visit Number 18   ? Number of Visits 30   ? Date for PT Re-Evaluation 01/02/22   ? Authorization Type BCBS   ? PT Start Time 1015   ? PT Stop Time 1100   ? PT Time Calculation (min) 45 min   ? Activity Tolerance Patient tolerated treatment well   ? ?  ?  ? ?  ? ? ?Past Medical History:  ?Diagnosis Date  ? Anxiety   ? Asthma   ? Hyperlipidemia   ? Hypertension   ? Plantar fasciitis   ? Thyroid disease   ? ? ?Past Surgical History:  ?Procedure Laterality Date  ? ABDOMINAL HYSTERECTOMY    ? BREAST SURGERY    ? NOSE SURGERY    ? ORIF ANKLE FRACTURE Left 07/23/2021  ? Procedure: OPEN REDUCTION INTERNAL FIXATION (ORIF) ANKLE FRACTURE;  Surgeon: Myrene Galas, MD;  Location: [facility];  Service: Orthopedics;  Laterality: Left;  ? REVERSE SHOULDER ARTHROPLASTY Left 07/25/2021  ? Procedure: LEFT REVERSE SHOULDER ARTHROPLASTY;  Surgeon: Bjorn Pippin, MD;  Location: [facility];  Service: Orthopedics;  Laterality: Left;  ? ? ?There were no vitals filed for this visit. ? ? Subjective Assessment - 11/21/21 1019   ? ? Subjective Walking some without the cane. She still has not heard about scheduling surgery to remove the hardware from Lt ankle. Lt knee continues to be painful. Plans to call the MD for injection in Lt knee.   ? Currently in Pain? Yes   ? Pain Score 5    ? Pain Location Knee   ? Pain Orientation Left   ? Pain Descriptors / Indicators Aching   ? Pain Type Acute pain   ? Pain Score 3   ? Pain Location Shoulder   ? Pain Descriptors / Indicators Aching;Sore   ? ?  ?  ? ?  ? ? ? ? ? ? ? ? ? ? ? ? ? ? ? ? ? ? ? ? [facility] Adult PT  Treatment/Exercise - 11/21/21 0001   ? ?  ? Knee/Hip Exercises: Stretches  ? Gastroc Stretch Left;2 reps;30 seconds   ? Soleus Stretch Left;2 reps;30 seconds   ?  ? Shoulder Exercises: Standing  ? Horizontal ABduction Limitations scaption x 10 reps focus on posterior shoulder girdle cotrol lifting ~ 75-80 degrees   ? External Rotation AAROM;Left;10 reps   ? External Rotation Limitations repeated AAROM using cane 10 sec hold x 10 reps   ? Flexion AROM;Both;10 reps   focus on scapular control  ? Extension AROM;Strengthening;Left;10 reps   ? Extension Limitations holding cane   ?  ? Shoulder Exercises: ROM/Strengthening  ? Over Head Lace hands behind head stretching ~ 1 min x 3 reps   ? Rhythmic Stabilization, Supine shoulders down and back shoulder flexed at 90 deg PT providing resistance in multiple directions 3-40 sec x 3 reps   ? Other ROM/Strengthening Exercises lying on noodle along spine to open chest and improve shoulder mobility ~ 2 min   ? Other ROM/Strengthening Exercises using dowel for UE exercises elbows/shoulder/wrists in various directions/movements/combinations of movements dowel in  both hands and in Lt only   ?  ? Manual Therapy  ? Soft tissue mobilization STM through the anterior shoulder/pecs   ? Passive ROM shoulder flex, ext;  abd, IR/ER in scapular plane   ? ?  ?  ? ?  ? ? ? ? ? ? ? ? ? ? ? ? PT Short Term Goals - 10/10/21 1300   ? ?  ? PT SHORT TERM GOAL #1  ? Title Improve posture and alignment with patient to deonstrate improved upright posture with posterior shoudler girdle engaged therefore improving shoulder mobilty and function   ? Time 6   ? Period Weeks   ? Status New   ? Target Date 11/21/21   ?  ? PT SHORT TERM GOAL #2  ? Title Increase AROM Lt shoulder to 100-110 degress flexion and scaption   ? Time 6   ? Period Weeks   ? Status New   ? Target Date 11/21/21   ?  ? PT SHORT TERM GOAL #3  ? Title Increase AROM Lt ankle to 5 deg ankle DF and 30 deg PF   ? Time 6   ? Period Weeks   ?  Status New   ? Target Date 11/21/21   ?  ? PT SHORT TERM GOAL #4  ? Title Patient to demonstrate improved gait pattern with least assistive device for household distances of 100-120 ft   ? Time 6   ? Period Weeks   ? Status New   ? Target Date 11/21/21   ?  ? PT SHORT TERM GOAL #5  ? Title Patient to negotiate 4 steps to enter and exit home with assistive device as needed (dies not have railing either side of steps)   ? Time 6   ? Period Weeks   ? Status New   ? Target Date 11/21/21   ? ?  ?  ? ?  ? ? ? ? PT Long Term Goals - 10/10/21 1304   ? ?  ? PT LONG TERM GOAL #1  ? Title Functional AROM Lt shoulder allowing patient to return to normal functional activities   ? Time 12   ? Period Weeks   ? Status New   ? Target Date 01/02/22   ?  ? PT LONG TERM GOAL #2  ? Title 4+/5 to 5/5 strength Lt shoulder and bilat ankles   ? Time 12   ? Period Weeks   ? Status New   ? Target Date 01/02/22   ?  ? PT LONG TERM GOAL #3  ? Title Ambulation community distances of several hundred yards without assistive device   ? Time 12   ? Period Weeks   ? Status New   ? Target Date 01/02/22   ?  ? PT LONG TERM GOAL #4  ? Title Independent in HEP(including aquatic program as indicated)   ? Time 12   ? Period Weeks   ? Status New   ? Target Date 01/02/22   ?  ? PT LONG TERM GOAL #5  ? Title Improve functional limitation score to 58   ? Time 12   ? Period Weeks   ? Status New   ? Target Date 01/02/22   ? ?  ?  ? ?  ? ? ? ? ? ? ? ? Plan - 11/21/21 1026   ? ? Clinical Impression Statement Conitnued with rehab focus on stretching and srengthening. Working on scapular stabilization/control.   ?  Rehab Potential Good   ? PT Frequency 3x / week   ? PT Duration 12 weeks   ? PT Treatment/Interventions ADLs/Self Care Home Management;Aquatic Therapy;Cryotherapy;Electrical Stimulation;Iontophoresis 4mg /ml Dexamethasone;Moist Heat;Ultrasound;Functional mobility training;Gait training;Stair training;Therapeutic activities;Therapeutic exercise;Balance  training;Neuromuscular re-education;Patient/family education;Manual techniques;Passive range of motion;Dry needling;Taping   ? PT Next Visit Plan review HEP; progress with shoulder and bilat ankle rehab - deep tissue work vs DN for shoulder girdle(not discussed), PROM and stretching Rt shoulder; ROM, stretching, strengthening, gait training, stairs, progressing rehab as tolerated and per protocol; modalities as indicated   ? PT Home Exercise Plan   ? Consulted and Agree with Plan of Care Patient   ? ?  ?  ? ?  ? ? ?Patient will benefit from skilled therapeutic intervention in order to improve the following deficits and impairments:    ? ?Visit Diagnosis: ?Acute pain of left shoulder ? ?Scapular dyskinesis ? ?Muscle weakness (generalized) ? ?Pain in left ankle and joints of left foot ? ?Other abnormalities of gait and mobility ? ?Pain in right ankle and joints of right foot ? ? ? ? ?Problem List ?Patient Active Problem List  ? Diagnosis Date Noted  ? Preventative health care 10/26/2021  ? Pressure injury of skin of sacral region 08/10/2021  ? Acute cystitis without hematuria 08/10/2021  ? Acute blood loss anemia   ? History of hypertension   ? Drug induced constipation   ? Postoperative pain   ? Critical polytrauma 07/27/2021  ? Humerus fracture 07/21/2021  ? Tibial plateau fracture, left 07/21/2021  ? Nondisplaced fracture of fifth metatarsal bone, right foot, initial encounter for closed fracture 07/21/2021  ? Nondisplaced fracture of lateral malleolus of right fibula, initial encounter for closed fracture 07/21/2021  ? Left trimalleolar fracture, closed, initial encounter 07/21/2021  ? Fall 07/20/2021  ? Urine frequency 04/29/2019  ? Chest pain 04/29/2019  ? Urinary frequency 04/29/2019  ? Post-menopausal 04/29/2019  ? Allergic rhinitis 10/13/2018  ? Mild intermittent asthma with acute exacerbation 10/13/2018  ? Gastroesophageal reflux disease 10/13/2018  ? Right hip pain 06/01/2018  ? Chronic low back  pain 05/25/2018  ? Acute pain of right knee 05/25/2018  ? Thoracic aorta atherosclerosis (HCC) 05/25/2018  ? Generalized anxiety disorder 03/22/2017  ? Hyperlipidemia 03/22/2017  ? Hypothyroidism 03/22/2017  ? Osteoporos

## 2021-11-23 ENCOUNTER — Ambulatory Visit: Payer: BC Managed Care – PPO | Admitting: Physical Therapy

## 2021-11-23 DIAGNOSIS — G2589 Other specified extrapyramidal and movement disorders: Secondary | ICD-10-CM | POA: Diagnosis not present

## 2021-11-23 DIAGNOSIS — M25512 Pain in left shoulder: Secondary | ICD-10-CM | POA: Diagnosis not present

## 2021-11-23 DIAGNOSIS — M25572 Pain in left ankle and joints of left foot: Secondary | ICD-10-CM

## 2021-11-23 DIAGNOSIS — R2689 Other abnormalities of gait and mobility: Secondary | ICD-10-CM | POA: Diagnosis not present

## 2021-11-23 DIAGNOSIS — M25571 Pain in right ankle and joints of right foot: Secondary | ICD-10-CM | POA: Diagnosis not present

## 2021-11-23 DIAGNOSIS — M6281 Muscle weakness (generalized): Secondary | ICD-10-CM

## 2021-11-23 DIAGNOSIS — Z09 Encounter for follow-up examination after completed treatment for conditions other than malignant neoplasm: Secondary | ICD-10-CM | POA: Diagnosis not present

## 2021-11-23 NOTE — Telephone Encounter (Signed)
error 

## 2021-11-23 NOTE — Therapy (Signed)
[place] ?[facility] ?[address] ?[place], [place], [address] ?Phone: [phone]   Fax:  [phone] ? ?Physical Therapy Treatment ? ?Patient Details  ?Name: Kathryn Boyer ?MRN: [id number] ?Date of Birth: [birth year] ?Referring Provider (PT): Dr Altamese Sesser; Dr Da Griffin Basil ? ? ?Encounter Date: 11/23/2021 ? ? PT End of Session - 11/23/21 1014   ? ? Visit Number 19   ? Number of Visits 30   ? Date for PT Re-Evaluation 01/02/22   ? Authorization Type BCBS   ? PT Start Time 1015   ? PT Stop Time 1100   ? PT Time Calculation (min) 45 min   ? Activity Tolerance Patient tolerated treatment well   ? Behavior During Therapy [facility] for tasks assessed/performed   ? ?  ?  ? ?  ? ? ?Past Medical History:  ?Diagnosis Date  ? Anxiety   ? Asthma   ? Hyperlipidemia   ? Hypertension   ? Plantar fasciitis   ? Thyroid disease   ? ? ?Past Surgical History:  ?Procedure Laterality Date  ? ABDOMINAL HYSTERECTOMY    ? BREAST SURGERY    ? NOSE SURGERY    ? ORIF ANKLE FRACTURE Left 07/23/2021  ? Procedure: OPEN REDUCTION INTERNAL FIXATION (ORIF) ANKLE FRACTURE;  Surgeon: Altamese [place], MD;  Location: [facility];  Service: Orthopedics;  Laterality: Left;  ? REVERSE SHOULDER ARTHROPLASTY Left 07/25/2021  ? Procedure: LEFT REVERSE SHOULDER ARTHROPLASTY;  Surgeon: Hiram Gash, MD;  Location: [facility];  Service: Orthopedics;  Laterality: Left;  ? ? ?There were no vitals filed for this visit. ? ? Subjective Assessment - 11/23/21 1017   ? ? Subjective Pt states she is going to call to get a coritsone shot for her L knee. Pt notes some R foot/ankle pain.   ? Pertinent History ORIF Lt ankle 07/23/21; reverse TSA 07/25/21   ? Patient Stated Goals get shoulder working again; fix her on hair; be able to walk well   ? ?  ?  ? ?  ? ? ? ? ? [facility] PT Assessment - 11/23/21 0001   ? ?  ? Assessment  ? Medical Diagnosis Lt reverse TSA; Lt/Rt ankle fractures   ? Referring Provider (PT) Dr Altamese ; Dr Da Griffin Basil   ?  Onset Date/Surgical Date 07/20/21   ? Hand Dominance Right   ? Next MD Visit 11/14/21   ?  ? AROM  ? Right Shoulder Flexion 117 Degrees   Seated  ?  ? PROM  ? Left Shoulder Flexion 165 Degrees   supine  ? Left Shoulder ABduction 150 Degrees   supine  ? ?  ?  ? ?  ? ? ? ? ? ? ? ? ? ? ? ? ? ? ? ? [facility] Adult PT Treatment/Exercise - 11/23/21 0001   ? ?  ? Knee/Hip Exercises: Stretches  ? Gastroc Stretch Left;30 seconds;Right   ? Soleus Stretch Left;30 seconds;Right   ? Other Knee/Hip Stretches plantar fascia stretch x 30 sec   ?  ? Knee/Hip Exercises: Machines for Strengthening  ? Total Gym Leg Press 5 plates 2x10 DL   ?  ? Knee/Hip Exercises: Standing  ? Heel Raises Both;2 sets;10 reps   ? Other Standing Knee Exercises lateral band walk red tband x3 reps; backwards monster walk red tband x3 reps   ?  ? Manual Therapy  ? Manual therapy comments gentle joint mobiliztion along R forefoot and hindfoot   ? Soft tissue  mobilization STM and TPR R plantar foot   ? Passive ROM shoulder flex, ext;  abd, IR/ER in scapular plane   ?  ? Ankle Exercises: Aerobic  ? Recumbent Bike L3 x 5 min for warm up   ? ?  ?  ? ?  ? ? ? ? ? ? ? ? ? ? ? ? PT Short Term Goals - 11/23/21 1025   ? ?  ? PT SHORT TERM GOAL #1  ? Title Improve posture and alignment with patient to deonstrate improved upright posture with posterior shoudler girdle engaged therefore improving shoulder mobilty and function   ? Baseline Requires cueing to maintain 3/31   ? Time 6   ? Period Weeks   ? Status Partially Met   ? Target Date 11/21/21   ?  ? PT SHORT TERM GOAL #2  ? Title Increase AROM Lt shoulder to 100-110 degress flexion and scaption   ? Baseline 117 deg seated AROM 3/31   ? Time 6   ? Period Weeks   ? Status Achieved   ? Target Date 11/21/21   ?  ? PT SHORT TERM GOAL #3  ? Title Increase AROM Lt ankle to 5 deg ankle DF and 30 deg PF   ? Baseline 6 deg ankle DF, 30 deg PF 3/31   ? Time 6   ? Period Weeks   ? Status Achieved   ? Target Date 11/21/21   ?  ? PT  SHORT TERM GOAL #4  ? Title Patient to demonstrate improved gait pattern with least assistive device for household distances of 100-120 ft   ? Baseline Amb with no a/d but remains antalgic with decreased L LE stance time and hip instability noted (limited by L knee pain) 3/31   ? Time 6   ? Period Weeks   ? Status Partially Met   ? Target Date 11/21/21   ?  ? PT SHORT TERM GOAL #5  ? Title Patient to negotiate 4 steps to enter and exit home with assistive device as needed (does not have railing either side of steps)   ? Baseline Able to perform without railing but mildly antalgic and utilizes step to pattern with descent 3/31   ? Time 6   ? Period Weeks   ? Status Achieved   ? Target Date 11/21/21   ? ?  ?  ? ?  ? ? ? ? PT Long Term Goals - 10/10/21 1304   ? ?  ? PT LONG TERM GOAL #1  ? Title Functional AROM Lt shoulder allowing patient to return to normal functional activities   ? Time 12   ? Period Weeks   ? Status New   ? Target Date 01/02/22   ?  ? PT LONG TERM GOAL #2  ? Title 4+/5 to 5/5 strength Lt shoulder and bilat ankles   ? Time 12   ? Period Weeks   ? Status New   ? Target Date 01/02/22   ?  ? PT LONG TERM GOAL #3  ? Title Ambulation community distances of several hundred yards without assistive device   ? Time 12   ? Period Weeks   ? Status New   ? Target Date 01/02/22   ?  ? PT LONG TERM GOAL #4  ? Title Independent in HEP(including aquatic program as indicated)   ? Time 12   ? Period Weeks   ? Status New   ? Target Date 01/02/22   ?  ?  PT LONG TERM GOAL #5  ? Title Improve functional limitation score to 58   ? Time 12   ? Period Weeks   ? Status New   ? Target Date 01/02/22   ? ?  ?  ? ?  ? ? ? ? ? ? ? ? Plan - 11/23/21 1306   ? ? Clinical Impression Statement Checked pt's STGs. She has been meeting her ROM goals at this time. She is ambulating without any a/d but remains antalgic mostly due to hip instability and continued L knee pain with weight bearing when she uses a normal gait pattern. She is  also demonstrating enough strength to ascend/descend steps without a rail but with pain. Pt will require cueing for correct form with her shoulder/postural stability exercises.   ? Personal Factors and Comorbidities Age;Fitness;Past/Current Experience;Comorbidity 1;Comorbidity 2   ? Comorbidities hypertension; osteoporesis   ? Examination-Activity Limitations Locomotion Level;Transfers;Reach Overhead;Bend;Sit;Carry;Sleep;Squat;Dressing;Stairs;Stand;Lift;Toileting   ? Examination-Participation Restrictions Church;Cleaning;Community Activity;Driving;Laundry;Occupation;Meal Prep;Shop;Volunteer   ? Rehab Potential Good   ? PT Frequency 3x / week   ? PT Duration 12 weeks   ? PT Treatment/Interventions ADLs/Self Care Home Management;Aquatic Therapy;Cryotherapy;Electrical Stimulation;Iontophoresis 90m/ml Dexamethasone;Moist Heat;Ultrasound;Functional mobility training;Gait training;Stair training;Therapeutic activities;Therapeutic exercise;Balance training;Neuromuscular re-education;Patient/family education;Manual techniques;Passive range of motion;Dry needling;Taping   ? PT Next Visit Plan review HEP; progress with shoulder and bilat ankle rehab - deep tissue work vs DN for shoulder girdle(not discussed), PROM and stretching Rt shoulder; ROM, stretching, strengthening, gait training, stairs, progressing rehab as tolerated and per protocol; modalities as indicated   ? PT Home Exercise Plan [email or web address]  ? Consulted and Agree with Plan of Care Patient   ? ?  ?  ? ?  ? ? ?Patient will benefit from skilled therapeutic intervention in order to improve the following deficits and impairments:  Abnormal gait, Decreased range of motion, Increased fascial restricitons, Decreased activity tolerance, Pain, Decreased balance, Impaired flexibility, Improper body mechanics, Decreased mobility, Decreased strength, Postural dysfunction ? ?Visit Diagnosis: ?Acute pain of left shoulder ? ?Scapular dyskinesis ? ?Muscle weakness  (generalized) ? ?Pain in left ankle and joints of left foot ? ?Other abnormalities of gait and mobility ? ?Pain in right ankle and joints of right foot ? ? ? ? ?Problem List ?Patient Active Problem List  ? Diagnosis Date N

## 2021-11-26 ENCOUNTER — Ambulatory Visit
Payer: BC Managed Care – PPO | Attending: Orthopedic Surgery | Admitting: Rehabilitative and Restorative Service Providers"

## 2021-11-26 ENCOUNTER — Encounter: Payer: Self-pay | Admitting: Rehabilitative and Restorative Service Providers"

## 2021-11-26 DIAGNOSIS — M6281 Muscle weakness (generalized): Secondary | ICD-10-CM | POA: Insufficient documentation

## 2021-11-26 DIAGNOSIS — M25572 Pain in left ankle and joints of left foot: Secondary | ICD-10-CM | POA: Diagnosis not present

## 2021-11-26 DIAGNOSIS — R2689 Other abnormalities of gait and mobility: Secondary | ICD-10-CM | POA: Diagnosis not present

## 2021-11-26 DIAGNOSIS — G2589 Other specified extrapyramidal and movement disorders: Secondary | ICD-10-CM | POA: Diagnosis not present

## 2021-11-26 DIAGNOSIS — M25571 Pain in right ankle and joints of right foot: Secondary | ICD-10-CM | POA: Diagnosis not present

## 2021-11-26 DIAGNOSIS — M25512 Pain in left shoulder: Secondary | ICD-10-CM | POA: Diagnosis not present

## 2021-11-26 NOTE — Therapy (Signed)
[facility] ?[facility] ?[address] ?[place], [place], [address] ?Phone: [phone]   Fax:  [phone] ? ?Physical Therapy Treatment ? ?Patient Details  ?Name: Kathryn Boyer ?MRN: [id number] ?Date of Birth: [birth year] ?Referring Provider (PT): Dr Altamese [place]; Dr Da Griffin Basil ? ? ?Encounter Date: 11/26/2021 ? ? PT End of Session - 11/26/21 0851   ? ? Visit Number 20   ? Number of Visits 30   ? Date for PT Re-Evaluation 01/02/22   ? Authorization Type BCBS   ? PT Start Time 0845   ? PT Stop Time 0930   ? PT Time Calculation (min) 45 min   ? Activity Tolerance Patient tolerated treatment well   ? ?  ?  ? ?  ? ? ?Past Medical History:  ?Diagnosis Date  ? Anxiety   ? Asthma   ? Hyperlipidemia   ? Hypertension   ? Plantar fasciitis   ? Thyroid disease   ? ? ?Past Surgical History:  ?Procedure Laterality Date  ? ABDOMINAL HYSTERECTOMY    ? BREAST SURGERY    ? NOSE SURGERY    ? ORIF ANKLE FRACTURE Left 07/23/2021  ? Procedure: OPEN REDUCTION INTERNAL FIXATION (ORIF) ANKLE FRACTURE;  Surgeon: Altamese Ceiba, MD;  Location: [facility];  Service: Orthopedics;  Laterality: Left;  ? REVERSE SHOULDER ARTHROPLASTY Left 07/25/2021  ? Procedure: LEFT REVERSE SHOULDER ARTHROPLASTY;  Surgeon: Hiram Gash, MD;  Location: [facility];  Service: Orthopedics;  Laterality: Left;  ? ? ?There were no vitals filed for this visit. ? ? Subjective Assessment - 11/26/21 0851   ? ? Subjective Patient reports that her lt knee pain increased after she pulled her knee up getting into bed last night. She continues to have pain in Lt ankle as well.   ? Currently in Pain? Yes   ? Pain Score 5    ? Pain Location Knee   ? Pain Orientation Left   ? Pain Descriptors / Indicators Aching   ? Pain Type Acute pain   ? Pain Onset More than a month ago   ? Pain Frequency Constant   ? Aggravating Factors  prolonged standing and walking; bending   ? Pain Relieving Factors advil; elevation LE   ? Pain Score 3   ? Pain Location  Shoulder   ? Pain Orientation Left   ? Pain Descriptors / Indicators Aching;Sore;Tightness   ? Pain Type Acute pain   ? Pain Onset More than a month ago   ? Pain Frequency Intermittent   ? Aggravating Factors  weight bearing; elevation; certain movement   ? Pain Relieving Factors advil; pain patches   ? ?  ?  ? ?  ? ? ? ? ? ? ? ? ? ? ? ? ? ? ? ? ? ? ? ? [facility] Adult PT Treatment/Exercise - 11/26/21 0001   ? ?  ? Knee/Hip Exercises: Stretches  ? Gastroc Stretch Left;30 seconds;Right;2 reps   ? Soleus Stretch Left;30 seconds;Right;2 reps   ? Other Knee/Hip Stretches plantar fascia stretch x 30 sec x 2 reps   ?  ? Knee/Hip Exercises: Machines for Strengthening  ? Total Gym Leg Press 5 plates 1x10; 6 pates 2x10 reps DL   ?  ? Knee/Hip Exercises: Standing  ? Heel Raises Both;3 sets;10 reps   repeated calf stretch after heel raises  ? Heel Raises Limitations small yellow bll btn ankles   ? Forward Lunges Left;15 reps;2 seconds   ? Forward Lunges  Limitations Lt LE forward   ? Lateral Step Up Left;5 reps;Hand Hold: 2;Step Height: 4"   ? Lateral Step Up Limitations pain in Lt knee - stopped   ? Forward Step Up Left;20 reps;Hand Hold: 2;Step Height: 6"   ? SLS 10 sec hold x 5 reps each side   ? Other Standing Knee Exercises lateral band walk red tband above knees x 10 ft 3 reps each direction; backwards monster walk red tband above knees 10 ft x3 reps   ?  ? Shoulder Exercises: Sidelying  ? Flexion AROM;Strengthening;Left;10 reps   ? Flexion Limitations pressing into ball in elevated position for 5 sec hold   ?  ? Manual Therapy  ? Manual therapy comments gentle joint mobiliztion along R forefoot and hindfoot   ? Soft tissue mobilization STM and TPR R plantar foot   ? Passive ROM shoulder flex, ext;  abd, IR/ER in scapular plane   ?  ? Ankle Exercises: Aerobic  ? Recumbent Bike L3 x 8 min for warm up   ? ?  ?  ? ?  ? ? ? ? ? ? ? ? ? ? ? ? PT Short Term Goals - 11/23/21 1025   ? ?  ? PT SHORT TERM GOAL #1  ? Title Improve  posture and alignment with patient to deonstrate improved upright posture with posterior shoudler girdle engaged therefore improving shoulder mobilty and function   ? Baseline Requires cueing to maintain 3/31   ? Time 6   ? Period Weeks   ? Status Partially Met   ? Target Date 11/21/21   ?  ? PT SHORT TERM GOAL #2  ? Title Increase AROM Lt shoulder to 100-110 degress flexion and scaption   ? Baseline 117 deg seated AROM 3/31   ? Time 6   ? Period Weeks   ? Status Achieved   ? Target Date 11/21/21   ?  ? PT SHORT TERM GOAL #3  ? Title Increase AROM Lt ankle to 5 deg ankle DF and 30 deg PF   ? Baseline 6 deg ankle DF, 30 deg PF 3/31   ? Time 6   ? Period Weeks   ? Status Achieved   ? Target Date 11/21/21   ?  ? PT SHORT TERM GOAL #4  ? Title Patient to demonstrate improved gait pattern with least assistive device for household distances of 100-120 ft   ? Baseline Amb with no a/d but remains antalgic with decreased L LE stance time and hip instability noted (limited by L knee pain) 3/31   ? Time 6   ? Period Weeks   ? Status Partially Met   ? Target Date 11/21/21   ?  ? PT SHORT TERM GOAL #5  ? Title Patient to negotiate 4 steps to enter and exit home with assistive device as needed (does not have railing either side of steps)   ? Baseline Able to perform without railing but mildly antalgic and utilizes step to pattern with descent 3/31   ? Time 6   ? Period Weeks   ? Status Achieved   ? Target Date 11/21/21   ? ?  ?  ? ?  ? ? ? ? PT Long Term Goals - 10/10/21 1304   ? ?  ? PT LONG TERM GOAL #1  ? Title Functional AROM Lt shoulder allowing patient to return to normal functional activities   ? Time 12   ? Period Weeks   ?  Status New   ? Target Date 01/02/22   ?  ? PT LONG TERM GOAL #2  ? Title 4+/5 to 5/5 strength Lt shoulder and bilat ankles   ? Time 12   ? Period Weeks   ? Status New   ? Target Date 01/02/22   ?  ? PT LONG TERM GOAL #3  ? Title Ambulation community distances of several hundred yards without assistive  device   ? Time 12   ? Period Weeks   ? Status New   ? Target Date 01/02/22   ?  ? PT LONG TERM GOAL #4  ? Title Independent in HEP(including aquatic program as indicated)   ? Time 12   ? Period Weeks   ? Status New   ? Target Date 01/02/22   ?  ? PT LONG TERM GOAL #5  ? Title Improve functional limitation score to 58   ? Time 12   ? Period Weeks   ? Status New   ? Target Date 01/02/22   ? ?  ?  ? ?  ? ? ? ? ? ? ? ? Plan - 11/26/21 0854   ? ? Clinical Impression Statement Continued pain in Lt knee and ankle as well as some tightness and soreness in the Lt shoulder. Patient is awaiting injection for Lt knee. She demonstrates antalgic gait pattern; limited ankle  and shoulder ROM/mobility. She is progressing gradually toward stated goals.   ? Rehab Potential Good   ? PT Frequency 3x / week   ? PT Duration 12 weeks   ? PT Treatment/Interventions ADLs/Self Care Home Management;Aquatic Therapy;Cryotherapy;Electrical Stimulation;Iontophoresis 18m/ml Dexamethasone;Moist Heat;Ultrasound;Functional mobility training;Gait training;Stair training;Therapeutic activities;Therapeutic exercise;Balance training;Neuromuscular re-education;Patient/family education;Manual techniques;Passive range of motion;Dry needling;Taping   ? PT Next Visit Plan review HEP; progress with shoulder and bilat ankle rehab - deep tissue work vs DN for shoulder girdle, PROM and stretching Rt shoulder; ROM, stretching, strengthening, gait training, stairs, progressing rehab as tolerated and per protocol; modalities as indicated   ? PT Home Exercise Plan [email or web address]  ? Consulted and Agree with Plan of Care Patient   ? ?  ?  ? ?  ? ? ?Patient will benefit from skilled therapeutic intervention in order to improve the following deficits and impairments:    ? ?Visit Diagnosis: ?Acute pain of left shoulder ? ?Scapular dyskinesis ? ?Muscle weakness (generalized) ? ?Pain in left ankle and joints of left foot ? ?Other abnormalities of gait and mobility ? ?Pain in  right ankle and joints of right foot ? ? ? ? ?Problem List ?Patient Active Problem List  ? Diagnosis Date Noted  ? Preventative health care 10/26/2021  ? Pressure injury of skin of sacral region 08/10/2021  ? A

## 2021-11-27 DIAGNOSIS — M25562 Pain in left knee: Secondary | ICD-10-CM | POA: Diagnosis not present

## 2021-11-28 ENCOUNTER — Encounter: Payer: Self-pay | Admitting: Rehabilitative and Restorative Service Providers"

## 2021-11-28 ENCOUNTER — Ambulatory Visit: Payer: BC Managed Care – PPO | Admitting: Rehabilitative and Restorative Service Providers"

## 2021-11-28 DIAGNOSIS — M25572 Pain in left ankle and joints of left foot: Secondary | ICD-10-CM | POA: Diagnosis not present

## 2021-11-28 DIAGNOSIS — R2689 Other abnormalities of gait and mobility: Secondary | ICD-10-CM

## 2021-11-28 DIAGNOSIS — M6281 Muscle weakness (generalized): Secondary | ICD-10-CM

## 2021-11-28 DIAGNOSIS — M25512 Pain in left shoulder: Secondary | ICD-10-CM

## 2021-11-28 DIAGNOSIS — G2589 Other specified extrapyramidal and movement disorders: Secondary | ICD-10-CM

## 2021-11-28 DIAGNOSIS — M25571 Pain in right ankle and joints of right foot: Secondary | ICD-10-CM

## 2021-11-28 NOTE — Therapy (Signed)
[facility] ?[facility] ?[place] ?[place], [place], [address] ?Phone: [phone]   Fax:  [phone] ? ?Physical Therapy Treatment ? ?Patient Details  ?Name: Kathryn Boyer ?MRN: [id number] ?Date of Birth: May 02, 1956 ?Referring Provider (PT): Dr Altamese [place]; Dr Da Griffin Basil ? ? ?Encounter Date: 11/28/2021 ? ? PT End of Session - 11/28/21 0848   ? ? Visit Number 21   ? Number of Visits 30   ? Date for PT Re-Evaluation 01/02/22   ? Authorization Type BCBS   ? PT Start Time 0845   ? PT Stop Time 0930   ? PT Time Calculation (min) 45 min   ? Activity Tolerance Patient tolerated treatment well   ? ?  ?  ? ?  ? ? ?Past Medical History:  ?Diagnosis Date  ? Anxiety   ? Asthma   ? Hyperlipidemia   ? Hypertension   ? Plantar fasciitis   ? Thyroid disease   ? ? ?Past Surgical History:  ?Procedure Laterality Date  ? ABDOMINAL HYSTERECTOMY    ? BREAST SURGERY    ? NOSE SURGERY    ? ORIF ANKLE FRACTURE Left 07/23/2021  ? Procedure: OPEN REDUCTION INTERNAL FIXATION (ORIF) ANKLE FRACTURE;  Surgeon: Altamese Hooker, MD;  Location: [facility];  Service: Orthopedics;  Laterality: Left;  ? REVERSE SHOULDER ARTHROPLASTY Left 07/25/2021  ? Procedure: LEFT REVERSE SHOULDER ARTHROPLASTY;  Surgeon: Hiram Gash, MD;  Location: [facility];  Service: Orthopedics;  Laterality: Left;  ? ? ?There were no vitals filed for this visit. ? ? Subjective Assessment - 11/28/21 0849   ? ? Subjective Patient reports that she had a cortisone shot yesterday in the Lt knee and she does have a meniscus tear on Lt. The Rt foot fracture is partially healed. She will continue to use the bone stimulator and turn the stimulator around. Patient again ask about walking on the treadmill. Discussed that she should not walk on treadmill to reinforce poor gait pattern.   ? Currently in Pain? Yes   ? Pain Score 2    ? Pain Location Knee   ? Pain Orientation Left   ? Pain Descriptors / Indicators Aching   ? Pain Type Acute pain   ?  Pain Score 0   ? Pain Location Shoulder   ? Pain Orientation Left   ? Pain Descriptors / Indicators Aching;Sore;Tightness   ? ?  ?  ? ?  ? ? ? ? ? [facility] PT Assessment - 11/28/21 0001   ? ?  ? Assessment  ? Medical Diagnosis Lt reverse TSA; Lt/Rt ankle fractures   ? Referring Provider (PT) Dr Altamese [place]; Dr Da Griffin Basil   ? Onset Date/Surgical Date 07/20/21   ? Hand Dominance Right   ? Next MD Visit 11/14/21   ? Prior Therapy in hospital; rehab; home health   ?  ? PROM  ? Left Shoulder Extension 75 Degrees   ? Left Shoulder Flexion 165 Degrees   supine  ? Left Shoulder ABduction 157 Degrees   supine in scapular plane  ? Left Shoulder External Rotation 76 Degrees   in scapular plane  ?  ? Palpation  ? Palpation comment decreasing muscular tightness through the Lt pecs, anterior deltoid, biceps, upper trap, leveator, teres   ? ?  ?  ? ?  ? ? ? ? ? ? ? ? ? ? ? ? ? ? ? ? [facility] Adult PT Treatment/Exercise - 11/28/21 0001   ? ?  ?  Knee/Hip Exercises: Stretches  ? Gastroc Stretch Left;30 seconds;Right;2 reps   ? Soleus Stretch Left;30 seconds;Right;2 reps   ? Other Knee/Hip Stretches plantar fascia stretch x 30 sec x 2 reps   ?  ? Knee/Hip Exercises: Machines for Strengthening  ? Total Gym Leg Press 7 plates 2x15 double leg; 3 plates 2x10 reps Lt LE   ?  ? Knee/Hip Exercises: Standing  ? Functional Squat 10 reps;10 seconds   ? SLS with Vectors reaching forward to tap surface ~ 18 inches x 10 reps Rt LE; touching counter top and cabinets with hands x ~ 1 min (unable to touch forward)   ? Other Standing Knee Exercises lateral band walk green tband above knees x 10 ft 5 reps each direction; backwards monster walk green tband above knees 10 ft x5 reps   ?  ? Shoulder Exercises: Sidelying  ? Flexion AROM;Strengthening;Left;10 reps   ? Flexion Limitations pressing into ball in elevated position for 5 sec hold   ?  ? Shoulder Exercises: ROM/Strengthening  ? Over Head Lace hands behind head stretching ~ 1 min x 3 reps   ?  ? Manual  Therapy  ? Scapular Mobilization Lt scapular mobs   ? Passive ROM shoulder flex, ext;  abd, IR/ER in scapular plane   ? ?  ?  ? ?  ? ? ? ? ? ? ? ? ? ? ? ? PT Short Term Goals - 11/23/21 1025   ? ?  ? PT SHORT TERM GOAL #1  ? Title Improve posture and alignment with patient to deonstrate improved upright posture with posterior shoudler girdle engaged therefore improving shoulder mobilty and function   ? Baseline Requires cueing to maintain 3/31   ? Time 6   ? Period Weeks   ? Status Partially Met   ? Target Date 11/21/21   ?  ? PT SHORT TERM GOAL #2  ? Title Increase AROM Lt shoulder to 100-110 degress flexion and scaption   ? Baseline 117 deg seated AROM 3/31   ? Time 6   ? Period Weeks   ? Status Achieved   ? Target Date 11/21/21   ?  ? PT SHORT TERM GOAL #3  ? Title Increase AROM Lt ankle to 5 deg ankle DF and 30 deg PF   ? Baseline 6 deg ankle DF, 30 deg PF 3/31   ? Time 6   ? Period Weeks   ? Status Achieved   ? Target Date 11/21/21   ?  ? PT SHORT TERM GOAL #4  ? Title Patient to demonstrate improved gait pattern with least assistive device for household distances of 100-120 ft   ? Baseline Amb with no a/d but remains antalgic with decreased L LE stance time and hip instability noted (limited by L knee pain) 3/31   ? Time 6   ? Period Weeks   ? Status Partially Met   ? Target Date 11/21/21   ?  ? PT SHORT TERM GOAL #5  ? Title Patient to negotiate 4 steps to enter and exit home with assistive device as needed (does not have railing either side of steps)   ? Baseline Able to perform without railing but mildly antalgic and utilizes step to pattern with descent 3/31   ? Time 6   ? Period Weeks   ? Status Achieved   ? Target Date 11/21/21   ? ?  ?  ? ?  ? ? ? ? PT Long  Term Goals - 10/10/21 1304   ? ?  ? PT LONG TERM GOAL #1  ? Title Functional AROM Lt shoulder allowing patient to return to normal functional activities   ? Time 12   ? Period Weeks   ? Status New   ? Target Date 01/02/22   ?  ? PT LONG TERM GOAL #2   ? Title 4+/5 to 5/5 strength Lt shoulder and bilat ankles   ? Time 12   ? Period Weeks   ? Status New   ? Target Date 01/02/22   ?  ? PT LONG TERM GOAL #3  ? Title Ambulation community distances of several hundred yards without assistive device   ? Time 12   ? Period Weeks   ? Status New   ? Target Date 01/02/22   ?  ? PT LONG TERM GOAL #4  ? Title Independent in HEP(including aquatic program as indicated)   ? Time 12   ? Period Weeks   ? Status New   ? Target Date 01/02/22   ?  ? PT LONG TERM GOAL #5  ? Title Improve functional limitation score to 58   ? Time 12   ? Period Weeks   ? Status New   ? Target Date 01/02/22   ? ?  ?  ? ?  ? ? ? ? ? ? ? ? Plan - 11/28/21 0853   ? ? Clinical Impression Statement Patient had injection in Lt knee yesterday which has helped with pain. MD stated that she does have a torn meniscus on Lt. Continued work on Lt ankle and shoulder rehab with stretching and strengthening. Patient demonstrates increasing PROM Lt shoulder and reports decreased pain and improved function.   ? Rehab Potential Good   ? PT Frequency 3x / week   ? PT Duration 12 weeks   ? PT Treatment/Interventions ADLs/Self Care Home Management;Aquatic Therapy;Cryotherapy;Electrical Stimulation;Iontophoresis 88m/ml Dexamethasone;Moist Heat;Ultrasound;Functional mobility training;Gait training;Stair training;Therapeutic activities;Therapeutic exercise;Balance training;Neuromuscular re-education;Patient/family education;Manual techniques;Passive range of motion;Dry needling;Taping   ? PT Next Visit Plan review HEP; progress with shoulder and bilat ankle rehab - deep tissue work vs DN for shoulder girdle, PROM and stretching Rt shoulder; ROM, stretching, strengthening, gait training, stairs, progressing rehab as tolerated and per protocol; modalities as indicated   ? PT Home Exercise Plan [email or web address]  ? Consulted and Agree with Plan of Care Patient   ? ?  ?  ? ?  ? ? ?Patient will benefit from skilled therapeutic intervention  in order to improve the following deficits and impairments:    ? ?Visit Diagnosis: ?Acute pain of left shoulder ? ?Scapular dyskinesis ? ?Muscle weakness (generalized) ? ?Pain in left ankle and joints of

## 2021-12-01 DIAGNOSIS — T07XXXA Unspecified multiple injuries, initial encounter: Secondary | ICD-10-CM | POA: Diagnosis not present

## 2021-12-01 DIAGNOSIS — M810 Age-related osteoporosis without current pathological fracture: Secondary | ICD-10-CM | POA: Diagnosis not present

## 2021-12-01 DIAGNOSIS — S42309A Unspecified fracture of shaft of humerus, unspecified arm, initial encounter for closed fracture: Secondary | ICD-10-CM | POA: Diagnosis not present

## 2021-12-01 DIAGNOSIS — S82142A Displaced bicondylar fracture of left tibia, initial encounter for closed fracture: Secondary | ICD-10-CM | POA: Diagnosis not present

## 2021-12-03 ENCOUNTER — Ambulatory Visit: Payer: BC Managed Care – PPO | Admitting: Rehabilitative and Restorative Service Providers"

## 2021-12-03 ENCOUNTER — Encounter: Payer: Self-pay | Admitting: Rehabilitative and Restorative Service Providers"

## 2021-12-03 DIAGNOSIS — M25512 Pain in left shoulder: Secondary | ICD-10-CM | POA: Diagnosis not present

## 2021-12-03 DIAGNOSIS — M25571 Pain in right ankle and joints of right foot: Secondary | ICD-10-CM

## 2021-12-03 DIAGNOSIS — M25572 Pain in left ankle and joints of left foot: Secondary | ICD-10-CM

## 2021-12-03 DIAGNOSIS — R2689 Other abnormalities of gait and mobility: Secondary | ICD-10-CM | POA: Diagnosis not present

## 2021-12-03 DIAGNOSIS — G2589 Other specified extrapyramidal and movement disorders: Secondary | ICD-10-CM

## 2021-12-03 DIAGNOSIS — M6281 Muscle weakness (generalized): Secondary | ICD-10-CM

## 2021-12-03 NOTE — Therapy (Signed)
[facility] ?[place], [place], [address] ?Phone: [phone]   Fax:  [phone] ? ?Physical Therapy Treatment ? ?Patient Details  ?Name: Kathryn Boyer ?MRN: [id number] ?Date of Birth: [birth year] ?Referring Provider (PT): Dr Altamese Terlton; Dr Da Griffin Basil ? ? ?Encounter Date: 12/03/2021 ? ? PT End of Session - 12/03/21 2330   ? ? Visit Number 22   ? Number of Visits 30   ? Date for PT Re-Evaluation 01/02/22   ? Authorization Type BCBS   ? PT Start Time [phone]   ? PT Stop Time 0930   ? PT Time Calculation (min) 48 min   ? Activity Tolerance Patient tolerated treatment well   ? ?  ?  ? ?  ? ? ?Past Medical History:  ?Diagnosis Date  ? Anxiety   ? Asthma   ? Hyperlipidemia   ? Hypertension   ? Plantar fasciitis   ? Thyroid disease   ? ? ?Past Surgical History:  ?Procedure Laterality Date  ? ABDOMINAL HYSTERECTOMY    ? BREAST SURGERY    ? NOSE SURGERY    ? ORIF ANKLE FRACTURE Left 07/23/2021  ? Procedure: OPEN REDUCTION INTERNAL FIXATION (ORIF) ANKLE FRACTURE;  Surgeon: Altamese Harrison, MD;  Location: [place];  Service: Orthopedics;  Laterality: Left;  ? REVERSE SHOULDER ARTHROPLASTY Left 07/25/2021  ? Procedure: LEFT REVERSE SHOULDER ARTHROPLASTY;  Surgeon: Hiram Gash, MD;  Location: [place];  Service: Orthopedics;  Laterality: Left;  ? ? ?There were no vitals filed for this visit. ? ? Subjective Assessment - 12/03/21 0843   ? ? Subjective Patient reports that she is having more pain in the Lt shoulder and ankle today. May have overdone it this weekend. Working on her exercises. Injection has helped the knee pain   ? Currently in Pain? Yes   ? Pain Score 0-No pain   ? Pain Location Knee   ? Pain Orientation Left   ? Pain Score 6   ? Pain Location Shoulder   ? Pain Orientation Left   ? Pain Descriptors / Indicators Aching;Sore;Tightness   ? ?  ?  ? ?  ? ? ? ? ? ? ? ? ? ? ? ? ? ? ? ? ? ? ? ? [facility] Adult PT Treatment/Exercise - 12/03/21 0001   ? ?  ? Knee/Hip  Exercises: Stretches  ? Gastroc Stretch Left;30 seconds;Right;2 reps   ? Gastroc Stretch Limitations incline board   ? Soleus Stretch Left;30 seconds;Right;2 reps   ? Soleus Stretch Limitations incline board   ? Other Knee/Hip Stretches plantar fascia stretch x 30 sec x 2 reps   ?  ? Knee/Hip Exercises: Machines for Strengthening  ? Total Gym Leg Press 7 plates 2x15 double leg; 3 plates 2x15 reps Lt LE   ?  ? Knee/Hip Exercises: Standing  ? Forward Lunges Left;Right;10 reps;5 seconds   ? Lateral Step Up Limitations pain in Lt knee - stopped   ? Forward Step Up Left;20 reps;Hand Hold: 2;Step Height: 6"   ? Functional Squat 10 reps;10 seconds   ? SLS SLS working on balance without UE support working for control (pt required UE touch to balance during SLS 5 trials standing 10-30 sec   ? Other Standing Knee Exercises lateral band walk green tband above knees x 10 ft 5 reps each direction; backwards monster walk green tband above knees 10 ft x5 reps   ?  ? Shoulder Exercises: Supine  ? Flexion AAROM;Left;10  reps   ? Flexion Limitations active assistive shoulder flexion with posterior shoudler girdle engaged throughout the range - x 5 reps   ?  ? Shoulder Exercises: ROM/Strengthening  ? Over Head Lace hands behind head stretching ~ 1 min x 3 reps   ? Modified Plank Limitations wall plank 30 sec x 3 reps   ? Other ROM/Strengthening Exercises using dowel for UE exercises elbows/shoulder/wrists in various directions/movements/combinations of movements dowel in both hands and in Lt only   ?  ? Manual Therapy  ? Soft tissue mobilization STM Lt shoulder girdle with pt prone, sidelying, supine   ? Scapular Mobilization Lt scapular mobs   ? Passive ROM shoulder flex, ext;  abd, IR/ER in scapular plane   ?  ? Ankle Exercises: Aerobic  ? Nustep L6 x 6 min LEs only   ? ?  ?  ? ?  ? ? ? ? ? ? ? ? ? ? ? ? PT Short Term Goals - 11/23/21 1025   ? ?  ? PT SHORT TERM GOAL #1  ? Title Improve posture and alignment with patient to  deonstrate improved upright posture with posterior shoudler girdle engaged therefore improving shoulder mobilty and function   ? Baseline Requires cueing to maintain 3/31   ? Time 6   ? Period Weeks   ? Status Partially Met   ? Target Date 11/21/21   ?  ? PT SHORT TERM GOAL #2  ? Title Increase AROM Lt shoulder to 100-110 degress flexion and scaption   ? Baseline 117 deg seated AROM 3/31   ? Time 6   ? Period Weeks   ? Status Achieved   ? Target Date 11/21/21   ?  ? PT SHORT TERM GOAL #3  ? Title Increase AROM Lt ankle to 5 deg ankle DF and 30 deg PF   ? Baseline 6 deg ankle DF, 30 deg PF 3/31   ? Time 6   ? Period Weeks   ? Status Achieved   ? Target Date 11/21/21   ?  ? PT SHORT TERM GOAL #4  ? Title Patient to demonstrate improved gait pattern with least assistive device for household distances of 100-120 ft   ? Baseline Amb with no a/d but remains antalgic with decreased L LE stance time and hip instability noted (limited by L knee pain) 3/31   ? Time 6   ? Period Weeks   ? Status Partially Met   ? Target Date 11/21/21   ?  ? PT SHORT TERM GOAL #5  ? Title Patient to negotiate 4 steps to enter and exit home with assistive device as needed (does not have railing either side of steps)   ? Baseline Able to perform without railing but mildly antalgic and utilizes step to pattern with descent 3/31   ? Time 6   ? Period Weeks   ? Status Achieved   ? Target Date 11/21/21   ? ?  ?  ? ?  ? ? ? ? PT Long Term Goals - 10/10/21 1304   ? ?  ? PT LONG TERM GOAL #1  ? Title Functional AROM Lt shoulder allowing patient to return to normal functional activities   ? Time 12   ? Period Weeks   ? Status New   ? Target Date 01/02/22   ?  ? PT LONG TERM GOAL #2  ? Title 4+/5 to 5/5 strength Lt shoulder and bilat ankles   ?  Time 12   ? Period Weeks   ? Status New   ? Target Date 01/02/22   ?  ? PT LONG TERM GOAL #3  ? Title Ambulation community distances of several hundred yards without assistive device   ? Time 12   ? Period Weeks    ? Status New   ? Target Date 01/02/22   ?  ? PT LONG TERM GOAL #4  ? Title Independent in HEP(including aquatic program as indicated)   ? Time 12   ? Period Weeks   ? Status New   ? Target Date 01/02/22   ?  ? PT LONG TERM GOAL #5  ? Title Improve functional limitation score to 58   ? Time 12   ? Period Weeks   ? Status New   ? Target Date 01/02/22   ? ?  ?  ? ?  ? ? ? ? ? ? ? ? Plan - 12/03/21 0845   ? ? Clinical Impression Statement Continued gradual progress with Lt ankle and shoulder rehab. Patient has some days that are better than others related to activity level. She is working on her exercises at home. Continued exercise for LE/UE and manual work/PROM through the shoulder.   ? Rehab Potential Good   ? PT Frequency 3x / week   ? PT Duration 12 weeks   ? PT Treatment/Interventions ADLs/Self Care Home Management;Aquatic Therapy;Cryotherapy;Electrical Stimulation;Iontophoresis 40m/ml Dexamethasone;Moist Heat;Ultrasound;Functional mobility training;Gait training;Stair training;Therapeutic activities;Therapeutic exercise;Balance training;Neuromuscular re-education;Patient/family education;Manual techniques;Passive range of motion;Dry needling;Taping   ? PT Next Visit Plan progress HEP; progress with shoulder and bilat ankle rehab - deep tissue work vs DN for shoulder girdle, PROM and stretching Rt shoulder; ROM, stretching, strengthening, gait training, stairs, progressing rehab as tolerated and per protocol; modalities as indicated   ? PT Home Exercise Plan [id number]  ? Consulted and Agree with Plan of Care Patient   ? ?  ?  ? ?  ? ? ?Patient will benefit from skilled therapeutic intervention in order to improve the following deficits and impairments:    ? ?Visit Diagnosis: ?Acute pain of left shoulder ? ?Scapular dyskinesis ? ?Muscle weakness (generalized) ? ?Pain in left ankle and joints of left foot ? ?Other abnormalities of gait and mobility ? ?Pain in right ankle and joints of right foot ? ? ? ? ?Problem  List ?Patient Active Problem List  ? Diagnosis Date Noted  ? Preventative health care 10/26/2021  ? Pressure injury of skin of sacral region 08/10/2021  ? Acute cystitis without hematuria 08/10/2021  ? Acute blood

## 2021-12-04 ENCOUNTER — Ambulatory Visit (HOSPITAL_BASED_OUTPATIENT_CLINIC_OR_DEPARTMENT_OTHER): Payer: BC Managed Care – PPO

## 2021-12-04 ENCOUNTER — Ambulatory Visit (HOSPITAL_BASED_OUTPATIENT_CLINIC_OR_DEPARTMENT_OTHER)
Admission: RE | Admit: 2021-12-04 | Discharge: 2021-12-04 | Disposition: A | Discharge status: Home / Self Care | Payer: BC Managed Care – PPO | Source: Ambulatory Visit | Attending: Orthopedic Surgery | Admitting: Orthopedic Surgery

## 2021-12-04 DIAGNOSIS — S82852D Displaced trimalleolar fracture of left lower leg, subsequent encounter for closed fracture with routine healing: Secondary | ICD-10-CM | POA: Insufficient documentation

## 2021-12-04 DIAGNOSIS — M25472 Effusion, left ankle: Secondary | ICD-10-CM | POA: Diagnosis not present

## 2021-12-04 DIAGNOSIS — M7732 Calcaneal spur, left foot: Secondary | ICD-10-CM | POA: Diagnosis not present

## 2021-12-05 ENCOUNTER — Ambulatory Visit: Payer: BC Managed Care – PPO | Admitting: Rehabilitative and Restorative Service Providers"

## 2021-12-05 ENCOUNTER — Encounter: Payer: Self-pay | Admitting: Rehabilitative and Restorative Service Providers"

## 2021-12-05 DIAGNOSIS — R2689 Other abnormalities of gait and mobility: Secondary | ICD-10-CM | POA: Diagnosis not present

## 2021-12-05 DIAGNOSIS — M25571 Pain in right ankle and joints of right foot: Secondary | ICD-10-CM

## 2021-12-05 DIAGNOSIS — M25512 Pain in left shoulder: Secondary | ICD-10-CM | POA: Diagnosis not present

## 2021-12-05 DIAGNOSIS — M25572 Pain in left ankle and joints of left foot: Secondary | ICD-10-CM | POA: Diagnosis not present

## 2021-12-05 DIAGNOSIS — M6281 Muscle weakness (generalized): Secondary | ICD-10-CM | POA: Diagnosis not present

## 2021-12-05 DIAGNOSIS — G2589 Other specified extrapyramidal and movement disorders: Secondary | ICD-10-CM

## 2021-12-05 NOTE — Therapy (Addendum)
[facility] [place], [place], [address] Phone: [phone]   Fax:  [phone]  Physical Therapy Treatment  Patient Details  Name: Kathryn Boyer MRN: [id number] Date of Birth: 31-Mar-1956 Referring Provider (PT): Dr Altamese [place]; Dr Da Griffin Basil  PHYSICAL THERAPY DISCHARGE SUMMARY  Visits from Start of Care: 23  Current functional level related to goals / functional outcomes: See progress note for discharge status    Remaining deficits: Unknown    Education / Equipment: HEP    Patient agrees to discharge. Patient goals were partially met. Patient is being discharged due to  surgery to remove hardware in ankle. .  Rockelle Heuerman P. Helene Kelp PT, MPH 02/21/22 8:55 AM  Encounter Date: 12/05/2021   PT End of Session - 12/05/21 0852     Visit Number 23    Number of Visits 30    Date for PT Re-Evaluation 01/02/22    Authorization Type BCBS    PT Start Time 0845    PT Stop Time 0934    PT Time Calculation (min) 49 min    Activity Tolerance Patient tolerated treatment well             Past Medical History:  Diagnosis Date   Anxiety    Asthma    Hyperlipidemia    Hypertension    Plantar fasciitis    Thyroid disease     Past Surgical History:  Procedure Laterality Date   ABDOMINAL HYSTERECTOMY     BREAST SURGERY     NOSE SURGERY     ORIF ANKLE FRACTURE Left 07/23/2021   Procedure: OPEN REDUCTION INTERNAL FIXATION (ORIF) ANKLE FRACTURE;  Surgeon: Altamese Challis, MD;  Location: [facility];  Service: Orthopedics;  Laterality: Left;   REVERSE SHOULDER ARTHROPLASTY Left 07/25/2021   Procedure: LEFT REVERSE SHOULDER ARTHROPLASTY;  Surgeon: Hiram Gash, MD;  Location: [facility];  Service: Orthopedics;  Laterality: Left;    There were no vitals filed for this visit.   Subjective Assessment - 12/05/21 0853     Subjective Patient reports that her shoulderis "kiling me today". Not sure what she may have done that  irritated the shoulder. She does use the Lt UE to push up on the bedside toilet and reaching out to pull her car door closed. She also held the phone with Lt UE while talking on the phone yesterday. Did have the CT scan for Lt ankle yesterday.    Currently in Pain? No/denies    Pain Score 0-No pain    Pain Location Knee    Pain Orientation Left    Pain Score 8    Pain Location Shoulder    Pain Orientation Left    Pain Descriptors / Indicators Aching;Sore;Tightness    Pain Type Acute pain;Chronic pain                [facility] PT Assessment - 12/05/21 0001       Assessment   Medical Diagnosis Lt reverse TSA; Lt/Rt ankle fractures    Referring Provider (PT) Dr Altamese [place]; Dr Da Griffin Basil    Onset Date/Surgical Date 07/20/21    Hand Dominance Right    Next MD Visit 11/14/21    Prior Therapy in hospital; rehab; home health      Observation/Other Assessments   Focus on Therapeutic Outcomes (FOTO)  51      AROM   Right Shoulder Flexion 115 Degrees    Right Ankle Dorsiflexion 14    Right Ankle Plantar  Flexion 53    Right Ankle Inversion 35    Right Ankle Eversion 18    Left Ankle Dorsiflexion -5    Left Ankle Plantar Flexion 13    Left Ankle Inversion 4    Left Ankle Eversion 3      PROM   Left Shoulder Extension 75 Degrees    Left Shoulder Flexion 165 Degrees   supine   Left Shoulder ABduction 157 Degrees   supine in scapular plane   Left Shoulder External Rotation 76 Degrees   in scapular plane     Palpation   Palpation comment muscular tightness through the Lt pecs, anterior deltoid, biceps, upper trap, leveator, teres      Ambulation/Gait   Stairs Yes    Stairs Assistance 5: Supervision    Stair Management Technique Alternating pattern    Number of Stairs 5    Height of Stairs --   4 inch and 6 inch   Gait Comments improved gait pattern with cues for smaller steps (less antalgia)                           [facility] Adult PT Treatment/Exercise -  12/05/21 0001       Knee/Hip Exercises: Stretches   Gastroc Stretch Left;30 seconds;Right;2 reps    Gastroc Stretch Limitations incline board    Soleus Stretch Left;30 seconds;Right;2 reps    Soleus Stretch Limitations incline board    Other Knee/Hip Stretches plantar fascia stretch x 30 sec x 2 reps      Shoulder Exercises: Standing   Extension AROM;Strengthening;Left;10 reps    Extension Limitations holding cane    Retraction Strengthening;Both;10 reps    Retraction Limitations foam roll along spine      Shoulder Exercises: Pulleys   Flexion Limitations 10 sec hold x 10 VC/TC to keep Lt shoulder girdle down and back      Shoulder Exercises: ROM/Strengthening   Over Head Lace hands behind head stretching ~ 1 min x 3 reps    Modified Plank Limitations wall plank 30 sec x 3 reps    Other ROM/Strengthening Exercises using dowel for UE exercises elbows/shoulder/wrists in various directions/movements/combinations of movements dowel in both hands and in Lt only      Shoulder Exercises: Stretch   External Rotation Stretch 5 reps;10 seconds   using cane towel under arm     Manual Therapy   Soft tissue mobilization STM Lt shoulder girdle    Scapular Mobilization Lt scapular mobs    Passive ROM shoulder flex, ext;  abd, IR/ER in scapular plane      Ankle Exercises: Aerobic   Recumbent Bike L3 x 8 min for warm up                       PT Short Term Goals - 12/05/21 0944       PT SHORT TERM GOAL #1   Title Improve posture and alignment with patient to deonstrate improved upright posture with posterior shoudler girdle engaged therefore improving shoulder mobilty and function    Baseline Requires cueing to maintain    Time 6    Period Weeks    Status Revised    Target Date 01/02/22      PT SHORT TERM GOAL #2   Title Increase AROM Lt shoulder to 100-110 degress flexion and scaption    Baseline 117 deg seated AROM 3/31    Time 6  Period Weeks    Status Achieved     Target Date 11/21/21      PT SHORT TERM GOAL #3   Title Increase AROM Lt ankle to 5 deg ankle DF and 30 deg PF    Baseline 6 deg ankle DF, 30 deg PF 3/31    Time 6    Period Weeks    Status Achieved    Target Date 11/21/21      PT SHORT TERM GOAL #4   Title Patient to demonstrate improved gait pattern with least assistive device for household distances of 100-120 ft    Baseline Amb with no a/d but remains antalgic with decreased L LE stance time and hip instability noted (limited by L knee pain) 3/31    Time 6    Period Weeks    Status On-going    Target Date 01/02/22      PT SHORT TERM GOAL #5   Title Patient to negotiate 4 steps to enter and exit home with assistive device as needed (does not have railing either side of steps)    Baseline Able to perform without railing but mildly antalgic and utilizes step to pattern with descent 3/31    Time 6    Period Weeks    Status Achieved    Target Date 11/21/21               PT Long Term Goals - 12/05/21 0943       PT LONG TERM GOAL #1   Title Functional AROM Lt shoulder allowing patient to return to normal functional activities    Time 12    Period Weeks    Status Partially Met    Target Date 01/02/22      PT LONG TERM GOAL #2   Title 4+/5 to 5/5 strength Lt shoulder and bilat ankles    Time 12    Period Weeks    Status On-going    Target Date 01/02/22      PT LONG TERM GOAL #3   Title Ambulation community distances of several hundred yards without assistive device    Time 12    Period Weeks    Status On-going    Target Date 01/02/22      PT LONG TERM GOAL #4   Title Independent in HEP(including aquatic program as indicated)    Time 12    Period Weeks    Status On-going    Target Date 01/02/22      PT LONG TERM GOAL #5   Title Improve functional limitation score to 58    Time 12    Period Weeks    Status On-going    Target Date 01/02/22                   Plan - 12/05/21 0859      Clinical Impression Statement Patient reports significant increase in Lt shoulder pain today. She identifies several possible irritants for the shoulder. Patient has increased Lt shoulder pain with increased muscular tightness through the Lt shoulder girdle. Suggested that Kalayla continue with gentle exercise and contact MD in 2-3 days if shoulder pain persists. She will continue with current program for ankle rehab. Plan to hold PT pending patient's ankle revisiion to remove hardware Lt ankle. Date has not been set. Patient will call to schedule additional appointments following surgery as indicated.    Rehab Potential Good    PT Frequency 3x / week    PT Duration  12 weeks    PT Treatment/Interventions ADLs/Self Care Home Management;Aquatic Therapy;Cryotherapy;Electrical Stimulation;Iontophoresis 75m/ml Dexamethasone;Moist Heat;Ultrasound;Functional mobility training;Gait training;Stair training;Therapeutic activities;Therapeutic exercise;Balance training;Neuromuscular re-education;Patient/family education;Manual techniques;Passive range of motion;Dry needling;Taping    PT Next Visit Plan progress HEP; progress with shoulder and bilat ankle rehab - deep tissue work vs DN for shoulder girdle, PROM and stretching Rt shoulder; ROM, stretching, strengthening, gait training, stairs, progressing rehab as tolerated and per protocol; modalities as indicated - patient will continued with HEP and trainer pending ankle surgery to remove hardward Lt ankle. Hold PT at this time.    PT Home Exercise Plan [id number]   Consulted and Agree with Plan of Care Patient             Patient will benefit from skilled therapeutic intervention in order to improve the following deficits and impairments:     Visit Diagnosis: Acute pain of left shoulder  Scapular dyskinesis  Muscle weakness (generalized)  Pain in left ankle and joints of left foot  Other abnormalities of gait and mobility  Pain in right ankle and  joints of right foot     Problem List Patient Active Problem List   Diagnosis Date Noted   Preventative health care 10/26/2021   Pressure injury of skin of sacral region 08/10/2021   Acute cystitis without hematuria 08/10/2021   Acute blood loss anemia    History of hypertension    Drug induced constipation    Postoperative pain    Critical polytrauma 07/27/2021   Humerus fracture 07/21/2021   Tibial plateau fracture, left 07/21/2021   Nondisplaced fracture of fifth metatarsal bone, right foot, initial encounter for closed fracture 07/21/2021   Nondisplaced fracture of lateral malleolus of right fibula, initial encounter for closed fracture 07/21/2021   Left trimalleolar fracture, closed, initial encounter 07/21/2021   Fall 07/20/2021   Urine frequency 04/29/2019   Chest pain 04/29/2019   Urinary frequency 04/29/2019   Post-menopausal 04/29/2019   Allergic rhinitis 10/13/2018   Mild intermittent asthma with acute exacerbation 10/13/2018   Gastroesophageal reflux disease 10/13/2018   Right hip pain 06/01/2018   Chronic low back pain 05/25/2018   Acute pain of right knee 05/25/2018   Thoracic aorta atherosclerosis ([place] 05/25/2018   Generalized anxiety disorder 03/22/2017   Hyperlipidemia 03/22/2017   Hypothyroidism 03/22/2017   Osteoporosis 12/20/2016   Encounter for counseling 09/18/2016   Morbid obesity ([place] 02/06/2016   Vaginal dryness 02/06/2016   Allergic rhinitis due to pollen 02/01/2016   Acute maxillary sinusitis 02/01/2016   Essential hypertension 02/01/2016   Asthma with acute exacerbation 02/01/2016   Lumbar radiculopathy, acute 11/01/2015   Osteoporosis, 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

## 2021-12-11 ENCOUNTER — Other Ambulatory Visit: Payer: BC Managed Care – PPO

## 2021-12-14 DIAGNOSIS — M80052D Age-related osteoporosis with current pathological fracture, left femur, subsequent encounter for fracture with routine healing: Secondary | ICD-10-CM | POA: Diagnosis not present

## 2021-12-14 DIAGNOSIS — M80061D Age-related osteoporosis with current pathological fracture, right lower leg, subsequent encounter for fracture with routine healing: Secondary | ICD-10-CM | POA: Diagnosis not present

## 2021-12-14 DIAGNOSIS — M80072D Age-related osteoporosis with current pathological fracture, left ankle and foot, subsequent encounter for fracture with routine healing: Secondary | ICD-10-CM | POA: Diagnosis not present

## 2021-12-14 DIAGNOSIS — M80071D Age-related osteoporosis with current pathological fracture, right ankle and foot, subsequent encounter for fracture with routine healing: Secondary | ICD-10-CM | POA: Diagnosis not present

## 2021-12-26 DIAGNOSIS — N390 Urinary tract infection, site not specified: Secondary | ICD-10-CM | POA: Diagnosis not present

## 2021-12-31 DIAGNOSIS — T07XXXA Unspecified multiple injuries, initial encounter: Secondary | ICD-10-CM | POA: Diagnosis not present

## 2021-12-31 DIAGNOSIS — S42309A Unspecified fracture of shaft of humerus, unspecified arm, initial encounter for closed fracture: Secondary | ICD-10-CM | POA: Diagnosis not present

## 2021-12-31 DIAGNOSIS — S82142A Displaced bicondylar fracture of left tibia, initial encounter for closed fracture: Secondary | ICD-10-CM | POA: Diagnosis not present

## 2021-12-31 DIAGNOSIS — M810 Age-related osteoporosis without current pathological fracture: Secondary | ICD-10-CM | POA: Diagnosis not present

## 2022-01-04 ENCOUNTER — Other Ambulatory Visit: Payer: Self-pay | Admitting: Family Medicine

## 2022-01-04 NOTE — Telephone Encounter (Signed)
Pt needs xanx rx refilled  ? ?[id number] - [place], [place] - [address]  ?[address], [place] [address]  ?Phone:  [phone]  Fax:  [phone]  ?

## 2022-01-04 NOTE — Telephone Encounter (Signed)
Requesting: Xanax ?Contract: 2022 ?UDS: 2022 ?Last OV: 10/26/2020 ?Next OV: 04/30/2022 ?Last Refill: 08/01/2021, #30--0 RF ?Database: ? ? ?Please advise  ? ?

## 2022-01-07 ENCOUNTER — Encounter (HOSPITAL_COMMUNITY): Payer: Self-pay | Admitting: Orthopedic Surgery

## 2022-01-07 ENCOUNTER — Other Ambulatory Visit: Payer: Self-pay

## 2022-01-07 MED ORDER — ALPRAZOLAM 0.5 MG PO TABS
0.5000 mg | ORAL_TABLET | Freq: Three times a day (TID) | ORAL | 0 refills | Status: DC | PRN
Start: 2022-01-07 — End: 2022-04-23

## 2022-01-07 NOTE — H&P (Signed)
? ?     Orthopaedic Trauma Service (OTS) Consult  ? ?Patient ID: ?Kathryn Boyer ?MRN: [id number] ?DOB/AGE: November 06, 1955 66 y.o. ? ? ?HPI: Kathryn Boyer is an 66 y.o. female s/p fall nov 2022 who sustained multiple fractures including left ankle fracture.  Patient underwent ORIF.  She is healed her fracture but continues to have left ankle pain.  Patient presents today for removal of hardware left ankle.  Conservative measures have been exhausted and still failed to yield adequate pain relief.  Risks and benefits reviewed with the patient and she wishes to proceed.  There will be no restrictions postop.  [name] postoperatively with range of motion as tolerated postoperatively. ? ?Past Medical History:  ?Diagnosis Date  ? Anxiety   ? Asthma   ? Hyperlipidemia   ? Hypertension   ? Plantar fasciitis   ? Thyroid disease   ? ? ?Past Surgical History:  ?Procedure Laterality Date  ? ABDOMINAL HYSTERECTOMY    ? BREAST SURGERY    ? NOSE SURGERY    ? ORIF ANKLE FRACTURE Left 07/23/2021  ? Procedure: OPEN REDUCTION INTERNAL FIXATION (ORIF) ANKLE FRACTURE;  Surgeon: Myrene Galas, MD;  Location: [facility] OR;  Service: Orthopedics;  Laterality: Left;  ? REVERSE SHOULDER ARTHROPLASTY Left 07/25/2021  ? Procedure: LEFT REVERSE SHOULDER ARTHROPLASTY;  Surgeon: Bjorn Pippin, MD;  Location: [facility] OR;  Service: Orthopedics;  Laterality: Left;  ? ? ?Family History  ?Problem Relation Age of Onset  ? Heart disease Mother   ? Stroke Mother   ? Hypertension Mother   ? Cancer Mother   ?     ovarian  ? Aneurysm Father   ? Hypertension Father   ? Heart disease Father   ? Kidney disease Father   ? Heart disease Sister   ? ? ?Social History:  reports that she has never smoked. She has never used smokeless tobacco. She reports that she does not drink alcohol and does not use drugs. ? ?Allergies:  ?Allergies  ?Allergen Reactions  ? Contrast Media [Iodinated Contrast Media] Hives  ? Isovue [Iopamidol] Hives  ?  After  returning home pt called to inform technologist of rash to face and neck, denies difficulty breathing or swallowing, pt took 2 benedryl ?Will need premeds in future  ? Amoxicillin Other (See Comments)  ?  Headache ? ?Cephalosporin tolerant 07/23/2021  ? Belviq Xr [Lorcaserin Hcl Er] Itching  ?  Caused her face to turn red.  ? Saxenda [Liraglutide -Weight Management] Other (See Comments)  ?  Caused indigestion.  ? Penicillins Rash  ?  Cephalosporin Tolerant 07/23/2021 ?  ? ? ?Medications:  ?Current Meds  ?Medication Sig  ? ALPRAZolam (XANAX) 0.5 MG tablet Take 1 tablet (0.5 mg total) by mouth 3 (three) times daily as needed for anxiety. (Patient taking differently: Take 0.25 mg by mouth 3 (three) times daily as needed for anxiety.)  ? Camphor-Menthol-Methyl Sal (SALONPAS) 3.08-31-08 % PTCH Apply 5 patches topically daily.  ? cyclobenzaprine (FLEXERIL) 5 MG tablet Take 5 mg by mouth every 8 (eight) hours as needed for pain.  ? diclofenac Sodium (VOLTAREN) 1 % GEL Apply 2 g topically daily as needed (pain).  ? diphenhydrAMINE (BENADRYL) 25 MG tablet Take 25 mg by mouth every 6 (six) hours as needed for allergies.  ? estradiol (ESTRACE) 0.1 MG/GM vaginal cream Place 1 Applicatorful vaginally 3 (three) times a week.  ? famotidine (PEPCID) 20 MG tablet Take 1 tablet (20 mg total) by mouth  2 (two) times daily. (Patient taking differently: Take 20 mg by mouth daily as needed for heartburn or indigestion.)  ? famotidine-calcium carbonate-magnesium hydroxide (PEPCID COMPLETE) 10-800-165 MG chewable tablet Chew 1 tablet by mouth daily as needed (Heartburn).  ? fluticasone (FLOVENT HFA) 110 MCG/ACT inhaler Inhale 2 puffs into the lungs 2 (two) times daily. (Patient taking differently: Inhale 2 puffs into the lungs daily as needed (allergies/ asthma).)  ? ibuprofen (ADVIL) 200 MG tablet Take 600 mg by mouth every 6 (six) hours as needed for mild pain or moderate pain.  ? levothyroxine (SYNTHROID) 88 MCG tablet Take 1 tablet (88  mcg total) by mouth daily. (Patient taking differently: Take 88 mcg by mouth daily.)  ? Multiple Vitamins-Minerals (MULTIVITAMIN WITH MINERALS) tablet Take 1 tablet by mouth daily. V thrive  ? olmesartan (BENICAR) 40 MG tablet TAKE 1 TABLET(40 MG) BY MOUTH DAILY  ? OVER THE COUNTER MEDICATION Apply 1 application. topically daily as needed (Pain). Body Balm  ? pravastatin (PRAVACHOL) 20 MG tablet TAKE 1 TABLET(20 MG) BY MOUTH DAILY  ? Probiotic Product (PROBIOTIC PO) Take 1 capsule by mouth daily.  ? Romosozumab-aqqg (EVENITY) 105 MG/1.17ML SOSY injection Inject 210 mg into the skin every 30 (thirty) days.  ? VENTOLIN HFA 108 (90 Base) MCG/ACT inhaler Inhale 2 puffs into the lungs every 4 (four) hours as needed for wheezing or shortness of breath.  ? Vitamin D, Ergocalciferol, (DRISDOL) 1.25 MG (50000 UNIT) CAPS capsule Take 1 capsule (50,000 Units total) by mouth every 7 (seven) days.  ? ? ? ?No results found for this or any previous visit (from the past 48 hour(s)). ? ?No results found. ? ?Intake/Output   ? None  ?  ? ? ?Review of Systems  ?Constitutional:  Negative for chills and fever.  ?Respiratory:  Negative for shortness of breath.   ?Cardiovascular:  Negative for chest pain and palpitations.  ?Gastrointestinal:  Negative for abdominal pain, nausea and vomiting.  ?Musculoskeletal:  Positive for joint pain (L ankle pain).  ?Neurological:  Negative for tingling and sensory change.  ?There were no vitals taken for this visit. ?Physical Exam ?Constitutional:   ?   General: She is not in acute distress. ?   Appearance: Normal appearance.  ?HENT:  ?   Head: Normocephalic and atraumatic.  ?   Mouth/Throat:  ?   Mouth: Mucous membranes are moist.  ?Eyes:  ?   Extraocular Movements: Extraocular movements intact.  ?Cardiovascular:  ?   Rate and Rhythm: Normal rate and regular rhythm.  ?Pulmonary:  ?   Effort: Pulmonary effort is normal.  ?Abdominal:  ?   General: Abdomen is flat.  ?   Palpations: Abdomen is soft.   ?Musculoskeletal:  ?   Comments: Left Lower Extremity  ?Surgical wounds left ankle well-healed ?Swelling well controlled ?Distal motor and sensory function intact ?Patient is tender over her hardware medially and laterally ?+ DP pulse ?No DCT  ?Compartments are soft ?Good ankle range of motion is noted ?Knee is unremarkable  ?Neurological:  ?   General: No focal deficit present.  ?   Mental Status: She is alert and oriented to person, place, and time.  ?Psychiatric:     ?   Mood and Affect: Mood normal.     ?   Behavior: Behavior normal.     ?   Thought Content: Thought content normal.  ? ? ?Assessment/Plan: ? ?66 y/o female s/p ORIF L ankle with symptomatic hardware  ? ?-symptomatic hardware L ankle s/p  ORIF  ? OR for [facility] L ankle  ? WBAT post op ? ROM as tolerated post op  ? Risks and benefits reviewed with the patient.  She wishes to proceed ? Outpatient procedure ? ?- Pain management: ? Multimodal  ? ?- Dispo: ? OR for [facility] L ankle  ? ? ? ?Mearl Latin, PA-C ?[phone] (C) ?01/07/2022, 12:20 PM ? ?[facility] ?[address] ?[place] [address] ?[phone] [place]) ?[phone] (F) ? ? ? ?After 5pm and on the weekends please log on to Amion, go to orthopaedics and the look under the [facility] Call for the provider(s) on call. You can also call our office at [phone] and then follow the prompts to be connected to the call team.   ? ? ?

## 2022-01-07 NOTE — Anesthesia Preprocedure Evaluation (Addendum)
Anesthesia Evaluation  ?Patient identified by MRN, date of birth, ID band ?Patient awake ? ? ? ?Reviewed: ?Allergy & Precautions, NPO status , Patient's Chart, lab work & pertinent test results ? ?History of Anesthesia Complications ?Negative for: history of anesthetic complications ? ?Airway ?Mallampati: II ? ?TM Distance: >3 FB ?Neck ROM: Full ? ? ? Dental ? ?(+) Missing, Chipped,  ?  ?Pulmonary ?asthma ,  ?  ?Pulmonary exam normal ? ? ? ? ? ? ? Cardiovascular ?hypertension, Pt. on medications ?Normal cardiovascular exam ? ? ?  ?Neuro/Psych ?Anxiety negative neurological ROS ?   ? GI/Hepatic ?Neg liver ROS, GERD  Medicated and Controlled,  ?Endo/Other  ?Hypothyroidism  ? Renal/GU ?negative Renal ROS  ?negative genitourinary ?  ?Musculoskeletal ?SYMPTOMATIC HARDWARE LEFT ANKLE  ? Abdominal ?  ?Peds ? Hematology ?negative hematology ROS ?(+)   ?Anesthesia Other Findings ?Day of surgery medications reviewed with patient. ? Reproductive/Obstetrics ?negative OB ROS ? ?  ? ? ? ? ? ? ? ? ? ? ? ? ? ?  ?  ? ? ? ? ? ? ?Anesthesia Physical ?Anesthesia Plan ? ?ASA: 2 ? ?Anesthesia Plan: General  ? ?Post-op Pain Management: Tylenol PO (pre-op)*  ? ?Induction: Intravenous ? ?PONV Risk Score and Plan: 3 and Treatment may vary due to age or medical condition, Midazolam, Dexamethasone and Ondansetron ? ?Airway Management Planned: LMA ? ?Additional Equipment: None ? ?Intra-op Plan:  ? ?Post-operative Plan: Extubation in OR ? ?Informed Consent: I have reviewed the patients History and Physical, chart, labs and discussed the procedure including the risks, benefits and alternatives for the proposed anesthesia with the patient or authorized representative who has indicated his/her understanding and acceptance.  ? ? ? ?Dental advisory given ? ?Plan Discussed with: CRNA ? ?Anesthesia Plan Comments:   ? ? ? ? ? ?Anesthesia Quick Evaluation ? ?

## 2022-01-07 NOTE — Progress Notes (Signed)
SDW CALL ? ?Patient was given pre-op instructions over the phone. The opportunity was given for the patient to ask questions. No further questions asked. Patient verbalized understanding of instructions given. ? ? ?PCP - Dr. Etter Sjogren ?Cardiologist -  denies ?Urology: Dr. Anda Latina had recurrent UTI's after last surgery. Pt requests it be noted she wants to avoid catheter insertion during surgery if possible ? ?Chest x-ray - 06/21/21 ?EKG -  07/20/21 ?Stress Test -  ?ECHO -  06/23/2018 ?Cardiac Cath -  ? ? ?Blood Thinner Instructions: No NSAIDS ?Aspirin Instructions: ? ?ERAS Protcol - NPO ?PRE-SURGERY Ensure or G2-  ? ?COVID TEST- ambulatory, n/a ? ?Pt verbalized severe anxiety related to upcoming surgery. Emotional support over phone provided. ? ? ?Anesthesia review:  ? ?Patient denies shortness of breath, fever, cough and chest pain over the phone call ? ? ? ?

## 2022-01-08 ENCOUNTER — Ambulatory Visit (HOSPITAL_COMMUNITY): Payer: BC Managed Care – PPO | Admitting: Anesthesiology

## 2022-01-08 ENCOUNTER — Ambulatory Visit (HOSPITAL_COMMUNITY): Payer: BC Managed Care – PPO

## 2022-01-08 ENCOUNTER — Encounter (HOSPITAL_COMMUNITY): Payer: Self-pay | Admitting: Orthopedic Surgery

## 2022-01-08 ENCOUNTER — Other Ambulatory Visit: Payer: Self-pay

## 2022-01-08 ENCOUNTER — Encounter (HOSPITAL_COMMUNITY)
Admission: RE | Disposition: A | Discharge status: Home / Self Care | Payer: Self-pay | Source: Home / Self Care | Attending: Orthopedic Surgery

## 2022-01-08 ENCOUNTER — Ambulatory Visit (HOSPITAL_COMMUNITY)
Admission: RE | Admit: 2022-01-08 | Discharge: 2022-01-08 | Disposition: A | Discharge status: Home / Self Care | Payer: BC Managed Care – PPO | Attending: Orthopedic Surgery | Admitting: Orthopedic Surgery

## 2022-01-08 DIAGNOSIS — K219 Gastro-esophageal reflux disease without esophagitis: Secondary | ICD-10-CM | POA: Diagnosis not present

## 2022-01-08 DIAGNOSIS — Z472 Encounter for removal of internal fixation device: Secondary | ICD-10-CM | POA: Diagnosis not present

## 2022-01-08 DIAGNOSIS — T8484XA Pain due to internal orthopedic prosthetic devices, implants and grafts, initial encounter: Secondary | ICD-10-CM | POA: Insufficient documentation

## 2022-01-08 DIAGNOSIS — I10 Essential (primary) hypertension: Secondary | ICD-10-CM | POA: Diagnosis not present

## 2022-01-08 DIAGNOSIS — E039 Hypothyroidism, unspecified: Secondary | ICD-10-CM | POA: Insufficient documentation

## 2022-01-08 DIAGNOSIS — F419 Anxiety disorder, unspecified: Secondary | ICD-10-CM | POA: Insufficient documentation

## 2022-01-08 DIAGNOSIS — Y793 Surgical instruments, materials and orthopedic devices (including sutures) associated with adverse incidents: Secondary | ICD-10-CM | POA: Diagnosis not present

## 2022-01-08 DIAGNOSIS — Z8781 Personal history of (healed) traumatic fracture: Secondary | ICD-10-CM | POA: Insufficient documentation

## 2022-01-08 DIAGNOSIS — J45909 Unspecified asthma, uncomplicated: Secondary | ICD-10-CM | POA: Insufficient documentation

## 2022-01-08 HISTORY — PX: HARDWARE REMOVAL: SHX979

## 2022-01-08 HISTORY — DX: Gastro-esophageal reflux disease without esophagitis: K21.9

## 2022-01-08 LAB — CBC
HCT: 44.1 % (ref 36.0–46.0)
Hemoglobin: 14.8 g/dL (ref 12.0–15.0)
MCH: 30.9 pg (ref 26.0–34.0)
MCHC: 33.6 g/dL (ref 30.0–36.0)
MCV: 92.1 fL (ref 80.0–100.0)
Platelets: 248 10*3/uL (ref 150–400)
RBC: 4.79 MIL/uL (ref 3.87–5.11)
RDW: 13.4 % (ref 11.5–15.5)
WBC: 6.8 10*3/uL (ref 4.0–10.5)
nRBC: 0 % (ref 0.0–0.2)

## 2022-01-08 LAB — BASIC METABOLIC PANEL
Anion gap: 8 (ref 5–15)
BUN: 10 mg/dL (ref 8–23)
CO2: 23 mmol/L (ref 22–32)
Calcium: 9.1 mg/dL (ref 8.9–10.3)
Chloride: 108 mmol/L (ref 98–111)
Creatinine, Ser: 0.96 mg/dL (ref 0.44–1.00)
GFR, Estimated: >60 mL/min (ref >60)
Glucose, Bld: 109 mg/dL — ABNORMAL HIGH (ref 70–99)
Potassium: 3.9 mmol/L (ref 3.5–5.1)
Sodium: 139 mmol/L (ref 135–145)

## 2022-01-08 SURGERY — REMOVAL, HARDWARE
Anesthesia: General | Laterality: Left

## 2022-01-08 MED ORDER — LACTATED RINGERS IV SOLN: INTRAVENOUS | Status: DC

## 2022-01-08 MED ORDER — BUPIVACAINE-EPINEPHRINE 0.25% -1:200000 IJ SOLN
INTRAMUSCULAR | Status: DC | PRN
Start: 2022-01-08 — End: 2022-01-08
  Administered 2022-01-08: 12 mL

## 2022-01-08 MED ORDER — PROPOFOL 10 MG/ML IV BOLUS
INTRAVENOUS | Status: AC
  Filled 2022-01-08: qty 20

## 2022-01-08 MED ORDER — PHENYLEPHRINE 80 MCG/ML (10ML) SYRINGE FOR IV PUSH (FOR BLOOD PRESSURE SUPPORT)
PREFILLED_SYRINGE | INTRAVENOUS | Status: AC
  Filled 2022-01-08: qty 10

## 2022-01-08 MED ORDER — EPHEDRINE SULFATE-NACL 50-0.9 MG/10ML-% IV SOSY
PREFILLED_SYRINGE | INTRAVENOUS | Status: DC | PRN
  Administered 2022-01-08: 5 mg via INTRAVENOUS

## 2022-01-08 MED ORDER — CEFAZOLIN SODIUM-DEXTROSE 2-4 GM/100ML-% IV SOLN
2.0000 g | INTRAVENOUS | Status: AC
  Administered 2022-01-08: 2 g via INTRAVENOUS

## 2022-01-08 MED ORDER — MIDAZOLAM HCL 5 MG/5ML IJ SOLN
INTRAMUSCULAR | Status: DC | PRN
  Administered 2022-01-08: 2 mg via INTRAVENOUS

## 2022-01-08 MED ORDER — ONDANSETRON HCL 4 MG/2ML IJ SOLN
INTRAMUSCULAR | Status: DC | PRN
  Administered 2022-01-08: 4 mg via INTRAVENOUS

## 2022-01-08 MED ORDER — OXYCODONE HCL 5 MG PO TABS: 5.0000 mg | ORAL_TABLET | Freq: Once | ORAL | Status: AC | PRN

## 2022-01-08 MED ORDER — 0.9 % SODIUM CHLORIDE (POUR BTL) OPTIME
TOPICAL | Status: DC | PRN
Start: 2022-01-08 — End: 2022-01-08
  Administered 2022-01-08: 1000 mL

## 2022-01-08 MED ORDER — DEXAMETHASONE SODIUM PHOSPHATE 10 MG/ML IJ SOLN
INTRAMUSCULAR | Status: DC | PRN
  Administered 2022-01-08: 10 mg via INTRAVENOUS

## 2022-01-08 MED ORDER — BUPIVACAINE-EPINEPHRINE (PF) 0.25% -1:200000 IJ SOLN
INTRAMUSCULAR | Status: AC
  Filled 2022-01-08: qty 30

## 2022-01-08 MED ORDER — OXYCODONE HCL 5 MG/5ML PO SOLN
ORAL | Status: AC
  Filled 2022-01-08: qty 5

## 2022-01-08 MED ORDER — ORAL CARE MOUTH RINSE: 15.0000 mL | Freq: Once | OROMUCOSAL | Status: AC

## 2022-01-08 MED ORDER — PHENYLEPHRINE HCL-NACL 20-0.9 MG/250ML-% IV SOLN
INTRAVENOUS | Status: DC | PRN
Start: 2022-01-08 — End: 2022-01-08
  Administered 2022-01-08: 20 ug/min via INTRAVENOUS

## 2022-01-08 MED ORDER — ONDANSETRON 4 MG PO TBDP
4.0000 mg | ORAL_TABLET | Freq: Three times a day (TID) | ORAL | 0 refills | Status: DC | PRN
Start: 2022-01-08 — End: 2023-12-16

## 2022-01-08 MED ORDER — EPHEDRINE 5 MG/ML INJ
INTRAVENOUS | Status: AC
  Filled 2022-01-08: qty 5

## 2022-01-08 MED ORDER — FENTANYL CITRATE (PF) 100 MCG/2ML IJ SOLN
INTRAMUSCULAR | Status: AC
  Filled 2022-01-08: qty 2

## 2022-01-08 MED ORDER — ACETAMINOPHEN 500 MG PO TABS
1000.0000 mg | ORAL_TABLET | Freq: Once | ORAL | Status: AC
  Administered 2022-01-08: 1000 mg via ORAL

## 2022-01-08 MED ORDER — DEXAMETHASONE SODIUM PHOSPHATE 10 MG/ML IJ SOLN
INTRAMUSCULAR | Status: AC
  Filled 2022-01-08: qty 1

## 2022-01-08 MED ORDER — OXYCODONE HCL 5 MG/5ML PO SOLN
5.0000 mg | Freq: Once | ORAL | Status: AC | PRN
  Administered 2022-01-08: 5 mg via ORAL

## 2022-01-08 MED ORDER — FENTANYL CITRATE (PF) 250 MCG/5ML IJ SOLN
INTRAMUSCULAR | Status: AC
  Filled 2022-01-08: qty 5

## 2022-01-08 MED ORDER — PROPOFOL 10 MG/ML IV BOLUS
INTRAVENOUS | Status: DC | PRN
  Administered 2022-01-08: 150 mg via INTRAVENOUS

## 2022-01-08 MED ORDER — LIDOCAINE 2% (20 MG/ML) 5 ML SYRINGE
INTRAMUSCULAR | Status: DC | PRN
  Administered 2022-01-08: 100 mg via INTRAVENOUS

## 2022-01-08 MED ORDER — HYDROMORPHONE HCL 1 MG/ML IJ SOLN
INTRAMUSCULAR | Status: AC
  Filled 2022-01-08: qty 1

## 2022-01-08 MED ORDER — HYDROMORPHONE HCL 1 MG/ML IJ SOLN
0.2500 mg | INTRAMUSCULAR | Status: DC | PRN
  Administered 2022-01-08 (×2): 0.5 mg via INTRAVENOUS

## 2022-01-08 MED ORDER — FENTANYL CITRATE (PF) 100 MCG/2ML IJ SOLN
25.0000 ug | INTRAMUSCULAR | Status: DC | PRN
  Administered 2022-01-08 (×2): 50 ug via INTRAVENOUS

## 2022-01-08 MED ORDER — CHLORHEXIDINE GLUCONATE 0.12 % MT SOLN
15.0000 mL | Freq: Once | OROMUCOSAL | Status: AC
  Administered 2022-01-08: 15 mL via OROMUCOSAL

## 2022-01-08 MED ORDER — FENTANYL CITRATE (PF) 250 MCG/5ML IJ SOLN
INTRAMUSCULAR | Status: DC | PRN
  Administered 2022-01-08 (×2): 25 ug via INTRAVENOUS
  Administered 2022-01-08 (×3): 50 ug via INTRAVENOUS

## 2022-01-08 MED ORDER — ONDANSETRON HCL 4 MG/2ML IJ SOLN
INTRAMUSCULAR | Status: AC
  Filled 2022-01-08: qty 2

## 2022-01-08 MED ORDER — LIDOCAINE 2% (20 MG/ML) 5 ML SYRINGE
INTRAMUSCULAR | Status: AC
  Filled 2022-01-08: qty 5

## 2022-01-08 MED ORDER — OXYCODONE-ACETAMINOPHEN 5-325 MG PO TABS: 1.0000 | ORAL_TABLET | Freq: Three times a day (TID) | ORAL | 0 refills | Status: DC | PRN

## 2022-01-08 MED ORDER — PHENYLEPHRINE 80 MCG/ML (10ML) SYRINGE FOR IV PUSH (FOR BLOOD PRESSURE SUPPORT)
PREFILLED_SYRINGE | INTRAVENOUS | Status: DC | PRN
Start: 2022-01-08 — End: 2022-01-08
  Administered 2022-01-08: 40 ug via INTRAVENOUS
  Administered 2022-01-08: 80 ug via INTRAVENOUS
  Administered 2022-01-08: 40 ug via INTRAVENOUS
  Administered 2022-01-08 (×3): 80 ug via INTRAVENOUS
  Administered 2022-01-08: 120 ug via INTRAVENOUS
  Administered 2022-01-08: 80 ug via INTRAVENOUS

## 2022-01-08 MED ORDER — AMISULPRIDE (ANTIEMETIC) 5 MG/2ML IV SOLN: 10.0000 mg | Freq: Once | INTRAVENOUS | Status: DC | PRN

## 2022-01-08 MED ORDER — MIDAZOLAM HCL 2 MG/2ML IJ SOLN
INTRAMUSCULAR | Status: AC
  Filled 2022-01-08: qty 2

## 2022-01-08 SURGICAL SUPPLY — 49 items
BAG COUNTER SPONGE SURGICOUNT (BAG) ×2 IMPLANT
BNDG ELASTIC 4X5.8 VLCR NS LF (GAUZE/BANDAGES/DRESSINGS) ×1 IMPLANT
BNDG ELASTIC 6X5.8 VLCR STR LF (GAUZE/BANDAGES/DRESSINGS) ×2 IMPLANT
BRUSH SCRUB EZ PLAIN DRY (MISCELLANEOUS) ×4 IMPLANT
COVER SURGICAL LIGHT HANDLE (MISCELLANEOUS) ×3 IMPLANT
DRAPE C-ARM 42X72 X-RAY (DRAPES) ×1 IMPLANT
DRAPE C-ARMOR (DRAPES) ×2 IMPLANT
DRAPE U-SHAPE 47X51 STRL (DRAPES) ×2 IMPLANT
DRSG MEPITEL 4X7.2 (GAUZE/BANDAGES/DRESSINGS) ×1 IMPLANT
ELECT REM PT RETURN 9FT ADLT (ELECTROSURGICAL) ×2
ELECTRODE REM PT RTRN 9FT ADLT (ELECTROSURGICAL) ×1 IMPLANT
GAUZE SPONGE 4X4 12PLY STRL (GAUZE/BANDAGES/DRESSINGS) ×3 IMPLANT
GLOVE BIO SURGEON STRL SZ7.5 (GLOVE) ×2 IMPLANT
GLOVE BIO SURGEON STRL SZ8 (GLOVE) ×2 IMPLANT
GLOVE BIOGEL PI IND STRL 7.5 (GLOVE) ×1 IMPLANT
GLOVE BIOGEL PI IND STRL 8 (GLOVE) ×1 IMPLANT
GLOVE BIOGEL PI INDICATOR 7.5 (GLOVE) ×1
GLOVE BIOGEL PI INDICATOR 8 (GLOVE) ×1
GLOVE SURG ORTHO LTX SZ7.5 (GLOVE) ×4 IMPLANT
GOWN STRL REUS W/ TWL LRG LVL3 (GOWN DISPOSABLE) ×2 IMPLANT
GOWN STRL REUS W/ TWL XL LVL3 (GOWN DISPOSABLE) ×1 IMPLANT
GOWN STRL REUS W/TWL LRG LVL3 (GOWN DISPOSABLE) ×4
GOWN STRL REUS W/TWL XL LVL3 (GOWN DISPOSABLE) ×2
KIT BASIN OR (CUSTOM PROCEDURE TRAY) ×2 IMPLANT
KIT TURNOVER KIT B (KITS) ×2 IMPLANT
MANIFOLD NEPTUNE II (INSTRUMENTS) ×2 IMPLANT
NDL HYPO 25GX1X1/2 BEV (NEEDLE) IMPLANT
NEEDLE HYPO 25GX1X1/2 BEV (NEEDLE) ×2 IMPLANT
NS IRRIG 1000ML POUR BTL (IV SOLUTION) ×2 IMPLANT
PACK ORTHO EXTREMITY (CUSTOM PROCEDURE TRAY) ×2 IMPLANT
PAD ARMBOARD 7.5X6 YLW CONV (MISCELLANEOUS) ×4 IMPLANT
PAD CAST 4YDX4 CTTN HI CHSV (CAST SUPPLIES) IMPLANT
PADDING CAST COTTON 4X4 STRL (CAST SUPPLIES) ×2
PADDING CAST COTTON 6X4 STRL (CAST SUPPLIES) ×4 IMPLANT
SPONGE T-LAP 18X18 ~~LOC~~+RFID (SPONGE) ×2 IMPLANT
SUCTION FRAZIER HANDLE 10FR (MISCELLANEOUS) ×2
SUCTION TUBE FRAZIER 10FR DISP (MISCELLANEOUS) IMPLANT
SUT ETHILON 2 0 FS 18 (SUTURE) ×2 IMPLANT
SUT VIC AB 0 CT1 27 (SUTURE) ×2
SUT VIC AB 0 CT1 27XBRD ANBCTR (SUTURE) IMPLANT
SUT VIC AB 2-0 CT1 27 (SUTURE) ×2
SUT VIC AB 2-0 CT1 TAPERPNT 27 (SUTURE) IMPLANT
SYR CONTROL 10ML LL (SYRINGE) ×1 IMPLANT
TOWEL GREEN STERILE (TOWEL DISPOSABLE) ×4 IMPLANT
TOWEL GREEN STERILE FF (TOWEL DISPOSABLE) ×4 IMPLANT
TUBE CONNECTING 12X1/4 (SUCTIONS) ×2 IMPLANT
UNDERPAD 30X36 HEAVY ABSORB (UNDERPADS AND DIAPERS) ×2 IMPLANT
WATER STERILE IRR 1000ML POUR (IV SOLUTION) ×4 IMPLANT
YANKAUER SUCT BULB TIP NO VENT (SUCTIONS) ×2 IMPLANT

## 2022-01-08 NOTE — Anesthesia Postprocedure Evaluation (Signed)
Anesthesia Post Note ? ?Patient: Kathryn Boyer ? ?Procedure(s) Performed: HARDWARE REMOVAL (Left) ? ?  ? ?Patient location during evaluation: PACU ?Anesthesia Type: General ?Level of consciousness: awake and alert ?Pain management: pain level controlled ?Vital Signs Assessment: post-procedure vital signs reviewed and stable ?Respiratory status: spontaneous breathing, nonlabored ventilation and respiratory function stable ?Cardiovascular status: blood pressure returned to baseline ?Postop Assessment: no apparent nausea or vomiting ?Anesthetic complications: no ? ? ?No notable events documented. ? ?Last Vitals:  ?Vitals:  ? 01/08/22 1030 01/08/22 1045  ?BP: 128/67 135/65  ?Pulse: 71 75  ?Resp: 14 13  ?Temp:  (!) 36.3 ?C  ?SpO2: 91% 95%  ?  ?Last Pain:  ?Vitals:  ? 01/08/22 1045  ?TempSrc:   ?PainSc: 7   ? ? ?  ?  ?  ?  ?  ?  ? ?Shanda Howells ? ? ? ? ?

## 2022-01-08 NOTE — Transfer of Care (Signed)
Immediate Anesthesia Transfer of Care Note ? ?Patient: Kathryn Boyer ? ?Procedure(s) Performed: HARDWARE REMOVAL (Left) ? ?Patient Location: PACU ? ?Anesthesia Type:General ? ?Level of Consciousness: drowsy ? ?Airway & Oxygen Therapy: Patient Spontanous Breathing ? ?Post-op Assessment: Report given to RN and Post -op Vital signs reviewed and stable ? ?Post vital signs: Reviewed and stable ? ?Last Vitals:  ?Vitals Value Taken Time  ?BP 105/55 01/08/22 0947  ?Temp    ?Pulse 71 01/08/22 0947  ?Resp 17 01/08/22 0947  ?SpO2 96 % 01/08/22 0947  ?Vitals shown include unvalidated device data. ? ?Last Pain:  ?Vitals:  ? 01/08/22 0625  ?TempSrc:   ?PainSc: 0-No pain  ?   ? ?  ? ?Complications: No notable events documented. ?

## 2022-01-08 NOTE — Op Note (Signed)
01/08/2022  10:34 AM  PATIENT:  Kathryn Boyer  65 y.o. female  PRE-OPERATIVE DIAGNOSIS:   1. SYMPTOMATIC HARDWARE LEFT ANKLE, TIBIA AND FIBULA 2. ANKLE PAIN, POSSIBLE FAILURE OF HEALING LEFT ANKLE SYNDESMOSIS  POST-OPERATIVE DIAGNOSIS:   1. SYMPTOMATIC HARDWARE LEFT ANKLE, TIBIA AND FIBULA 2. HEALED AND STABLE LEFT ANKLE SYNDESMOSIS  PROCEDURE:   1. REMOVAL OF DEEP IMPLANT LEFT TIBIA 2. REMOVAL OF DEEP IMPLANT LEFT FIBULA 3. MANUAL APPLICATION OF STRESS LEFT ANKLE  SURGEON:  Surgeon(s) and Role:    Myrene Galas, MD - Primary  PHYSICIAN ASSISTANT: None.  ANESTHESIA:   general  I/O:  Total I/O In: 1100 [I.V.:1000; IV Piggyback:100] Out: 30 [Blood:30]  SPECIMEN:  No Specimen  TOURNIQUET:  * No tourniquets in log *  COMPLICATIONS: NONE  DICTATION: .Note written in EPIC  DISPOSITION: TO PACU  CONDITION: STABLE  DELAY START OF DVT PROPHYLAXIS BECAUSE OF BLEEDING RISK: NO  BRIEF SUMMARY OF INDICATION FOR PROCEDURE:  Patient is a pleasant 66 y.o. who underwent plate fixation of a fracture with subsequent healing. Despite conservative measures, hardware related symptoms have persisted.  I discussed the risks and benefits of surgical removal including infection, nerve or vessel injury, failure to alleviate symptoms, occult nonunion, re-fracture, DVT, PE, and multiple others. She did wish to proceed.   BRIEF SUMMARY OF PROCEDURE:  The patient was taken to the operating room after administration of 2 g of Ancef.  General anesthesia was induced. The left lower extremity was prepped and draped in usual sterile fashion.  No tourniquet was used during the procedure.  C-arm was brought in to confirm position of the hardware.  I remade the old distal incision and dissected sharply down to the lateral fibula plate, elevating the soft tissues. I identified and removed all screws including those across the syndesmosis. I then exposed the posterior fibular plate and removed all of  those screws.  We then turned attention to the tibia, going posterior to the fibula to access the posterior buttress plate and carefully removing all screws there. I then made a separate incision over the medial malleolus of the tibia and removed those screws.   X-rays confirmed removal of all hardware and a healed fracture. Because of the patient's ankle pain and prior syndesmosis injury suggesting possible instability, a stress evaluation was performed, consisting of external rotation of the ankle while holding it in the mortise view. Under live fluoro, I did not identify  any widening of the medial clear space nor widening of the syndesmotic interval. Consequently it was deemed stable.  The wounds were irrigated thoroughly and closed in standard fashion with vicryl and nylon. A sterile gently compressive dressing was applied.  The patient was taken to the PACU in stable condition.     PROGNOSIS: [name] with aggressive active and passive motion of the knee and ankle. Bleeding would be anticipated. She may change or remove his dressing in 48 hours and shower. Patient will follow up in 10 days for removal of sutures.       Doralee Albino. Carola Frost, M.D.

## 2022-01-08 NOTE — Anesthesia Procedure Notes (Signed)
Procedure Name: LMA Insertion ?Date/Time: 01/08/2022 8:29 AM ?Performed by: Vonna Drafts, CRNA ?Pre-anesthesia Checklist: Patient identified, Emergency Drugs available, Suction available, Patient being monitored and Timeout performed ?Patient Re-evaluated:Patient Re-evaluated prior to induction ?Oxygen Delivery Method: Circle system utilized ?Preoxygenation: Pre-oxygenation with 100% oxygen ?Induction Type: IV induction ?LMA: LMA inserted ?LMA Size: 4.0 ?Number of attempts: 1 ?Tube secured with: Tape ?Dental Injury: Teeth and Oropharynx as per pre-operative assessment  ? ? ? ? ?

## 2022-01-08 NOTE — Discharge Instructions (Addendum)
? ?Orthopaedic Trauma Service Discharge Instructions ? ? ?General Discharge Instructions ? ?WEIGHT BEARING STATUS: Weightbearing as tolerated Left leg. May need to use crutches or a walker for a few days due to soreness  ? ?RANGE OF MOTION/ACTIVITY: motion as tolerated left ankle. Slowly increase activity level ? ?Wound Care: daily wound care starting on 01/10/2022.  See below ? ?Discharge Wound Care Instructions ? ?Do NOT apply any ointments, solutions or lotions to pin sites or surgical wounds.  These prevent needed drainage and even though solutions like hydrogen peroxide kill bacteria, they also damage cells lining the pin sites that help fight infection.  Applying lotions or ointments can keep the wounds moist and can cause them to breakdown and open up as well. This can increase the risk for infection. When in doubt call the office. ? ?Surgical incisions should be dressed daily. ? ?If any drainage is noted, use one layer of adaptic or Mepitel, then gauze, Kerlix, and an ace wrap. ? ?[email or web address] ?[email or web address]-[email or web address]_[email or web address]&[email or web address] ? ?These dressing supplies should be available at local medical supply stores ([employer], [employer], etc). They are not usually carried at places like [facility], [facility], [employer], etc ? ?Once the incision is completely dry and without drainage, it may be left open to air out.  Showering may begin 36-48 hours later.  Cleaning gently with soap and water. ? ? ?Diet: as you were eating previously.  Can use over the counter stool softeners and bowel preparations, such as Miralax, to help with bowel movements.  Narcotics can be constipating.  Be sure to drink plenty of fluids ? ?PAIN MEDICATION USE AND EXPECTATIONS ? You have likely been given narcotic medications to help control your pain.  After a traumatic event that results in an  fracture (broken bone) with or without surgery, it is ok to use narcotic pain medications to help control one's pain.  We understand that everyone responds to pain differently and each individual patient will be evaluated on a regular basis for the continued need for narcotic medications. Ideally, narcotic medication use should last no more than 6-8 weeks (coinciding with fracture healing).  ? As a patient it is your responsibility as well to monitor narcotic medication use and report the amount and frequency you use these medications when you come to your office visit.  ? We would also advise that if you are using narcotic medications, you should take a dose prior to therapy to maximize you participation. ? ?IF YOU ARE ON NARCOTIC MEDICATIONS IT IS NOT PERMISSIBLE TO OPERATE A MOTOR VEHICLE (MOTORCYCLE/CAR/TRUCK/MOPED) OR HEAVY MACHINERY ?DO NOT MIX NARCOTICS WITH OTHER CNS (CENTRAL NERVOUS SYSTEM) DEPRESSANTS SUCH AS ALCOHOL ? ? ?POST-OPERATIVE OPIOID TAPER INSTRUCTIONS: ?It is important to wean off of your opioid medication as soon as possible. If you do not need pain medication after your surgery it is ok to stop day one. ?Opioids include: ?Codeine, Hydrocodone(Norco, Vicodin), Oxycodone(Percocet, oxycontin) and hydromorphone amongst others.  ?Long term and even short term use of opiods can cause: ?Increased pain response ?Dependence ?Constipation ?Depression ?Respiratory depression ?And more.  ?Withdrawal symptoms can include ?Flu like symptoms ?Nausea, vomiting ?And more ?Techniques to manage these symptoms ?Hydrate well ?Eat regular healthy meals ?Stay active ?Use relaxation techniques(deep breathing, meditating, yoga) ?Do Not substitute Alcohol to help with tapering ?If you have been on opioids for less than two weeks and do not have pain than it is ok to stop all together.  ?Plan to wean off of opioids ?This plan should start within  one week post op of your fracture surgery  ?Maintain the same interval or  time between taking each dose and first decrease the dose.  ?Cut the total daily intake of opioids by one tablet each day ?Next start to increase the time between doses. ?The last dose that should be eliminated is the evening dose.  ? ? ?STOP SMOKING OR USING NICOTINE PRODUCTS!!!! ? As discussed nicotine severely impairs your body's ability to heal surgical and traumatic wounds but also impairs bone healing.  Wounds and bone heal by forming microscopic blood vessels (angiogenesis) and nicotine is a vasoconstrictor (essentially, shrinks blood vessels).  Therefore, if vasoconstriction occurs to these microscopic blood vessels they essentially disappear and are unable to deliver necessary nutrients to the healing tissue.  This is one modifiable factor that you can do to dramatically increase your chances of healing your injury.   ? (This means no smoking, no nicotine gum, patches, etc) ? ? ?ICE AND ELEVATE INJURED/OPERATIVE EXTREMITY ? Using ice and elevating the injured extremity above your heart can help with swelling and pain control.  Icing in a pulsatile fashion, such as 20 minutes on and 20 minutes off, can be followed.   ? Do not place ice directly on skin. Make sure there is a barrier between to skin and the ice pack.   ? Using frozen items such as frozen peas works well as the conform nicely to the are that needs to be iced. ? ?USE AN ACE WRAP OR TED HOSE FOR SWELLING CONTROL ? In addition to icing and elevation, Ace wraps or TED hose are used to help limit and resolve swelling.  It is recommended to use Ace wraps or TED hose until you are informed to stop.   ? When using Ace Wraps start the wrapping distally (farthest away from the body) and wrap proximally (closer to the body) ?  Example: If you had surgery on your leg or thing and you do not have a splint on, start the ace wrap at the toes and work your way up to the thigh ?       If you had surgery on your upper extremity and do not have a splint on, start  the ace wrap at your fingers and work your way up to the upper arm ? ?IF YOU ARE IN A SPLINT OR CAST DO NOT REMOVE IT FOR ANY REASON  ? If your splint gets wet for any reason please contact the office immediately. You may shower in your splint or cast as long as you keep it dry.  This can be done by wrapping in a cast cover or garbage back (or similar) ? Do Not stick any thing down your splint or cast such as pencils, money, or hangers to try and scratch yourself with.  If you feel itchy take benadryl as prescribed on the bottle for itching ? ?IF YOU ARE IN A CAM BOOT (BLACK BOOT) ? You may remove boot periodically. Perform daily dressing changes as noted below.  Wash the liner of the boot regularly and wear a sock when wearing the boot. It is recommended that you sleep in the boot until told otherwise ? ? ? ?Call office for the following: ?Temperature greater than 101F ?Persistent nausea and vomiting ?Severe uncontrolled pain ?Redness, tenderness, or signs of infection (pain, swelling, redness, odor or green/yellow discharge around the site) ?Difficulty breathing, headache or visual disturbances ?Hives ?Persistent dizziness or light-headedness ?Extreme fatigue ?Any other questions or concerns you  may have after discharge ? ?In an emergency, call 911 or go to an Emergency Department at a nearby hospital ? ?HELPFUL INFORMATION ? ?If you had a block, it will wear off between 8-24 hrs postop typically.  This is period when your pain may go from nearly zero to the pain you would have had postop without the block.  This is an abrupt transition but nothing dangerous is happening.  You may take an extra dose of narcotic when this happens. ? ?You should wean off your narcotic medicines as soon as you are able.  Most patients will be off or using minimal narcotics before their first postop appointment.  ? ?We suggest you use the pain medication the first night prior to going to bed, in order to ease any pain when the  anesthesia wears off. You should avoid taking pain medications on an empty stomach as it will make you nauseous. ? ?Do not drink alcoholic beverages or take illicit drugs when taking pain medications. ? ?In most s

## 2022-01-09 ENCOUNTER — Encounter (HOSPITAL_COMMUNITY): Payer: Self-pay | Admitting: Orthopedic Surgery

## 2022-01-14 ENCOUNTER — Other Ambulatory Visit: Payer: Self-pay

## 2022-01-15 ENCOUNTER — Telehealth: Payer: Self-pay | Admitting: Pharmacy Technician

## 2022-01-15 NOTE — Telephone Encounter (Addendum)
Auth Submission: APPROVED Payer: [place] [place] Medication & CPT/J Code(s) submitted: EVENTIY [id number] Route of submission (phone, fax, portal): PHONE: [phone] Auth type: Buy/Bill Units/visits requested:210MG  Q4WKS Reference number: [facility] 01/15/22 @ 9:30a Approval from: 10/18/21 to 02/15/23.  New PA is NOTrequired for change in site or change with Dr. Beaulah Corin: [id number]

## 2022-01-22 DIAGNOSIS — S42252D Displaced fracture of greater tuberosity of left humerus, subsequent encounter for fracture with routine healing: Secondary | ICD-10-CM | POA: Diagnosis not present

## 2022-01-23 ENCOUNTER — Ambulatory Visit (INDEPENDENT_AMBULATORY_CARE_PROVIDER_SITE_OTHER): Payer: BC Managed Care – PPO

## 2022-01-23 VITALS — BP 127/81 | HR 82 | Temp 98.0°F | Resp 16 | Ht 67.0 in | Wt 170.6 lb

## 2022-01-23 DIAGNOSIS — T8484XD Pain due to internal orthopedic prosthetic devices, implants and grafts, subsequent encounter: Secondary | ICD-10-CM | POA: Diagnosis not present

## 2022-01-23 DIAGNOSIS — M8000XS Age-related osteoporosis with current pathological fracture, unspecified site, sequela: Secondary | ICD-10-CM

## 2022-01-23 DIAGNOSIS — M810 Age-related osteoporosis without current pathological fracture: Secondary | ICD-10-CM

## 2022-01-23 DIAGNOSIS — M8000XG Age-related osteoporosis with current pathological fracture, unspecified site, subsequent encounter for fracture with delayed healing: Secondary | ICD-10-CM

## 2022-01-23 MED ORDER — ROMOSOZUMAB-AQQG 105 MG/1.17ML ~~LOC~~ SOSY
210.0000 mg | PREFILLED_SYRINGE | Freq: Once | SUBCUTANEOUS | Status: AC
  Administered 2022-01-23: 210 mg via SUBCUTANEOUS

## 2022-01-23 NOTE — Progress Notes (Signed)
Diagnosis: Osteoporosis  Provider:  Praveen Mannam, MD  Procedure: Injection  Evenity (Romosozumab-aqqg), Dose: 210 mg, Site: subcutaneous, Number of injections: 2  Discharge: Condition: Good, Destination: Home . AVS provided to patient.   Performed by:  Tashe Purdon E Darolyn Double, LPN       

## 2022-01-24 DIAGNOSIS — H1032 Unspecified acute conjunctivitis, left eye: Secondary | ICD-10-CM | POA: Diagnosis not present

## 2022-01-30 ENCOUNTER — Other Ambulatory Visit: Payer: Self-pay | Admitting: Family Medicine

## 2022-01-30 DIAGNOSIS — E039 Hypothyroidism, unspecified: Secondary | ICD-10-CM

## 2022-01-31 DIAGNOSIS — S42309A Unspecified fracture of shaft of humerus, unspecified arm, initial encounter for closed fracture: Secondary | ICD-10-CM | POA: Diagnosis not present

## 2022-01-31 DIAGNOSIS — S82142A Displaced bicondylar fracture of left tibia, initial encounter for closed fracture: Secondary | ICD-10-CM | POA: Diagnosis not present

## 2022-01-31 DIAGNOSIS — T07XXXA Unspecified multiple injuries, initial encounter: Secondary | ICD-10-CM | POA: Diagnosis not present

## 2022-01-31 DIAGNOSIS — M810 Age-related osteoporosis without current pathological fracture: Secondary | ICD-10-CM | POA: Diagnosis not present

## 2022-02-20 ENCOUNTER — Ambulatory Visit: Payer: BC Managed Care – PPO

## 2022-02-22 ENCOUNTER — Other Ambulatory Visit: Payer: Self-pay | Admitting: Family Medicine

## 2022-02-22 DIAGNOSIS — E785 Hyperlipidemia, unspecified: Secondary | ICD-10-CM

## 2022-02-25 DIAGNOSIS — M810 Age-related osteoporosis without current pathological fracture: Secondary | ICD-10-CM | POA: Diagnosis not present

## 2022-03-02 DIAGNOSIS — S42309A Unspecified fracture of shaft of humerus, unspecified arm, initial encounter for closed fracture: Secondary | ICD-10-CM | POA: Diagnosis not present

## 2022-03-02 DIAGNOSIS — M810 Age-related osteoporosis without current pathological fracture: Secondary | ICD-10-CM | POA: Diagnosis not present

## 2022-03-02 DIAGNOSIS — T07XXXA Unspecified multiple injuries, initial encounter: Secondary | ICD-10-CM | POA: Diagnosis not present

## 2022-03-02 DIAGNOSIS — S82142A Displaced bicondylar fracture of left tibia, initial encounter for closed fracture: Secondary | ICD-10-CM | POA: Diagnosis not present

## 2022-03-04 ENCOUNTER — Telehealth: Payer: Self-pay | Admitting: Family Medicine

## 2022-03-04 MED ORDER — VITAMIN D (ERGOCALCIFEROL) 1.25 MG (50000 UNIT) PO CAPS: 50000.0000 [IU] | ORAL_CAPSULE | ORAL | 1 refills | Status: DC

## 2022-03-04 NOTE — Telephone Encounter (Signed)
Refill sent.

## 2022-03-04 NOTE — Telephone Encounter (Signed)
Medication: cholecalciferol (VITAMIN D) 25 MCG tablet   Has the patient contacted their pharmacy? No.  Preferred Pharmacy: [id number] - [place], [place] - [address] AT [facility] [address]   [address], [place]  [address]  Phone:  [phone]  Fax:  [phone]

## 2022-03-04 NOTE — Telephone Encounter (Signed)
Okay to refill? Rx was filled by a different provider and Vit D hasn't been checked since 06/2021

## 2022-04-02 DIAGNOSIS — T07XXXA Unspecified multiple injuries, initial encounter: Secondary | ICD-10-CM | POA: Diagnosis not present

## 2022-04-02 DIAGNOSIS — S82142A Displaced bicondylar fracture of left tibia, initial encounter for closed fracture: Secondary | ICD-10-CM | POA: Diagnosis not present

## 2022-04-02 DIAGNOSIS — S42309A Unspecified fracture of shaft of humerus, unspecified arm, initial encounter for closed fracture: Secondary | ICD-10-CM | POA: Diagnosis not present

## 2022-04-02 DIAGNOSIS — M810 Age-related osteoporosis without current pathological fracture: Secondary | ICD-10-CM | POA: Diagnosis not present

## 2022-04-16 ENCOUNTER — Encounter: Payer: Self-pay | Admitting: Family Medicine

## 2022-04-16 ENCOUNTER — Ambulatory Visit (INDEPENDENT_AMBULATORY_CARE_PROVIDER_SITE_OTHER): Payer: BC Managed Care – PPO

## 2022-04-16 VITALS — BP 136/82 | HR 64 | Temp 97.8°F | Resp 18 | Ht 67.0 in | Wt 179.8 lb

## 2022-04-16 DIAGNOSIS — R928 Other abnormal and inconclusive findings on diagnostic imaging of breast: Secondary | ICD-10-CM | POA: Diagnosis not present

## 2022-04-16 DIAGNOSIS — M8000XG Age-related osteoporosis with current pathological fracture, unspecified site, subsequent encounter for fracture with delayed healing: Secondary | ICD-10-CM

## 2022-04-16 DIAGNOSIS — N6012 Diffuse cystic mastopathy of left breast: Secondary | ICD-10-CM | POA: Diagnosis not present

## 2022-04-16 MED ORDER — ROMOSOZUMAB-AQQG 105 MG/1.17ML ~~LOC~~ SOSY
210.0000 mg | PREFILLED_SYRINGE | Freq: Once | SUBCUTANEOUS | Status: AC
  Administered 2022-04-16: 210 mg via SUBCUTANEOUS

## 2022-04-16 NOTE — Progress Notes (Cosign Needed Addendum)
Diagnosis: Osteoporosis  Provider:  Praveen Mannam MD  Procedure: Injection  Evenity (Romosozumab-aqqg), Dose: 210 mg, Site: subcutaneous, Number of injections: 2  Discharge: Condition: Good, Destination: Home . AVS provided to patient.   Performed by:  Callista Hoh E Harper Vandervoort, LPN       

## 2022-04-17 ENCOUNTER — Ambulatory Visit: Payer: BC Managed Care – PPO

## 2022-04-23 ENCOUNTER — Encounter: Payer: Self-pay | Admitting: Family Medicine

## 2022-04-23 ENCOUNTER — Ambulatory Visit (INDEPENDENT_AMBULATORY_CARE_PROVIDER_SITE_OTHER): Payer: BC Managed Care – PPO | Admitting: Family Medicine

## 2022-04-23 VITALS — BP 112/86 | HR 66 | Temp 98.0°F | Resp 18 | Ht 67.0 in | Wt 181.0 lb

## 2022-04-23 DIAGNOSIS — I10 Essential (primary) hypertension: Secondary | ICD-10-CM

## 2022-04-23 DIAGNOSIS — E663 Overweight: Secondary | ICD-10-CM

## 2022-04-23 DIAGNOSIS — E039 Hypothyroidism, unspecified: Secondary | ICD-10-CM | POA: Diagnosis not present

## 2022-04-23 DIAGNOSIS — F419 Anxiety disorder, unspecified: Secondary | ICD-10-CM

## 2022-04-23 DIAGNOSIS — M8000XG Age-related osteoporosis with current pathological fracture, unspecified site, subsequent encounter for fracture with delayed healing: Secondary | ICD-10-CM

## 2022-04-23 DIAGNOSIS — F411 Generalized anxiety disorder: Secondary | ICD-10-CM

## 2022-04-23 DIAGNOSIS — E785 Hyperlipidemia, unspecified: Secondary | ICD-10-CM

## 2022-04-23 HISTORY — DX: Overweight: E66.3

## 2022-04-23 MED ORDER — ALPRAZOLAM 0.5 MG PO TABS: 0.5000 mg | ORAL_TABLET | Freq: Three times a day (TID) | ORAL | 0 refills | Status: DC | PRN

## 2022-04-23 MED ORDER — WEGOVY 0.25 MG/0.5ML ~~LOC~~ SOAJ: 0.2500 mg | SUBCUTANEOUS | 0 refills | Status: DC

## 2022-04-23 NOTE — Assessment & Plan Note (Signed)
Will try wegovy  con't with diet and exercise

## 2022-04-23 NOTE — Assessment & Plan Note (Signed)
On evenity

## 2022-04-23 NOTE — Assessment & Plan Note (Signed)
stable °

## 2022-04-23 NOTE — Assessment & Plan Note (Signed)
Well controlled, no changes to meds. Encouraged heart healthy diet such as the DASH diet and exercise as tolerated.  °

## 2022-04-23 NOTE — Patient Instructions (Signed)

## 2022-04-23 NOTE — Progress Notes (Signed)
Established Patient Office Visit  Subjective   Patient ID: Kathryn Boyer, female    DOB: 22-Dec-1955  Age: 66 y.o. MRN: [id number]  Chief Complaint  Patient presents with   Hypothyroidism   Follow-up    HPI Pt is here c/o weight gain.  She would like her thyroid checked.  She is exercising everyday and feels like she is eating well.   No other complaints  Patient Active Problem List   Diagnosis Date Noted   Overweight (BMI 25.0-29.9) 04/23/2022   Preventative health care 10/26/2021   Pressure injury of skin of sacral region 08/10/2021   Acute cystitis without hematuria 08/10/2021   Acute blood loss anemia    History of hypertension    Drug induced constipation    Postoperative pain    Critical polytrauma 07/27/2021   Humerus fracture 07/21/2021   Tibial plateau fracture, left 07/21/2021   Nondisplaced fracture of fifth metatarsal bone, right foot, initial encounter for closed fracture 07/21/2021   Nondisplaced fracture of lateral malleolus of right fibula, initial encounter for closed fracture 07/21/2021   Left trimalleolar fracture, closed, initial encounter 07/21/2021   Fall 07/20/2021   Urine frequency 04/29/2019   Chest pain 04/29/2019   Urinary frequency 04/29/2019   Post-menopausal 04/29/2019   Allergic rhinitis 10/13/2018   Mild intermittent asthma with acute exacerbation 10/13/2018   Gastroesophageal reflux disease 10/13/2018   Right hip pain 06/01/2018   Chronic low back pain 05/25/2018   Acute pain of right knee 05/25/2018   Thoracic aorta atherosclerosis (HCC) 05/25/2018   Generalized anxiety disorder 03/22/2017   Hyperlipidemia 03/22/2017   Hypothyroidism 03/22/2017   Osteoporosis 12/20/2016   Encounter for counseling 09/18/2016   Vaginal dryness 02/06/2016   Allergic rhinitis due to pollen 02/01/2016   Acute maxillary sinusitis 02/01/2016   Essential hypertension 02/01/2016   Asthma with acute exacerbation 02/01/2016   Lumbar radiculopathy, acute  11/01/2015   Osteoporosis, 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   Adjustment disorder with anxiety 02/08/2015   Abnormal weight gain 02/08/2015   Past Medical History:  Diagnosis Date   Anxiety    Asthma    GERD (gastroesophageal reflux disease)    Hyperlipidemia    Hypertension    Plantar fasciitis    Thyroid disease    Past Surgical History:  Procedure Laterality Date   ABDOMINAL HYSTERECTOMY     BREAST SURGERY     HARDWARE REMOVAL Left 01/08/2022   Procedure: HARDWARE REMOVAL;  Surgeon: Myrene Galas, MD;  Location: [facility] OR;  Service: Orthopedics;  Laterality: Left;   NOSE SURGERY     ORIF ANKLE FRACTURE Left 07/23/2021   Procedure: OPEN REDUCTION INTERNAL FIXATION (ORIF) ANKLE FRACTURE;  Surgeon: Myrene Galas, MD;  Location: [facility] OR;  Service: Orthopedics;  Laterality: Left;   REVERSE SHOULDER ARTHROPLASTY Left 07/25/2021   Procedure: LEFT REVERSE SHOULDER ARTHROPLASTY;  Surgeon: Bjorn Pippin, MD;  Location: [facility] OR;  Service: Orthopedics;  Laterality: Left;   Social History   Tobacco Use   Smoking status: Never   Smokeless tobacco: Never  Vaping Use   Vaping Use: Never used  Substance Use Topics   Alcohol use: No   Drug use: No   Social History   Socioeconomic History   Marital status: Married    Spouse name: Not on file   Number of children: Not on file   Years of education: Not on file   Highest education level: Not on file  Occupational History   Not on file  Tobacco Use   Smoking  status: Never   Smokeless tobacco: Never  Vaping Use   Vaping Use: Never used  Substance and Sexual Activity   Alcohol use: No   Drug use: No   Sexual activity: Not on file  Other Topics Concern   Not on file  Social History Narrative   Exercise-sometimes   Social Determinants of Health   Financial Resource Strain: Not on file  Food Insecurity: Not on file  Transportation Needs: Not on file  Physical Activity: Not on file  Stress: Not on file  Social Connections: Not on file   Intimate Partner Violence: Not on file   Family Status  Relation Name Status   Mother  Deceased   Father  Deceased   Sister  (Not Specified)   Family History  Problem Relation Age of Onset   Heart disease Mother    Stroke Mother    Hypertension Mother    Cancer Mother        ovarian   Aneurysm Father    Hypertension Father    Heart disease Father    Kidney disease Father    Heart disease Sister    Allergies  Allergen Reactions   Contrast Media [Iodinated Contrast Media] Hives   Isovue [Iopamidol] Hives    After returning home pt called to inform technologist of rash to face and neck, denies difficulty breathing or swallowing, pt took 2 benedryl Will need premeds in future   Amoxicillin Other (See Comments)    Headache  Cephalosporin tolerant 07/23/2021   Belviq Xr [Lorcaserin Hcl Er] Itching    Caused her face to turn red.   Saxenda [Liraglutide -Weight Management] Other (See Comments)    Caused indigestion.   Penicillins Rash    Cephalosporin Tolerant 07/23/2021       ROS    Objective:     BP 112/86 (BP Location: Left Arm, Patient Position: Sitting, Cuff Size: Normal)   Pulse 66   Temp 98 F (36.7 C) (Oral)   Resp 18   Ht 5\' 7"  (1.702 m)   Wt 181 lb (82.1 kg)   SpO2 97%   BMI 28.35 kg/m  BP Readings from Last 3 Encounters:  04/23/22 112/86  04/16/22 136/82  01/23/22 127/81   Wt Readings from Last 3 Encounters:  04/23/22 181 lb (82.1 kg)  04/16/22 179 lb 12.8 oz (81.6 kg)  01/23/22 170 lb 9.6 oz (77.4 kg)   SpO2 Readings from Last 3 Encounters:  04/23/22 97%  04/16/22 99%  01/23/22 98%      Physical Exam   No results found for any visits on 04/23/22.  Last CBC Lab Results  Component Value Date   WBC 6.8 01/08/2022   HGB 14.8 01/08/2022   HCT 44.1 01/08/2022   MCV 92.1 01/08/2022   MCH 30.9 01/08/2022   RDW 13.4 01/08/2022   PLT 248 01/08/2022   Last metabolic panel Lab Results  Component Value Date   GLUCOSE 109 (H)  01/08/2022   NA 139 01/08/2022   K 3.9 01/08/2022   CL 108 01/08/2022   CO2 23 01/08/2022   BUN 10 01/08/2022   CREATININE 0.96 01/08/2022   GFRNONAA >60 01/08/2022   CALCIUM 9.1 01/08/2022   PROT 7.2 10/26/2021   ALBUMIN 4.6 10/26/2021   BILITOT 0.6 10/26/2021   ALKPHOS 97 10/26/2021   AST 18 10/26/2021   ALT 14 10/26/2021   ANIONGAP 8 01/08/2022   Last lipids Lab Results  Component Value Date   CHOL 171 10/26/2021  HDL 46.80 10/26/2021   LDLCALC 99 10/26/2021   TRIG 126.0 10/26/2021   CHOLHDL 4 10/26/2021   Last hemoglobin A1c No results found for: "HGBA1C" Last thyroid functions Lab Results  Component Value Date   TSH 0.11 (L) 10/26/2021   T4TOTAL 10.3 02/04/2019   Last vitamin D Lab Results  Component Value Date   VD25OH 39.79 07/24/2021   Last vitamin B12 and Folate No results found for: "VITAMINB12", "FOLATE"    The 10-year ASCVD risk score (Arnett DK, et al., 2019) is: 7%    Assessment & Plan:   Problem List Items Addressed This Visit       Unprioritized   Hypothyroidism - Primary   Relevant Orders   Thyroid Panel With TSH   Comprehensive metabolic panel   Hyperlipidemia   Overweight (BMI 25.0-29.9)    Will try wegovy  con't with diet and exercise       Relevant Medications   Semaglutide-Weight Management (WEGOVY) 0.25 MG/0.5ML SOAJ   Other Relevant Orders   Hemoglobin A1c   Insulin, random   Lipid panel   Thyroid Panel With TSH   Vitamin B12   VITAMIN D 25 Hydroxy (Vit-D Deficiency, Fractures)   Comprehensive metabolic panel   Osteoporosis    On evenity      Generalized anxiety disorder    stable      Relevant Medications   ALPRAZolam (XANAX) 0.5 MG tablet   Essential hypertension    Well controlled, no changes to meds. Encouraged heart healthy diet such as the DASH diet and exercise as tolerated.       Other Visit Diagnoses     Anxiety       Relevant Medications   ALPRAZolam (XANAX) 0.5 MG tablet   Primary  hypertension       Relevant Orders   Hemoglobin A1c   Insulin, random   Lipid panel   Comprehensive metabolic panel       Return in about 6 months (around 10/24/2022), or if symptoms worsen or fail to improve, for annual exam, fasting.    Donato Schultz, DO

## 2022-04-25 ENCOUNTER — Other Ambulatory Visit (INDEPENDENT_AMBULATORY_CARE_PROVIDER_SITE_OTHER): Payer: BC Managed Care – PPO

## 2022-04-25 DIAGNOSIS — I10 Essential (primary) hypertension: Secondary | ICD-10-CM | POA: Diagnosis not present

## 2022-04-25 DIAGNOSIS — E039 Hypothyroidism, unspecified: Secondary | ICD-10-CM

## 2022-04-25 DIAGNOSIS — E663 Overweight: Secondary | ICD-10-CM | POA: Diagnosis not present

## 2022-04-25 LAB — LIPID PANEL
Cholesterol: 195 mg/dL (ref 0–200)
HDL: 56.8 mg/dL (ref >39.00)
LDL Cholesterol: 117 mg/dL — ABNORMAL HIGH (ref 0–99)
NonHDL: 138.08
Total CHOL/HDL Ratio: 3
Triglycerides: 105 mg/dL (ref 0.0–149.0)
VLDL: 21 mg/dL (ref 0.0–40.0)

## 2022-04-25 LAB — COMPREHENSIVE METABOLIC PANEL
ALT: 16 U/L (ref 0–35)
AST: 19 U/L (ref 0–37)
Albumin: 4.5 g/dL (ref 3.5–5.2)
Alkaline Phosphatase: 98 U/L (ref 39–117)
BUN: 15 mg/dL (ref 6–23)
CO2: 26 mEq/L (ref 19–32)
Calcium: 9.3 mg/dL (ref 8.4–10.5)
Chloride: 105 mEq/L (ref 96–112)
Creatinine, Ser: 0.89 mg/dL (ref 0.40–1.20)
GFR: 67.27 mL/min (ref >60.00)
Glucose, Bld: 94 mg/dL (ref 70–99)
Potassium: 4.5 mEq/L (ref 3.5–5.1)
Sodium: 140 mEq/L (ref 135–145)
Total Bilirubin: 0.8 mg/dL (ref 0.2–1.2)
Total Protein: 7.2 g/dL (ref 6.0–8.3)

## 2022-04-25 LAB — HEMOGLOBIN A1C: Hgb A1c MFr Bld: 5.9 % (ref 4.6–6.5)

## 2022-04-25 LAB — VITAMIN B12: Vitamin B-12: 393 pg/mL (ref 211–911)

## 2022-04-25 LAB — VITAMIN D 25 HYDROXY (VIT D DEFICIENCY, FRACTURES): VITD: 45.95 ng/mL (ref 30.00–100.00)

## 2022-04-26 LAB — THYROID PANEL WITH TSH
Free Thyroxine Index: 2.6 (ref 1.4–3.8)
T3 Uptake: 29 % (ref 22–35)
T4, Total: 8.8 ug/dL (ref 5.1–11.9)
TSH: 2.72 mIU/L (ref 0.40–4.50)

## 2022-04-26 LAB — INSULIN, RANDOM: Insulin: 14 u[IU]/mL

## 2022-04-30 ENCOUNTER — Ambulatory Visit (INDEPENDENT_AMBULATORY_CARE_PROVIDER_SITE_OTHER): Payer: BC Managed Care – PPO | Admitting: Family Medicine

## 2022-04-30 ENCOUNTER — Encounter: Payer: Self-pay | Admitting: Family Medicine

## 2022-04-30 ENCOUNTER — Encounter: Payer: Self-pay | Admitting: Pulmonary Disease

## 2022-04-30 ENCOUNTER — Other Ambulatory Visit (HOSPITAL_BASED_OUTPATIENT_CLINIC_OR_DEPARTMENT_OTHER): Payer: Self-pay

## 2022-04-30 DIAGNOSIS — E663 Overweight: Secondary | ICD-10-CM | POA: Diagnosis not present

## 2022-04-30 DIAGNOSIS — E039 Hypothyroidism, unspecified: Secondary | ICD-10-CM

## 2022-04-30 DIAGNOSIS — Z8679 Personal history of other diseases of the circulatory system: Secondary | ICD-10-CM

## 2022-04-30 DIAGNOSIS — E785 Hyperlipidemia, unspecified: Secondary | ICD-10-CM | POA: Diagnosis not present

## 2022-04-30 MED ORDER — WEGOVY 0.25 MG/0.5ML ~~LOC~~ SOAJ: 0.2500 mg | SUBCUTANEOUS | 0 refills | Status: DC

## 2022-04-30 MED ORDER — WEGOVY 0.25 MG/0.5ML ~~LOC~~ SOAJ
0.2500 mg | SUBCUTANEOUS | 0 refills | Status: DC
  Filled 2022-04-30: qty 2, fill #0
  Filled 2022-05-14 – 2022-05-16 (×2): qty 2, 28d supply, fill #0

## 2022-04-30 NOTE — Assessment & Plan Note (Signed)
Tolerating statin, encouraged heart healthy diet, avoid trans fats, minimize simple carbs and saturated fats. Increase exercise as tolerated  Lab Results  Component Value Date   CHOL 195 04/25/2022   HDL 56.80 04/25/2022   LDLCALC 117 (H) 04/25/2022   TRIG 105.0 04/25/2022   CHOLHDL 3 04/25/2022

## 2022-04-30 NOTE — Assessment & Plan Note (Signed)
Well controlled, no changes to meds. Encouraged heart healthy diet such as the DASH diet and exercise as tolerated.  °

## 2022-04-30 NOTE — Progress Notes (Signed)
Subjective:   By signing my name below, I, [name], attest that this documentation has been prepared under the direction and in the presence of Dr. Seabron Spates, DO. 04/30/2022     Patient ID: Kathryn Boyer, female    DOB: [birth year], 66 y.o.   MRN: [id number]  Chief Complaint  Patient presents with   Discuss labs    Pt was seen 1 week ago. Pt would like to discuss labs     HPI Patient is in today for a follow up visit.   Her cholesterol levels have improve since her last lab work. She continues exercising regularly and is managing a healthy diet.  Lab Results  Component Value Date   CHOL 195 04/25/2022   HDL 56.80 04/25/2022   LDLCALC 117 (H) 04/25/2022   TRIG 105.0 04/25/2022   CHOLHDL 3 04/25/2022   She continues taking vitamin D supplements regularly.    Past Medical History:  Diagnosis Date   Anxiety    Asthma    GERD (gastroesophageal reflux disease)    Hyperlipidemia    Hypertension    Plantar fasciitis    Thyroid disease     Past Surgical History:  Procedure Laterality Date   ABDOMINAL HYSTERECTOMY     BREAST SURGERY     HARDWARE REMOVAL Left 01/08/2022   Procedure: HARDWARE REMOVAL;  Surgeon: Myrene Galas, MD;  Location: [facility] OR;  Service: Orthopedics;  Laterality: Left;   NOSE SURGERY     ORIF ANKLE FRACTURE Left 07/23/2021   Procedure: OPEN REDUCTION INTERNAL FIXATION (ORIF) ANKLE FRACTURE;  Surgeon: Myrene Galas, MD;  Location: [facility] OR;  Service: Orthopedics;  Laterality: Left;   REVERSE SHOULDER ARTHROPLASTY Left 07/25/2021   Procedure: LEFT REVERSE SHOULDER ARTHROPLASTY;  Surgeon: Bjorn Pippin, MD;  Location: [facility] OR;  Service: Orthopedics;  Laterality: Left;    Family History  Problem Relation Age of Onset   Heart disease Mother    Stroke Mother    Hypertension Mother    Cancer Mother        ovarian   Aneurysm Father    Hypertension Father    Heart disease Father    Kidney disease Father    Heart disease Sister     Social  History   Socioeconomic History   Marital status: Married    Spouse name: Not on file   Number of children: Not on file   Years of education: Not on file   Highest education level: Not on file  Occupational History   Not on file  Tobacco Use   Smoking status: Never   Smokeless tobacco: Never  Vaping Use   Vaping Use: Never used  Substance and Sexual Activity   Alcohol use: No   Drug use: No   Sexual activity: Not on file  Other Topics Concern   Not on file  Social History Narrative   Exercise-sometimes   Social Determinants of Health   Financial Resource Strain: Not on file  Food Insecurity: Not on file  Transportation Needs: Not on file  Physical Activity: Not on file  Stress: Not on file  Social Connections: Not on file  Intimate Partner Violence: Not on file    Outpatient Medications Prior to Visit  Medication Sig Dispense Refill   ALPRAZolam (XANAX) 0.5 MG tablet Take 1 tablet (0.5 mg total) by mouth 3 (three) times daily as needed for anxiety. 30 tablet 0   Camphor-Menthol-Methyl Sal (SALONPAS) 3.08-31-08 % PTCH Apply 5 patches topically daily.  cyclobenzaprine (FLEXERIL) 5 MG tablet Take 5 mg by mouth every 8 (eight) hours as needed for pain.     diclofenac Sodium (VOLTAREN) 1 % GEL Apply 2 g topically daily as needed (pain).     diphenhydrAMINE (BENADRYL) 25 MG tablet Take 25 mg by mouth every 6 (six) hours as needed for allergies.     estradiol (ESTRACE) 0.1 MG/GM vaginal cream Place 1 Applicatorful vaginally 3 (three) times a week. 42.5 g 3   famotidine-calcium carbonate-magnesium hydroxide (PEPCID COMPLETE) 10-800-165 MG chewable tablet Chew 1 tablet by mouth daily as needed (Heartburn).     ibuprofen (ADVIL) 200 MG tablet Take 600 mg by mouth every 6 (six) hours as needed for mild pain or moderate pain.     levothyroxine (SYNTHROID) 88 MCG tablet TAKE 1 TABLET(88 MCG) BY MOUTH DAILY 90 tablet 1   Multiple Vitamins-Minerals (MULTIVITAMIN WITH MINERALS) tablet  Take 1 tablet by mouth daily. V thrive     olmesartan (BENICAR) 40 MG tablet TAKE 1 TABLET(40 MG) BY MOUTH DAILY 90 tablet 1   ondansetron (ZOFRAN-ODT) 4 MG disintegrating tablet Take 1 tablet (4 mg total) by mouth every 8 (eight) hours as needed for nausea or vomiting. 20 tablet 0   OVER THE COUNTER MEDICATION Apply 1 application. topically daily as needed (Pain). Body Balm     oxyCODONE-acetaminophen (PERCOCET) 5-325 MG tablet Take 1-2 tablets by mouth every 8 (eight) hours as needed for severe pain. 40 tablet 0   pravastatin (PRAVACHOL) 20 MG tablet TAKE 1 TABLET(20 MG) BY MOUTH DAILY 90 tablet 1   Probiotic Product (PROBIOTIC PO) Take 1 capsule by mouth daily.     Romosozumab-aqqg (EVENITY) 105 MG/1. SOSY injection Inject 210 mg into the skin every 30 (thirty) days.     VENTOLIN HFA 108 (90 Base) MCG/ACT inhaler Inhale 2 puffs into the lungs every 4 (four) hours as needed for wheezing or shortness of breath. 18 g 1   Vitamin D, Ergocalciferol, (DRISDOL) 1.25 MG (50000 UNIT) CAPS capsule Take 1 capsule (50,000 Units total) by mouth every 7 (seven) days. 12 capsule 1   Semaglutide-Weight Management (WEGOVY) 0.25 MG/0.5ML SOAJ Inject 0.25 mg into the skin once a week. 2 mL 0   No facility-administered medications prior to visit.    Allergies  Allergen Reactions   Contrast Media [Iodinated Contrast Media] Hives   Isovue [Iopamidol] Hives    After returning home pt called to inform technologist of rash to face and neck, denies difficulty breathing or swallowing, pt took 2 benedryl Will need premeds in future   Amoxicillin Other (See Comments)    Headache  Cephalosporin tolerant 07/23/2021   Belviq Xr [Lorcaserin Hcl Er] Itching    Caused her face to turn red.   Saxenda [Liraglutide -Weight Management] Other (See Comments)    Caused indigestion.   Penicillins Rash    Cephalosporin Tolerant 07/23/2021     Review of Systems  Constitutional:  Negative for fever and malaise/fatigue.   HENT:  Negative for congestion.   Eyes:  Negative for blurred vision.  Respiratory:  Negative for cough and shortness of breath.   Cardiovascular:  Negative for chest pain, palpitations and leg swelling.  Gastrointestinal:  Negative for vomiting.  Musculoskeletal:  Negative for back pain.  Skin:  Negative for rash.  Neurological:  Negative for loss of consciousness and headaches.       Objective:    Physical Exam Vitals and nursing note reviewed.  Constitutional:      General:  She is not in acute distress.    Appearance: Normal appearance. She is well-developed. She is not ill-appearing.  HENT:     Head: Normocephalic and atraumatic.     Right Ear: External ear normal.     Left Ear: External ear normal.  Eyes:     Extraocular Movements: Extraocular movements intact.     Conjunctiva/sclera: Conjunctivae normal.     Pupils: Pupils are equal, round, and reactive to light.  Neck:     Thyroid: No thyromegaly.     Vascular: No carotid bruit or JVD.  Cardiovascular:     Rate and Rhythm: Normal rate and regular rhythm.     Heart sounds: Normal heart sounds. No murmur heard.    No gallop.  Pulmonary:     Effort: Pulmonary effort is normal. No respiratory distress.     Breath sounds: Normal breath sounds. No wheezing or rales.  Chest:     Chest wall: No tenderness.  Musculoskeletal:     Cervical back: Normal range of motion and neck supple.  Skin:    General: Skin is warm and dry.  Neurological:     Mental Status: She is alert and oriented to person, place, and time.  Psychiatric:        Judgment: Judgment normal.     BP 126/70 (BP Location: Left Arm, Patient Position: Sitting, Cuff Size: Normal)   Pulse 70   Temp 97.6 F (36.4 C) (Oral)   Resp 18   Ht 5\' 7"  (1.702 m)   Wt 181 lb (82.1 kg)   SpO2 97%   BMI 28.35 kg/m  Wt Readings from Last 3 Encounters:  04/30/22 181 lb (82.1 kg)  04/23/22 181 lb (82.1 kg)  04/16/22 179 lb 12.8 oz (81.6 kg)    Diabetic Foot  Exam - Simple   No data filed    Lab Results  Component Value Date   WBC 6.8 01/08/2022   HGB 14.8 01/08/2022   HCT 44.1 01/08/2022   PLT 248 01/08/2022   GLUCOSE 94 04/25/2022   CHOL 195 04/25/2022   TRIG 105.0 04/25/2022   HDL 56.80 04/25/2022   LDLCALC 117 (H) 04/25/2022   ALT 16 04/25/2022   AST 19 04/25/2022   NA 140 04/25/2022   K 4.5 04/25/2022   CL 105 04/25/2022   CREATININE 0.89 04/25/2022   BUN 15 04/25/2022   CO2 26 04/25/2022   TSH 2.72 04/25/2022   HGBA1C 5.9 04/25/2022    Lab Results  Component Value Date   TSH 2.72 04/25/2022   Lab Results  Component Value Date   WBC 6.8 01/08/2022   HGB 14.8 01/08/2022   HCT 44.1 01/08/2022   MCV 92.1 01/08/2022   PLT 248 01/08/2022   Lab Results  Component Value Date   NA 140 04/25/2022   K 4.5 04/25/2022   CO2 26 04/25/2022   GLUCOSE 94 04/25/2022   BUN 15 04/25/2022   CREATININE 0.89 04/25/2022   BILITOT 0.8 04/25/2022   ALKPHOS 98 04/25/2022   AST 19 04/25/2022   ALT 16 04/25/2022   PROT 7.2 04/25/2022   ALBUMIN 4.5 04/25/2022   CALCIUM 9.3 04/25/2022   ANIONGAP 8 01/08/2022   GFR 67.27 04/25/2022   Lab Results  Component Value Date   CHOL 195 04/25/2022   Lab Results  Component Value Date   HDL 56.80 04/25/2022   Lab Results  Component Value Date   LDLCALC 117 (H) 04/25/2022   Lab Results  Component Value Date  TRIG 105.0 04/25/2022   Lab Results  Component Value Date   CHOLHDL 3 04/25/2022   Lab Results  Component Value Date   HGBA1C 5.9 04/25/2022       Assessment & Plan:   Problem List Items Addressed This Visit       Unprioritized   Overweight (BMI 25.0-29.9)   Relevant Medications   Semaglutide-Weight Management (WEGOVY) 0.25 MG/0.5ML SOAJ   Hypothyroidism    . Lab Results  Component Value Date   TSH 2.72 04/25/2022  con't synthroid      Hyperlipidemia    Tolerating statin, encouraged heart healthy diet, avoid trans fats, minimize simple carbs and  saturated fats. Increase exercise as tolerated  Lab Results  Component Value Date   CHOL 195 04/25/2022   HDL 56.80 04/25/2022   LDLCALC 117 (H) 04/25/2022   TRIG 105.0 04/25/2022   CHOLHDL 3 04/25/2022        History of hypertension    Well controlled, no changes to meds. Encouraged heart healthy diet such as the DASH diet and exercise as tolerated.         Meds ordered this encounter  Medications   DISCONTD: Semaglutide-Weight Management (WEGOVY) 0.25 MG/0.5ML SOAJ    Sig: Inject 0.25 mg into the skin once a week.    Dispense:  2 mL    Refill:  0   Semaglutide-Weight Management (WEGOVY) 0.25 MG/0.5ML SOAJ    Sig: Inject 0.25 mg into the skin once a week.    Dispense:  2 mL    Refill:  0    I, Ann Held, DO, personally preformed the services described in this documentation.  All medical record entries made by the scribe were at my direction and in my presence.  I have reviewed the chart and discharge instructions (if applicable) and agree that the record reflects my personal performance and is accurate and complete. 04/30/2022   I,[name],acting as a scribe for Ann Held, DO.,have documented all relevant documentation on the behalf of Ann Held, DO,as directed by  Ann Held, DO while in the presence of Ann Held, DO.   Ann Held, DO

## 2022-04-30 NOTE — Assessment & Plan Note (Signed)
.   Lab Results  Component Value Date   TSH 2.72 04/25/2022   con't synthroid

## 2022-05-03 DIAGNOSIS — M810 Age-related osteoporosis without current pathological fracture: Secondary | ICD-10-CM | POA: Diagnosis not present

## 2022-05-03 DIAGNOSIS — T07XXXA Unspecified multiple injuries, initial encounter: Secondary | ICD-10-CM | POA: Diagnosis not present

## 2022-05-03 DIAGNOSIS — S42309A Unspecified fracture of shaft of humerus, unspecified arm, initial encounter for closed fracture: Secondary | ICD-10-CM | POA: Diagnosis not present

## 2022-05-03 DIAGNOSIS — S82142A Displaced bicondylar fracture of left tibia, initial encounter for closed fracture: Secondary | ICD-10-CM | POA: Diagnosis not present

## 2022-05-06 DIAGNOSIS — M25672 Stiffness of left ankle, not elsewhere classified: Secondary | ICD-10-CM | POA: Diagnosis not present

## 2022-05-06 DIAGNOSIS — Z4789 Encounter for other orthopedic aftercare: Secondary | ICD-10-CM | POA: Diagnosis not present

## 2022-05-06 DIAGNOSIS — M25612 Stiffness of left shoulder, not elsewhere classified: Secondary | ICD-10-CM | POA: Diagnosis not present

## 2022-05-06 DIAGNOSIS — R260 Ataxic gait: Secondary | ICD-10-CM | POA: Diagnosis not present

## 2022-05-07 DIAGNOSIS — R260 Ataxic gait: Secondary | ICD-10-CM | POA: Diagnosis not present

## 2022-05-07 DIAGNOSIS — M25612 Stiffness of left shoulder, not elsewhere classified: Secondary | ICD-10-CM | POA: Diagnosis not present

## 2022-05-07 DIAGNOSIS — M25672 Stiffness of left ankle, not elsewhere classified: Secondary | ICD-10-CM | POA: Diagnosis not present

## 2022-05-07 DIAGNOSIS — Z4789 Encounter for other orthopedic aftercare: Secondary | ICD-10-CM | POA: Diagnosis not present

## 2022-05-09 DIAGNOSIS — M25672 Stiffness of left ankle, not elsewhere classified: Secondary | ICD-10-CM | POA: Diagnosis not present

## 2022-05-09 DIAGNOSIS — M25612 Stiffness of left shoulder, not elsewhere classified: Secondary | ICD-10-CM | POA: Diagnosis not present

## 2022-05-09 DIAGNOSIS — Z4789 Encounter for other orthopedic aftercare: Secondary | ICD-10-CM | POA: Diagnosis not present

## 2022-05-09 DIAGNOSIS — R260 Ataxic gait: Secondary | ICD-10-CM | POA: Diagnosis not present

## 2022-05-13 DIAGNOSIS — M25672 Stiffness of left ankle, not elsewhere classified: Secondary | ICD-10-CM | POA: Diagnosis not present

## 2022-05-13 DIAGNOSIS — R260 Ataxic gait: Secondary | ICD-10-CM | POA: Diagnosis not present

## 2022-05-13 DIAGNOSIS — Z4789 Encounter for other orthopedic aftercare: Secondary | ICD-10-CM | POA: Diagnosis not present

## 2022-05-13 DIAGNOSIS — M25612 Stiffness of left shoulder, not elsewhere classified: Secondary | ICD-10-CM | POA: Diagnosis not present

## 2022-05-14 ENCOUNTER — Other Ambulatory Visit (HOSPITAL_BASED_OUTPATIENT_CLINIC_OR_DEPARTMENT_OTHER): Payer: Self-pay

## 2022-05-14 ENCOUNTER — Ambulatory Visit (INDEPENDENT_AMBULATORY_CARE_PROVIDER_SITE_OTHER): Payer: BC Managed Care – PPO

## 2022-05-14 VITALS — BP 129/81 | HR 64 | Temp 98.1°F | Resp 18 | Ht 67.0 in | Wt 180.0 lb

## 2022-05-14 DIAGNOSIS — Z4789 Encounter for other orthopedic aftercare: Secondary | ICD-10-CM | POA: Diagnosis not present

## 2022-05-14 DIAGNOSIS — M25672 Stiffness of left ankle, not elsewhere classified: Secondary | ICD-10-CM | POA: Diagnosis not present

## 2022-05-14 DIAGNOSIS — M8000XG Age-related osteoporosis with current pathological fracture, unspecified site, subsequent encounter for fracture with delayed healing: Secondary | ICD-10-CM

## 2022-05-14 DIAGNOSIS — R260 Ataxic gait: Secondary | ICD-10-CM | POA: Diagnosis not present

## 2022-05-14 DIAGNOSIS — M25612 Stiffness of left shoulder, not elsewhere classified: Secondary | ICD-10-CM | POA: Diagnosis not present

## 2022-05-14 DIAGNOSIS — M810 Age-related osteoporosis without current pathological fracture: Secondary | ICD-10-CM | POA: Diagnosis not present

## 2022-05-14 MED ORDER — ROMOSOZUMAB-AQQG 105 MG/1.17ML ~~LOC~~ SOSY
210.0000 mg | PREFILLED_SYRINGE | Freq: Once | SUBCUTANEOUS | Status: AC
  Administered 2022-05-14: 210 mg via SUBCUTANEOUS
  Filled 2022-05-14: qty 2.34

## 2022-05-14 NOTE — Progress Notes (Signed)
Diagnosis: Osteoporosis  Provider:  Praveen Mannam MD  Procedure: Injection  Evenity (Romosozumab-aqqg), Dose: 210 mg, Site: subcutaneous, Number of injections: 2  Discharge: Condition: Good, Destination: Home . AVS provided to patient.   Performed by:  Jerric Oyen, RN       

## 2022-05-15 ENCOUNTER — Other Ambulatory Visit (HOSPITAL_BASED_OUTPATIENT_CLINIC_OR_DEPARTMENT_OTHER): Payer: Self-pay

## 2022-05-15 MED FILL — Levothyroxine Sodium Tab 88 MCG: ORAL | 90 days supply | Qty: 90 | Fill #0 | Status: AC

## 2022-05-16 ENCOUNTER — Other Ambulatory Visit (HOSPITAL_BASED_OUTPATIENT_CLINIC_OR_DEPARTMENT_OTHER): Payer: Self-pay

## 2022-05-16 DIAGNOSIS — Z4789 Encounter for other orthopedic aftercare: Secondary | ICD-10-CM | POA: Diagnosis not present

## 2022-05-16 DIAGNOSIS — M25672 Stiffness of left ankle, not elsewhere classified: Secondary | ICD-10-CM | POA: Diagnosis not present

## 2022-05-16 DIAGNOSIS — M25612 Stiffness of left shoulder, not elsewhere classified: Secondary | ICD-10-CM | POA: Diagnosis not present

## 2022-05-16 DIAGNOSIS — R260 Ataxic gait: Secondary | ICD-10-CM | POA: Diagnosis not present

## 2022-05-17 ENCOUNTER — Other Ambulatory Visit (HOSPITAL_BASED_OUTPATIENT_CLINIC_OR_DEPARTMENT_OTHER): Payer: Self-pay

## 2022-05-17 DIAGNOSIS — R260 Ataxic gait: Secondary | ICD-10-CM | POA: Diagnosis not present

## 2022-05-17 DIAGNOSIS — M25672 Stiffness of left ankle, not elsewhere classified: Secondary | ICD-10-CM | POA: Diagnosis not present

## 2022-05-17 DIAGNOSIS — M25612 Stiffness of left shoulder, not elsewhere classified: Secondary | ICD-10-CM | POA: Diagnosis not present

## 2022-05-17 DIAGNOSIS — Z4789 Encounter for other orthopedic aftercare: Secondary | ICD-10-CM | POA: Diagnosis not present

## 2022-05-21 ENCOUNTER — Other Ambulatory Visit (HOSPITAL_BASED_OUTPATIENT_CLINIC_OR_DEPARTMENT_OTHER): Payer: Self-pay

## 2022-05-21 DIAGNOSIS — M25612 Stiffness of left shoulder, not elsewhere classified: Secondary | ICD-10-CM | POA: Diagnosis not present

## 2022-05-21 DIAGNOSIS — Z4789 Encounter for other orthopedic aftercare: Secondary | ICD-10-CM | POA: Diagnosis not present

## 2022-05-21 DIAGNOSIS — R260 Ataxic gait: Secondary | ICD-10-CM | POA: Diagnosis not present

## 2022-05-21 DIAGNOSIS — M25672 Stiffness of left ankle, not elsewhere classified: Secondary | ICD-10-CM | POA: Diagnosis not present

## 2022-05-22 ENCOUNTER — Other Ambulatory Visit (HOSPITAL_BASED_OUTPATIENT_CLINIC_OR_DEPARTMENT_OTHER): Payer: Self-pay

## 2022-05-23 ENCOUNTER — Other Ambulatory Visit (HOSPITAL_BASED_OUTPATIENT_CLINIC_OR_DEPARTMENT_OTHER): Payer: Self-pay

## 2022-05-27 ENCOUNTER — Other Ambulatory Visit: Payer: Self-pay | Admitting: Family Medicine

## 2022-05-28 ENCOUNTER — Other Ambulatory Visit (HOSPITAL_BASED_OUTPATIENT_CLINIC_OR_DEPARTMENT_OTHER): Payer: Self-pay

## 2022-05-29 ENCOUNTER — Other Ambulatory Visit (HOSPITAL_BASED_OUTPATIENT_CLINIC_OR_DEPARTMENT_OTHER): Payer: Self-pay

## 2022-06-02 DIAGNOSIS — S42309A Unspecified fracture of shaft of humerus, unspecified arm, initial encounter for closed fracture: Secondary | ICD-10-CM | POA: Diagnosis not present

## 2022-06-02 DIAGNOSIS — M810 Age-related osteoporosis without current pathological fracture: Secondary | ICD-10-CM | POA: Diagnosis not present

## 2022-06-02 DIAGNOSIS — T07XXXA Unspecified multiple injuries, initial encounter: Secondary | ICD-10-CM | POA: Diagnosis not present

## 2022-06-02 DIAGNOSIS — S82142A Displaced bicondylar fracture of left tibia, initial encounter for closed fracture: Secondary | ICD-10-CM | POA: Diagnosis not present

## 2022-06-03 ENCOUNTER — Other Ambulatory Visit (HOSPITAL_BASED_OUTPATIENT_CLINIC_OR_DEPARTMENT_OTHER): Payer: Self-pay

## 2022-06-04 ENCOUNTER — Other Ambulatory Visit (HOSPITAL_BASED_OUTPATIENT_CLINIC_OR_DEPARTMENT_OTHER): Payer: Self-pay

## 2022-06-04 DIAGNOSIS — M87180 Osteonecrosis due to drugs, jaw: Secondary | ICD-10-CM | POA: Diagnosis not present

## 2022-06-04 DIAGNOSIS — Z7983 Long term (current) use of bisphosphonates: Secondary | ICD-10-CM | POA: Diagnosis not present

## 2022-06-04 DIAGNOSIS — T458X5A Adverse effect of other primarily systemic and hematological agents, initial encounter: Secondary | ICD-10-CM | POA: Diagnosis not present

## 2022-06-05 ENCOUNTER — Other Ambulatory Visit (HOSPITAL_BASED_OUTPATIENT_CLINIC_OR_DEPARTMENT_OTHER): Payer: Self-pay

## 2022-06-06 ENCOUNTER — Other Ambulatory Visit (HOSPITAL_BASED_OUTPATIENT_CLINIC_OR_DEPARTMENT_OTHER): Payer: Self-pay

## 2022-06-10 ENCOUNTER — Other Ambulatory Visit (HOSPITAL_BASED_OUTPATIENT_CLINIC_OR_DEPARTMENT_OTHER): Payer: Self-pay

## 2022-06-10 DIAGNOSIS — M25561 Pain in right knee: Secondary | ICD-10-CM | POA: Diagnosis not present

## 2022-06-10 DIAGNOSIS — Z7983 Long term (current) use of bisphosphonates: Secondary | ICD-10-CM | POA: Diagnosis not present

## 2022-06-10 DIAGNOSIS — T458X5A Adverse effect of other primarily systemic and hematological agents, initial encounter: Secondary | ICD-10-CM | POA: Diagnosis not present

## 2022-06-10 DIAGNOSIS — M87180 Osteonecrosis due to drugs, jaw: Secondary | ICD-10-CM | POA: Diagnosis not present

## 2022-06-11 ENCOUNTER — Ambulatory Visit (INDEPENDENT_AMBULATORY_CARE_PROVIDER_SITE_OTHER): Payer: BC Managed Care – PPO

## 2022-06-11 ENCOUNTER — Telehealth (HOSPITAL_BASED_OUTPATIENT_CLINIC_OR_DEPARTMENT_OTHER): Payer: Self-pay

## 2022-06-11 ENCOUNTER — Other Ambulatory Visit (HOSPITAL_BASED_OUTPATIENT_CLINIC_OR_DEPARTMENT_OTHER): Payer: Self-pay | Admitting: Orthopaedic Surgery

## 2022-06-11 VITALS — BP 127/84 | HR 70 | Temp 98.2°F | Resp 16 | Ht 67.0 in | Wt 181.4 lb

## 2022-06-11 DIAGNOSIS — S83249A Other tear of medial meniscus, current injury, unspecified knee, initial encounter: Secondary | ICD-10-CM

## 2022-06-11 DIAGNOSIS — M8000XG Age-related osteoporosis with current pathological fracture, unspecified site, subsequent encounter for fracture with delayed healing: Secondary | ICD-10-CM

## 2022-06-11 MED ORDER — ROMOSOZUMAB-AQQG 105 MG/1.17ML ~~LOC~~ SOSY
210.0000 mg | PREFILLED_SYRINGE | Freq: Once | SUBCUTANEOUS | Status: AC
  Administered 2022-06-11: 210 mg via SUBCUTANEOUS

## 2022-06-11 NOTE — Progress Notes (Signed)
Diagnosis: Osteoporosis  Provider:  Praveen Mannam MD  Procedure: Injection  Evenity (Romosozumab-aqqg), Dose: 210 mg, Site: subcutaneous, Number of injections: 2  Discharge: Condition: Good, Destination: Home . AVS provided to patient.   Performed by:  Johntavious Francom, RN       

## 2022-06-12 ENCOUNTER — Ambulatory Visit: Payer: BC Managed Care – PPO | Admitting: Orthopaedic Surgery

## 2022-06-13 ENCOUNTER — Other Ambulatory Visit (HOSPITAL_BASED_OUTPATIENT_CLINIC_OR_DEPARTMENT_OTHER): Payer: Self-pay

## 2022-06-14 DIAGNOSIS — B351 Tinea unguium: Secondary | ICD-10-CM | POA: Diagnosis not present

## 2022-06-18 ENCOUNTER — Other Ambulatory Visit (HOSPITAL_BASED_OUTPATIENT_CLINIC_OR_DEPARTMENT_OTHER): Payer: Self-pay

## 2022-06-20 ENCOUNTER — Other Ambulatory Visit (HOSPITAL_BASED_OUTPATIENT_CLINIC_OR_DEPARTMENT_OTHER): Payer: Self-pay

## 2022-06-21 ENCOUNTER — Other Ambulatory Visit (HOSPITAL_BASED_OUTPATIENT_CLINIC_OR_DEPARTMENT_OTHER): Payer: Self-pay

## 2022-06-22 ENCOUNTER — Ambulatory Visit (HOSPITAL_BASED_OUTPATIENT_CLINIC_OR_DEPARTMENT_OTHER)
Admission: RE | Admit: 2022-06-22 | Discharge: 2022-06-22 | Disposition: A | Discharge status: Home / Self Care | Payer: BC Managed Care – PPO | Source: Ambulatory Visit | Attending: Orthopaedic Surgery | Admitting: Orthopaedic Surgery

## 2022-06-22 DIAGNOSIS — S83249A Other tear of medial meniscus, current injury, unspecified knee, initial encounter: Secondary | ICD-10-CM | POA: Diagnosis not present

## 2022-06-22 DIAGNOSIS — M25561 Pain in right knee: Secondary | ICD-10-CM | POA: Diagnosis not present

## 2022-06-25 ENCOUNTER — Other Ambulatory Visit (HOSPITAL_BASED_OUTPATIENT_CLINIC_OR_DEPARTMENT_OTHER): Payer: Self-pay

## 2022-06-27 ENCOUNTER — Other Ambulatory Visit (HOSPITAL_BASED_OUTPATIENT_CLINIC_OR_DEPARTMENT_OTHER): Payer: Self-pay

## 2022-07-01 ENCOUNTER — Other Ambulatory Visit (HOSPITAL_BASED_OUTPATIENT_CLINIC_OR_DEPARTMENT_OTHER): Payer: Self-pay

## 2022-07-02 ENCOUNTER — Other Ambulatory Visit (HOSPITAL_BASED_OUTPATIENT_CLINIC_OR_DEPARTMENT_OTHER): Payer: Self-pay

## 2022-07-03 ENCOUNTER — Other Ambulatory Visit (HOSPITAL_BASED_OUTPATIENT_CLINIC_OR_DEPARTMENT_OTHER): Payer: Self-pay

## 2022-07-03 DIAGNOSIS — S82142A Displaced bicondylar fracture of left tibia, initial encounter for closed fracture: Secondary | ICD-10-CM | POA: Diagnosis not present

## 2022-07-03 DIAGNOSIS — S42309A Unspecified fracture of shaft of humerus, unspecified arm, initial encounter for closed fracture: Secondary | ICD-10-CM | POA: Diagnosis not present

## 2022-07-03 DIAGNOSIS — M810 Age-related osteoporosis without current pathological fracture: Secondary | ICD-10-CM | POA: Diagnosis not present

## 2022-07-03 DIAGNOSIS — T07XXXA Unspecified multiple injuries, initial encounter: Secondary | ICD-10-CM | POA: Diagnosis not present

## 2022-07-04 ENCOUNTER — Other Ambulatory Visit (HOSPITAL_BASED_OUTPATIENT_CLINIC_OR_DEPARTMENT_OTHER): Payer: Self-pay

## 2022-07-04 DIAGNOSIS — M25561 Pain in right knee: Secondary | ICD-10-CM | POA: Diagnosis not present

## 2022-07-05 ENCOUNTER — Telehealth: Payer: Self-pay | Admitting: *Deleted

## 2022-07-05 ENCOUNTER — Other Ambulatory Visit (HOSPITAL_BASED_OUTPATIENT_CLINIC_OR_DEPARTMENT_OTHER): Payer: Self-pay

## 2022-07-05 NOTE — Telephone Encounter (Signed)
Prior auth started via cover my meds.  Awaiting determination.  Key: [id number]

## 2022-07-08 ENCOUNTER — Other Ambulatory Visit (HOSPITAL_BASED_OUTPATIENT_CLINIC_OR_DEPARTMENT_OTHER): Payer: Self-pay

## 2022-07-09 ENCOUNTER — Ambulatory Visit (INDEPENDENT_AMBULATORY_CARE_PROVIDER_SITE_OTHER): Payer: BC Managed Care – PPO

## 2022-07-09 VITALS — BP 149/86 | HR 70 | Temp 97.9°F | Resp 18 | Ht 67.5 in | Wt 180.0 lb

## 2022-07-09 DIAGNOSIS — M8000XG Age-related osteoporosis with current pathological fracture, unspecified site, subsequent encounter for fracture with delayed healing: Secondary | ICD-10-CM | POA: Diagnosis not present

## 2022-07-09 MED ORDER — ROMOSOZUMAB-AQQG 105 MG/1.17ML ~~LOC~~ SOSY
210.0000 mg | PREFILLED_SYRINGE | Freq: Once | SUBCUTANEOUS | Status: AC
  Administered 2022-07-09: 210 mg via SUBCUTANEOUS
  Filled 2022-07-09: qty 2.34

## 2022-07-09 NOTE — Progress Notes (Signed)
Diagnosis: Osteoporosis  Provider:  Praveen Mannam MD  Procedure: Injection  Evenity (Romosozumab-aqqg), Dose: 210 mg, Site: subcutaneous, Number of injections: 2  Post Care:     Discharge: Condition: Good, Destination: Home . AVS provided to patient.   Performed by:  Tanaka Gillen, RN       

## 2022-07-09 NOTE — Telephone Encounter (Signed)
Prior auth denied Not covered under pharmacy benefit and not covered by plan.

## 2022-07-11 ENCOUNTER — Other Ambulatory Visit (HOSPITAL_BASED_OUTPATIENT_CLINIC_OR_DEPARTMENT_OTHER): Payer: Self-pay

## 2022-08-02 DIAGNOSIS — M810 Age-related osteoporosis without current pathological fracture: Secondary | ICD-10-CM | POA: Diagnosis not present

## 2022-08-02 DIAGNOSIS — S42309A Unspecified fracture of shaft of humerus, unspecified arm, initial encounter for closed fracture: Secondary | ICD-10-CM | POA: Diagnosis not present

## 2022-08-02 DIAGNOSIS — S82142A Displaced bicondylar fracture of left tibia, initial encounter for closed fracture: Secondary | ICD-10-CM | POA: Diagnosis not present

## 2022-08-02 DIAGNOSIS — T07XXXA Unspecified multiple injuries, initial encounter: Secondary | ICD-10-CM | POA: Diagnosis not present

## 2022-08-05 ENCOUNTER — Other Ambulatory Visit (HOSPITAL_BASED_OUTPATIENT_CLINIC_OR_DEPARTMENT_OTHER): Payer: Self-pay

## 2022-08-05 MED FILL — Levothyroxine Sodium Tab 88 MCG: ORAL | 90 days supply | Qty: 90 | Fill #1 | Status: AC

## 2022-08-06 ENCOUNTER — Telehealth: Payer: Self-pay | Admitting: Family Medicine

## 2022-08-06 ENCOUNTER — Ambulatory Visit (INDEPENDENT_AMBULATORY_CARE_PROVIDER_SITE_OTHER): Payer: BC Managed Care – PPO | Admitting: *Deleted

## 2022-08-06 ENCOUNTER — Telehealth: Payer: BC Managed Care – PPO | Admitting: Family Medicine

## 2022-08-06 ENCOUNTER — Telehealth (INDEPENDENT_AMBULATORY_CARE_PROVIDER_SITE_OTHER): Payer: BC Managed Care – PPO | Admitting: Family

## 2022-08-06 ENCOUNTER — Ambulatory Visit: Payer: BC Managed Care – PPO

## 2022-08-06 ENCOUNTER — Other Ambulatory Visit (HOSPITAL_BASED_OUTPATIENT_CLINIC_OR_DEPARTMENT_OTHER): Payer: Self-pay

## 2022-08-06 ENCOUNTER — Other Ambulatory Visit (HOSPITAL_COMMUNITY): Payer: Self-pay

## 2022-08-06 DIAGNOSIS — B349 Viral infection, unspecified: Secondary | ICD-10-CM | POA: Diagnosis not present

## 2022-08-06 DIAGNOSIS — R062 Wheezing: Secondary | ICD-10-CM | POA: Diagnosis not present

## 2022-08-06 MED ORDER — ALBUTEROL SULFATE (2.5 MG/3ML) 0.083% IN NEBU
2.5000 mg | INHALATION_SOLUTION | Freq: Once | RESPIRATORY_TRACT | Status: AC
  Administered 2022-08-06: 2.5 mg via RESPIRATORY_TRACT

## 2022-08-06 MED ORDER — VENTOLIN HFA 108 (90 BASE) MCG/ACT IN AERS
2.0000 | INHALATION_SPRAY | RESPIRATORY_TRACT | 1 refills | Status: AC | PRN
  Filled 2022-08-06: qty 18, 25d supply, fill #0
  Filled 2022-08-06: qty 18, fill #0

## 2022-08-06 MED ORDER — PREDNISONE 20 MG PO TABS
ORAL_TABLET | ORAL | 0 refills | Status: DC
  Filled 2022-08-06: qty 9, 7d supply, fill #0

## 2022-08-06 MED ORDER — AZITHROMYCIN 250 MG PO TABS
ORAL_TABLET | ORAL | 0 refills | Status: DC
  Filled 2022-08-06: qty 6, 5d supply, fill #0

## 2022-08-06 MED ORDER — HYDROCODONE BIT-HOMATROP MBR 5-1.5 MG/5ML PO SOLN
5.0000 mL | Freq: Three times a day (TID) | ORAL | 0 refills | Status: DC | PRN
  Filled 2022-08-06: qty 120, 8d supply, fill #0

## 2022-08-06 NOTE — Telephone Encounter (Signed)
Lowne pt: Appointment is VV, should this be in person? It is at 2pm. Are you okay with the inhaler?

## 2022-08-06 NOTE — Telephone Encounter (Signed)
Patient thinks she has upper respiratory virus or something because she has chills, coughing, and wheezing. Appt has been scheduled with Ria Clock but she wants to know if an inhaler could be called in in the meantime. Patient was wheezing while we were on the phone.

## 2022-08-06 NOTE — Telephone Encounter (Signed)
Will need to address at OV today at 2 pm; if she doesn't feel like she can wait until 2 pm, to consider going on to [facility] now to be seen in person.

## 2022-08-06 NOTE — Telephone Encounter (Signed)
Pt was seen virtually 

## 2022-08-06 NOTE — Progress Notes (Signed)
Kathryn Boyer is a 66 y.o. female with the following history as recorded in Epic[employer]:  Patient Active Problem List   Diagnosis Date Noted   Overweight (BMI 25.0-29.9) 04/23/2022   Preventative health care 10/26/2021   Pressure injury of skin of sacral region 08/10/2021   Acute cystitis without hematuria 08/10/2021   Acute blood loss anemia    History of hypertension    Drug induced constipation    Postoperative pain    Critical polytrauma 07/27/2021   Humerus fracture 07/21/2021   Tibial plateau fracture, left 07/21/2021   Nondisplaced fracture of fifth metatarsal bone, right foot, initial encounter for closed fracture 07/21/2021   Nondisplaced fracture of lateral malleolus of right fibula, initial encounter for closed fracture 07/21/2021   Left trimalleolar fracture, closed, initial encounter 07/21/2021   Fall 07/20/2021   Urine frequency 04/29/2019   Chest pain 04/29/2019   Urinary frequency 04/29/2019   Post-menopausal 04/29/2019   Allergic rhinitis 10/13/2018   Mild intermittent asthma with acute exacerbation 10/13/2018   Gastroesophageal reflux disease 10/13/2018   Right hip pain 06/01/2018   Chronic low back pain 05/25/2018   Acute pain of right knee 05/25/2018   Thoracic aorta atherosclerosis (HCC) 05/25/2018   Generalized anxiety disorder 03/22/2017   Hyperlipidemia 03/22/2017   Hypothyroidism 03/22/2017   Osteoporosis 12/20/2016   Encounter for counseling 09/18/2016   Vaginal dryness 02/06/2016   Allergic rhinitis due to pollen 02/01/2016   Acute maxillary sinusitis 02/01/2016   Essential hypertension 02/01/2016   Asthma with acute exacerbation 02/01/2016   Lumbar radiculopathy, acute 11/01/2015   Osteoporosis, 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 30 tablet 0   azithromycin (ZITHROMAX Z-PAK) 250 MG tablet Take 2 tablets by mouth daily on day 1, then take 1 tablet daily for 4 days. 6 each 0   Camphor-Menthol-Methyl Sal (SALONPAS) 3.08-31-08 % PTCH Apply 5 patches topically daily.     cyclobenzaprine (FLEXERIL) 5 MG tablet Take 5 mg by mouth every 8 (eight) hours as needed for pain.     diclofenac Sodium (VOLTAREN) 1 % GEL Apply 2 g topically daily as needed (pain).     diphenhydrAMINE (BENADRYL) 25 MG tablet Take 25 mg by mouth every 6 (six) hours as needed for allergies.     estradiol (ESTRACE) 0.1 MG/GM vaginal cream Place 1 Applicatorful vaginally 3 (three) times a week. 42.5 g 3   famotidine-calcium carbonate-magnesium hydroxide (PEPCID COMPLETE) 10-800-165 MG chewable tablet Chew 1 tablet by mouth daily as needed (Heartburn).     HYDROcodone bit-homatropine (HYCODAN) 5-1.5 MG/5ML syrup Take 5 mLs by mouth every 8 (eight) hours as needed for cough. 120 mL 0   ibuprofen (ADVIL) 200 MG tablet Take 600 mg by mouth every 6 (six) hours as needed for mild pain or moderate pain.     levothyroxine (SYNTHROID) 88 MCG tablet TAKE 1 TABLET(88 MCG) BY MOUTH DAILY 90 tablet 1   Multiple Vitamins-Minerals (MULTIVITAMIN WITH MINERALS) tablet Take 1 tablet by mouth daily. V thrive     olmesartan (BENICAR) 40 MG tablet TAKE 1 TABLET(40 MG) BY MOUTH DAILY 90 tablet 1   ondansetron (ZOFRAN-ODT) 4 MG disintegrating tablet Take 1 tablet (4 mg total) by mouth every 8 (eight) hours as needed for nausea or vomiting. 20  tablet 0   OVER THE COUNTER MEDICATION Apply 1 application. topically daily as needed (Pain). Body Balm     oxyCODONE-acetaminophen (PERCOCET) 5-325 MG tablet Take 1-2 tablets by mouth every 8 (eight) hours as needed for severe pain. 40 tablet 0   pravastatin (PRAVACHOL) 20 MG tablet TAKE 1 TABLET(20 MG) BY MOUTH DAILY 90 tablet 1   predniSONE (DELTASONE) 20 MG tablet Take 2 tablets by mouth daily for 2  days, and then decrease to 1 tablet daily for 5 days. 9 tablet 0   Probiotic Product (PROBIOTIC PO) Take 1 capsule by mouth daily.     Romosozumab-aqqg (EVENITY) 105 MG/1. SOSY injection Inject 210 mg into the skin every 30 (thirty) days.     Semaglutide-Weight Management (WEGOVY) 0.25 MG/0.5ML SOAJ Inject 0.25 mg into the skin once a week. 2 mL 0   Vitamin D, Ergocalciferol, (DRISDOL) 1.25 MG (50000 UNIT) CAPS capsule TAKE 1 CAPSULE BY MOUTH EVERY 7 DAYS 12 capsule 1   VENTOLIN HFA 108 (90 Base) MCG/ACT inhaler Inhale 2 puffs into the lungs every 4 (four) hours as needed for wheezing or shortness of breath. 18 g 1   No current facility-administered medications for this visit.    Allergies: Contrast media [iodinated contrast media], Isovue [iopamidol], Amoxicillin, Belviq xr [lorcaserin hcl er], Saxenda [liraglutide -weight management], and Penicillins  Past Medical History:  Diagnosis Date   Anxiety    Asthma    GERD (gastroesophageal reflux disease)    Hyperlipidemia    Hypertension    Plantar fasciitis    Thyroid disease     Past Surgical History:  Procedure Laterality Date   ABDOMINAL HYSTERECTOMY     BREAST SURGERY     HARDWARE REMOVAL Left 01/08/2022   Procedure: HARDWARE REMOVAL;  Surgeon: Myrene Galas, MD;  Location: [facility] OR;  Service: Orthopedics;  Laterality: Left;   NOSE SURGERY     ORIF ANKLE FRACTURE Left 07/23/2021   Procedure: OPEN REDUCTION INTERNAL FIXATION (ORIF) ANKLE FRACTURE;  Surgeon: Myrene Galas, MD;  Location: [facility] OR;  Service: Orthopedics;  Laterality: Left;   REVERSE SHOULDER ARTHROPLASTY Left 07/25/2021   Procedure: LEFT REVERSE SHOULDER ARTHROPLASTY;  Surgeon: Bjorn Pippin, MD;  Location: [facility] OR;  Service: Orthopedics;  Laterality: Left;    Family History  Problem Relation Age of Onset   Heart disease Mother    Stroke Mother    Hypertension Mother    Cancer Mother        ovarian   Aneurysm Father    Hypertension Father    Heart disease Father     Kidney disease Father    Heart disease Sister     Social History   Tobacco Use   Smoking status: Never   Smokeless tobacco: Never  Substance Use Topics   Alcohol use: No    Subjective:    I connected with [name] on 08/06/22 at  2:00 PM EST by a video enabled telemedicine application and verified that I am speaking with the correct person using two identifiers.   I discussed the limitations of evaluation and management by telemedicine and the availability of in person appointments. The patient expressed understanding and agreed to proceed. Provider in office/ patient is at home; provider and patient are only 2 people on video call.   2-3 day history of wheezing/ "viral illness." Does have history of sickness induced asthma but does not have current active prescription for Flovent or albuterol; would be agreeable to coming to the office for  breathing treatment if possible; notes that she typically responds well to prednisone but is hesitant to take prednisone because she is on Evenity;   Objective:  There were no vitals filed for this visit.  General: Well developed, well nourished, in no acute distress  Skin : Warm and dry.  Head: Normocephalic and atraumatic  Lungs: Respirations unlabored; wheeze is noted while speaking with patient; Neurologic: Alert and oriented; speech intact; face symmetrical;   Assessment:  1. Viral illness   2. Wheezing     Plan:   Patient agrees to come on to the office now for a breathing treatment; she needs to be evaluated in person and will be scheduled for a nurse visit. Will refill Flovent and albuterol for her as well; will also start patient on Z-pak and prednisone 20 mg qd x 5 days; would like her to schedule in person follow up with her PCP by the end of the week.   These instructions were reviewed with patient while she was in the office; strict ER precautions were given to the patient.    Return in about 4 days (around 08/10/2022)  for See Dr. Laury Axon in person for re-check on Friday, re-check.  No orders of the defined types were placed in this encounter.   Requested Prescriptions   Signed Prescriptions Disp Refills   azithromycin (ZITHROMAX Z-PAK) 250 MG tablet 6 each 0    Sig: Take 2 tablets by mouth daily on day 1, then take 1 tablet daily for 4 days.   predniSONE (DELTASONE) 20 MG tablet 9 tablet 0    Sig: Take 2 tablets by mouth daily for 2 days, and then decrease to 1 tablet daily for 5 days.   VENTOLIN HFA 108 (90 Base) MCG/ACT inhaler 18 g 1    Sig: Inhale 2 puffs into the lungs every 4 (four) hours as needed for wheezing or shortness of breath.   HYDROcodone bit-homatropine (HYCODAN) 5-1.5 MG/5ML syrup 120 mL 0    Sig: Take 5 mLs by mouth every 8 (eight) hours as needed for cough.

## 2022-08-06 NOTE — Telephone Encounter (Signed)
[facility] on [address] [place]

## 2022-08-06 NOTE — Progress Notes (Signed)
Patient here for albuterol nebulizer treatment per Ria Clock, FNP.   Nebulizer treatment given and patient tolerated well.

## 2022-08-07 ENCOUNTER — Other Ambulatory Visit (HOSPITAL_BASED_OUTPATIENT_CLINIC_OR_DEPARTMENT_OTHER): Payer: Self-pay

## 2022-08-08 ENCOUNTER — Other Ambulatory Visit: Payer: Self-pay | Admitting: Family Medicine

## 2022-08-08 ENCOUNTER — Telehealth: Payer: Self-pay | Admitting: *Deleted

## 2022-08-08 ENCOUNTER — Other Ambulatory Visit (HOSPITAL_BASED_OUTPATIENT_CLINIC_OR_DEPARTMENT_OTHER): Payer: Self-pay

## 2022-08-08 DIAGNOSIS — R051 Acute cough: Secondary | ICD-10-CM

## 2022-08-08 MED ORDER — HYDROCOD POLI-CHLORPHE POLI ER 10-8 MG/5ML PO SUER: 5.0000 mL | Freq: Every evening | ORAL | 0 refills | Status: DC | PRN

## 2022-08-08 NOTE — Telephone Encounter (Signed)
Spoke with patient and advised her of note.  She stated that she had just took 1 prednisone.  She asked about the dry throat.  Advised her not to take cough syrup that was previously prescribe.  She stated that she has also been taking Delsym.  Also advised to take as directed for cough.  She seem a little rude after I advised of what to do.  She has follow up tomorrow.

## 2022-08-08 NOTE — Telephone Encounter (Signed)
Patient called and stated that yesterday she took prednisone and cleaned her house.  Later that night her watch was going off because her heart rate was going crazy, she then realized that she did not take her blood pressure medication.  She then took her cough syrup and inhaler and her throat started to feel dry and then felt like it was closing.  She did not want to go to hospital so she use cough drops and tea with honey.  She stated that the cough syrup did not work.  She usually takes tussionex which worked well in the past.  She would like to know if she needs to keep taking the prednisone, using her inhaler less than every 4 hours?  From seeing her in the office when I did her neb treatment and hearing her on the phone she sounded much better.  She was not pleased with her visit because she felt like the provider was to busy to take care of her.  Advised that in this situation we like to see you in person to listen to your lungs.  She stated that she did not wanted to come in and spread anything.  Also when she came in for the neb treatment she stated that she has been bed written for months and did not want to come out of the house.

## 2022-08-08 NOTE — Telephone Encounter (Signed)
What about the cough syrup did you want to change to Tussionex?

## 2022-08-09 ENCOUNTER — Other Ambulatory Visit (HOSPITAL_BASED_OUTPATIENT_CLINIC_OR_DEPARTMENT_OTHER): Payer: Self-pay

## 2022-08-09 ENCOUNTER — Ambulatory Visit (INDEPENDENT_AMBULATORY_CARE_PROVIDER_SITE_OTHER): Payer: BC Managed Care – PPO | Admitting: Family Medicine

## 2022-08-09 ENCOUNTER — Ambulatory Visit (HOSPITAL_BASED_OUTPATIENT_CLINIC_OR_DEPARTMENT_OTHER)
Admission: RE | Admit: 2022-08-09 | Discharge: 2022-08-09 | Disposition: A | Discharge status: Home / Self Care | Payer: BC Managed Care – PPO | Source: Ambulatory Visit | Attending: Family Medicine | Admitting: Family Medicine

## 2022-08-09 ENCOUNTER — Encounter: Payer: Self-pay | Admitting: Family Medicine

## 2022-08-09 VITALS — BP 126/86 | HR 61 | Temp 98.4°F | Resp 18 | Ht 67.5 in | Wt 175.2 lb

## 2022-08-09 DIAGNOSIS — R059 Cough, unspecified: Secondary | ICD-10-CM | POA: Diagnosis not present

## 2022-08-09 DIAGNOSIS — J40 Bronchitis, not specified as acute or chronic: Secondary | ICD-10-CM

## 2022-08-09 DIAGNOSIS — J0140 Acute pansinusitis, unspecified: Secondary | ICD-10-CM | POA: Insufficient documentation

## 2022-08-09 HISTORY — DX: Acute pansinusitis, unspecified: J01.40

## 2022-08-09 HISTORY — DX: Bronchitis, not specified as acute or chronic: J40

## 2022-08-09 MED ORDER — LEVOFLOXACIN 500 MG PO TABS
500.0000 mg | ORAL_TABLET | Freq: Every day | ORAL | 0 refills | Status: AC
  Filled 2022-08-09: qty 10, 10d supply, fill #0

## 2022-08-09 NOTE — Progress Notes (Signed)
Established Patient Office Visit  Subjective   Patient ID: Kathryn Boyer, female    DOB: 1956/03/31  Age: 66 y.o. MRN: [id number]  Chief Complaint  Patient presents with   Wheezing    HPI Pt is here c/o cont cough and wheezing with nasal congestion.  She did a virtual visit and was given z pak and prednisone.  She is slightly better but is still coughing a lot   no fevers  Patient Active Problem List   Diagnosis Date Noted   Bronchitis 08/09/2022   Acute non-recurrent pansinusitis 08/09/2022   Overweight (BMI 25.0-29.9) 04/23/2022   Preventative health care 10/26/2021   Pressure injury of skin of sacral region 08/10/2021   Acute cystitis without hematuria 08/10/2021   Acute blood loss anemia    History of hypertension    Drug induced constipation    Postoperative pain    Critical polytrauma 07/27/2021   Humerus fracture 07/21/2021   Tibial plateau fracture, left 07/21/2021   Nondisplaced fracture of fifth metatarsal bone, right foot, initial encounter for closed fracture 07/21/2021   Nondisplaced fracture of lateral malleolus of right fibula, initial encounter for closed fracture 07/21/2021   Left trimalleolar fracture, closed, initial encounter 07/21/2021   Fall 07/20/2021   Urine frequency 04/29/2019   Chest pain 04/29/2019   Urinary frequency 04/29/2019   Post-menopausal 04/29/2019   Allergic rhinitis 10/13/2018   Mild intermittent asthma with acute exacerbation 10/13/2018   Gastroesophageal reflux disease 10/13/2018   Right hip pain 06/01/2018   Chronic low back pain 05/25/2018   Acute pain of right knee 05/25/2018   Thoracic aorta atherosclerosis (HCC) 05/25/2018   Generalized anxiety disorder 03/22/2017   Hyperlipidemia 03/22/2017   Hypothyroidism 03/22/2017   Osteoporosis 12/20/2016   Encounter for counseling 09/18/2016   Vaginal dryness 02/06/2016   Allergic rhinitis due to pollen 02/01/2016   Acute maxillary sinusitis 02/01/2016   Essential hypertension  02/01/2016   Asthma with acute exacerbation 02/01/2016   Lumbar radiculopathy, acute 11/01/2015   Osteoporosis, 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   Adjustment disorder with anxiety 02/08/2015   Abnormal weight gain 02/08/2015   Past Medical History:  Diagnosis Date   Anxiety    Asthma    GERD (gastroesophageal reflux disease)    Hyperlipidemia    Hypertension    Plantar fasciitis    Thyroid disease    Past Surgical History:  Procedure Laterality Date   ABDOMINAL HYSTERECTOMY     BREAST SURGERY     HARDWARE REMOVAL Left 01/08/2022   Procedure: HARDWARE REMOVAL;  Surgeon: Myrene Galas, MD;  Location: [facility] OR;  Service: Orthopedics;  Laterality: Left;   NOSE SURGERY     ORIF ANKLE FRACTURE Left 07/23/2021   Procedure: OPEN REDUCTION INTERNAL FIXATION (ORIF) ANKLE FRACTURE;  Surgeon: Myrene Galas, MD;  Location: [facility] OR;  Service: Orthopedics;  Laterality: Left;   REVERSE SHOULDER ARTHROPLASTY Left 07/25/2021   Procedure: LEFT REVERSE SHOULDER ARTHROPLASTY;  Surgeon: Bjorn Pippin, MD;  Location: [facility] OR;  Service: Orthopedics;  Laterality: Left;   Social History   Tobacco Use   Smoking status: Never   Smokeless tobacco: Never  Vaping Use   Vaping Use: Never used  Substance Use Topics   Alcohol use: No   Drug use: No   Social History   Socioeconomic History   Marital status: Married    Spouse name: Not on file   Number of children: Not on file   Years of education: Not on file   Highest education level: Not  on file  Occupational History   Not on file  Tobacco Use   Smoking status: Never   Smokeless tobacco: Never  Vaping Use   Vaping Use: Never used  Substance and Sexual Activity   Alcohol use: No   Drug use: No   Sexual activity: Not on file  Other Topics Concern   Not on file  Social History Narrative   Exercise-sometimes   Social Determinants of Health   Financial Resource Strain: Not on file  Food Insecurity: Not on file  Transportation Needs: Not on file   Physical Activity: Not on file  Stress: Not on file  Social Connections: Not on file  Intimate Partner Violence: Not on file   Family Status  Relation Name Status   Mother  Deceased   Father  Deceased   Sister  (Not Specified)   Family History  Problem Relation Age of Onset   Heart disease Mother    Stroke Mother    Hypertension Mother    Cancer Mother        ovarian   Aneurysm Father    Hypertension Father    Heart disease Father    Kidney disease Father    Heart disease Sister    Allergies  Allergen Reactions   Contrast Media [Iodinated Contrast Media] Hives   Isovue [Iopamidol] Hives    After returning home pt called to inform technologist of rash to face and neck, denies difficulty breathing or swallowing, pt took 2 benedryl Will need premeds in future   Amoxicillin Other (See Comments)    Headache  Cephalosporin tolerant 07/23/2021   Belviq Xr [Lorcaserin Hcl Er] Itching    Caused her face to turn red.   Saxenda [Liraglutide -Weight Management] Other (See Comments)    Caused indigestion.   Penicillins Rash    Cephalosporin Tolerant 07/23/2021       Review of Systems  Constitutional:  Positive for fever. Negative for malaise/fatigue.  HENT:  Positive for congestion, sinus pain and sore throat.   Eyes:  Negative for blurred vision.  Respiratory:  Positive for cough, sputum production, shortness of breath and wheezing.   Cardiovascular:  Negative for chest pain, palpitations and leg swelling.  Gastrointestinal:  Negative for vomiting.  Musculoskeletal:  Negative for back pain.  Skin:  Negative for rash.  Neurological:  Negative for loss of consciousness and headaches.      Objective:     BP 126/86 (BP Location: Right Arm, Patient Position: Sitting, Cuff Size: Normal)   Pulse 61   Temp 98.4 F (36.9 C) (Oral)   Resp 18   Ht 5' 7.5" (1.715 m)   Wt 175 lb 3.2 oz (79.5 kg)   SpO2 98%   BMI 27.04 kg/m  BP Readings from Last 3 Encounters:  08/09/22  126/86  07/09/22 (!) 149/86  06/11/22 127/84   Wt Readings from Last 3 Encounters:  08/09/22 175 lb 3.2 oz (79.5 kg)  07/09/22 180 lb (81.6 kg)  06/11/22 181 lb 6.4 oz (82.3 kg)   SpO2 Readings from Last 3 Encounters:  08/09/22 98%  07/09/22 97%  06/11/22 98%      Physical Exam Vitals and nursing note reviewed.  Constitutional:      Appearance: She is well-developed.  HENT:     Head: Normocephalic and atraumatic.     Nose: Congestion present.  Eyes:     Conjunctiva/sclera: Conjunctivae normal.  Neck:     Thyroid: No thyromegaly.     Vascular: No carotid  bruit or JVD.  Cardiovascular:     Rate and Rhythm: Normal rate and regular rhythm.     Heart sounds: Normal heart sounds. No murmur heard. Pulmonary:     Effort: Pulmonary effort is normal. No respiratory distress.     Breath sounds: Wheezing present. No rales.  Chest:     Chest wall: No tenderness.  Musculoskeletal:     Cervical back: Normal range of motion and neck supple.  Neurological:     General: No focal deficit present.     Mental Status: She is alert and oriented to person, place, and time.  Psychiatric:        Mood and Affect: Mood normal.        Behavior: Behavior normal.        Thought Content: Thought content normal.        Judgment: Judgment normal.      No results found for any visits on 08/09/22.  Last CBC Lab Results  Component Value Date   WBC 6.8 01/08/2022   HGB 14.8 01/08/2022   HCT 44.1 01/08/2022   MCV 92.1 01/08/2022   MCH 30.9 01/08/2022   RDW 13.4 01/08/2022   PLT 248 01/08/2022   Last metabolic panel Lab Results  Component Value Date   GLUCOSE 94 04/25/2022   NA 140 04/25/2022   K 4.5 04/25/2022   CL 105 04/25/2022   CO2 26 04/25/2022   BUN 15 04/25/2022   CREATININE 0.89 04/25/2022   GFRNONAA >60 01/08/2022   CALCIUM 9.3 04/25/2022   PROT 7.2 04/25/2022   ALBUMIN 4.5 04/25/2022   BILITOT 0.8 04/25/2022   ALKPHOS 98 04/25/2022   AST 19 04/25/2022   ALT 16  04/25/2022   ANIONGAP 8 01/08/2022   Last lipids Lab Results  Component Value Date   CHOL 195 04/25/2022   HDL 56.80 04/25/2022   LDLCALC 117 (H) 04/25/2022   TRIG 105.0 04/25/2022   CHOLHDL 3 04/25/2022   Last hemoglobin A1c Lab Results  Component Value Date   HGBA1C 5.9 04/25/2022   Last thyroid functions Lab Results  Component Value Date   TSH 2.72 04/25/2022   T4TOTAL 8.8 04/25/2022   Last vitamin D Lab Results  Component Value Date   VD25OH 45.95 04/25/2022   Last vitamin B12 and Folate Lab Results  Component Value Date   VITAMINB12 393 04/25/2022      The 10-year ASCVD risk score (Arnett DK, et al., 2019) is: 8.7%    Assessment & Plan:   Problem List Items Addressed This Visit       Unprioritized   Bronchitis - Primary    Levaquin x 10 days ? Pneumonia  Cough syrup was sent in yesterday Check cxr F/u as needed       Relevant Medications   levofloxacin (LEVAQUIN) 500 MG tablet   Other Relevant Orders   DG Chest 2 View (Completed)   Acute non-recurrent pansinusitis    Abx per orders  Con't saline spray       Relevant Medications   levofloxacin (LEVAQUIN) 500 MG tablet    Return if symptoms worsen or fail to improve.    Donato Schultz, DO

## 2022-08-09 NOTE — Assessment & Plan Note (Signed)
Levaquin x 10 days ? Pneumonia  Cough syrup was sent in yesterday Check cxr F/u as needed

## 2022-08-09 NOTE — Assessment & Plan Note (Signed)
Abx per orders  Con't saline spray

## 2022-08-09 NOTE — Patient Instructions (Signed)

## 2022-08-13 ENCOUNTER — Ambulatory Visit (INDEPENDENT_AMBULATORY_CARE_PROVIDER_SITE_OTHER): Payer: BC Managed Care – PPO

## 2022-08-13 VITALS — BP 138/83 | HR 69 | Temp 97.9°F | Resp 20 | Ht 67.75 in | Wt 174.4 lb

## 2022-08-13 DIAGNOSIS — M8000XG Age-related osteoporosis with current pathological fracture, unspecified site, subsequent encounter for fracture with delayed healing: Secondary | ICD-10-CM | POA: Diagnosis not present

## 2022-08-13 MED ORDER — ROMOSOZUMAB-AQQG 105 MG/1.17ML ~~LOC~~ SOSY
210.0000 mg | PREFILLED_SYRINGE | Freq: Once | SUBCUTANEOUS | Status: AC
  Administered 2022-08-13: 210 mg via SUBCUTANEOUS
  Filled 2022-08-13: qty 2.34

## 2022-08-13 NOTE — Progress Notes (Signed)
Diagnosis: Osteoporosis  Provider:  Praveen Mannam MD  Procedure: Injection  Evenity (Romosozumab-aqqg), Dose: 210 mg, Site: subcutaneous, Number of injections: 2  Post Care:     Discharge: Condition: Good, Destination: Home . AVS provided to patient.   Performed by:  Drae Mitzel, RN       

## 2022-08-28 DIAGNOSIS — J0121 Acute recurrent ethmoidal sinusitis: Secondary | ICD-10-CM | POA: Diagnosis not present

## 2022-08-28 DIAGNOSIS — R0982 Postnasal drip: Secondary | ICD-10-CM

## 2022-08-28 HISTORY — DX: Postnasal drip: R09.82

## 2022-09-02 DIAGNOSIS — T07XXXA Unspecified multiple injuries, initial encounter: Secondary | ICD-10-CM | POA: Diagnosis not present

## 2022-09-02 DIAGNOSIS — S42309A Unspecified fracture of shaft of humerus, unspecified arm, initial encounter for closed fracture: Secondary | ICD-10-CM | POA: Diagnosis not present

## 2022-09-02 DIAGNOSIS — S82142A Displaced bicondylar fracture of left tibia, initial encounter for closed fracture: Secondary | ICD-10-CM | POA: Diagnosis not present

## 2022-09-02 DIAGNOSIS — M810 Age-related osteoporosis without current pathological fracture: Secondary | ICD-10-CM | POA: Diagnosis not present

## 2022-09-03 ENCOUNTER — Ambulatory Visit: Payer: BC Managed Care – PPO

## 2022-09-05 DIAGNOSIS — J0121 Acute recurrent ethmoidal sinusitis: Secondary | ICD-10-CM | POA: Diagnosis not present

## 2022-09-05 DIAGNOSIS — R519 Headache, unspecified: Secondary | ICD-10-CM | POA: Diagnosis not present

## 2022-09-10 ENCOUNTER — Ambulatory Visit (INDEPENDENT_AMBULATORY_CARE_PROVIDER_SITE_OTHER): Payer: Medicare Other

## 2022-09-10 VITALS — BP 143/86 | HR 69 | Temp 97.8°F | Resp 20 | Ht 67.0 in | Wt 181.6 lb

## 2022-09-10 DIAGNOSIS — M8000XG Age-related osteoporosis with current pathological fracture, unspecified site, subsequent encounter for fracture with delayed healing: Secondary | ICD-10-CM

## 2022-09-10 MED ORDER — ROMOSOZUMAB-AQQG 105 MG/1.17ML ~~LOC~~ SOSY
210.0000 mg | PREFILLED_SYRINGE | Freq: Once | SUBCUTANEOUS | Status: AC
  Administered 2022-09-10: 210 mg via SUBCUTANEOUS
  Filled 2022-09-10: qty 2.34

## 2022-09-10 NOTE — Progress Notes (Signed)
Diagnosis: Osteoporosis  Provider:  Marshell Garfinkel MD  Procedure: Injection  Evenity (Romosozumab-aqqg), Dose: 210 mg, Site: subcutaneous, Number of injections: 2  Post Care:     Discharge: Condition: Good, Destination: Home . AVS provided to patient.   Performed by:  Arnoldo Morale, RN

## 2022-10-03 DIAGNOSIS — T07XXXA Unspecified multiple injuries, initial encounter: Secondary | ICD-10-CM | POA: Diagnosis not present

## 2022-10-03 DIAGNOSIS — S42309A Unspecified fracture of shaft of humerus, unspecified arm, initial encounter for closed fracture: Secondary | ICD-10-CM | POA: Diagnosis not present

## 2022-10-03 DIAGNOSIS — S82142A Displaced bicondylar fracture of left tibia, initial encounter for closed fracture: Secondary | ICD-10-CM | POA: Diagnosis not present

## 2022-10-03 DIAGNOSIS — M810 Age-related osteoporosis without current pathological fracture: Secondary | ICD-10-CM | POA: Diagnosis not present

## 2022-10-08 ENCOUNTER — Ambulatory Visit: Payer: BC Managed Care – PPO

## 2022-10-09 ENCOUNTER — Ambulatory Visit (INDEPENDENT_AMBULATORY_CARE_PROVIDER_SITE_OTHER): Payer: Medicare Other

## 2022-10-09 ENCOUNTER — Other Ambulatory Visit: Payer: Self-pay | Admitting: Pharmacy Technician

## 2022-10-09 ENCOUNTER — Encounter: Payer: Self-pay | Admitting: Pulmonary Disease

## 2022-10-09 ENCOUNTER — Ambulatory Visit: Payer: BC Managed Care – PPO

## 2022-10-09 VITALS — BP 138/81 | HR 70 | Temp 97.8°F | Resp 18 | Ht 67.0 in | Wt 182.2 lb

## 2022-10-09 DIAGNOSIS — M8000XG Age-related osteoporosis with current pathological fracture, unspecified site, subsequent encounter for fracture with delayed healing: Secondary | ICD-10-CM

## 2022-10-09 MED ORDER — ROMOSOZUMAB-AQQG 105 MG/1.17ML ~~LOC~~ SOSY
210.0000 mg | PREFILLED_SYRINGE | Freq: Once | SUBCUTANEOUS | Status: AC
  Administered 2022-10-09: 210 mg via SUBCUTANEOUS
  Filled 2022-10-09: qty 2.34

## 2022-10-09 NOTE — Progress Notes (Signed)
Diagnosis: Osteoporosis  Provider:  Marshell Garfinkel MD  Procedure: Injection  Evenity (Romosozumab-aqqg), Dose: 200 mg, Site: subcutaneous, Number of injections: 2  Post Care:  n/a  Discharge: Condition: Good, Destination: Home . AVS Provided and AVS Declined  Performed by:  Cleophus Molt, RN

## 2022-10-29 ENCOUNTER — Ambulatory Visit: Payer: BC Managed Care – PPO | Admitting: Family Medicine

## 2022-11-01 ENCOUNTER — Encounter: Payer: Self-pay | Admitting: Family Medicine

## 2022-11-01 ENCOUNTER — Encounter: Payer: Self-pay | Admitting: Pulmonary Disease

## 2022-11-01 ENCOUNTER — Ambulatory Visit (INDEPENDENT_AMBULATORY_CARE_PROVIDER_SITE_OTHER): Payer: Medicare Other | Admitting: Family Medicine

## 2022-11-01 VITALS — BP 120/90 | HR 74 | Temp 98.7°F | Resp 18 | Ht 67.0 in | Wt 179.0 lb

## 2022-11-01 DIAGNOSIS — E039 Hypothyroidism, unspecified: Secondary | ICD-10-CM | POA: Diagnosis not present

## 2022-11-01 DIAGNOSIS — F419 Anxiety disorder, unspecified: Secondary | ICD-10-CM

## 2022-11-01 DIAGNOSIS — I10 Essential (primary) hypertension: Secondary | ICD-10-CM

## 2022-11-01 DIAGNOSIS — E785 Hyperlipidemia, unspecified: Secondary | ICD-10-CM

## 2022-11-01 DIAGNOSIS — M8000XG Age-related osteoporosis with current pathological fracture, unspecified site, subsequent encounter for fracture with delayed healing: Secondary | ICD-10-CM | POA: Diagnosis not present

## 2022-11-01 DIAGNOSIS — E559 Vitamin D deficiency, unspecified: Secondary | ICD-10-CM

## 2022-11-01 LAB — COMPREHENSIVE METABOLIC PANEL
ALT: 24 U/L (ref 0–35)
AST: 28 U/L (ref 0–37)
Albumin: 4.5 g/dL (ref 3.5–5.2)
Alkaline Phosphatase: 100 U/L (ref 39–117)
BUN: 12 mg/dL (ref 6–23)
CO2: 26 mEq/L (ref 19–32)
Calcium: 9.9 mg/dL (ref 8.4–10.5)
Chloride: 105 mEq/L (ref 96–112)
Creatinine, Ser: 0.97 mg/dL (ref 0.40–1.20)
GFR: 60.45 mL/min (ref >60.00)
Glucose, Bld: 86 mg/dL (ref 70–99)
Potassium: 4.6 mEq/L (ref 3.5–5.1)
Sodium: 140 mEq/L (ref 135–145)
Total Bilirubin: 0.8 mg/dL (ref 0.2–1.2)
Total Protein: 7.3 g/dL (ref 6.0–8.3)

## 2022-11-01 LAB — CBC WITH DIFFERENTIAL/PLATELET
Basophils Absolute: 0.1 10*3/uL (ref 0.0–0.1)
Basophils Relative: 0.9 % (ref 0.0–3.0)
Eosinophils Absolute: 0.1 10*3/uL (ref 0.0–0.7)
Eosinophils Relative: 1.7 % (ref 0.0–5.0)
HCT: 44.5 % (ref 36.0–46.0)
Hemoglobin: 15 g/dL (ref 12.0–15.0)
Lymphocytes Relative: 38.4 % (ref 12.0–46.0)
Lymphs Abs: 2.5 10*3/uL (ref 0.7–4.0)
MCHC: 33.7 g/dL (ref 30.0–36.0)
MCV: 91.9 fl (ref 78.0–100.0)
Monocytes Absolute: 0.5 10*3/uL (ref 0.1–1.0)
Monocytes Relative: 7.2 % (ref 3.0–12.0)
Neutro Abs: 3.4 10*3/uL (ref 1.4–7.7)
Neutrophils Relative %: 51.8 % (ref 43.0–77.0)
Platelets: 268 10*3/uL (ref 150.0–400.0)
RBC: 4.84 Mil/uL (ref 3.87–5.11)
RDW: 13.6 % (ref 11.5–15.5)
WBC: 6.6 10*3/uL (ref 4.0–10.5)

## 2022-11-01 LAB — LIPID PANEL
Cholesterol: 187 mg/dL (ref 0–200)
HDL: 55.7 mg/dL (ref >39.00)
LDL Cholesterol: 111 mg/dL — ABNORMAL HIGH (ref 0–99)
NonHDL: 131.09
Total CHOL/HDL Ratio: 3
Triglycerides: 98 mg/dL (ref 0.0–149.0)
VLDL: 19.6 mg/dL (ref 0.0–40.0)

## 2022-11-01 LAB — TSH: TSH: 2.06 u[IU]/mL (ref 0.35–5.50)

## 2022-11-01 LAB — VITAMIN D 25 HYDROXY (VIT D DEFICIENCY, FRACTURES): VITD: 32.22 ng/mL (ref 30.00–100.00)

## 2022-11-01 MED ORDER — LEVOTHYROXINE SODIUM 88 MCG PO TABS: ORAL_TABLET | ORAL | 1 refills | Status: DC

## 2022-11-01 MED ORDER — PRAVASTATIN SODIUM 20 MG PO TABS: ORAL_TABLET | ORAL | 1 refills | Status: DC

## 2022-11-01 MED ORDER — VITAMIN D (ERGOCALCIFEROL) 1.25 MG (50000 UNIT) PO CAPS: 50000.0000 [IU] | ORAL_CAPSULE | ORAL | 1 refills | Status: DC

## 2022-11-01 MED ORDER — OLMESARTAN MEDOXOMIL 40 MG PO TABS: 40.0000 mg | ORAL_TABLET | Freq: Every day | ORAL | 3 refills | Status: DC

## 2022-11-01 NOTE — Progress Notes (Signed)
Subjective:   By signing my name below, I, Marlana Latus, attest that this documentation has been prepared under the direction and in the presence of Ann Held, DO 11/01/22   Patient ID: Kathryn Boyer, female    DOB: 1956-02-21, 67 y.o.   MRN: [id number]  Chief Complaint  Patient presents with   Hypertension   Hyperlipidemia   Hypothyroidism   Follow-up    HPI Patient is in today for a 6 month follow up.  She has been exercising at the gym regularly and meets with a trainer 4 times a week. She has not lost any weight yet. She is not following a specific diet but would like to try intermittent fasting.   She reports that she was unable to get [facility] since it was out. She states that her insurance would not cover it, but she may be willing to self pay.   She notes that her diastolic blood pressure tends to be elevated lately.  She occasionally has trouble sleeping and will take half a xanax when this occurs. She avoids relying on her xanax and does not tend to take it often.   Past Medical History:  Diagnosis Date   Anxiety    Asthma    GERD (gastroesophageal reflux disease)    Hyperlipidemia    Hypertension    Plantar fasciitis    Thyroid disease     Past Surgical History:  Procedure Laterality Date   ABDOMINAL HYSTERECTOMY     BREAST SURGERY     HARDWARE REMOVAL Left 01/08/2022   Procedure: HARDWARE REMOVAL;  Surgeon: Altamese Bode, MD;  Location: [place];  Service: Orthopedics;  Laterality: Left;   NOSE SURGERY     ORIF ANKLE FRACTURE Left 07/23/2021   Procedure: OPEN REDUCTION INTERNAL FIXATION (ORIF) ANKLE FRACTURE;  Surgeon: Altamese Rockwood, MD;  Location: [place];  Service: Orthopedics;  Laterality: Left;   REVERSE SHOULDER ARTHROPLASTY Left 07/25/2021   Procedure: LEFT REVERSE SHOULDER ARTHROPLASTY;  Surgeon: Hiram Gash, MD;  Location: [place];  Service: Orthopedics;  Laterality: Left;    Family History  Problem Relation Age of Onset   Heart disease  Mother    Stroke Mother    Hypertension Mother    Cancer Mother        ovarian   Aneurysm Father    Hypertension Father    Heart disease Father    Kidney disease Father    Heart disease Sister     Social History   Socioeconomic History   Marital status: Married    Spouse name: Not on file   Number of children: Not on file   Years of education: Not on file   Highest education level: Not on file  Occupational History   Not on file  Tobacco Use   Smoking status: Never   Smokeless tobacco: Never  Vaping Use   Vaping Use: Never used  Substance and Sexual Activity   Alcohol use: No   Drug use: No   Sexual activity: Not on file  Other Topics Concern   Not on file  Social History Narrative   Exercise-sometimes   Social Determinants of Health   Financial Resource Strain: Not on file  Food Insecurity: Not on file  Transportation Needs: Not on file  Physical Activity: Not on file  Stress: Not on file  Social Connections: Not on file  Intimate Partner Violence: Not on file    Outpatient Medications Prior to Visit  Medication Sig Dispense Refill  ALPRAZolam (XANAX) 0.5 MG tablet Take 1 tablet (0.5 mg total) by mouth 3 (three) times daily as needed for anxiety. 30 tablet 0   Camphor-Menthol-Methyl Sal (SALONPAS) 3.08-31-08 % PTCH Apply 5 patches topically daily.     chlorpheniramine-HYDROcodone (TUSSIONEX) 10-8 MG/5ML Take 5 mLs by mouth at bedtime as needed for cough. 8 mL 0   cyclobenzaprine (FLEXERIL) 5 MG tablet Take 5 mg by mouth every 8 (eight) hours as needed for pain.     diclofenac Sodium (VOLTAREN) 1 % GEL Apply 2 g topically daily as needed (pain).     diphenhydrAMINE (BENADRYL) 25 MG tablet Take 25 mg by mouth every 6 (six) hours as needed for allergies.     estradiol (ESTRACE) 0.1 MG/GM vaginal cream Place 1 Applicatorful vaginally 3 (three) times a week. 42.5 g 3   famotidine-calcium carbonate-magnesium hydroxide (PEPCID COMPLETE) 10-800-165 MG chewable tablet  Chew 1 tablet by mouth daily as needed (Heartburn).     ibuprofen (ADVIL) 200 MG tablet Take 600 mg by mouth every 6 (six) hours as needed for mild pain or moderate pain.     Multiple Vitamins-Minerals (MULTIVITAMIN WITH MINERALS) tablet Take 1 tablet by mouth daily. V thrive     ondansetron (ZOFRAN-ODT) 4 MG disintegrating tablet Take 1 tablet (4 mg total) by mouth every 8 (eight) hours as needed for nausea or vomiting. 20 tablet 0   OVER THE COUNTER MEDICATION Apply 1 application. topically daily as needed (Pain). Body Balm     oxyCODONE-acetaminophen (PERCOCET) 5-325 MG tablet Take 1-2 tablets by mouth every 8 (eight) hours as needed for severe pain. 40 tablet 0   Probiotic Product (PROBIOTIC PO) Take 1 capsule by mouth daily.     Romosozumab-aqqg (EVENITY) 105 MG/1.17ML SOSY injection Inject 210 mg into the skin every 30 (thirty) days.     VENTOLIN HFA 108 (90 Base) MCG/ACT inhaler Inhale 2 puffs into the lungs every 4 (four) hours as needed for wheezing or shortness of breath. 18 g 1   azithromycin (ZITHROMAX Z-PAK) 250 MG tablet Take 2 tablets by mouth daily on day 1, then take 1 tablet daily for 4 days. 6 each 0   HYDROcodone bit-homatropine (HYCODAN) 5-1.5 MG/5ML syrup Take 5 mLs by mouth every 8 (eight) hours as needed for cough. 120 mL 0   levothyroxine (SYNTHROID) 88 MCG tablet TAKE 1 TABLET(88 MCG) BY MOUTH DAILY 90 tablet 1   olmesartan (BENICAR) 40 MG tablet TAKE 1 TABLET(40 MG) BY MOUTH DAILY 90 tablet 1   pravastatin (PRAVACHOL) 20 MG tablet TAKE 1 TABLET(20 MG) BY MOUTH DAILY 90 tablet 1   predniSONE (DELTASONE) 20 MG tablet Take 2 tablets by mouth daily for 2 days, and then decrease to 1 tablet daily for 5 days. 9 tablet 0   Semaglutide-Weight Management (WEGOVY) 0.25 MG/0.5ML SOAJ Inject 0.25 mg into the skin once a week. 2 mL 0   Vitamin D, Ergocalciferol, (DRISDOL) 1.25 MG (50000 UNIT) CAPS capsule TAKE 1 CAPSULE BY MOUTH EVERY 7 DAYS 12 capsule 1   No facility-administered  medications prior to visit.    Allergies  Allergen Reactions   Contrast Media [Iodinated Contrast Media] Hives   Isovue [Iopamidol] Hives    After returning home pt called to inform technologist of rash to face and neck, denies difficulty breathing or swallowing, pt took 2 benedryl Will need premeds in future   Amoxicillin Other (See Comments)    Headache  Cephalosporin tolerant 07/23/2021   Belviq Xr [Lorcaserin Hcl Er] Itching  Caused her face to turn red.   Saxenda [Liraglutide -Weight Management] Other (See Comments)    Caused indigestion.   Penicillins Rash    Cephalosporin Tolerant 07/23/2021     Review of Systems  Constitutional:  Negative for fever and malaise/fatigue.  HENT:  Negative for congestion.   Eyes:  Negative for blurred vision.  Respiratory:  Negative for shortness of breath.   Cardiovascular:  Negative for chest pain, palpitations and leg swelling.  Gastrointestinal:  Negative for abdominal pain, blood in stool and nausea.  Genitourinary:  Negative for dysuria and frequency.  Musculoskeletal:  Negative for falls.  Skin:  Negative for rash.  Neurological:  Negative for dizziness, loss of consciousness and headaches.  Endo/Heme/Allergies:  Negative for environmental allergies.  Psychiatric/Behavioral:  Negative for depression. The patient is not nervous/anxious.        Objective:    Physical Exam Vitals and nursing note reviewed.  Constitutional:      Appearance: She is well-developed.  HENT:     Head: Normocephalic and atraumatic.  Eyes:     Conjunctiva/sclera: Conjunctivae normal.  Neck:     Thyroid: No thyromegaly.     Vascular: No carotid bruit or JVD.  Cardiovascular:     Rate and Rhythm: Normal rate and regular rhythm.     Heart sounds: Normal heart sounds. No murmur heard. Pulmonary:     Effort: Pulmonary effort is normal. No respiratory distress.     Breath sounds: Normal breath sounds. No wheezing or rales.  Chest:     Chest wall:  No tenderness.  Musculoskeletal:     Cervical back: Normal range of motion and neck supple.  Lymphadenopathy:     Cervical: No cervical adenopathy.  Neurological:     Mental Status: She is alert and oriented to person, place, and time.     BP (!) 120/90 (BP Location: Right Arm, Patient Position: Sitting, Cuff Size: Normal)   Pulse 74   Temp 98.7 F (37.1 C) (Oral)   Resp 18   Ht '5\' 7"'$  (1.702 m)   Wt 179 lb (81.2 kg)   SpO2 98%   BMI 28.04 kg/m  Wt Readings from Last 3 Encounters:  11/01/22 179 lb (81.2 kg)  10/09/22 182 lb 3.2 oz (82.6 kg)  09/10/22 181 lb 9.6 oz (82.4 kg)       Assessment & Plan:  Hypothyroidism, unspecified type -     TSH -     Levothyroxine Sodium; TAKE 1 TABLET(88 MCG) BY MOUTH DAILY  Dispense: 90 tablet; Refill: 1  Age-related osteoporosis with current pathological fracture with delayed healing, subsequent encounter -     VITAMIN D 25 Hydroxy (Vit-D Deficiency, Fractures)  Hyperlipidemia, unspecified hyperlipidemia type -     Comprehensive metabolic panel -     Lipid panel -     Pravastatin Sodium; TAKE 1 TABLET(20 MG) BY MOUTH DAILY  Dispense: 90 tablet; Refill: 1  Primary hypertension -     CBC with Differential/Platelet -     Comprehensive metabolic panel -     Lipid panel -     TSH -     VITAMIN D 25 Hydroxy (Vit-D Deficiency, Fractures) -     Olmesartan Medoxomil; Take 1 tablet (40 mg total) by mouth daily.  Dispense: 90 tablet; Refill: 3  Anxiety  Vitamin D deficiency -     Vitamin D (Ergocalciferol); Take 1 capsule (50,000 Units total) by mouth every 7 (seven) days.  Dispense: 12 capsule; Refill: 1  I,Rachel Rivera,acting as a Education administrator for [employer], DO.,have documented all relevant documentation on the behalf of Ann Held, DO,as directed by  Ann Held, DO while in the presence of [place], DO, personally preformed the services described in this  documentation.  All medical record entries made by the scribe were at my direction and in my presence.  I have reviewed the chart and discharge instructions (if applicable) and agree that the record reflects my personal performance and is accurate and complete. 11/01/22   Ann Held, DO

## 2022-11-07 ENCOUNTER — Ambulatory Visit: Payer: BC Managed Care – PPO

## 2022-11-08 ENCOUNTER — Ambulatory Visit (INDEPENDENT_AMBULATORY_CARE_PROVIDER_SITE_OTHER): Payer: Medicare Other

## 2022-11-08 VITALS — BP 153/79 | HR 63 | Temp 97.1°F | Resp 18 | Ht 67.0 in | Wt 180.0 lb

## 2022-11-08 DIAGNOSIS — M8000XG Age-related osteoporosis with current pathological fracture, unspecified site, subsequent encounter for fracture with delayed healing: Secondary | ICD-10-CM

## 2022-11-08 MED ORDER — ROMOSOZUMAB-AQQG 105 MG/1.17ML ~~LOC~~ SOSY
210.0000 mg | PREFILLED_SYRINGE | Freq: Once | SUBCUTANEOUS | Status: AC
  Administered 2022-11-08: 210 mg via SUBCUTANEOUS

## 2022-11-08 NOTE — Progress Notes (Signed)
Diagnosis: Osteoporosis  Provider:  Marshell Garfinkel MD  Procedure: Injection  Evenity (Romosozumab-aqqg), Dose: 210 mg, Site: subcutaneous, Number of injections: 2  Post Care: Patient declined observation  Discharge: Condition: Good, Destination: Home . AVS Declined  Performed by:  Cleophus Molt, RN

## 2022-11-25 ENCOUNTER — Telehealth: Payer: Self-pay | Admitting: Family Medicine

## 2022-11-25 NOTE — Telephone Encounter (Signed)
Contacted Kathryn Boyer to schedule their annual wellness visit. Appointment made for 12/02/2022.  Kathryn Boyer; [facility] Direct Dial: [phone]

## 2022-12-02 ENCOUNTER — Ambulatory Visit (INDEPENDENT_AMBULATORY_CARE_PROVIDER_SITE_OTHER): Payer: BC Managed Care – PPO | Admitting: *Deleted

## 2022-12-02 VITALS — HR 72 | Ht 67.0 in | Wt 179.0 lb

## 2022-12-02 DIAGNOSIS — Z0000 Encounter for general adult medical examination without abnormal findings: Secondary | ICD-10-CM | POA: Diagnosis not present

## 2022-12-02 NOTE — Patient Instructions (Signed)
Ms. Kathryn Boyer , Thank you for taking time to come for your Medicare Wellness Visit. I appreciate your ongoing commitment to your health goals. Please review the following plan we discussed and let me know if I can assist you in the future.   These are the goals we discussed:  Goals   None     This is a list of the screening recommended for you and due dates:  Health Maintenance  Topic Date Due   Zoster (Shingles) Vaccine (2 of 2) 07/13/2019   COVID-19 Vaccine (4 - 2023-24 season) 04/26/2022   Flu Shot  03/27/2023   Mammogram  04/17/2023   Medicare Annual Wellness Visit  12/02/2023   Colon Cancer Screening  06/05/2025   DTaP/Tdap/Td vaccine (3 - Td or Tdap) 09/07/2025   Pneumonia Vaccine  Completed   DEXA scan (bone density measurement)  Completed   Hepatitis C Screening: USPSTF Recommendation to screen - Ages 62-79 yo.  Completed   HPV Vaccine  Aged Out     Next appointment: Follow up in one year for your annual wellness visit.   Preventive Care 31 Years and Older, Female Preventive care refers to lifestyle choices and visits with your health care provider that can promote health and wellness. What does preventive care include? A yearly physical exam. This is also called an annual well check. Dental exams once or twice a year. Routine eye exams. Ask your health care provider how often you should have your eyes checked. Personal lifestyle choices, including: Daily care of your teeth and gums. Regular physical activity. Eating a healthy diet. Avoiding tobacco and drug use. Limiting alcohol use. Practicing safe sex. Taking low-dose aspirin every day. Taking vitamin and mineral supplements as recommended by your health care provider. What happens during an annual well check? The services and screenings done by your health care provider during your annual well check will depend on your age, overall health, lifestyle risk factors, and family history of disease. Counseling  Your  health care provider may ask you questions about your: Alcohol use. Tobacco use. Drug use. Emotional well-being. Home and relationship well-being. Sexual activity. Eating habits. History of falls. Memory and ability to understand (cognition). Work and work Astronomer. Reproductive health. Screening  You may have the following tests or measurements: Height, weight, and BMI. Blood pressure. Lipid and cholesterol levels. These may be checked every 5 years, or more frequently if you are over 32 years old. Skin check. Lung cancer screening. You may have this screening every year starting at age 29 if you have a 30-pack-year history of smoking and currently smoke or have quit within the past 15 years. Fecal occult blood test (FOBT) of the stool. You may have this test every year starting at age 52. Flexible sigmoidoscopy or colonoscopy. You may have a sigmoidoscopy every 5 years or a colonoscopy every 10 years starting at age 25. Hepatitis C blood test. Hepatitis B blood test. Sexually transmitted disease (STD) testing. Diabetes screening. This is done by checking your blood sugar (glucose) after you have not eaten for a while (fasting). You may have this done every 1-3 years. Bone density scan. This is done to screen for osteoporosis. You may have this done starting at age 65. Mammogram. This may be done every 1-2 years. Talk to your health care provider about how often you should have regular mammograms. Talk with your health care provider about your test results, treatment options, and if necessary, the need for more tests. Vaccines  Your  health care provider may recommend certain vaccines, such as: Influenza vaccine. This is recommended every year. Tetanus, diphtheria, and acellular pertussis (Tdap, Td) vaccine. You may need a Td booster every 10 years. Zoster vaccine. You may need this after age 35. Pneumococcal 13-valent conjugate (PCV13) vaccine. One dose is recommended after age  70. Pneumococcal polysaccharide (PPSV23) vaccine. One dose is recommended after age 72. Talk to your health care provider about which screenings and vaccines you need and how often you need them. This information is not intended to replace advice given to you by your health care provider. Make sure you discuss any questions you have with your health care provider. Document Released: 09/08/2015 Document Revised: 05/01/2016 Document Reviewed: 06/13/2015 Elsevier Interactive Patient Education  2017 [employer] Prevention in the Home Falls can cause injuries. They can happen to people of all ages. There are many things you can do to make your home safe and to help prevent falls. What can I do on the outside of my home? Regularly fix the edges of walkways and driveways and fix any cracks. Remove anything that might make you trip as you walk through a door, such as a raised step or threshold. Trim any bushes or trees on the path to your home. Use bright outdoor lighting. Clear any walking paths of anything that might make someone trip, such as rocks or tools. Regularly check to see if handrails are loose or broken. Make sure that both sides of any steps have handrails. Any raised decks and porches should have guardrails on the edges. Have any leaves, snow, or ice cleared regularly. Use sand or salt on walking paths during winter. Clean up any spills in your garage right away. This includes oil or grease spills. What can I do in the bathroom? Use night lights. Install grab bars by the toilet and in the tub and shower. Do not use towel bars as grab bars. Use non-skid mats or decals in the tub or shower. If you need to sit down in the shower, use a plastic, non-slip stool. Keep the floor dry. Clean up any water that spills on the floor as soon as it happens. Remove soap buildup in the tub or shower regularly. Attach bath mats securely with double-sided non-slip rug tape. Do not have throw  rugs and other things on the floor that can make you trip. What can I do in the bedroom? Use night lights. Make sure that you have a light by your bed that is easy to reach. Do not use any sheets or blankets that are too big for your bed. They should not hang down onto the floor. Have a firm chair that has side arms. You can use this for support while you get dressed. Do not have throw rugs and other things on the floor that can make you trip. What can I do in the kitchen? Clean up any spills right away. Avoid walking on wet floors. Keep items that you use a lot in easy-to-reach places. If you need to reach something above you, use a strong step stool that has a grab bar. Keep electrical cords out of the way. Do not use floor polish or wax that makes floors slippery. If you must use wax, use non-skid floor wax. Do not have throw rugs and other things on the floor that can make you trip. What can I do with my stairs? Do not leave any items on the stairs. Make sure that there are handrails  on both sides of the stairs and use them. Fix handrails that are broken or loose. Make sure that handrails are as long as the stairways. Check any carpeting to make sure that it is firmly attached to the stairs. Fix any carpet that is loose or worn. Avoid having throw rugs at the top or bottom of the stairs. If you do have throw rugs, attach them to the floor with carpet tape. Make sure that you have a light switch at the top of the stairs and the bottom of the stairs. If you do not have them, ask someone to add them for you. What else can I do to help prevent falls? Wear shoes that: Do not have high heels. Have rubber bottoms. Are comfortable and fit you well. Are closed at the toe. Do not wear sandals. If you use a stepladder: Make sure that it is fully opened. Do not climb a closed stepladder. Make sure that both sides of the stepladder are locked into place. Ask someone to hold it for you, if  possible. Clearly mark and make sure that you can see: Any grab bars or handrails. First and last steps. Where the edge of each step is. Use tools that help you move around (mobility aids) if they are needed. These include: Canes. Walkers. Scooters. Crutches. Turn on the lights when you go into a dark area. Replace any light bulbs as soon as they burn out. Set up your furniture so you have a clear path. Avoid moving your furniture around. If any of your floors are uneven, fix them. If there are any pets around you, be aware of where they are. Review your medicines with your doctor. Some medicines can make you feel dizzy. This can increase your chance of falling. Ask your doctor what other things that you can do to help prevent falls. This information is not intended to replace advice given to you by your health care provider. Make sure you discuss any questions you have with your health care provider. Document Released: 06/08/2009 Document Revised: 01/18/2016 Document Reviewed: 09/16/2014 Elsevier Interactive Patient Education  2017 [employer].

## 2022-12-02 NOTE — Progress Notes (Signed)
Subjective:   Kathryn Boyer is a 67 y.o. female who presents for an Initial Medicare Annual Wellness Visit.  I connected with  [name] on 12/02/22 by a audio enabled telemedicine application and verified that I am speaking with the correct person using two identifiers.  Patient Location: Home  Provider Location: Office/Clinic  I discussed the limitations of evaluation and management by telemedicine. The patient expressed understanding and agreed to proceed.   Review of Systems     Cardiac Risk Factors include: advanced age (>51men, >65 women);hypertension;dyslipidemia     Objective:    Today's Vitals   12/02/22 0940  Pulse: 72  Weight: 179 lb (81.2 kg)  Height: 5\' 7"  (1.702 m)   Body mass index is 28.04 kg/m.     12/02/2022    9:51 AM 01/08/2022    6:27 AM 10/10/2021    9:47 AM 07/27/2021    7:00 PM 07/27/2021    6:49 PM 07/20/2021    2:45 PM 07/20/2021   10:35 AM  Advanced Directives  Does Patient Have a Medical Advance Directive? Yes Yes Yes  Yes Yes Yes  Type of Estate agent of [place];Living will Healthcare Power of [place];Living will Healthcare Power of [place];Living will Healthcare Power of [place];Living will  Healthcare Power of [place];Living will Healthcare Power of Attorney  Does patient want to make changes to medical advance directive? No - Patient declined   No - Patient declined Yes (Inpatient - patient defers changing a medical advance directive and declines information at this time) No - Patient declined No - Patient declined  Copy of Healthcare Power of Attorney in Chart? No - copy requested No - copy requested No - copy requested No - copy requested No - copy requested No - copy requested     Current Medications (verified) Outpatient Encounter Medications as of 12/02/2022  Medication Sig   ALPRAZolam (XANAX) 0.5 MG tablet Take 1 tablet (0.5 mg total) by mouth 3 (three) times daily as needed for anxiety.    Camphor-Menthol-Methyl Sal (SALONPAS) 3.08-31-08 % PTCH Apply 5 patches topically daily.   cyclobenzaprine (FLEXERIL) 5 MG tablet Take 5 mg by mouth every 8 (eight) hours as needed for pain.   diclofenac Sodium (VOLTAREN) 1 % GEL Apply 2 g topically daily as needed (pain).   diphenhydrAMINE (BENADRYL) 25 MG tablet Take 25 mg by mouth every 6 (six) hours as needed for allergies.   estradiol (ESTRACE) 0.1 MG/GM vaginal cream Place 1 Applicatorful vaginally 3 (three) times a week.   famotidine-calcium carbonate-magnesium hydroxide (PEPCID COMPLETE) 10-800-165 MG chewable tablet Chew 1 tablet by mouth daily as needed (Heartburn).   ibuprofen (ADVIL) 200 MG tablet Take 600 mg by mouth every 6 (six) hours as needed for mild pain or moderate pain.   levothyroxine (SYNTHROID) 88 MCG tablet TAKE 1 TABLET(88 MCG) BY MOUTH DAILY   Multiple Vitamins-Minerals (MULTIVITAMIN WITH MINERALS) tablet Take 1 tablet by mouth daily. V thrive   olmesartan (BENICAR) 40 MG tablet Take 1 tablet (40 mg total) by mouth daily.   omeprazole (PRILOSEC) 40 MG capsule Take 40 mg by mouth daily.   ondansetron (ZOFRAN-ODT) 4 MG disintegrating tablet Take 1 tablet (4 mg total) by mouth every 8 (eight) hours as needed for nausea or vomiting.   OVER THE COUNTER MEDICATION Apply 1 application. topically daily as needed (Pain). Body Balm   pravastatin (PRAVACHOL) 20 MG tablet TAKE 1 TABLET(20 MG) BY MOUTH DAILY   Probiotic Product (PROBIOTIC PO) Take 1 capsule  by mouth daily.   Romosozumab-aqqg (EVENITY) 105 MG/1.17ML SOSY injection Inject 210 mg into the skin every 30 (thirty) days.   VENTOLIN HFA 108 (90 Base) MCG/ACT inhaler Inhale 2 puffs into the lungs every 4 (four) hours as needed for wheezing or shortness of breath.   Vitamin D, Ergocalciferol, (DRISDOL) 1.25 MG (50000 UNIT) CAPS capsule Take 1 capsule (50,000 Units total) by mouth every 7 (seven) days.   [DISCONTINUED] chlorpheniramine-HYDROcodone (TUSSIONEX) 10-8 MG/5ML Take 5  mLs by mouth at bedtime as needed for cough.   [DISCONTINUED] oxyCODONE-acetaminophen (PERCOCET) 5-325 MG tablet Take 1-2 tablets by mouth every 8 (eight) hours as needed for severe pain.   No facility-administered encounter medications on file as of 12/02/2022.    Allergies (verified) Contrast media [iodinated contrast media], Isovue [iopamidol], Amoxicillin, Belviq xr [lorcaserin hcl er], Saxenda [liraglutide -weight management], and Penicillins   History: Past Medical History:  Diagnosis Date   Anxiety    Asthma    GERD (gastroesophageal reflux disease)    Hyperlipidemia    Hypertension    Plantar fasciitis    Thyroid disease    Past Surgical History:  Procedure Laterality Date   ABDOMINAL HYSTERECTOMY     BREAST SURGERY     HARDWARE REMOVAL Left 01/08/2022   Procedure: HARDWARE REMOVAL;  Surgeon: Myrene GalasHandy, Michael, MD;  Location: [facility] OR;  Service: Orthopedics;  Laterality: Left;   NOSE SURGERY     ORIF ANKLE FRACTURE Left 07/23/2021   Procedure: OPEN REDUCTION INTERNAL FIXATION (ORIF) ANKLE FRACTURE;  Surgeon: Myrene GalasHandy, Michael, MD;  Location: [facility] OR;  Service: Orthopedics;  Laterality: Left;   REVERSE SHOULDER ARTHROPLASTY Left 07/25/2021   Procedure: LEFT REVERSE SHOULDER ARTHROPLASTY;  Surgeon: Bjorn PippinVarkey, Dax T, MD;  Location: [facility] OR;  Service: Orthopedics;  Laterality: Left;   Family History  Problem Relation Age of Onset   Heart disease Mother    Stroke Mother    Hypertension Mother    Cancer Mother        ovarian   Aneurysm Father    Hypertension Father    Heart disease Father    Kidney disease Father    Heart disease Sister    Social History   Socioeconomic History   Marital status: Married    Spouse name: Not on file   Number of children: Not on file   Years of education: Not on file   Highest education level: Not on file  Occupational History   Not on file  Tobacco Use   Smoking status: Never   Smokeless tobacco: Never  Vaping Use   Vaping Use: Never used   Substance and Sexual Activity   Alcohol use: No   Drug use: No   Sexual activity: Not on file  Other Topics Concern   Not on file  Social History Narrative   Exercise-sometimes   Social Determinants of Health   Financial Resource Strain: Low Risk  (12/02/2022)   Overall Financial Resource Strain (CARDIA)    Difficulty of Paying Living Expenses: Not hard at all  Food Insecurity: No Food Insecurity (12/02/2022)   Hunger Vital Sign    Worried About Running Out of Food in the Last Year: Never true    Ran Out of Food in the Last Year: Never true  Transportation Needs: No Transportation Needs (12/02/2022)   PRAPARE - Administrator, Civil ServiceTransportation    Lack of Transportation (Medical): No    Lack of Transportation (Non-Medical): No  Physical Activity: Sufficiently Active (12/02/2022)   Exercise Vital Sign  Days of Exercise per Week: 5 days    Minutes of Exercise per Session: 60 min  Stress: No Stress Concern Present (12/02/2022)   [employer] of Occupational Health - Occupational Stress Questionnaire    Feeling of Stress : Not at all  Social Connections: Moderately Integrated (12/02/2022)   Social Connection and Isolation Panel [NHANES]    Frequency of Communication with Friends and Family: More than three times a week    Frequency of Social Gatherings with Friends and Family: Once a week    Attends Religious Services: More than 4 times per year    Active Member of [employer] or Organizations: No    Attends Engineer, structural: Never    Marital Status: Married    Tobacco Counseling Counseling given: Not Answered   Clinical Intake:  Pre-visit preparation completed: Yes  Pain : No/denies pain  BMI - recorded: 28.04 Nutritional Status: BMI 25 -29 Overweight Nutritional Risks: None Diabetes: No  How often do you need to have someone help you when you read instructions, pamphlets, or other written materials from your doctor or pharmacy?: 1 - Never  Activities of Daily Living     12/02/2022   10:10 AM 01/08/2022    6:26 AM  In your present state of health, do you have any difficulty performing the following activities:  Hearing? 0   Vision? 1   Difficulty concentrating or making decisions? 0   Walking or climbing stairs? 1   Dressing or bathing? 0   Doing errands, shopping? 0 0  Preparing Food and eating ? N   Using the Toilet? N   In the past six months, have you accidently leaked urine? Y   Do you have problems with loss of bowel control? N   Managing your Medications? N   Managing your Finances? N   Housekeeping or managing your Housekeeping? N     Patient Care Team: Zola Button, Grayling Congress, DO as PCP - General (Family Medicine) Parke Poisson, MD as PCP - Cardiology (Cardiology) Blondell Reveal, MD (Obstetrics and Gynecology) Sydnee Cabal., MD as Referring Physician (Gastroenterology) Fletcher Anon, MD (Inactive) as Consulting Physician (Allergy and Immunology) Althea Charon, MD (Radiology) Wynona Canes, MD as Referring Physician (Urology)  Indicate any recent Medical Services you may have received from other than [facility] providers in the past year (date may be approximate).     Assessment:   This is a routine wellness examination for Jolissa.  Hearing/Vision screen No results found.  Dietary issues and exercise activities discussed: Current Exercise Habits: Home exercise routine, Type of exercise: strength training/weights;walking, Time (Minutes): 60, Frequency (Times/Week): 5, Weekly Exercise (Minutes/Week): 300, Intensity: Moderate, Exercise limited by: orthopedic condition(s)   Goals Addressed   None    Depression Screen    12/02/2022   10:14 AM 08/06/2022    2:02 PM 04/30/2022    9:31 AM 04/19/2021    8:39 AM 02/24/2020    9:27 AM 11/18/2017    3:00 PM  PHQ 2/9 Scores  PHQ - 2 Score 0 0 0 0 0 6  PHQ- 9 Score      6    Fall Risk    12/02/2022    9:58 AM 08/06/2022    2:01 PM 10/26/2021    9:02 AM 10/20/2020    1:37  PM 11/18/2017    3:00 PM  Fall Risk   Falls in the past year? 1 0 0 0 No  Comment tripped over  hose      Number falls in past yr: 0 0 0 0   Injury with Fall? 1 0 0 0   Risk for fall due to : History of fall(s) History of fall(s)     Follow up Falls evaluation completed Falls evaluation completed Falls evaluation completed Falls evaluation completed     FALL RISK PREVENTION PERTAINING TO THE HOME:  Any stairs in or around the home? Yes  If so, are there any without handrails? No  Home free of loose throw rugs in walkways, pet beds, electrical cords, etc? Yes  Adequate lighting in your home to reduce risk of falls? Yes   ASSISTIVE DEVICES UTILIZED TO PREVENT FALLS:  Life alert? No  Use of a cane, walker or w/c? No  Grab bars in the bathroom? No  Shower chair or bench in shower?  Built in Elevated toilet seat or a handicapped toilet? Yes   TIMED UP AND GO:  Was the test performed?  No, audio visit .    Cognitive Function:    12/02/2022   10:14 AM  MMSE - Mini Mental State Exam  Not completed: Unable to complete        Immunizations Immunization History  Administered Date(s) Administered   Fluad Quad(high Dose 65+) 10/20/2020, 10/26/2021   Influenza,inj,Quad PF,6+ Mos 05/26/2015, 07/08/2016, 06/19/2017, 06/22/2018, 04/29/2019   Moderna Sars-Covid-2 Vaccination 10/27/2019, 12/01/2019   PFIZER(Purple Top)SARS-COV-2 Vaccination 08/10/2020   PNEUMOCOCCAL CONJUGATE-20 10/26/2021   Tdap 07/15/2011, 09/08/2015   Zoster Recombinat (Shingrix) 05/18/2019    TDAP status: Up to date  Flu Vaccine status: Up to date  Pneumococcal vaccine status: Up to date  Covid-19 vaccine status: Information provided on how to obtain vaccines.   Qualifies for Shingles Vaccine? Yes   Zostavax completed No   Shingrix Completed?: No.    Education has been provided regarding the importance of this vaccine. Patient has been advised to call insurance company to determine out of pocket expense if  they have not yet received this vaccine. Advised may also receive vaccine at local pharmacy or Health Dept. Verbalized acceptance and understanding.  Screening Tests Health Maintenance  Topic Date Due   Medicare Annual Wellness (AWV)  Never done   Zoster Vaccines- Shingrix (2 of 2) 07/13/2019   COVID-19 Vaccine (4 - 2023-24 season) 04/26/2022   INFLUENZA VACCINE  03/27/2023   MAMMOGRAM  04/17/2023   COLONOSCOPY (Pts 45-62yrs Insurance coverage will need to be confirmed)  06/05/2025   DTaP/Tdap/Td (3 - Td or Tdap) 09/07/2025   Pneumonia Vaccine 43+ Years old  Completed   DEXA SCAN  Completed   Hepatitis C Screening  Completed   HPV VACCINES  Aged Out    Health Maintenance  Health Maintenance Due  Topic Date Due   Medicare Annual Wellness (AWV)  Never done   Zoster Vaccines- Shingrix (2 of 2) 07/13/2019   COVID-19 Vaccine (4 - 2023-24 season) 04/26/2022    Colorectal cancer screening: Type of screening: Colonoscopy. Completed 06/06/15. Repeat every 10 years  Mammogram status: Completed 04/16/22. Repeat every year  Bone Density status: Completed 09/11/21. Results reflect: Bone density results: OSTEOPOROSIS. Repeat every 2 years.  Lung Cancer Screening: (Low Dose CT Chest recommended if Age 35-80 years, 30 pack-year currently smoking OR have quit w/in 15years.) does not qualify.   Additional Screening:  Hepatitis C Screening: does qualify; Completed 05/29/15  Vision Screening: Recommended annual ophthalmology exams for early detection of glaucoma and other disorders of the eye. Is the patient up  to date with their annual eye exam?  Yes  Who is the provider or what is the name of the office in which the patient attends annual eye exams? [facility] If pt is not established with a provider, would they like to be referred to a provider to establish care? No .   Dental Screening: Recommended annual dental exams for proper oral hygiene  Community Resource Referral / Chronic Care  Management: CRR required this visit?  No   CCM required this visit?  No      Plan:     I have personally reviewed and noted the following in the patient's chart:   Medical and social history Use of alcohol, tobacco or illicit drugs  Current medications and supplements including opioid prescriptions. Patient is not currently taking opioid prescriptions. Functional ability and status Nutritional status Physical activity Advanced directives List of other physicians Hospitalizations, surgeries, and ER visits in previous 12 months Vitals Screenings to include cognitive, depression, and falls Referrals and appointments  In addition, I have reviewed and discussed with patient certain preventive protocols, quality metrics, and best practice recommendations. A written personalized care plan for preventive services as well as general preventive health recommendations were provided to patient.   Due to this being a telephonic visit, the after visit summary with patients personalized plan was offered to patient via mail or my-chart. Patient would like to access on my-chart.  Donne Anon, [place]   12/02/2022   Nurse Notes: None

## 2022-12-05 ENCOUNTER — Encounter: Payer: Self-pay | Admitting: Family Medicine

## 2022-12-05 ENCOUNTER — Ambulatory Visit (INDEPENDENT_AMBULATORY_CARE_PROVIDER_SITE_OTHER): Payer: Medicare Other | Admitting: Family Medicine

## 2022-12-05 VITALS — BP 120/80 | HR 66 | Temp 98.4°F | Resp 18 | Ht 67.0 in | Wt 184.4 lb

## 2022-12-05 DIAGNOSIS — Z79899 Other long term (current) drug therapy: Secondary | ICD-10-CM

## 2022-12-05 DIAGNOSIS — R300 Dysuria: Secondary | ICD-10-CM

## 2022-12-05 HISTORY — DX: Dysuria: R30.0

## 2022-12-05 HISTORY — DX: Other long term (current) drug therapy: Z79.899

## 2022-12-05 LAB — POC URINALSYSI DIPSTICK (AUTOMATED)
Bilirubin, UA: NEGATIVE
Blood, UA: NEGATIVE
Glucose, UA: NEGATIVE
Ketones, UA: NEGATIVE
Nitrite, UA: NEGATIVE
Protein, UA: NEGATIVE
Spec Grav, UA: 1.01 (ref 1.010–1.025)
Urobilinogen, UA: 0.2 E.U./dL
pH, UA: 5 (ref 5.0–8.0)

## 2022-12-05 MED ORDER — NITROFURANTOIN MONOHYD MACRO 100 MG PO CAPS
100.0000 mg | ORAL_CAPSULE | Freq: Two times a day (BID) | ORAL | 0 refills | Status: DC
Start: 2022-12-05 — End: 2023-05-05

## 2022-12-05 NOTE — Progress Notes (Addendum)
Subjective:   By signing my name below, I, [name], attest that this documentation has been prepared under the direction and in the presence of Donato Schultz, DO. 12/05/2022   Patient ID: Kathryn Boyer, female    DOB: 07-Jul-1956, 67 y.o.   MRN: [id number]  Chief Complaint  Patient presents with   Dysuria    Pt states having burning and urine has been darker, and freq. X2-3 weeks.    Dysuria  Associated symptoms include frequency. Pertinent negatives include no nausea.   Patient is in today for a office visit.   She complains of dysuria, dark color urine, and frequency for the past 2-3 weeks. She is keeping up her water intake.  She continues receiving prolia injections regularly. She is also taking collagen supplements regularly.   Past Medical History:  Diagnosis Date   Anxiety    Asthma    GERD (gastroesophageal reflux disease)    Hyperlipidemia    Hypertension    Plantar fasciitis    Thyroid disease     Past Surgical History:  Procedure Laterality Date   ABDOMINAL HYSTERECTOMY     BREAST SURGERY     HARDWARE REMOVAL Left 01/08/2022   Procedure: HARDWARE REMOVAL;  Surgeon: Myrene Galas, MD;  Location: [facility] OR;  Service: Orthopedics;  Laterality: Left;   NOSE SURGERY     ORIF ANKLE FRACTURE Left 07/23/2021   Procedure: OPEN REDUCTION INTERNAL FIXATION (ORIF) ANKLE FRACTURE;  Surgeon: Myrene Galas, MD;  Location: [facility] OR;  Service: Orthopedics;  Laterality: Left;   REVERSE SHOULDER ARTHROPLASTY Left 07/25/2021   Procedure: LEFT REVERSE SHOULDER ARTHROPLASTY;  Surgeon: Bjorn Pippin, MD;  Location: [facility] OR;  Service: Orthopedics;  Laterality: Left;    Family History  Problem Relation Age of Onset   Heart disease Mother    Stroke Mother    Hypertension Mother    Cancer Mother        ovarian   Aneurysm Father    Hypertension Father    Heart disease Father    Kidney disease Father    Heart disease Sister     Social History   Socioeconomic History    Marital status: Married    Spouse name: Not on file   Number of children: Not on file   Years of education: Not on file   Highest education level: Not on file  Occupational History   Not on file  Tobacco Use   Smoking status: Never   Smokeless tobacco: Never  Vaping Use   Vaping Use: Never used  Substance and Sexual Activity   Alcohol use: No   Drug use: No   Sexual activity: Not on file  Other Topics Concern   Not on file  Social History Narrative   Exercise-sometimes   Social Determinants of Health   Financial Resource Strain: Low Risk  (12/02/2022)   Overall Financial Resource Strain (CARDIA)    Difficulty of Paying Living Expenses: Not hard at all  Food Insecurity: No Food Insecurity (12/02/2022)   Hunger Vital Sign    Worried About Running Out of Food in the Last Year: Never true    Ran Out of Food in the Last Year: Never true  Transportation Needs: No Transportation Needs (12/02/2022)   PRAPARE - Administrator, Civil Service (Medical): No    Lack of Transportation (Non-Medical): No  Physical Activity: Sufficiently Active (12/02/2022)   Exercise Vital Sign    Days of Exercise per Week: 5 days  Minutes of Exercise per Session: 60 min  Stress: No Stress Concern Present (12/02/2022)   [employer] of Occupational Health - Occupational Stress Questionnaire    Feeling of Stress : Not at all  Social Connections: Moderately Integrated (12/02/2022)   Social Connection and Isolation Panel [NHANES]    Frequency of Communication with Friends and Family: More than three times a week    Frequency of Social Gatherings with Friends and Family: Once a week    Attends Religious Services: More than 4 times per year    Active Member of [employer] or Organizations: No    Attends Banker Meetings: Never    Marital Status: Married  Catering manager Violence: Not At Risk (12/02/2022)   Humiliation, Afraid, Rape, and Kick questionnaire    Fear of Current or Ex-Partner: No     Emotionally Abused: No    Physically Abused: No    Sexually Abused: No    Outpatient Medications Prior to Visit  Medication Sig Dispense Refill   ALPRAZolam (XANAX) 0.5 MG tablet Take 1 tablet (0.5 mg total) by mouth 3 (three) times daily as needed for anxiety. 30 tablet 0   Camphor-Menthol-Methyl Sal (SALONPAS) 3.08-31-08 % PTCH Apply 5 patches topically daily.     cyclobenzaprine (FLEXERIL) 5 MG tablet Take 5 mg by mouth every 8 (eight) hours as needed for pain.     diclofenac Sodium (VOLTAREN) 1 % GEL Apply 2 g topically daily as needed (pain).     diphenhydrAMINE (BENADRYL) 25 MG tablet Take 25 mg by mouth every 6 (six) hours as needed for allergies.     estradiol (ESTRACE) 0.1 MG/GM vaginal cream Place 1 Applicatorful vaginally 3 (three) times a week. 42.5 g 3   famotidine-calcium carbonate-magnesium hydroxide (PEPCID COMPLETE) 10-800-165 MG chewable tablet Chew 1 tablet by mouth daily as needed (Heartburn).     ibuprofen (ADVIL) 200 MG tablet Take 600 mg by mouth every 6 (six) hours as needed for mild pain or moderate pain.     levothyroxine (SYNTHROID) 88 MCG tablet TAKE 1 TABLET(88 MCG) BY MOUTH DAILY 90 tablet 1   Multiple Vitamins-Minerals (MULTIVITAMIN WITH MINERALS) tablet Take 1 tablet by mouth daily. V thrive     olmesartan (BENICAR) 40 MG tablet Take 1 tablet (40 mg total) by mouth daily. 90 tablet 3   omeprazole (PRILOSEC) 40 MG capsule Take 40 mg by mouth daily.     ondansetron (ZOFRAN-ODT) 4 MG disintegrating tablet Take 1 tablet (4 mg total) by mouth every 8 (eight) hours as needed for nausea or vomiting. 20 tablet 0   OVER THE COUNTER MEDICATION Apply 1 application. topically daily as needed (Pain). Body Balm     pravastatin (PRAVACHOL) 20 MG tablet TAKE 1 TABLET(20 MG) BY MOUTH DAILY 90 tablet 1   Probiotic Product (PROBIOTIC PO) Take 1 capsule by mouth daily.     Romosozumab-aqqg (EVENITY) 105 MG/1. SOSY injection Inject 210 mg into the skin every 30 (thirty) days.      VENTOLIN HFA 108 (90 Base) MCG/ACT inhaler Inhale 2 puffs into the lungs every 4 (four) hours as needed for wheezing or shortness of breath. 18 g 1   Vitamin D, Ergocalciferol, (DRISDOL) 1.25 MG (50000 UNIT) CAPS capsule Take 1 capsule (50,000 Units total) by mouth every 7 (seven) days. 12 capsule 1   No facility-administered medications prior to visit.    Allergies  Allergen Reactions   Contrast Media [Iodinated Contrast Media] Hives   Isovue [Iopamidol] Hives  After returning home pt called to inform technologist of rash to face and neck, denies difficulty breathing or swallowing, pt took 2 benedryl Will need premeds in future   Amoxicillin Other (See Comments)    Headache  Cephalosporin tolerant 07/23/2021   Belviq Xr [Lorcaserin Hcl Er] Itching    Caused her face to turn red.   Saxenda [Liraglutide -Weight Management] Other (See Comments)    Caused indigestion.   Penicillins Rash    Cephalosporin Tolerant 07/23/2021     Review of Systems  Constitutional:  Negative for fever and malaise/fatigue.  HENT:  Negative for congestion.   Eyes:  Negative for blurred vision.  Respiratory:  Negative for shortness of breath.   Cardiovascular:  Negative for chest pain, palpitations and leg swelling.  Gastrointestinal:  Negative for abdominal pain, blood in stool and nausea.  Genitourinary:  Positive for dysuria and frequency.       (+)darker color urine  Musculoskeletal:  Negative for falls.  Skin:  Negative for rash.  Neurological:  Negative for dizziness, loss of consciousness and headaches.  Endo/Heme/Allergies:  Negative for environmental allergies.  Psychiatric/Behavioral:  Negative for depression. The patient is not nervous/anxious.        Objective:    Physical Exam Vitals and nursing note reviewed.  Constitutional:      General: She is not in acute distress.    Appearance: Normal appearance. She is well-developed. She is not ill-appearing.  HENT:     Head:  Normocephalic and atraumatic.     Right Ear: External ear normal.     Left Ear: External ear normal.  Eyes:     Extraocular Movements: Extraocular movements intact.     Conjunctiva/sclera: Conjunctivae normal.     Pupils: Pupils are equal, round, and reactive to light.  Neck:     Thyroid: No thyromegaly.     Vascular: No carotid bruit or JVD.  Cardiovascular:     Rate and Rhythm: Normal rate and regular rhythm.     Heart sounds: Normal heart sounds. No murmur heard.    No gallop.  Pulmonary:     Effort: Pulmonary effort is normal. No respiratory distress.     Breath sounds: Normal breath sounds. No wheezing or rales.  Chest:     Chest wall: No tenderness.  Musculoskeletal:     Cervical back: Normal range of motion and neck supple.  Skin:    General: Skin is warm and dry.  Neurological:     General: No focal deficit present.     Mental Status: She is alert and oriented to person, place, and time.  Psychiatric:        Judgment: Judgment normal.     BP 120/80 (BP Location: Left Arm, Patient Position: Sitting, Cuff Size: Normal)   Pulse 66   Temp 98.4 F (36.9 C) (Oral)   Resp 18   Ht 5\' 7"  (1.702 m)   Wt 184 lb 6.4 oz (83.6 kg)   SpO2 95%   BMI 28.88 kg/m  Wt Readings from Last 3 Encounters:  12/05/22 184 lb 6.4 oz (83.6 kg)  12/02/22 179 lb (81.2 kg)  11/08/22 180 lb (81.6 kg)       Assessment & Plan:  Dysuria Assessment & Plan: Ua done + leuk, cloudy Culture pending    Orders: -     POCT Urinalysis Dipstick (Automated) -     Urine Culture -     Nitrofurantoin Monohyd Macro; Take 1 capsule (100 mg total) by mouth  2 (two) times daily.  Dispense: 14 capsule; Refill: 0  High risk medication use -     Drug Monitoring Panel [phone] , Urine    I, Donato SchultzYvonne R Lowne Chase, DO, personally preformed the services described in this documentation.  All medical record entries made by the scribe were at my direction and in my presence.  I have reviewed the chart and  discharge instructions (if applicable) and agree that the record reflects my personal performance and is accurate and complete. 12/05/2022   I,[name],acting as a scribe for Donato SchultzYvonne R Lowne Chase, DO.,have documented all relevant documentation on the behalf of Donato SchultzYvonne R Lowne Chase, DO,as directed by  Donato SchultzYvonne R Lowne Chase, DO while in the presence of Donato SchultzYvonne R Lowne Chase, DO.   Donato SchultzYvonne R Lowne Chase, DO

## 2022-12-05 NOTE — Assessment & Plan Note (Signed)
Ua done + leuk, cloudy Culture pending

## 2022-12-07 LAB — DRUG MONITORING PANEL 376104, URINE
Amphetamines: NEGATIVE ng/mL (ref <500)
Barbiturates: NEGATIVE ng/mL (ref <300)
Benzodiazepines: NEGATIVE ng/mL (ref <100)
Cocaine Metabolite: NEGATIVE ng/mL (ref <150)
Desmethyltramadol: NEGATIVE ng/mL (ref <100)
Opiates: NEGATIVE ng/mL (ref <100)
Oxycodone: NEGATIVE ng/mL (ref <100)
Tramadol: NEGATIVE ng/mL (ref <100)

## 2022-12-07 LAB — URINE CULTURE
MICRO NUMBER:: 14812074
SPECIMEN QUALITY:: ADEQUATE

## 2022-12-07 LAB — DM TEMPLATE

## 2022-12-09 ENCOUNTER — Ambulatory Visit (INDEPENDENT_AMBULATORY_CARE_PROVIDER_SITE_OTHER): Payer: Medicare Other

## 2022-12-09 VITALS — BP 142/86 | HR 68 | Temp 97.6°F | Resp 18 | Ht 67.0 in | Wt 182.0 lb

## 2022-12-09 DIAGNOSIS — M8000XG Age-related osteoporosis with current pathological fracture, unspecified site, subsequent encounter for fracture with delayed healing: Secondary | ICD-10-CM

## 2022-12-09 MED ORDER — ROMOSOZUMAB-AQQG 105 MG/1.17ML ~~LOC~~ SOSY
210.0000 mg | PREFILLED_SYRINGE | Freq: Once | SUBCUTANEOUS | Status: AC
  Administered 2022-12-09: 210 mg via SUBCUTANEOUS

## 2022-12-09 NOTE — Progress Notes (Signed)
Diagnosis: Osteoporosis  Provider:  Chilton Greathouse MD  Procedure: Injection  Evenity (Romosozumab-aqqg), Dose: 210 mg, Site: subcutaneous, Number of injections: 2 Pt completed her 10 th doses of injection at this clinic and 2 outside. Pt will follow up with her MD for further management. No new appointment schedule at this time.   Post Care:  NA  Discharge: Condition: Good, Destination: Home . AVS Declined  Performed by:  Garnette Czech, RN

## 2022-12-18 ENCOUNTER — Encounter: Payer: Self-pay | Admitting: Pulmonary Disease

## 2022-12-18 ENCOUNTER — Telehealth: Payer: Self-pay | Admitting: Family Medicine

## 2022-12-18 ENCOUNTER — Emergency Department (HOSPITAL_BASED_OUTPATIENT_CLINIC_OR_DEPARTMENT_OTHER): Payer: BC Managed Care – PPO

## 2022-12-18 ENCOUNTER — Emergency Department (HOSPITAL_BASED_OUTPATIENT_CLINIC_OR_DEPARTMENT_OTHER)
Admission: EM | Admit: 2022-12-18 | Discharge: 2022-12-18 | Disposition: A | Discharge status: Home / Self Care | Payer: BC Managed Care – PPO | Attending: Emergency Medicine | Admitting: Emergency Medicine

## 2022-12-18 ENCOUNTER — Other Ambulatory Visit: Payer: Self-pay

## 2022-12-18 ENCOUNTER — Encounter (HOSPITAL_BASED_OUTPATIENT_CLINIC_OR_DEPARTMENT_OTHER): Payer: Self-pay

## 2022-12-18 DIAGNOSIS — I10 Essential (primary) hypertension: Secondary | ICD-10-CM | POA: Diagnosis not present

## 2022-12-18 DIAGNOSIS — Z87891 Personal history of nicotine dependence: Secondary | ICD-10-CM | POA: Insufficient documentation

## 2022-12-18 DIAGNOSIS — Z79899 Other long term (current) drug therapy: Secondary | ICD-10-CM | POA: Insufficient documentation

## 2022-12-18 DIAGNOSIS — R0789 Other chest pain: Secondary | ICD-10-CM | POA: Insufficient documentation

## 2022-12-18 DIAGNOSIS — R079 Chest pain, unspecified: Secondary | ICD-10-CM

## 2022-12-18 LAB — URINALYSIS, ROUTINE W REFLEX MICROSCOPIC
Bilirubin Urine: NEGATIVE
Glucose, UA: NEGATIVE mg/dL
Ketones, ur: NEGATIVE mg/dL
Leukocytes,Ua: NEGATIVE
Nitrite: NEGATIVE
Protein, ur: NEGATIVE mg/dL
Specific Gravity, Urine: <=1.005 (ref 1.005–1.030)
pH: 5 (ref 5.0–8.0)

## 2022-12-18 LAB — BASIC METABOLIC PANEL
Anion gap: 9 (ref 5–15)
BUN: 18 mg/dL (ref 8–23)
CO2: 23 mmol/L (ref 22–32)
Calcium: 9.2 mg/dL (ref 8.9–10.3)
Chloride: 106 mmol/L (ref 98–111)
Creatinine, Ser: 0.89 mg/dL (ref 0.44–1.00)
GFR, Estimated: >60 mL/min (ref >60)
Glucose, Bld: 110 mg/dL — ABNORMAL HIGH (ref 70–99)
Potassium: 3.9 mmol/L (ref 3.5–5.1)
Sodium: 138 mmol/L (ref 135–145)

## 2022-12-18 LAB — LIPASE, BLOOD: Lipase: 35 U/L (ref 11–51)

## 2022-12-18 LAB — CBC
HCT: 44 % (ref 36.0–46.0)
Hemoglobin: 14.7 g/dL (ref 12.0–15.0)
MCH: 30.2 pg (ref 26.0–34.0)
MCHC: 33.4 g/dL (ref 30.0–36.0)
MCV: 90.3 fL (ref 80.0–100.0)
Platelets: 249 10*3/uL (ref 150–400)
RBC: 4.87 MIL/uL (ref 3.87–5.11)
RDW: 12.4 % (ref 11.5–15.5)
WBC: 8 10*3/uL (ref 4.0–10.5)
nRBC: 0 % (ref 0.0–0.2)

## 2022-12-18 LAB — TROPONIN I (HIGH SENSITIVITY)
Troponin I (High Sensitivity): 2 ng/L (ref <18)
Troponin I (High Sensitivity): 3 ng/L (ref <18)

## 2022-12-18 LAB — URINALYSIS, MICROSCOPIC (REFLEX)

## 2022-12-18 NOTE — ED Triage Notes (Signed)
States has had abdominal pain & chest pressure x 1 week, also had some upper back pain.

## 2022-12-18 NOTE — ED Provider Notes (Signed)
[place] [facility] Provider Note   CSN: [id number] Arrival date & time: 12/18/22  1056     History  Chief Complaint  Patient presents with   Chest Pain    Kathryn Boyer is a 67 y.o. female.  67 year old female with history of hypertension and hyperlipidemia who presents emergency department with chest tightness.  Says that over the past week has had intermittent chest tightness.  Says that it is worsened with eating and thought that it was reflux.  Called the nursing hotline from her outpatient doctors and they requested that she come to the emergency department for evaluation.  No diaphoresis or vomiting.  Occasionally will radiate to her right shoulder and back.  Not exertional.  No personal history of MI or stents.  Sister and brother both had MIs before the age of 16.  Smoked 40 years ago.         Home Medications Prior to Admission medications   Medication Sig Start Date End Date Taking? Authorizing Provider  ALPRAZolam Prudy Feeler) 0.5 MG tablet Take 1 tablet (0.5 mg total) by mouth 3 (three) times daily as needed for anxiety. 04/23/22   Zola Button, Yvonne R, DO  Camphor-Menthol-Methyl Sal (SALONPAS) 3.08-31-08 % PTCH Apply 5 patches topically daily.    [provider]  cyclobenzaprine (FLEXERIL) 5 MG tablet Take 5 mg by mouth every 8 (eight) hours as needed for pain. 09/14/21   [provider]  diclofenac Sodium (VOLTAREN) 1 % GEL Apply 2 g topically daily as needed (pain).    [provider]  diphenhydrAMINE (BENADRYL) 25 MG tablet Take 25 mg by mouth every 6 (six) hours as needed for allergies.    [provider]  estradiol (ESTRACE) 0.1 MG/GM vaginal cream Place 1 Applicatorful vaginally 3 (three) times a week. 12/26/17   Donato Schultz, DO  famotidine-calcium carbonate-magnesium hydroxide (PEPCID COMPLETE) 10-800-165 MG chewable tablet Chew 1 tablet by mouth daily as needed (Heartburn).    [provider]  ibuprofen (ADVIL) 200 MG tablet Take 600 mg by mouth every 6 (six) hours as needed for mild pain or moderate pain.    [provider]  levothyroxine (SYNTHROID) 88 MCG tablet TAKE 1 TABLET(88 MCG) BY MOUTH DAILY 11/01/22   Zola Button, Grayling Congress, DO  Multiple Vitamins-Minerals (MULTIVITAMIN WITH MINERALS) tablet Take 1 tablet by mouth daily. V thrive    [provider]  nitrofurantoin, macrocrystal-monohydrate, (MACROBID) 100 MG capsule Take 1 capsule (100 mg total) by mouth 2 (two) times daily. 12/05/22   Donato Schultz, DO  olmesartan (BENICAR) 40 MG tablet Take 1 tablet (40 mg total) by mouth daily. 11/01/22   Donato Schultz, DO  omeprazole (PRILOSEC) 40 MG capsule Take 40 mg by mouth daily.    [provider]  ondansetron (ZOFRAN-ODT) 4 MG disintegrating tablet Take 1 tablet (4 mg total) by mouth every 8 (eight) hours as needed for nausea or vomiting. 01/08/22   Montez Morita, PA-C  OVER THE COUNTER MEDICATION Apply 1 application. topically daily as needed (Pain). Body Balm    [provider]  pravastatin (PRAVACHOL) 20 MG tablet TAKE 1 TABLET(20 MG) BY MOUTH DAILY 11/01/22   Zola Button, Grayling Congress, DO  Probiotic Product (PROBIOTIC PO) Take 1 capsule by mouth daily.    [provider]  Romosozumab-aqqg (EVENITY) 105 MG/1. SOSY injection Inject 210 mg into the skin every 30 (thirty) days.    [provider]  VENTOLIN HFA 108 (90 Base) MCG/ACT inhaler Inhale 2 puffs into the lungs every 4 (four) hours as needed for wheezing or shortness of breath. 08/06/22   Olive Bass, FNP  Vitamin D, Ergocalciferol, (DRISDOL) 1.25 MG (50000 UNIT) CAPS capsule Take 1 capsule (50,000 Units total) by mouth every 7 (seven) days. 11/01/22   Donato Schultz, DO      Allergies    Contrast media [iodinated contrast media], Isovue [iopamidol], Amoxicillin, Belviq xr [lorcaserin hcl er], Saxenda [liraglutide -weight management],  and Penicillins    Review of Systems   Review of Systems  Physical Exam Updated Vital Signs BP 135/73   Pulse 64   Temp 97.9 F (36.6 C) (Oral)   Resp 15   Ht  (1.702 m)   Wt 81.6 kg   SpO2 100%   BMI 28.19 kg/m  Physical Exam Vitals and nursing note reviewed.  Constitutional:      General: She is not in acute distress.    Appearance: She is well-developed.  HENT:     Head: Normocephalic and atraumatic.     Right Ear: External ear normal.     Left Ear: External ear normal.     Nose: Nose normal.  Eyes:     Extraocular Movements: Extraocular movements intact.     Conjunctiva/sclera: Conjunctivae normal.     Pupils: Pupils are equal, round, and reactive to light.  Cardiovascular:     Rate and Rhythm: Normal rate and regular rhythm.     Heart sounds: No murmur heard. Pulmonary:     Effort: Pulmonary effort is normal. No respiratory distress.     Breath sounds: Normal breath sounds.  Abdominal:     General: Abdomen is flat. There is no distension.     Palpations: Abdomen is soft. There is no mass.     Tenderness: There is no abdominal tenderness. There is no guarding.  Musculoskeletal:     Cervical back: Normal range of motion and neck supple.     Right lower leg: No edema.     Left lower leg: No edema.  Skin:    General: Skin is warm and dry.  Neurological:     Mental Status: She is alert and oriented to person, place, and time. Mental status is at baseline.  Psychiatric:        Mood and Affect: Mood normal.     ED Results / Procedures / Treatments   Labs (all labs ordered are listed, but only abnormal results are displayed) Labs Reviewed  BASIC METABOLIC PANEL - Abnormal; Notable for the following components:      Result Value   Glucose, Bld 110 (*)    All other components within normal limits  URINALYSIS, ROUTINE W REFLEX MICROSCOPIC - Abnormal; Notable for the following components:   Hgb urine dipstick TRACE (*)    All other components within  normal limits  URINALYSIS, MICROSCOPIC (REFLEX) - Abnormal; Notable for the following components:   Bacteria, UA RARE (*)    All other components within normal limits  CBC  LIPASE, BLOOD  TROPONIN I (HIGH SENSITIVITY)  TROPONIN I (HIGH SENSITIVITY)    EKG EKG Interpretation  Date/Time:  Wednesday December 18 2022 11:04:31 EDT Ventricular Rate:  75 PR Interval:  167 QRS Duration: 93 QT Interval:  408 QTC Calculation: 456 R Axis:   1 Text Interpretation: Sinus rhythm Low voltage, precordial leads Borderline T abnormalities, anterior leads Confirmed by Vonita Moss [phone]) on 12/18/2022 11:10:27 AM  Radiology DG Chest 2 View  Result Date: 12/18/2022 CLINICAL DATA:  Chest pressure/heartburn for 1 week. EXAM: CHEST - 2 VIEW COMPARISON:  Chest radiograph 08/09/2022 FINDINGS: The cardiomediastinal silhouette is normal There is no focal consolidation or pulmonary edema. There is no pleural effusion or pneumothorax. Ill-defined opacity in the left apex is unchanged since 2019 and likely reflects the first costochondral junction. There is no acute osseous abnormality. Left shoulder arthroplasty hardware is noted. IMPRESSION: No radiographic evidence of acute cardiopulmonary process. Electronically Signed   By: Lesia Hausen M.D.   On: 12/18/2022 11:36    Procedures Procedures   Medications Ordered in ED Medications - No data to display  ED Course/ Medical Decision Making/ A&P             HEART Score: 5                Medical Decision Making Amount and/or Complexity of Data Reviewed Labs: ordered. Radiology: ordered.   Daija Routson is a 67 y.o. female with comorbidities that complicate the patient evaluation including history of hypertension and hyperlipidemia who presented to the emergency department with chest tightness.  Initial Ddx:  GERD, MI, pancreatitis, PE, dissection  MDM:  The patient has reflux or MI is causing her pain.  Will have her is not exertional, not  accompanied by any diaphoresis, vomiting making MI less likely.  Says that the pain worsens with eating which also argues for GERD.  No signs of severe pancreatitis such as fevers vomiting or abdominal pain.  No significant shortness of breath or other risk factors for PE at this time so feel that this is less likely.  Radial pulses 2+ and symmetric will check chest x-ray widened mediastinum is present we will consider CTA.  Plan:  Labs Lipase Troponin EKG Chest x-ray  ED Summary/Re-evaluation:  Patient underwent the above workup which was reassuring.  Her serial troponins were unremarkable.  EKGs did show nonspecific T wave changes.  She has a heart score of 5 and after shared decision-making she opted to follow-up with cardiology as an outpatient.  Cardiology referral was placed and he was also instructed to follow-up with her primary doctor in several days and continue her treatment for GERD.  This patient presents to the ED for concern of complaints listed in HPI, this involves an extensive number of treatment options, and is a complaint that carries with it a high risk of complications and morbidity. Disposition including potential need for admission considered.   Dispo: DC Home. Return precautions discussed including, but not limited to, those listed in the AVS. Allowed pt time to ask questions which were answered fully prior to dc.  Additional history obtained from spouse Records reviewed Outpatient Clinic Notes The following labs were independently interpreted: Serial Troponins and show no acute abnormality I independently reviewed the following imaging with scope of interpretation limited to determining acute life threatening conditions related to emergency care: Chest x-ray and agree with the radiologist interpretation with the following exceptions: None I personally reviewed and interpreted cardiac monitoring: normal sinus rhythm  I personally reviewed and interpreted the pt's EKG:  see above for interpretation  I have reviewed the patients home medications and made adjustments as needed  Final Clinical Impression(s) / ED Diagnoses Final diagnoses:  Chest pain, unspecified type    Rx / DC Orders ED Discharge Orders          Ordered    Ambulatory referral to Cardiology  12/18/22 1410              Rondel Baton, MD 12/18/22 1655

## 2022-12-18 NOTE — Telephone Encounter (Signed)
FYI: This call has been transferred to Access Nurse. Once the result note has been entered staff can address the message at that time.  Patient called in with the following symptoms:  Red Word: Pt was concerned with her indigestion being related to her heart issues and wanted to be sure it would be ok to wait for an appt   Please advise at Mobile [phone] (mobile)  Message is routed to Provider Pool and [facility] Triage

## 2022-12-18 NOTE — Telephone Encounter (Signed)
Noted  

## 2022-12-18 NOTE — Discharge Instructions (Signed)
You were seen for your chest pain in the emergency department.   At home, please continue taking the pepcid and over-the-counter and acids for your reflux and indigestion.    Follow-up with your primary doctor in 2-3 days regarding your visit.  Cardiology will be calling you regarding an appointment within the next 72 hours.  You may contact them if you do not hear from them in that time using the information in this packet.  Return immediately to the emergency department if you experience any of the following: Worsening pain, difficulty breathing, unexplained vomiting or sweating, or any other concerning symptoms.    Thank you for visiting our Emergency Department. It was a pleasure taking care of you today.

## 2022-12-19 NOTE — Telephone Encounter (Signed)
Pt seen at ED 

## 2022-12-19 NOTE — Telephone Encounter (Signed)
Initial Comment Caller states she's had indigestion and gastric pains for the past week along with blood pressure dropping to 127 and chest pressure Translation No Nurse Assessment Nurse: Alexander Mt, RN, Nicholaus Bloom Date/Time (Eastern Time): 12/18/2022 10:24:46 AM Confirm and document reason for call. If symptomatic, describe symptoms. ---Caller states she's had indigestion and gastric pains for the past week. states her BP has been 136/75 yesterday HR 61 and chest pressure. Does the patient have any new or worsening symptoms? ---Yes Will a triage be completed? ---Yes Related visit to physician within the last 2 weeks? ---No Does the PT have any chronic conditions? (i.e. diabetes, asthma, this includes High risk factors for pregnancy, etc.) ---Yes List chronic conditions. ---HTN, cholesterol, thyroid, GERD Is this a behavioral health or substance abuse call? ---No Guidelines Guideline Title Affirmed Question Affirmed Notes Nurse Date/Time (Eastern Time) Chest Pain [1] Chest pain lasts > 5 minutes AND [2] described as crushing, pressure-like, or heavy Alexander Mt, RN, Nicholaus Bloom 12/18/2022 10:27:57 AM Disp. Time Lamount Cohen Time) Disposition Final User 12/18/2022 10:20:32 AM Send to Urgent Michela Pitcher, Lanette PLEASE NOTE: All timestamps contained within this report are represented as [place] Standard Time. CONFIDENTIALTY NOTICE: This fax transmission is intended only for the addressee. It contains information that is legally privileged, confidential or otherwise protected from use or disclosure. If you are not the intended recipient, you are strictly prohibited from reviewing, disclosing, copying using or disseminating any of this information or taking any action in reliance on or regarding this information. If you have received this fax in error, please notify us immediately by telephone so that we can arrange for its return to [place]. Phone: [phone], Toll-Free: [phone], Fax:  [phone] Page: 2 of 2 Call Id: [id number] Disp. Time Lamount Cohen Time) Disposition Final User 12/18/2022 10:29:07 AM Call EMS 911 Now Yes Alexander Mt, RN, Nicholaus Bloom 12/18/2022 10:30:16 AM 911 Outcome Documentation Alexander Mt, RN, Nicholaus Bloom Reason: refuses 911 but will drive to the ER Final Disposition 12/18/2022 10:29:07 AM Call EMS 911 Now Yes Alexander Mt, RN, Prentiss Bells Disagree/Comply Comply Caller Understands Yes PreDisposition Did not know what to do Care Advice Given Per Guideline CALL EMS 911 NOW: * Immediate medical attention is needed. You need to hang up and call 911 (or an ambulance). NOTE TO TRIAGER - IF CALLER ASKS ABOUT ASPIRIN: * Call EMS 911 first. CARE ADVICE given per Chest Pain (Adult) guideline. Referrals [facility]

## 2023-01-08 ENCOUNTER — Telehealth: Payer: Self-pay | Admitting: Family Medicine

## 2023-01-08 DIAGNOSIS — M8000XG Age-related osteoporosis with current pathological fracture, unspecified site, subsequent encounter for fracture with delayed healing: Secondary | ICD-10-CM

## 2023-01-08 NOTE — Telephone Encounter (Signed)
Pt called stating she would like to get another Rx for [facility] as, at the time when she had the previous one, the cost of it was too high and she could only get it downstairs.

## 2023-01-08 NOTE — Telephone Encounter (Signed)
Pt called stating that she would like to have her bone density scan rerouted to [facility] so that they can compare her progress with [place].

## 2023-01-10 ENCOUNTER — Other Ambulatory Visit (HOSPITAL_BASED_OUTPATIENT_CLINIC_OR_DEPARTMENT_OTHER): Payer: Self-pay

## 2023-01-10 MED ORDER — WEGOVY 0.25 MG/0.5ML ~~LOC~~ SOAJ
0.2500 mg | SUBCUTANEOUS | 0 refills | Status: DC
  Filled 2023-01-10: qty 2, 28d supply, fill #0

## 2023-01-10 NOTE — Addendum Note (Signed)
Addended by: Roxanne Gates on: 01/10/2023 11:45 AM   Modules accepted: Orders

## 2023-01-10 NOTE — Telephone Encounter (Signed)
New orders placed

## 2023-01-10 NOTE — Addendum Note (Signed)
Addended by: Roxanne Gates on: 01/10/2023 11:22 AM   Modules accepted: Orders

## 2023-01-10 NOTE — Telephone Encounter (Signed)
Rx sent downstairs.  

## 2023-01-13 ENCOUNTER — Encounter: Payer: Self-pay | Admitting: Internal Medicine

## 2023-01-16 ENCOUNTER — Other Ambulatory Visit: Payer: Self-pay | Admitting: Pharmacy Technician

## 2023-01-21 ENCOUNTER — Telehealth: Payer: Self-pay | Admitting: Family Medicine

## 2023-01-21 NOTE — Telephone Encounter (Signed)
Pt called to follow up on previous call regarding a muscle relaxer being called in for her cramps in her calves. Pt does not want to go through that again tonight. Please give her a call to advise if Dr. Laury Axon is willing to send this in.

## 2023-01-21 NOTE — Telephone Encounter (Signed)
Pt called stating that she wanted to lok into having some muscle relaxer's sent in to help with her leg cramping issues. Pt was advised that an appt or a MyChart message would have to be done to facilitate this. Pt stated that Dr. Laury Axon had called them in for her before. Advised pt a note would be sent back to look into this for her and we would call her back with our findings.

## 2023-01-22 ENCOUNTER — Other Ambulatory Visit: Payer: Self-pay | Admitting: Family Medicine

## 2023-01-22 MED ORDER — CYCLOBENZAPRINE HCL 10 MG PO TABS: 10.0000 mg | ORAL_TABLET | Freq: Three times a day (TID) | ORAL | 0 refills | Status: DC | PRN

## 2023-01-22 NOTE — Telephone Encounter (Signed)
Noted. LVM to advise patient.

## 2023-02-20 ENCOUNTER — Other Ambulatory Visit: Payer: Self-pay | Admitting: Family Medicine

## 2023-02-20 DIAGNOSIS — E039 Hypothyroidism, unspecified: Secondary | ICD-10-CM

## 2023-03-01 IMAGING — CT CT ANKLE*L* W/O CM
3 of 4 series · 13 of 33 positions shown, 16 images · non-contrast
Comparison: None.

CLINICAL DATA: Left ankle pain.

EXAM:
CT OF THE LEFT ANKLE WITHOUT CONTRAST
TECHNIQUE: Multidetector CT imaging of the left ankle was performed according
to the standard protocol. Multiplanar CT image reconstructions were
also generated.
RADIATION DOSE REDUCTION: This exam was performed according to the
departmental dose-optimization program which includes automated
exposure control, adjustment of the mA and/or kV according to
patient size and/or use of iterative reconstruction technique.

[Series 6: axial st · axial · 0.27mm/px · z∈[-150,-4]mm · 7 of 174 slices shown, 9 images]
[im 14/174  soft-tissue]
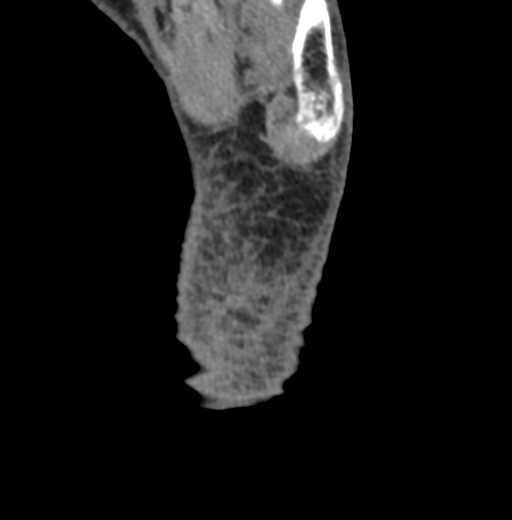
[im 14/174  bone]
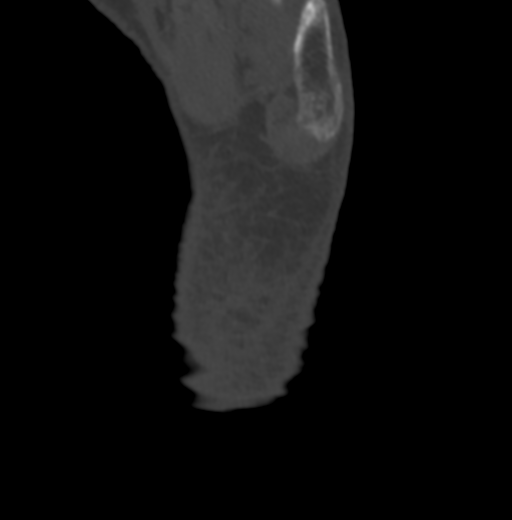
[im 40/174  bone]
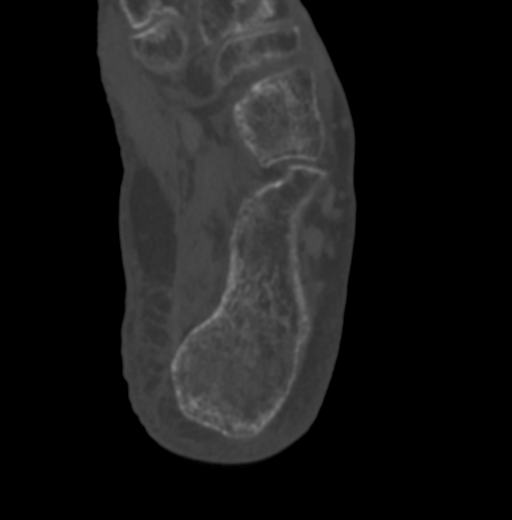
[im 67/174  bone]
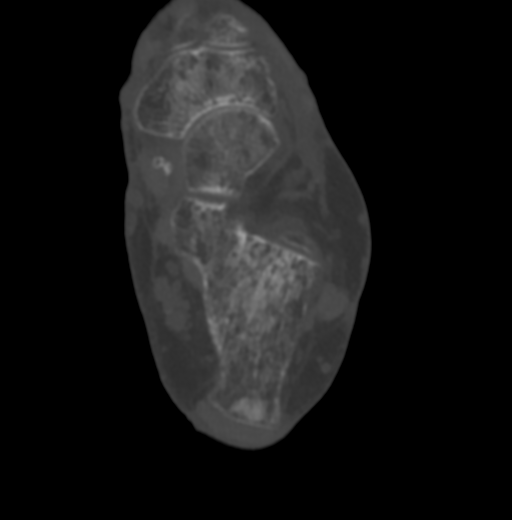
[im 94/174  bone]
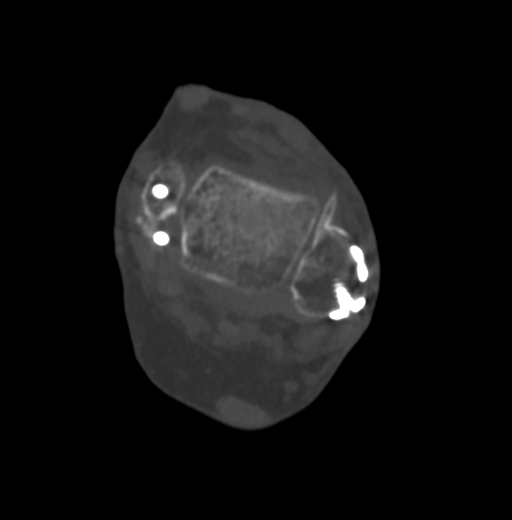
[im 107/174  soft-tissue]
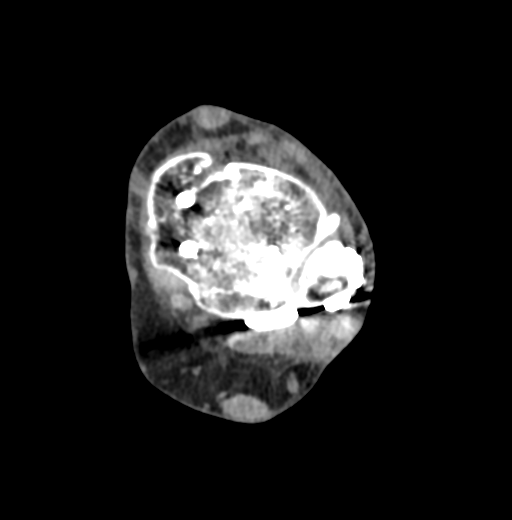
[im 107/174  bone]
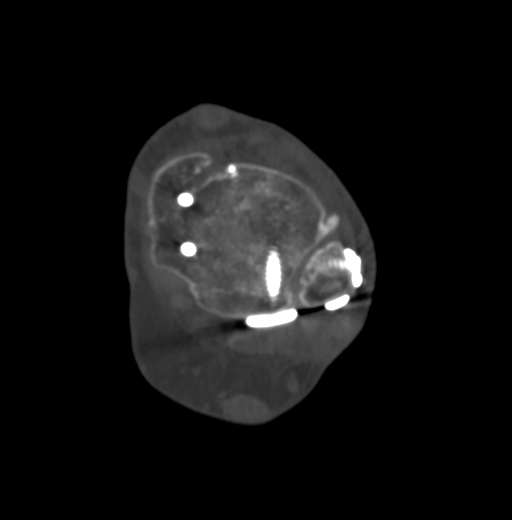
[im 134/174  bone]
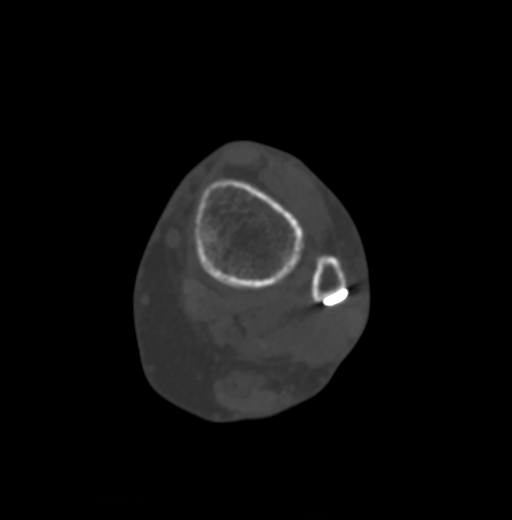
[im 160/174  bone]
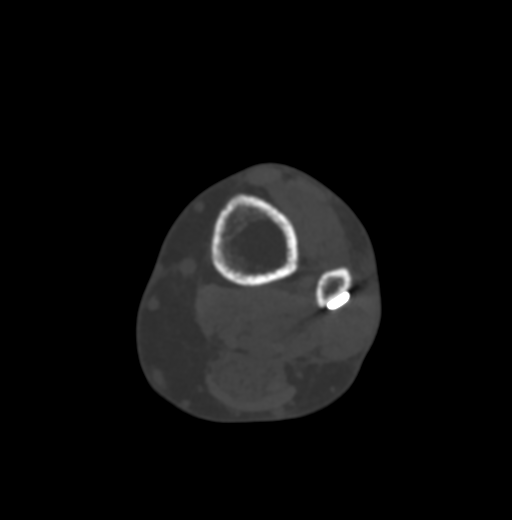

[Series 7: cor bone · coronal · 0.21mm/px · 1 of 134 slices shown]
[im 67/134  bone]
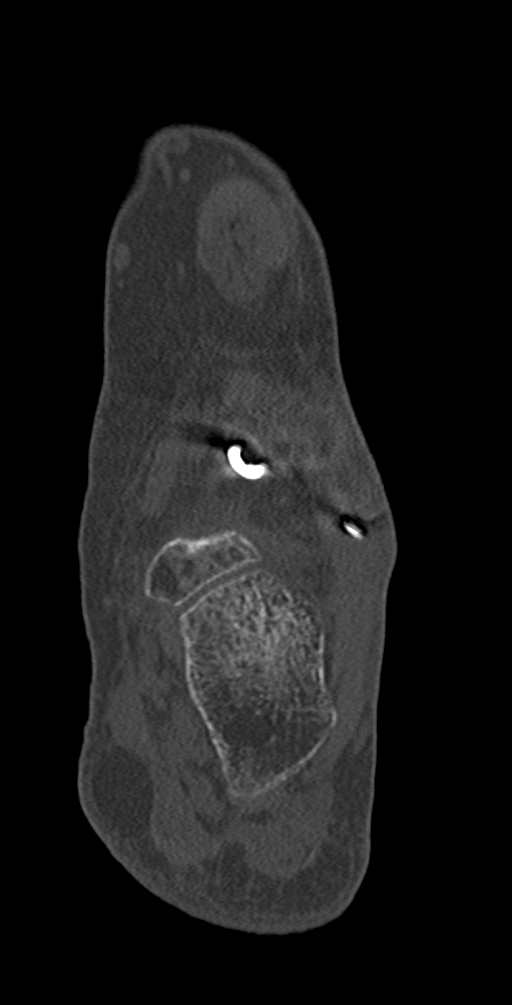

[Series 10: sag st · sagittal · 0.29mm/px · 5 of 96 slices shown, 6 images]
[im 32/96  bone]
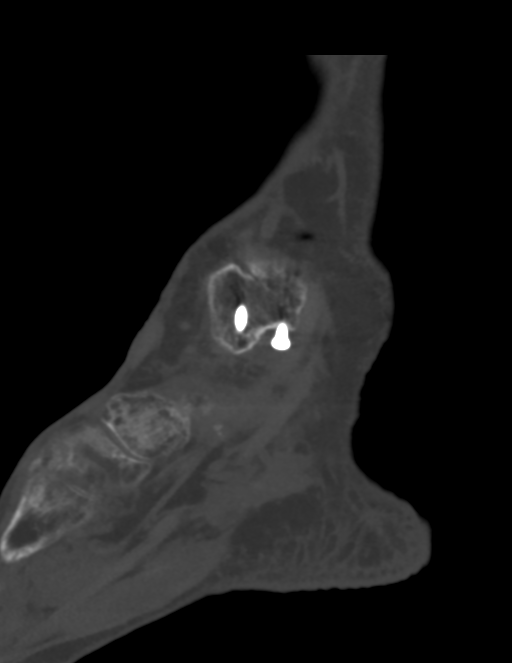
[im 40/96  bone]
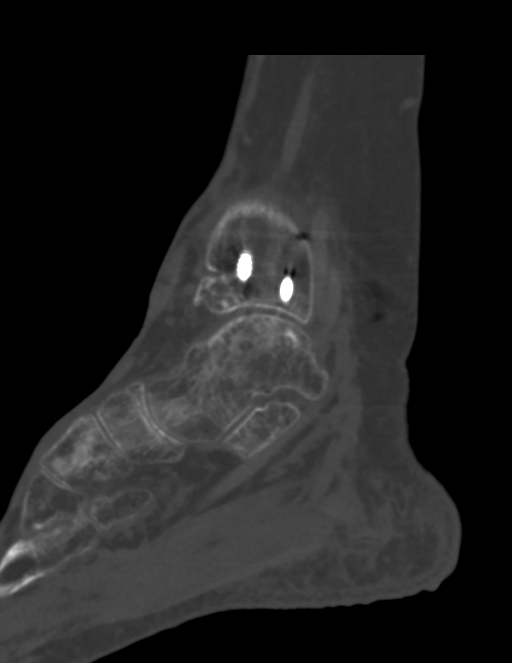
[im 48/96  soft-tissue]
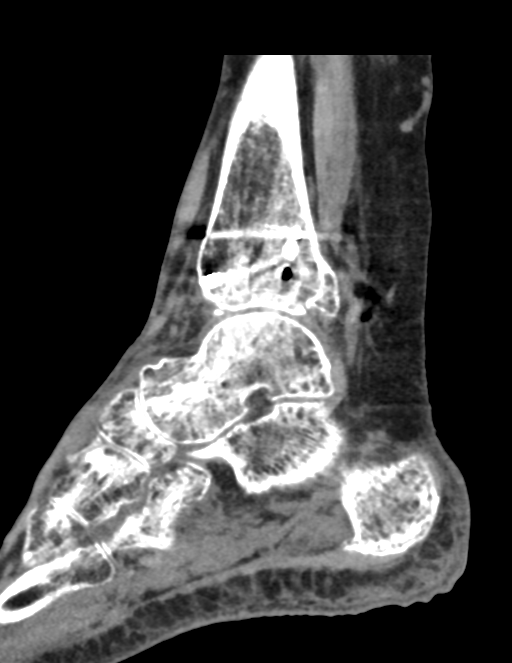
[im 48/96  bone]
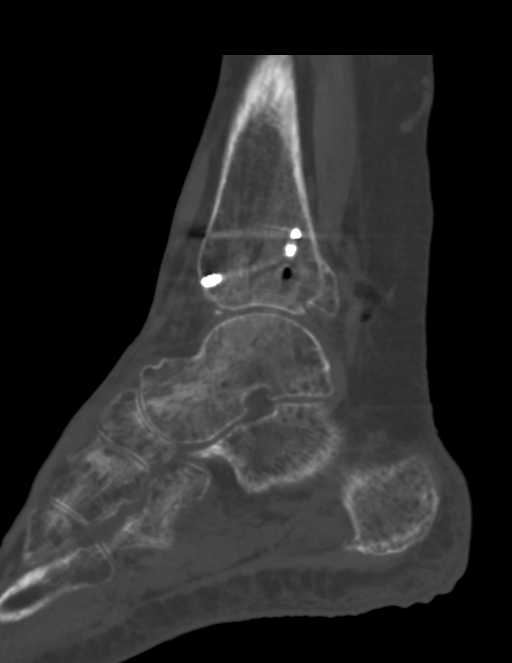
[im 56/96  bone]
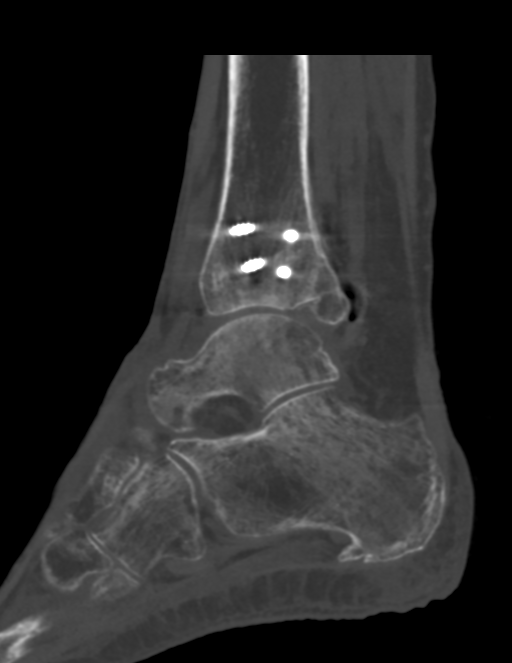
[im 64/96  bone]
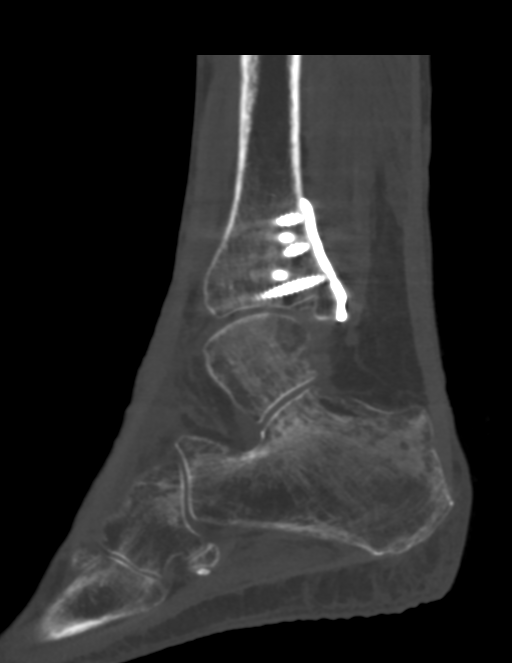

[13 of 33 positions shown; findings below may reference images not displayed]

FINDINGS: Bones/Joint/Cartilage

Severe osteopenia.

Healed medial malleolar fracture transfixed with 2 cannulated
screws. Healed lateral malleolar fracture transfixed with a lateral
sideplate and interlocking screws. Two syndesmotic screws are
present without hardware failure or complication. Healed posterior
distal tibial fracture.

No acute fracture or dislocation. Normal alignment. Small joint
effusion. Small plantar calcaneal spur. Enthesopathic changes of the
Achilles tendon insertion.

Posttraumatic articular surface irregularity involving the posterior
tibial plafond and.

Ligaments

Ligaments are suboptimally evaluated by CT.

Muscles and Tendons
Muscles are normal. No muscle atrophy. No intramuscular fluid
collection or hematoma. Flexor, extensor, peroneal and Achilles
tendons are intact.

Soft tissue
No fluid collection or hematoma.  No soft tissue mass.
IMPRESSION: 1. Healed trimalleolar fractures without hardware failure or
complication.
2. Posttraumatic articular surface irregularity involving the
posterior tibial plafond.

## 2023-03-02 ENCOUNTER — Emergency Department (HOSPITAL_BASED_OUTPATIENT_CLINIC_OR_DEPARTMENT_OTHER)
Admission: EM | Admit: 2023-03-02 | Discharge: 2023-03-03 | Disposition: A | Discharge status: Home / Self Care | Payer: Medicare Other | Attending: Emergency Medicine | Admitting: Emergency Medicine

## 2023-03-02 ENCOUNTER — Encounter (HOSPITAL_BASED_OUTPATIENT_CLINIC_OR_DEPARTMENT_OTHER): Payer: Self-pay | Admitting: Emergency Medicine

## 2023-03-02 ENCOUNTER — Emergency Department (HOSPITAL_BASED_OUTPATIENT_CLINIC_OR_DEPARTMENT_OTHER): Payer: Medicare Other

## 2023-03-02 ENCOUNTER — Other Ambulatory Visit: Payer: Self-pay

## 2023-03-02 DIAGNOSIS — K760 Fatty (change of) liver, not elsewhere classified: Secondary | ICD-10-CM | POA: Diagnosis not present

## 2023-03-02 DIAGNOSIS — J45909 Unspecified asthma, uncomplicated: Secondary | ICD-10-CM | POA: Insufficient documentation

## 2023-03-02 DIAGNOSIS — R109 Unspecified abdominal pain: Secondary | ICD-10-CM | POA: Diagnosis not present

## 2023-03-02 DIAGNOSIS — K30 Functional dyspepsia: Secondary | ICD-10-CM | POA: Diagnosis not present

## 2023-03-02 DIAGNOSIS — R10A0 Flank pain, unspecified side: Secondary | ICD-10-CM

## 2023-03-02 DIAGNOSIS — Z79899 Other long term (current) drug therapy: Secondary | ICD-10-CM | POA: Insufficient documentation

## 2023-03-02 DIAGNOSIS — I10 Essential (primary) hypertension: Secondary | ICD-10-CM | POA: Diagnosis not present

## 2023-03-02 DIAGNOSIS — K5730 Diverticulosis of large intestine without perforation or abscess without bleeding: Secondary | ICD-10-CM | POA: Diagnosis not present

## 2023-03-02 LAB — CBC WITH DIFFERENTIAL/PLATELET
Abs Immature Granulocytes: 0.02 K/uL (ref 0.00–0.07)
Basophils Absolute: 0.1 K/uL (ref 0.0–0.1)
Basophils Relative: 1 %
Eosinophils Absolute: 0.2 K/uL (ref 0.0–0.5)
Eosinophils Relative: 2 %
HCT: 44.4 % (ref 36.0–46.0)
Hemoglobin: 14.9 g/dL (ref 12.0–15.0)
Immature Granulocytes: 0 %
Lymphocytes Relative: 44 %
Lymphs Abs: 4.2 K/uL — ABNORMAL HIGH (ref 0.7–4.0)
MCH: 30 pg (ref 26.0–34.0)
MCHC: 33.6 g/dL (ref 30.0–36.0)
MCV: 89.5 fL (ref 80.0–100.0)
Monocytes Absolute: 0.7 K/uL (ref 0.1–1.0)
Monocytes Relative: 7 %
Neutro Abs: 4.4 K/uL (ref 1.7–7.7)
Neutrophils Relative %: 46 %
Platelets: 257 K/uL (ref 150–400)
RBC: 4.96 MIL/uL (ref 3.87–5.11)
RDW: 12.6 % (ref 11.5–15.5)
WBC: 9.6 K/uL (ref 4.0–10.5)
nRBC: 0 % (ref 0.0–0.2)

## 2023-03-02 LAB — COMPREHENSIVE METABOLIC PANEL
ALT: 19 U/L (ref 0–44)
AST: 26 U/L (ref 15–41)
Albumin: 4.6 g/dL (ref 3.5–5.0)
Alkaline Phosphatase: 77 U/L (ref 38–126)
Anion gap: 12 (ref 5–15)
BUN: 18 mg/dL (ref 8–23)
CO2: 24 mmol/L (ref 22–32)
Calcium: 10.4 mg/dL — ABNORMAL HIGH (ref 8.9–10.3)
Chloride: 102 mmol/L (ref 98–111)
Creatinine, Ser: 1.01 mg/dL — ABNORMAL HIGH (ref 0.44–1.00)
GFR, Estimated: >60 mL/min (ref >60)
Glucose, Bld: 110 mg/dL — ABNORMAL HIGH (ref 70–99)
Potassium: 4 mmol/L (ref 3.5–5.1)
Sodium: 138 mmol/L (ref 135–145)
Total Bilirubin: 0.7 mg/dL (ref 0.3–1.2)
Total Protein: 8 g/dL (ref 6.5–8.1)

## 2023-03-02 LAB — URINALYSIS, ROUTINE W REFLEX MICROSCOPIC
Bilirubin Urine: NEGATIVE
Glucose, UA: NEGATIVE mg/dL
Ketones, ur: NEGATIVE mg/dL
Leukocytes,Ua: NEGATIVE
Nitrite: NEGATIVE
Protein, ur: NEGATIVE mg/dL
Specific Gravity, Urine: 1.015 (ref 1.005–1.030)
pH: 6 (ref 5.0–8.0)

## 2023-03-02 LAB — URINALYSIS, MICROSCOPIC (REFLEX)

## 2023-03-02 LAB — TROPONIN I (HIGH SENSITIVITY): Troponin I (High Sensitivity): 3 ng/L (ref <18)

## 2023-03-02 LAB — LIPASE, BLOOD: Lipase: 56 U/L — ABNORMAL HIGH (ref 11–51)

## 2023-03-02 NOTE — ED Triage Notes (Signed)
Right flank pain that radiates into right side of abdomen started last night. Indigestion and "pressure" in chest started tonight after lying flat. Pain improved after sitting up. Mild nausea, no vomiting.

## 2023-03-03 DIAGNOSIS — R109 Unspecified abdominal pain: Secondary | ICD-10-CM | POA: Diagnosis not present

## 2023-03-03 DIAGNOSIS — K760 Fatty (change of) liver, not elsewhere classified: Secondary | ICD-10-CM | POA: Diagnosis not present

## 2023-03-03 DIAGNOSIS — K5730 Diverticulosis of large intestine without perforation or abscess without bleeding: Secondary | ICD-10-CM | POA: Diagnosis not present

## 2023-03-03 LAB — TROPONIN I (HIGH SENSITIVITY): Troponin I (High Sensitivity): 5 ng/L (ref <18)

## 2023-03-03 NOTE — ED Notes (Signed)
Pt refused any pain medication 

## 2023-03-03 NOTE — ED Provider Notes (Signed)
[place] [facility] Provider Note   CSN: [id number] Arrival date & time: 03/02/23  2134     History  Chief Complaint  Patient presents with   Abdominal Pain   Chest Pain    Kathryn Boyer is a 67 y.o. female.  The history is provided by the patient and the spouse.   Patient history of anxiety, hypertension presents with right flank pain.  Patient reports over the past days she has had right flank pain that radiates to the right abdomen.  She reports nausea without any vomiting.  No dysuria.  No change in her bowel movements.  She also reports after this flank pain started, she started having  chest discomfort with indigestion.  No known history of CAD Past Medical History:  Diagnosis Date   Anxiety    Asthma    GERD (gastroesophageal reflux disease)    Hyperlipidemia    Hypertension    Plantar fasciitis    Thyroid disease     Home Medications Prior to Admission medications   Medication Sig Start Date End Date Taking? Authorizing Provider  ALPRAZolam Prudy Feeler) 0.5 MG tablet Take 1 tablet (0.5 mg total) by mouth 3 (three) times daily as needed for anxiety. 04/23/22   Zola Button, Yvonne R, DO  Camphor-Menthol-Methyl Sal (SALONPAS) 3.08-31-08 % PTCH Apply 5 patches topically daily.    [provider]  cyclobenzaprine (FLEXERIL) 10 MG tablet Take 1 tablet (10 mg total) by mouth 3 (three) times daily as needed for muscle spasms. 01/22/23   Donato Schultz, DO  cyclobenzaprine (FLEXERIL) 5 MG tablet Take 5 mg by mouth every 8 (eight) hours as needed for pain. 09/14/21   [provider]  diclofenac Sodium (VOLTAREN) 1 % GEL Apply 2 g topically daily as needed (pain).    [provider]  diphenhydrAMINE (BENADRYL) 25 MG tablet Take 25 mg by mouth every 6 (six) hours as needed for allergies.    [provider]  estradiol (ESTRACE) 0.1 MG/GM vaginal cream Place 1 Applicatorful vaginally 3 (three) times a week.  12/26/17   Donato Schultz, DO  famotidine-calcium carbonate-magnesium hydroxide (PEPCID COMPLETE) 10-800-165 MG chewable tablet Chew 1 tablet by mouth daily as needed (Heartburn).    [provider]  ibuprofen (ADVIL) 200 MG tablet Take 600 mg by mouth every 6 (six) hours as needed for mild pain or moderate pain.    [provider]  levothyroxine (SYNTHROID) 88 MCG tablet TAKE 1 TABLET(88 MCG) BY MOUTH DAILY 02/20/23   Zola Button, Grayling Congress, DO  Multiple Vitamins-Minerals (MULTIVITAMIN WITH MINERALS) tablet Take 1 tablet by mouth daily. V thrive    [provider]  nitrofurantoin, macrocrystal-monohydrate, (MACROBID) 100 MG capsule Take 1 capsule (100 mg total) by mouth 2 (two) times daily. 12/05/22   Donato Schultz, DO  olmesartan (BENICAR) 40 MG tablet Take 1 tablet (40 mg total) by mouth daily. 11/01/22   Donato Schultz, DO  omeprazole (PRILOSEC) 40 MG capsule Take 40 mg by mouth daily.    [provider]  ondansetron (ZOFRAN-ODT) 4 MG disintegrating tablet Take 1 tablet (4 mg total) by mouth every 8 (eight) hours as needed for nausea or vomiting. 01/08/22   Montez Morita, PA-C  OVER THE COUNTER MEDICATION Apply 1 application. topically daily as needed (Pain). Body Balm    [provider]  pravastatin (PRAVACHOL) 20 MG tablet TAKE 1 TABLET(20 MG) BY MOUTH DAILY 11/01/22   Lowne  Irish Elders, DO  Probiotic Product (PROBIOTIC PO) Take 1 capsule by mouth daily.    [provider]  Romosozumab-aqqg (EVENITY) 105 MG/1. SOSY injection Inject 210 mg into the skin every 30 (thirty) days.    [provider]  Semaglutide-Weight Management (WEGOVY) 0.25 MG/0.5ML SOAJ Inject 0.25 mg into the skin once a week. 01/10/23   Lowne Chase, Yvonne R, DO  VENTOLIN HFA 108 (90 Base) MCG/ACT inhaler Inhale 2 puffs into the lungs every 4 (four) hours as needed for wheezing or shortness of breath. 08/06/22   Olive Bass, FNP  Vitamin  D, Ergocalciferol, (DRISDOL) 1.25 MG (50000 UNIT) CAPS capsule Take 1 capsule (50,000 Units total) by mouth every 7 (seven) days. 11/01/22   Donato Schultz, DO      Allergies    Contrast media [iodinated contrast media], Iopamidol, Liraglutide, Amoxicillin, Belviq xr [lorcaserin hcl er], Saxenda [liraglutide -weight management], and Penicillins    Review of Systems   Review of Systems  Constitutional:  Negative for fever.  Gastrointestinal:  Positive for abdominal pain and nausea.  Genitourinary:  Positive for flank pain. Negative for dysuria.    Physical Exam Updated Vital Signs BP 135/60   Pulse 61   Temp 97.8 F (36.6 C) (Oral)   Resp 17   Ht 1.676 m (5\' 6" )   Wt 80.7 kg   SpO2 97%   BMI 28.73 kg/m  Physical Exam CONSTITUTIONAL: Well developed/well nourished HEAD: Normocephalic/atraumatic EYES: EOMI ENMT: Mucous membranes moist NECK: supple no meningeal signs CV: S1/S2 noted, no murmurs/rubs/gallops noted LUNGS: Lungs are clear to auscultation bilaterally, no apparent distress ABDOMEN: soft, mild RUQ tenderness, no rebound or guarding, bowel sounds noted throughout abdomen No rash noted to the abdomen GU: Right cva tenderness, no rash noted NEURO: Pt is awake/alert/appropriate, moves all extremitiesx4.  No facial droop.   EXTREMITIES: pulses normal/equal, full ROM SKIN: warm, color normal PSYCH: no abnormalities of mood noted, alert and oriented to situation  ED Results / Procedures / Treatments   Labs (all labs ordered are listed, but only abnormal results are displayed) Labs Reviewed  LIPASE, BLOOD - Abnormal; Notable for the following components:      Result Value   Lipase 56 (*)    All other components within normal limits  COMPREHENSIVE METABOLIC PANEL - Abnormal; Notable for the following components:   Glucose, Bld 110 (*)    Creatinine, Ser 1.01 (*)    Calcium 10.4 (*)    All other components within normal limits  URINALYSIS, ROUTINE W REFLEX  MICROSCOPIC - Abnormal; Notable for the following components:   Hgb urine dipstick TRACE (*)    All other components within normal limits  CBC WITH DIFFERENTIAL/PLATELET - Abnormal; Notable for the following components:   Lymphs Abs 4.2 (*)    All other components within normal limits  URINALYSIS, MICROSCOPIC (REFLEX) - Abnormal; Notable for the following components:   Bacteria, UA RARE (*)    All other components within normal limits  TROPONIN I (HIGH SENSITIVITY)  TROPONIN I (HIGH SENSITIVITY)    EKG EKG Interpretation Date/Time:  Sunday March 02 2023 21:41:25 EDT Ventricular Rate:  78 PR Interval:  172 QRS Duration:  86 QT Interval:  399 QTC Calculation: 455 R Axis:   -5  Text Interpretation: Sinus rhythm Low voltage, precordial leads Borderline T abnormalities, anterior leads Confirmed by Zadie Rhine ([id number]) on 03/02/2023 11:34:28 PM  Radiology CT Renal Stone Study  Result Date: 03/03/2023 CLINICAL DATA:  Flank  pain. EXAM: CT ABDOMEN AND PELVIS WITHOUT CONTRAST TECHNIQUE: Multidetector CT imaging of the abdomen and pelvis was performed following the standard protocol without IV contrast. RADIATION DOSE REDUCTION: This exam was performed according to the departmental dose-optimization program which includes automated exposure control, adjustment of the mA and/or kV according to patient size and/or use of iterative reconstruction technique. COMPARISON:  September 06, 2017 FINDINGS: Lower chest: No acute abnormality. Hepatobiliary: There is diffuse fatty infiltration of the liver parenchyma. No focal liver abnormality is seen. No gallstones, gallbladder wall thickening, or biliary dilatation. Pancreas: Unremarkable. No pancreatic ductal dilatation or surrounding inflammatory changes. Spleen: Normal in size without focal abnormality. Adrenals/Urinary Tract: Adrenal glands are unremarkable. Kidneys are normal, without renal calculi, focal lesion, or hydronephrosis. Bladder is unremarkable.  Stomach/Bowel: Stomach is within normal limits. Appendix appears normal. No evidence of bowel wall thickening, distention, or inflammatory changes. Noninflamed diverticula are seen throughout the descending and sigmoid colon. Vascular/Lymphatic: Aortic atherosclerosis. No enlarged abdominal or pelvic lymph nodes. Reproductive: Status post hysterectomy. No adnexal masses. Other: No abdominal wall hernia or abnormality. No abdominopelvic ascites. Musculoskeletal: Degenerative changes are seen at the level of L5-S1. IMPRESSION: 1. Colonic diverticulosis. 2. Hepatic steatosis. 3. Evidence of prior hysterectomy. 4. Aortic atherosclerosis. Aortic Atherosclerosis (ICD10-I70.0). Electronically Signed   By: Aram Candela M.D.   On: 03/03/2023 00:14    Procedures Procedures    Medications Ordered in ED Medications - No data to display  ED Course/ Medical Decision Making/ A&P Clinical Course as of 03/03/23 0117  Mon Mar 03, 2023  0117 Overall workup was unremarkable.  Patient is in no acute distress.  She is feeling improved. Patient feels comfortable for discharge home.  She was made aware of hepatic steatosis and need for follow-up. [DW]    Clinical Course User Index [DW] Zadie Rhine, MD                             Medical Decision Making Amount and/or Complexity of Data Reviewed Labs: ordered. Radiology: ordered.   This patient presents to the ED for concern of abdominal pain, this involves an extensive number of treatment options, and is a complaint that carries with it a high risk of complications and morbidity.  The differential diagnosis includes but is not limited to cholecystitis, cholelithiasis, pancreatitis, gastritis, peptic ulcer disease, appendicitis, bowel obstruction, bowel perforation, diverticulitis, AAA, ischemic bowel, acute coronary syndrome    Comorbidities that complicate the patient evaluation: Patient's presentation is complicated by their history of  hypertension   Additional history obtained: Additional history obtained from spouse Records reviewed Primary Care Documents  Lab Tests: I Ordered, and personally interpreted labs.  The pertinent results include: Labs overall unremarkable  Imaging Studies ordered: I ordered imaging studies including CT scan renal   I independently visualized and interpreted imaging which showed no acute findings I agree with the radiologist interpretation    Medicines ordered and prescription drug management: Patient declines pain medicine  Test Considered: Patient reported indigestion, but EKG is unremarkable, troponin is negative.  Low suspicion for ACS at this time   Reevaluation: After the interventions noted above, I reevaluated the patient and found that they have :improved  Complexity of problems addressed: Patient's presentation is most consistent with  acute presentation with potential threat to life or bodily function  Disposition: After consideration of the diagnostic results and the patient's response to treatment,  I feel that the patent would benefit from discharge   .  Final Clinical Impression(s) / ED Diagnoses Final diagnoses:  Flank pain  Hepatic steatosis  Indigestion    Rx / DC Orders ED Discharge Orders     None         Zadie Rhine, MD 03/03/23 [phone]

## 2023-04-10 ENCOUNTER — Encounter (INDEPENDENT_AMBULATORY_CARE_PROVIDER_SITE_OTHER): Payer: Self-pay

## 2023-04-24 ENCOUNTER — Other Ambulatory Visit: Payer: Self-pay | Admitting: Family Medicine

## 2023-04-24 DIAGNOSIS — E559 Vitamin D deficiency, unspecified: Secondary | ICD-10-CM

## 2023-05-01 LAB — HM MAMMOGRAPHY

## 2023-05-02 ENCOUNTER — Encounter: Payer: Self-pay | Admitting: Family Medicine

## 2023-05-05 ENCOUNTER — Encounter: Payer: Self-pay | Admitting: Family Medicine

## 2023-05-05 ENCOUNTER — Other Ambulatory Visit: Payer: Self-pay | Admitting: Family Medicine

## 2023-05-05 ENCOUNTER — Ambulatory Visit (INDEPENDENT_AMBULATORY_CARE_PROVIDER_SITE_OTHER): Payer: Medicare Other | Admitting: Family Medicine

## 2023-05-05 VITALS — BP 136/90 | HR 63 | Temp 98.2°F | Resp 18 | Ht 66.0 in | Wt 178.6 lb

## 2023-05-05 DIAGNOSIS — Z8249 Family history of ischemic heart disease and other diseases of the circulatory system: Secondary | ICD-10-CM

## 2023-05-05 DIAGNOSIS — K760 Fatty (change of) liver, not elsewhere classified: Secondary | ICD-10-CM

## 2023-05-05 DIAGNOSIS — R1013 Epigastric pain: Secondary | ICD-10-CM

## 2023-05-05 DIAGNOSIS — E663 Overweight: Secondary | ICD-10-CM | POA: Diagnosis not present

## 2023-05-05 DIAGNOSIS — I10 Essential (primary) hypertension: Secondary | ICD-10-CM

## 2023-05-05 DIAGNOSIS — E785 Hyperlipidemia, unspecified: Secondary | ICD-10-CM

## 2023-05-05 DIAGNOSIS — E559 Vitamin D deficiency, unspecified: Secondary | ICD-10-CM

## 2023-05-05 DIAGNOSIS — K649 Unspecified hemorrhoids: Secondary | ICD-10-CM

## 2023-05-05 DIAGNOSIS — S81811A Laceration without foreign body, right lower leg, initial encounter: Secondary | ICD-10-CM | POA: Diagnosis not present

## 2023-05-05 DIAGNOSIS — F4310 Post-traumatic stress disorder, unspecified: Secondary | ICD-10-CM | POA: Diagnosis not present

## 2023-05-05 DIAGNOSIS — Z23 Encounter for immunization: Secondary | ICD-10-CM

## 2023-05-05 DIAGNOSIS — E039 Hypothyroidism, unspecified: Secondary | ICD-10-CM | POA: Diagnosis not present

## 2023-05-05 LAB — CBC WITH DIFFERENTIAL/PLATELET
Basophils Absolute: 0.1 10*3/uL (ref 0.0–0.1)
Basophils Relative: 1 % (ref 0.0–3.0)
Eosinophils Absolute: 0.1 10*3/uL (ref 0.0–0.7)
Eosinophils Relative: 1.9 % (ref 0.0–5.0)
HCT: 45.8 % (ref 36.0–46.0)
Hemoglobin: 14.9 g/dL (ref 12.0–15.0)
Lymphocytes Relative: 40.9 % (ref 12.0–46.0)
Lymphs Abs: 2.4 10*3/uL (ref 0.7–4.0)
MCHC: 32.5 g/dL (ref 30.0–36.0)
MCV: 92.5 fl (ref 78.0–100.0)
Monocytes Absolute: 0.4 10*3/uL (ref 0.1–1.0)
Monocytes Relative: 6.7 % (ref 3.0–12.0)
Neutro Abs: 2.9 10*3/uL (ref 1.4–7.7)
Neutrophils Relative %: 49.5 % (ref 43.0–77.0)
Platelets: 263 10*3/uL (ref 150.0–400.0)
RBC: 4.95 Mil/uL (ref 3.87–5.11)
RDW: 12.9 % (ref 11.5–15.5)
WBC: 5.9 10*3/uL (ref 4.0–10.5)

## 2023-05-05 LAB — COMPREHENSIVE METABOLIC PANEL
ALT: 19 U/L (ref 0–35)
AST: 27 U/L (ref 0–37)
Albumin: 4.6 g/dL (ref 3.5–5.2)
Alkaline Phosphatase: 84 U/L (ref 39–117)
BUN: 13 mg/dL (ref 6–23)
CO2: 27 meq/L (ref 19–32)
Calcium: 9.8 mg/dL (ref 8.4–10.5)
Chloride: 105 meq/L (ref 96–112)
Creatinine, Ser: 1.01 mg/dL (ref 0.40–1.20)
GFR: 57.38 mL/min — ABNORMAL LOW (ref >60.00)
Glucose, Bld: 93 mg/dL (ref 70–99)
Potassium: 4.8 meq/L (ref 3.5–5.1)
Sodium: 140 meq/L (ref 135–145)
Total Bilirubin: 0.8 mg/dL (ref 0.2–1.2)
Total Protein: 7.4 g/dL (ref 6.0–8.3)

## 2023-05-05 LAB — LIPID PANEL
Cholesterol: 170 mg/dL (ref 0–200)
HDL: 57.1 mg/dL (ref >39.00)
LDL Cholesterol: 98 mg/dL (ref 0–99)
NonHDL: 112.63
Total CHOL/HDL Ratio: 3
Triglycerides: 73 mg/dL (ref 0.0–149.0)
VLDL: 14.6 mg/dL (ref 0.0–40.0)

## 2023-05-05 LAB — TSH: TSH: 1.58 u[IU]/mL (ref 0.35–5.50)

## 2023-05-05 LAB — VITAMIN B12: Vitamin B-12: 336 pg/mL (ref 211–911)

## 2023-05-05 LAB — VITAMIN D 25 HYDROXY (VIT D DEFICIENCY, FRACTURES): VITD: 61.03 ng/mL (ref 30.00–100.00)

## 2023-05-05 MED ORDER — VENLAFAXINE HCL ER 37.5 MG PO CP24
37.5000 mg | ORAL_CAPSULE | Freq: Every day | ORAL | 2 refills | Status: AC
Start: 2023-05-05 — End: ?

## 2023-05-05 MED ORDER — WEGOVY 0.25 MG/0.5ML ~~LOC~~ SOAJ: 0.2500 mg | SUBCUTANEOUS | 0 refills | Status: DC

## 2023-05-05 MED ORDER — WEGOVY 0.25 MG/0.5ML ~~LOC~~ SOAJ
0.2500 mg | SUBCUTANEOUS | 0 refills | Status: DC
Start: 2023-05-05 — End: 2023-05-05

## 2023-05-05 NOTE — Assessment & Plan Note (Signed)
Tolerating statin, encouraged heart healthy diet, avoid trans fats, minimize simple carbs and saturated fats. Increase exercise as tolerated 

## 2023-05-05 NOTE — Assessment & Plan Note (Signed)
Check labs  Con't synthroid 

## 2023-05-05 NOTE — Patient Instructions (Signed)
Managing Anxiety, Adult After being diagnosed with anxiety, you may be relieved to know why you have felt or behaved a certain way. You may also feel overwhelmed about the treatment ahead and what it will mean for your life. With care and support, you can manage your anxiety. How to manage lifestyle changes Understanding the difference between stress and anxiety Although stress can play a role in anxiety, it is not the same as anxiety. Stress is your body's reaction to life changes and events, both good and bad. Stress is often caused by something external, such as a deadline, test, or competition. It normally goes away after the event has ended and will last just a few hours. But, stress can be ongoing and can lead to more than just stress. Anxiety is caused by something internal, such as imagining a terrible outcome or worrying that something will go wrong that will greatly upset you. Anxiety often does not go away even after the event is over, and it can become a long-term (chronic) worry. Lowering stress and anxiety Talk with your health care provider or a counselor to learn more about lowering anxiety and stress. They may suggest tension-reduction techniques, such as: Music. Spend time creating or listening to music that you enjoy and that inspires you. Mindfulness-based meditation. Practice being aware of your normal breaths while not trying to control your breathing. It can be done while sitting or walking. Centering prayer. Focus on a word, phrase, or sacred image that means something to you and brings you peace. Deep breathing. Expand your stomach and inhale slowly through your nose. Hold your breath for 3-5 seconds. Then breathe out slowly, letting your stomach muscles relax. Self-talk. Learn to notice and spot thought patterns that lead to anxiety reactions. Change those patterns to thoughts that feel peaceful. Muscle relaxation. Take time to tense muscles and then relax them. Choose a  tension-reduction technique that fits your lifestyle and personality. These techniques take time and practice. Set aside 5-15 minutes a day to do them. Specialized therapists can offer counseling and training in these techniques. The training to help with anxiety may be covered by some insurance plans. Other things you can do to manage stress and anxiety include: Keeping a stress diary. This can help you learn what triggers your reaction and then learn ways to manage your response. Thinking about how you react to certain situations. You may not be able to control everything, but you can control your response. Making time for activities that help you relax and not feeling guilty about spending your time in this way. Doing visual imagery. This involves imagining or creating mental pictures to help you relax. Practicing yoga. Through yoga poses, you can lower tension and relax.  Medicines Medicines for anxiety include: Antidepressant medicines. These are usually prescribed for long-term daily control. Anti-anxiety medicines. These may be added in severe cases, especially when panic attacks occur. When used together, medicines, psychotherapy, and tension-reduction techniques may be the most effective treatment. Relationships Relationships can play a big part in helping you recover. Spend more time connecting with trusted friends and family members. Think about going to couples counseling if you have a partner, taking family education classes, or going to family therapy. Therapy can help you and others better understand your anxiety. How to recognize changes in your anxiety Everyone responds differently to treatment for anxiety. Recovery from anxiety happens when symptoms lessen and stop interfering with your daily life at home or work. This may mean that you   will start to: Have better concentration and focus. Worry will interfere less in your daily thinking. Sleep better. Be less irritable. Have more  energy. Have improved memory. Try to recognize when your condition is getting worse. Contact your provider if your symptoms interfere with home or work and you feel like your condition is not improving. Follow these instructions at home: Activity Exercise. Adults should: Exercise for at least 150 minutes each week. The exercise should increase your heart rate and make you sweat (moderate-intensity exercise). Do strengthening exercises at least twice a week. Get the right amount and quality of sleep. Most adults need 7-9 hours of sleep each night. Lifestyle  Eat a healthy diet that includes plenty of vegetables, fruits, whole grains, low-fat dairy products, and lean protein. Do not eat a lot of foods that are high in fats, added sugars, or salt (sodium). Make choices that simplify your life. Do not use any products that contain nicotine or tobacco. These products include cigarettes, chewing tobacco, and vaping devices, such as e-cigarettes. If you need help quitting, ask your provider. Avoid caffeine, alcohol, and certain over-the-counter cold medicines. These may make you feel worse. Ask your pharmacist which medicines to avoid. General instructions Take over-the-counter and prescription medicines only as told by your provider. Keep all follow-up visits. This is to make sure you are managing your anxiety well or if you need more support. Where to find support You can get help and support from: Self-help groups. Online and community organizations. A trusted spiritual leader. Couples counseling. Family education classes. Family therapy. Where to find more information You may find that joining a support group helps you deal with your anxiety. The following sources can help you find counselors or support groups near you: Mental Health America: mentalhealthamerica.net Anxiety and Depression Association of America (ADAA): adaa.org National Alliance on Mental Illness (NAMI): nami.org Contact  a health care provider if: You have a hard time staying focused or finishing tasks. You spend many hours a day feeling worried about everyday life. You are very tired because you cannot stop worrying. You start to have headaches or often feel tense. You have chronic nausea or diarrhea. Get help right away if: Your heart feels like it is racing. You have shortness of breath. You have thoughts of hurting yourself or others. Get help right away if you feel like you may hurt yourself or others, or have thoughts about taking your own life. Go to your nearest emergency room or: Call 911. Call the National Suicide Prevention Lifeline at 1-800-273-8255 or 988. This is open 24 hours a day. Text the Crisis Text Line at 741741. This information is not intended to replace advice given to you by your health care provider. Make sure you discuss any questions you have with your health care provider. Document Revised: 05/21/2022 Document Reviewed: 12/03/2020 Elsevier Patient Education  2024 Elsevier Inc.  

## 2023-05-05 NOTE — Progress Notes (Signed)
Established Patient Office Visit  Subjective   Patient ID: Kathryn Boyer, female    DOB: 03/19/1956  Age: 67 y.o. MRN: [id number]  Chief Complaint  Patient presents with   Hypertension   Hypothyroidism   Follow-up    HPI Discussed the use of AI scribe software for clinical note transcription with the patient, who gave verbal consent to proceed.  History of Present Illness   The patient, with a history of anxiety, indigestion, weight issues, and bone health concerns, presents for a follow-up visit. The patient recently sustained a cut from a car door and has been applying Neosporin and Nu Skin for wound care. The patient reports difficulty in stopping the bleeding initially but the wound appears to be healing now. The patient is concerned about tetanus and requests a tetanus shot.  The patient also reports a past episode of severe indigestion that led to an ER visit. The indigestion resolved by the time the patient reached the hospital. The patient has a family history of heart disease and is concerned about the potential cardiac implications of the indigestion.  The patient has been experiencing anxiety, particularly after a fall that led to a period of bed rest. The patient reports increased anxiety in the car and is considering starting Effexor, a medication previously used for a short period.  The patient has been struggling with weight issues and has been trying various weight loss methods. The patient has stopped taking Wegovy due to insurance issues and is considering starting Lytgobi. The patient has also had a DEXA scan indicating 40% body fat and is concerned about the implications for liver health.  The patient has a history of bone health issues and has been on Evenity for a year. The patient reports jaw issues from the medication and is considering switching to Prolia. The patient also reports hemorrhoids and is considering surgical removal.      Patient Active Problem List    Diagnosis Date Noted   Dysuria 12/05/2022   High risk medication use 12/05/2022   Bronchitis 08/09/2022   Acute non-recurrent pansinusitis 08/09/2022   Overweight (BMI 25.0-29.9) 04/23/2022   Preventative health care 10/26/2021   Pressure injury of skin of sacral region 08/10/2021   Acute cystitis without hematuria 08/10/2021   Acute blood loss anemia    History of hypertension    Drug induced constipation    Postoperative pain    Critical polytrauma 07/27/2021   Humerus fracture 07/21/2021   Tibial plateau fracture, left 07/21/2021   Nondisplaced fracture of fifth metatarsal bone, right foot, initial encounter for closed fracture 07/21/2021   Nondisplaced fracture of lateral malleolus of right fibula, initial encounter for closed fracture 07/21/2021   Left trimalleolar fracture, closed, initial encounter 07/21/2021   Fall 07/20/2021   Urine frequency 04/29/2019   Chest pain 04/29/2019   Urinary frequency 04/29/2019   Post-menopausal 04/29/2019   Allergic rhinitis 10/13/2018   Mild intermittent asthma with acute exacerbation 10/13/2018   Gastroesophageal reflux disease 10/13/2018   Right hip pain 06/01/2018   Chronic low back pain 05/25/2018   Acute pain of right knee 05/25/2018   Thoracic aorta atherosclerosis (HCC) 05/25/2018   Generalized anxiety disorder 03/22/2017   Hyperlipidemia 03/22/2017   Hypothyroidism 03/22/2017   Osteoporosis 12/20/2016   Encounter for counseling 09/18/2016   Vaginal dryness 02/06/2016   Allergic rhinitis due to pollen 02/01/2016   Acute maxillary sinusitis 02/01/2016   Essential hypertension 02/01/2016   Asthma with acute exacerbation 02/01/2016   Lumbar radiculopathy,  acute 11/01/2015   Osteoporosis, 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   Adjustment disorder with anxiety 02/08/2015   Abnormal weight gain 02/08/2015   Past Medical History:  Diagnosis Date   Anxiety    Asthma    GERD (gastroesophageal reflux disease)    Hyperlipidemia     Hypertension    Plantar fasciitis    Thyroid disease    Past Surgical History:  Procedure Laterality Date   ABDOMINAL HYSTERECTOMY     BREAST SURGERY     HARDWARE REMOVAL Left 01/08/2022   Procedure: HARDWARE REMOVAL;  Surgeon: Myrene Galas, MD;  Location: [facility] OR;  Service: Orthopedics;  Laterality: Left;   NOSE SURGERY     ORIF ANKLE FRACTURE Left 07/23/2021   Procedure: OPEN REDUCTION INTERNAL FIXATION (ORIF) ANKLE FRACTURE;  Surgeon: Myrene Galas, MD;  Location: [facility] OR;  Service: Orthopedics;  Laterality: Left;   REVERSE SHOULDER ARTHROPLASTY Left 07/25/2021   Procedure: LEFT REVERSE SHOULDER ARTHROPLASTY;  Surgeon: Bjorn Pippin, MD;  Location: [facility] OR;  Service: Orthopedics;  Laterality: Left;   Social History   Tobacco Use   Smoking status: Never   Smokeless tobacco: Never  Vaping Use   Vaping status: Never Used  Substance Use Topics   Alcohol use: No   Drug use: No   Social History   Socioeconomic History   Marital status: Married    Spouse name: Not on file   Number of children: Not on file   Years of education: Not on file   Highest education level: Not on file  Occupational History   Not on file  Tobacco Use   Smoking status: Never   Smokeless tobacco: Never  Vaping Use   Vaping status: Never Used  Substance and Sexual Activity   Alcohol use: No   Drug use: No   Sexual activity: Not on file  Other Topics Concern   Not on file  Social History Narrative   Exercise-sometimes   Social Determinants of Health   Financial Resource Strain: Low Risk  (12/02/2022)   Overall Financial Resource Strain (CARDIA)    Difficulty of Paying Living Expenses: Not hard at all  Food Insecurity: No Food Insecurity (12/02/2022)   Hunger Vital Sign    Worried About Running Out of Food in the Last Year: Never true    Ran Out of Food in the Last Year: Never true  Transportation Needs: No Transportation Needs (12/02/2022)   PRAPARE - Administrator, Civil Service  (Medical): No    Lack of Transportation (Non-Medical): No  Physical Activity: Sufficiently Active (12/02/2022)   Exercise Vital Sign    Days of Exercise per Week: 5 days    Minutes of Exercise per Session: 60 min  Stress: No Stress Concern Present (12/02/2022)   [employer] of Occupational Health - Occupational Stress Questionnaire    Feeling of Stress : Not at all  Social Connections: Moderately Integrated (12/02/2022)   Social Connection and Isolation Panel [NHANES]    Frequency of Communication with Friends and Family: More than three times a week    Frequency of Social Gatherings with Friends and Family: Once a week    Attends Religious Services: More than 4 times per year    Active Member of [employer] or Organizations: No    Attends Banker Meetings: Never    Marital Status: Married  Catering manager Violence: Not At Risk (12/02/2022)   Humiliation, Afraid, Rape, and Kick questionnaire    Fear of Current or Ex-Partner: No  Emotionally Abused: No    Physically Abused: No    Sexually Abused: No   Family Status  Relation Name Status   Mother  Deceased   Father  Deceased   Sister  (Not Specified)  No partnership data on file   Family History  Problem Relation Age of Onset   Heart disease Mother    Stroke Mother    Hypertension Mother    Cancer Mother        ovarian   Aneurysm Father    Hypertension Father    Heart disease Father    Kidney disease Father    Heart disease Sister    Allergies  Allergen Reactions   Contrast Media [Iodinated Contrast Media] Hives   Iopamidol Hives    After returning home pt called to inform technologist of rash to face and neck, denies difficulty breathing or swallowing, pt took 2 benedryl  Will need premeds in future  After returning home pt called to inform technologist of rash to face and neck, denies difficulty breathing or swallowing, pt took 2 benedryl, Will need premeds in future   Liraglutide Other (See Comments)     Caused indigestion.   Amoxicillin Other (See Comments)    Headache  Cephalosporin tolerant 07/23/2021  Other Reaction(s): Other (See Comments)   Belviq Xr [Lorcaserin Hcl Er] Itching    Caused her face to turn red.   Saxenda [Liraglutide -Weight Management] Other (See Comments)    Caused indigestion.   Penicillins Rash    Cephalosporin Tolerant 07/23/2021       Review of Systems  Constitutional:  Negative for chills, fever and malaise/fatigue.  HENT:  Negative for congestion and hearing loss.   Eyes:  Negative for discharge.  Respiratory:  Negative for cough, sputum production and shortness of breath.   Cardiovascular:  Negative for chest pain, palpitations and leg swelling.  Gastrointestinal:  Negative for abdominal pain, blood in stool, constipation, diarrhea, heartburn, nausea and vomiting.  Genitourinary:  Negative for dysuria, frequency, hematuria and urgency.  Musculoskeletal:  Negative for back pain, falls and myalgias.  Skin:  Negative for rash.  Neurological:  Negative for dizziness, sensory change, loss of consciousness, weakness and headaches.  Endo/Heme/Allergies:  Negative for environmental allergies. Does not bruise/bleed easily.  Psychiatric/Behavioral:  Negative for depression and suicidal ideas. The patient is not nervous/anxious and does not have insomnia.       Objective:     BP (!) 136/90 (BP Location: Left Arm, Patient Position: Sitting, Cuff Size: Normal)   Pulse 63   Temp 98.2 F (36.8 C) (Oral)   Resp 18   Ht 5\' 6"  (1.676 m)   Wt 178 lb 9.6 oz (81 kg)   SpO2 97%   BMI 28.83 kg/m  BP Readings from Last 3 Encounters:  05/05/23 (!) 136/90  03/03/23 135/60  12/18/22 135/73   Wt Readings from Last 3 Encounters:  05/05/23 178 lb 9.6 oz (81 kg)  03/02/23 178 lb (80.7 kg)  12/18/22 180 lb (81.6 kg)   SpO2 Readings from Last 3 Encounters:  05/05/23 97%  03/03/23 97%  12/18/22 100%      Physical Exam Vitals and nursing note reviewed.   Constitutional:      General: She is not in acute distress.    Appearance: Normal appearance. She is well-developed.  HENT:     Head: Normocephalic and atraumatic.  Eyes:     General: No scleral icterus.       Right eye: No discharge.  Left eye: No discharge.  Cardiovascular:     Rate and Rhythm: Normal rate and regular rhythm.     Heart sounds: No murmur heard. Pulmonary:     Effort: Pulmonary effort is normal. No respiratory distress.     Breath sounds: Normal breath sounds.  Musculoskeletal:        General: Normal range of motion.     Cervical back: Normal range of motion and neck supple.     Right lower leg: No edema.     Left lower leg: No edema.  Skin:    General: Skin is warm and dry.  Neurological:     Mental Status: She is alert and oriented to person, place, and time.  Psychiatric:        Mood and Affect: Mood normal.        Behavior: Behavior normal.        Thought Content: Thought content normal.        Judgment: Judgment normal.      Results for orders placed or performed in visit on 05/05/23  CBC with Differential/Platelet  Result Value Ref Range   WBC 5.9 4.0 - 10.5 K/uL   RBC 4.95 3.87 - 5.11 Mil/uL   Hemoglobin 14.9 12.0 - 15.0 g/dL   HCT 16.1 09.6 - 04.5 %   MCV 92.5 78.0 - 100.0 fl   MCHC 32.5 30.0 - 36.0 g/dL   RDW 40.9 81.1 - 91.4 %   Platelets 263.0 150.0 - 400.0 K/uL   Neutrophils Relative % 49.5 43.0 - 77.0 %   Lymphocytes Relative 40.9 12.0 - 46.0 %   Monocytes Relative 6.7 3.0 - 12.0 %   Eosinophils Relative 1.9 0.0 - 5.0 %   Basophils Relative 1.0 0.0 - 3.0 %   Neutro Abs 2.9 1.4 - 7.7 K/uL   Lymphs Abs 2.4 0.7 - 4.0 K/uL   Monocytes Absolute 0.4 0.1 - 1.0 K/uL   Eosinophils Absolute 0.1 0.0 - 0.7 K/uL   Basophils Absolute 0.1 0.0 - 0.1 K/uL  Comprehensive metabolic panel  Result Value Ref Range   Sodium 140 135 - 145 mEq/L   Potassium 4.8 3.5 - 5.1 mEq/L   Chloride 105 96 - 112 mEq/L   CO2 27 19 - 32 mEq/L   Glucose, Bld  93 70 - 99 mg/dL   BUN 13 6 - 23 mg/dL   Creatinine, Ser 7.82 0.40 - 1.20 mg/dL   Total Bilirubin 0.8 0.2 - 1.2 mg/dL   Alkaline Phosphatase 84 39 - 117 U/L   AST 27 0 - 37 U/L   ALT 19 0 - 35 U/L   Total Protein 7.4 6.0 - 8.3 g/dL   Albumin 4.6 3.5 - 5.2 g/dL   GFR 95.62 (L) >13.08 mL/min   Calcium 9.8 8.4 - 10.5 mg/dL  Lipid panel  Result Value Ref Range   Cholesterol 170 0 - 200 mg/dL   Triglycerides 65.7 0.0 - 149.0 mg/dL   HDL 84.69 >62.95 mg/dL   VLDL 28.4 0.0 - 13.2 mg/dL   LDL Cholesterol 98 0 - 99 mg/dL   Total CHOL/HDL Ratio 3    NonHDL 112.63   TSH  Result Value Ref Range   TSH 1.58 0.35 - 5.50 uIU/mL  VITAMIN D 25 Hydroxy (Vit-D Deficiency, Fractures)  Result Value Ref Range   VITD 61.03 30.00 - 100.00 ng/mL  Vitamin B12  Result Value Ref Range   Vitamin B-12 336 211 - 911 pg/mL    Last CBC Lab  Results  Component Value Date   WBC 5.9 05/05/2023   HGB 14.9 05/05/2023   HCT 45.8 05/05/2023   MCV 92.5 05/05/2023   MCH 30.0 03/02/2023   RDW 12.9 05/05/2023   PLT 263.0 05/05/2023   Last metabolic panel Lab Results  Component Value Date   GLUCOSE 93 05/05/2023   NA 140 05/05/2023   K 4.8 05/05/2023   CL 105 05/05/2023   CO2 27 05/05/2023   BUN 13 05/05/2023   CREATININE 1.01 05/05/2023   GFRNONAA >60 03/02/2023   CALCIUM 9.8 05/05/2023   PROT 7.4 05/05/2023   ALBUMIN 4.6 05/05/2023   BILITOT 0.8 05/05/2023   ALKPHOS 84 05/05/2023   AST 27 05/05/2023   ALT 19 05/05/2023   ANIONGAP 12 03/02/2023   Last lipids Lab Results  Component Value Date   CHOL 170 05/05/2023   HDL 57.10 05/05/2023   LDLCALC 98 05/05/2023   TRIG 73.0 05/05/2023   CHOLHDL 3 05/05/2023   Last hemoglobin A1c Lab Results  Component Value Date   HGBA1C 5.9 04/25/2022   Last thyroid functions Lab Results  Component Value Date   TSH 1.58 05/05/2023   T4TOTAL 8.8 04/25/2022   Last vitamin D Lab Results  Component Value Date   VD25OH 61.03 05/05/2023   Last  vitamin B12 and Folate Lab Results  Component Value Date   VITAMINB12 336 05/05/2023      The 10-year ASCVD risk score (Arnett DK, et al., 2019) is: 10.7%    Assessment & Plan:   Problem List Items Addressed This Visit       Unprioritized   Overweight (BMI 25.0-29.9)    Pt wants to take wegovy--- she will pay out of pocket       Relevant Orders   TSH (Completed)   Vitamin B12 (Completed)   Hypothyroidism    Check labs Con't synthroid       Relevant Orders   TSH (Completed)   Hyperlipidemia    Tolerating statin, encouraged heart healthy diet, avoid trans fats, minimize simple carbs and saturated fats. Increase exercise as tolerated       Relevant Orders   CBC with Differential/Platelet (Completed)   Comprehensive metabolic panel (Completed)   Lipid panel (Completed)   Essential hypertension    Well controlled, no changes to meds. Encouraged heart healthy diet such as the DASH diet and exercise as tolerated.        Other Visit Diagnoses     Laceration of right lower leg, initial encounter    -  Primary   Relevant Orders   Td : Tetanus/diphtheria >7yo Preservative  free (Completed)   Post traumatic stress disorder (PTSD)       Relevant Medications   venlafaxine XR (EFFEXOR XR) 37.5 MG 24 hr capsule   Fatty liver       Relevant Medications   Semaglutide-Weight Management (WEGOVY) 0.25 MG/0.5ML SOAJ   Family history of early CAD       Relevant Medications   Semaglutide-Weight Management (WEGOVY) 0.25 MG/0.5ML SOAJ   Other Relevant Orders   Ambulatory referral to Cardiology   Vitamin D deficiency       Relevant Orders   VITAMIN D 25 Hydroxy (Vit-D Deficiency, Fractures) (Completed)   Dyspepsia       Relevant Orders   Ambulatory referral to Gastroenterology   Hemorrhoids, unspecified hemorrhoid type       Relevant Orders   Ambulatory referral to General Surgery      Assessment and Plan  Wound Care Healing laceration from a recent accident. No  signs of infection. Tetanus status due for update. -Administer tetanus vaccine today.  Chest Pain History of prolonged indigestion episode that resolved by the time of ER visit. Family history of heart disease. Normal EKG. -Referral to cardiology for further evaluation.  Weight Management Patient has been advised to lose weight due to high body fat percentage. Previous use of Wegovy and Conveil, but discontinued due to insurance and side effects respectively. Interest in trying Lytgobi. -Prescribe Lytgobi for weight management.  Anxiety Increased anxiety since a fall accident. Previous positive experience with low-dose Effexor. -Prescribe low-dose Effexor for anxiety management.  Muscle Cramps Frequent calf cramps, possibly related to over-exercising. Improvement with increased magnesium intake and rest. -Advise to continue current management and monitor for changes.  Hemorrhoids History of hemorrhoids, previously advised to have them removed. -Referral to [facility] for evaluation and possible removal.  Osteoporosis History of treatment with Evenity and Prolia. Recent side effects with Prolia. Interest in resuming Prolia. -Order Prolia for osteoporosis management.  Fatty Liver History of fatty liver diagnosis. Patient has been advised to lose weight. -Encourage continued weight loss efforts.  General Health Maintenance -Consider bone density scan to evaluate osteoporosis treatment effectiveness. -Flu shot deferred until after cleaning week.       Return in about 6 months (around 11/02/2023), or if symptoms worsen or fail to improve, for annual exam, fasting.    Donato Schultz, DO

## 2023-05-05 NOTE — Assessment & Plan Note (Signed)
Well controlled, no changes to meds. Encouraged heart healthy diet such as the DASH diet and exercise as tolerated.  °

## 2023-05-05 NOTE — Assessment & Plan Note (Signed)
Pt wants to take wegovy--- she will pay out of pocket

## 2023-05-07 ENCOUNTER — Other Ambulatory Visit (HOSPITAL_COMMUNITY): Payer: Self-pay

## 2023-05-07 NOTE — Telephone Encounter (Signed)
Per test claim, weight loss medications are not covered by pt's plan, no PA submitted at this time. Please sign off on rx in this encounter as PA team is unable to resolve RX requests. Thank you

## 2023-05-19 ENCOUNTER — Other Ambulatory Visit: Payer: Self-pay | Admitting: Family Medicine

## 2023-05-19 DIAGNOSIS — E785 Hyperlipidemia, unspecified: Secondary | ICD-10-CM

## 2023-05-27 ENCOUNTER — Other Ambulatory Visit: Payer: Self-pay | Admitting: Family Medicine

## 2023-05-27 DIAGNOSIS — F4310 Post-traumatic stress disorder, unspecified: Secondary | ICD-10-CM

## 2023-06-20 DIAGNOSIS — M722 Plantar fascial fibromatosis: Secondary | ICD-10-CM | POA: Insufficient documentation

## 2023-06-20 DIAGNOSIS — J45909 Unspecified asthma, uncomplicated: Secondary | ICD-10-CM | POA: Insufficient documentation

## 2023-06-20 DIAGNOSIS — I10 Essential (primary) hypertension: Secondary | ICD-10-CM | POA: Insufficient documentation

## 2023-06-20 DIAGNOSIS — E079 Disorder of thyroid, unspecified: Secondary | ICD-10-CM | POA: Insufficient documentation

## 2023-06-20 DIAGNOSIS — F419 Anxiety disorder, unspecified: Secondary | ICD-10-CM | POA: Insufficient documentation

## 2023-06-23 ENCOUNTER — Ambulatory Visit: Payer: Medicare Other

## 2023-06-23 VITALS — BP 114/70 | HR 72 | Ht 66.0 in | Wt 170.0 lb

## 2023-06-23 DIAGNOSIS — R0789 Other chest pain: Secondary | ICD-10-CM | POA: Insufficient documentation

## 2023-06-23 DIAGNOSIS — I340 Nonrheumatic mitral (valve) insufficiency: Secondary | ICD-10-CM | POA: Diagnosis present

## 2023-06-23 DIAGNOSIS — I10 Essential (primary) hypertension: Secondary | ICD-10-CM | POA: Diagnosis not present

## 2023-06-23 DIAGNOSIS — E782 Mixed hyperlipidemia: Secondary | ICD-10-CM | POA: Insufficient documentation

## 2023-06-23 HISTORY — DX: Other chest pain: R07.89

## 2023-06-23 HISTORY — DX: Nonrheumatic mitral (valve) insufficiency: I34.0

## 2023-06-23 NOTE — Assessment & Plan Note (Signed)
Well-controlled at this time. Continue olmesartan 40 mg once daily

## 2023-06-23 NOTE — Progress Notes (Signed)
Cardiology Consultation:    Date:  06/23/2023   ID:  Kathryn Boyer, DOB 06/29/1956, MRN [id number]  PCP:  Kathryn Boyer, Kathryn Congress, DO  Cardiologist:  Kathryn Corporal Bray Vickerman, MD   Referring MD: Kathryn Boyer, Kathryn Boyer, *   No chief complaint on file. Chest discomfort   ASSESSMENT AND PLAN:   67 year old woman with history of mild mitral regurgitation, hypertension, hyperlipidemia, hypothyroidism, anxiety, obesity, remote smoking history for short duration, prior workup for CAD with CT coronary angiogram in December 2019 unremarkable with calcium score 0 and CAD RADS 0 now presenting with atypical chest discomfort symptoms which appear more suggestive of gastritis and reflux.   Problem List Items Addressed This Visit     Essential hypertension - Primary    Well-controlled at this time. Continue olmesartan 40 mg once daily      Relevant Orders   EKG 12-Lead (Completed)   ECHOCARDIOGRAM COMPLETE   Hyperlipidemia    Recent lipid panel from September 2024 at goal. Continue with heart healthy diet, DASH diet. Continue with pravastatin 20 mg once daily. She is inquiring about discontinuing statins in future. Since she does not have any ongoing side effects with statins encouraged her to continue the same.  If she strongly feels to quit statins, Given no significant atherosclerosis disease, if she is able to lose weight, may consider this while closely monitoring lipid levels trend.      Mitral regurgitation, mild on last echocardiogram October 2019    Repeat transthoracic echocardiogram for interval assessment. Reviewed the findings with her extensively.      Relevant Orders   ECHOCARDIOGRAM COMPLETE   Chest discomfort, appears noncardiac with epigastric discomfort    Appears noncardiac. She does not have any significant functional limitations or symptoms with the chest discomfort on exertion or dyspnea at exertion or at rest.  No orthopnea.  Has good baseline functional status  only limited due to musculoskeletal issues pertaining to her hip and knee.  Her last cardiac workup with CT coronary imaging December 2019 was unremarkable.  At this time no further CAD workup is being recommended. However if she does notice any change in her functional status associated with symptoms suggestive of heart disease with chest pain or shortness of breath with exertion, we can consider further evaluation with a repeat CT coronary angiogram.        Return to clinic in 6 months to follow-up on hyperlipidemia and mitral valve disease.   History of Present Illness:    Pa Kathryn Boyer is a 67 y.o. female who is being seen today for the evaluation of chest discomfort at the request of Kathryn Boyer, Kathryn Boyer, *.   For further evaluation last visit with cardiology November 2019 with Dr. Jacques Boyer for palpitations and dyspnea on exertion  Has history of mild mitral regurgitation, hypertension, hyperlipidemia, hypothyroidism disease, anxiety, obesity, fatty liver, remote history of smoking many years ago, CAD workup with calcium score 0 and CAD RADS 0 study on angiogram December 2019 Sister with history of heart attack in her 53s.  Lives at home with her husband.  They are in the process of moving out of their current house and has been busy with organizing things.  Also plans to travel between [place] and [place] to be closer to her son.  Functional status currently is not significantly limited and she is able to go up and down 2-3 flights of stairs limited due to knee pain and hip pain but otherwise no shortness of  breath or chest pain.  She has had atypical symptoms of abdominal discomfort and may and July.  Has had workup, with CT scan unremarkable other than fatty liver findings.  Lipase levels were mildly elevated.  With ongoing symptoms of atypical discomfort she was referred for further cardiac evaluation.  Denies any chest pain or shortness of breath on exertion.  Denies  orthopnea, paroxysmal nocturnal dyspnea.  Denies any palpitations. She has lost weight with regular exercise until recent fall and injury on her left side resulting in shoulder injury and left lower extremity injury requiring surgeries.  She has done well with recovery however does have right knee pain from meniscal injury which is being conservatively managed.  She also reports right hip pain.  No claudication symptoms.  CT coronary angiogram with calcium score done in December 2019 noted a calcium score of 0 and CAD RADS 0 study  EKG in the clinic today shows sinus rhythm heart rate 72/min, PR interval 156 ms, QRS duration 74 ms, normal axis, QTc 446 ms normal.  In comparison to prior EKG from 03/02/2023 no significant change.  Past Medical History:  Diagnosis Date   Abnormal weight gain 02/08/2015   Acute blood loss anemia    Acute cystitis without hematuria 08/10/2021   Acute maxillary sinusitis 02/01/2016   Acute non-recurrent pansinusitis 08/09/2022   Acute pain of right knee 05/25/2018   Acute recurrent ethmoidal sinusitis 11/29/2020   Last Assessment & Plan:    Sinus headaches.   She is having headaches in the face at least twice a week.  Denies colored rhinorrhea.  Some left-sided nasal congestion.  Some postnasal drainage throat awareness.   EXAM shows no intranasal polyps or purulence with a reasonable airway.  Oral cavity oropharynx is unremarkable.   PLAN: Discussed this is most likely migraine and reflux induced irrit   Adjustment disorder with anxiety 02/08/2015   Allergic rhinitis 10/13/2018   Allergic rhinitis due to pollen 02/01/2016   Anxiety    Asthma    Asthma with acute exacerbation 02/01/2016   Bronchitis 08/09/2022   Chest pain 04/29/2019   Chronic low back pain 05/25/2018   Critical polytrauma 07/27/2021   Drug induced constipation    Dysuria 12/05/2022   Encounter for counseling 09/18/2016   Essential hypertension 02/01/2016   Fall 07/20/2021    Gastroesophageal reflux disease 10/13/2018   Generalized anxiety disorder 03/22/2017   High risk medication use 12/05/2022   History of hypertension    Humerus fracture 07/21/2021   Hyperlipidemia    Hypertension    Hypothyroidism 03/22/2017   Left trimalleolar fracture, closed, initial encounter 07/21/2021   Lumbar radiculopathy, acute 11/01/2015   Mild intermittent asthma with acute exacerbation 10/13/2018   Nondisplaced fracture of fifth metatarsal bone, right foot, initial encounter for closed fracture 07/21/2021   Nondisplaced fracture of lateral malleolus of right fibula, initial encounter for closed fracture 07/21/2021   Osteoporosis 12/20/2016   Osteoporosis, 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   Overweight (BMI 25.0-29.9) 04/23/2022   Plantar fasciitis    Post-menopausal 04/29/2019   Post-nasal drainage 08/28/2022   Postoperative pain    Pressure injury of skin of sacral region 08/10/2021   Preventative health care 10/26/2021   Right hip pain 06/01/2018   Thoracic aorta atherosclerosis (HCC) 05/25/2018   Thyroid disease    Tibial plateau fracture, left 07/21/2021   Urinary frequency 04/29/2019   Urine frequency 04/29/2019   Vaginal dryness 02/06/2016    Past Surgical History:  Procedure Laterality Date   ABDOMINAL HYSTERECTOMY  BREAST SURGERY     HARDWARE REMOVAL Left 01/08/2022   Procedure: HARDWARE REMOVAL;  Surgeon: Myrene Galas, MD;  Location: [facility] OR;  Service: Orthopedics;  Laterality: Left;   NOSE SURGERY     ORIF ANKLE FRACTURE Left 07/23/2021   Procedure: OPEN REDUCTION INTERNAL FIXATION (ORIF) ANKLE FRACTURE;  Surgeon: Myrene Galas, MD;  Location: [facility] OR;  Service: Orthopedics;  Laterality: Left;   REVERSE SHOULDER ARTHROPLASTY Left 07/25/2021   Procedure: LEFT REVERSE SHOULDER ARTHROPLASTY;  Surgeon: Bjorn Pippin, MD;  Location: [facility] OR;  Service: Orthopedics;  Laterality: Left;    Current Medications: Current Meds  Medication Sig   ALPRAZolam (XANAX) 0.5  MG tablet Take 1 tablet (0.5 mg total) by mouth 3 (three) times daily as needed for anxiety.   Camphor-Menthol-Methyl Sal (SALONPAS) 3.08-31-08 % PTCH Apply 5 patches topically daily.   cyclobenzaprine (FLEXERIL) 10 MG tablet Take 1 tablet (10 mg total) by mouth 3 (three) times daily as needed for muscle spasms.   cyclobenzaprine (FLEXERIL) 5 MG tablet Take 5 mg by mouth every 8 (eight) hours as needed for pain.   diclofenac Sodium (VOLTAREN) 1 % GEL Apply 2 g topically daily as needed (pain).   diphenhydrAMINE (BENADRYL) 25 MG tablet Take 25 mg by mouth every 6 (six) hours as needed for allergies.   estradiol (ESTRACE) 0.1 MG/GM vaginal cream Place 1 Applicatorful vaginally 3 (three) times a week.   famotidine-calcium carbonate-magnesium hydroxide (PEPCID COMPLETE) 10-800-165 MG chewable tablet Chew 1 tablet by mouth daily as needed (Heartburn).   ibuprofen (ADVIL) 200 MG tablet Take 600 mg by mouth every 6 (six) hours as needed for mild pain or moderate pain.   levothyroxine (SYNTHROID) 88 MCG tablet TAKE 1 TABLET(88 MCG) BY MOUTH DAILY   Multiple Vitamins-Minerals (MULTIVITAMIN WITH MINERALS) tablet Take 1 tablet by mouth daily. V thrive   olmesartan (BENICAR) 40 MG tablet Take 1 tablet (40 mg total) by mouth daily.   omeprazole (PRILOSEC) 40 MG capsule Take 40 mg by mouth daily.   ondansetron (ZOFRAN-ODT) 4 MG disintegrating tablet Take 1 tablet (4 mg total) by mouth every 8 (eight) hours as needed for nausea or vomiting.   OVER THE COUNTER MEDICATION Apply 1 application. topically daily as needed (Pain). Body Balm   pravastatin (PRAVACHOL) 20 MG tablet Take 1 tablet (20 mg total) by mouth daily.   Probiotic Product (PROBIOTIC PO) Take 1 capsule by mouth daily.   Romosozumab-aqqg (EVENITY) 105 MG/1. SOSY injection Inject 210 mg into the skin every 30 (thirty) days.   Semaglutide-Weight Management (WEGOVY) 0.25 MG/0.5ML SOAJ Inject 0.25 mg into the skin once a week.   Semaglutide-Weight  Management (WEGOVY) 0.25 MG/0.5ML SOAJ Inject 0.25 mg into the skin once a week.   venlafaxine XR (EFFEXOR-XR) 37.5 MG 24 hr capsule TAKE 1 CAPSULE BY MOUTH DAILY WITH BREAKFAST.   VENTOLIN HFA 108 (90 Base) MCG/ACT inhaler Inhale 2 puffs into the lungs every 4 (four) hours as needed for wheezing or shortness of breath.   Vitamin D, Ergocalciferol, (DRISDOL) 1.25 MG (50000 UNIT) CAPS capsule Take 1 capsule (50,000 Units total) by mouth every 7 (seven) days.     Allergies:   Contrast media [iodinated contrast media], Iopamidol, Liraglutide, Amoxicillin, Belviq xr [lorcaserin hcl er], Saxenda [liraglutide -weight management], and Penicillins   Social History   Socioeconomic History   Marital status: Married    Spouse name: Not on file   Number of children: Not on file   Years of education: Not on file  Highest education level: Not on file  Occupational History   Not on file  Tobacco Use   Smoking status: Never   Smokeless tobacco: Never  Vaping Use   Vaping status: Never Used  Substance and Sexual Activity   Alcohol use: No   Drug use: No   Sexual activity: Not on file  Other Topics Concern   Not on file  Social History Narrative   Exercise-sometimes   Social Determinants of Health   Financial Resource Strain: Low Risk  (12/02/2022)   Overall Financial Resource Strain (CARDIA)    Difficulty of Paying Living Expenses: Not hard at all  Food Insecurity: No Food Insecurity (12/02/2022)   Hunger Vital Sign    Worried About Running Out of Food in the Last Year: Never true    Ran Out of Food in the Last Year: Never true  Transportation Needs: No Transportation Needs (12/02/2022)   PRAPARE - Administrator, Civil Service (Medical): No    Lack of Transportation (Non-Medical): No  Physical Activity: Sufficiently Active (12/02/2022)   Exercise Vital Sign    Days of Exercise per Week: 5 days    Minutes of Exercise per Session: 60 min  Stress: No Stress Concern Present  (12/02/2022)   [employer] of Occupational Health - Occupational Stress Questionnaire    Feeling of Stress : Not at all  Social Connections: Moderately Integrated (12/02/2022)   Social Connection and Isolation Panel [NHANES]    Frequency of Communication with Friends and Family: More than three times a week    Frequency of Social Gatherings with Friends and Family: Once a week    Attends Religious Services: More than 4 times per year    Active Member of [employer] or Organizations: No    Attends Engineer, structural: Never    Marital Status: Married     Family History: The patient's family history includes Aneurysm in her father; Cancer in her mother; Heart disease in her father, mother, and sister; Hypertension in her father and mother; Kidney disease in her father; Stroke in her mother. ROS:   Please see the history of present illness.    All 14 point review of systems negative except as described per history of present illness.  EKGs/Labs/Other Studies Reviewed:    The following studies were reviewed today:   EKG:  EKG Interpretation Date/Time:  Monday June 23 2023 09:18:16 EDT Ventricular Rate:  72 PR Interval:  156 QRS Duration:  74 QT Interval:  408 QTC Calculation: 446 R Axis:   -8  Text Interpretation: Normal sinus rhythm Low voltage QRS Nonspecific T wave abnormality When compared with ECG of 02-Mar-2023 21:41, PREVIOUS ECG IS PRESENT Confirmed by Huntley Dec reddy [phone]) on 06/23/2023 9:43:41 AM    Recent Labs: 05/05/2023: ALT 19; BUN 13; Creatinine, Ser 1.01; Hemoglobin 14.9; Platelets 263.0; Potassium 4.8; Sodium 140; TSH 1.58  Recent Lipid Panel    Component Value Date/Time   CHOL 170 05/05/2023 1029   TRIG 73.0 05/05/2023 1029   HDL 57.10 05/05/2023 1029   CHOLHDL 3 05/05/2023 1029   VLDL 14.6 05/05/2023 1029   LDLCALC 98 05/05/2023 1029   LDLCALC 105 (H) 10/25/2020 0750    Physical Exam:    VS:  BP 114/70   Pulse 72   Ht 5\' 6"  (1.676  m)   Wt 170 lb (77.1 kg)   SpO2 96%   BMI 27.44 kg/m     Wt Readings from Last 3 Encounters:  06/23/23  170 lb (77.1 kg)  05/05/23 178 lb 9.6 oz (81 kg)  03/02/23 178 lb (80.7 kg)     GENERAL:  Well nourished, well developed in no acute distress NECK: No JVD; No carotid bruits CARDIAC: RRR, S1 and S2 present, no murmurs, no rubs, no gallops CHEST:  Clear to auscultation without rales, wheezing or rhonchi  Extremities: No pitting pedal edema. Pulses bilaterally symmetric with radial 2+ and dorsalis pedis 2+ NEUROLOGIC:  Alert and oriented x 3  Medication Adjustments/Labs and Tests Ordered: Current medicines are reviewed at length with the patient today.  Concerns regarding medicines are outlined above.  Orders Placed This Encounter  Procedures   EKG 12-Lead   ECHOCARDIOGRAM COMPLETE   No orders of the defined types were placed in this encounter.   Signed, Cecille Amsterdam, MD, MPH, [facility]. 06/23/2023 10:20 AM    [facility]

## 2023-06-23 NOTE — Assessment & Plan Note (Signed)
Recent lipid panel from September 2024 at goal. Continue with heart healthy diet, DASH diet. Continue with pravastatin 20 mg once daily. She is inquiring about discontinuing statins in future. Since she does not have any ongoing side effects with statins encouraged her to continue the same.  If she strongly feels to quit statins, Given no significant atherosclerosis disease, if she is able to lose weight, may consider this while closely monitoring lipid levels trend.

## 2023-06-23 NOTE — Patient Instructions (Signed)
Medication Instructions:  Your physician recommends that you continue on your current medications as directed. Please refer to the Current Medication list given to you today.  *If you need a refill on your cardiac medications before your next appointment, please call your pharmacy*   Lab Work: None ordered If you have labs (blood work) drawn today and your tests are completely normal, you will receive your results only by: MyChart Message (if you have MyChart) OR A paper copy in the mail If you have any lab test that is abnormal or we need to change your treatment, we will call you to review the results.   Testing/Procedures: Your physician has requested that you have an echocardiogram. Echocardiography is a painless test that uses sound waves to create images of your heart. It provides your doctor with information about the size and shape of your heart and how well your heart's chambers and valves are working. This procedure takes approximately one hour. There are no restrictions for this procedure. Please do NOT wear cologne, perfume, aftershave, or lotions (deodorant is allowed). Please arrive 15 minutes prior to your appointment time.     Follow-Up: At [facility], you and your health needs are our priority.  As part of our continuing mission to provide you with exceptional heart care, we have created designated Provider Care Teams.  These Care Teams include your primary Cardiologist (physician) and Advanced Practice Providers (APPs -  Physician Assistants and Nurse Practitioners) who all work together to provide you with the care you need, when you need it.  We recommend signing up for the patient portal called "MyChart".  Sign up information is provided on this After Visit Summary.  MyChart is used to connect with patients for Virtual Visits (Telemedicine).  Patients are able to view lab/test results, encounter notes, upcoming appointments, etc.  Non-urgent messages can be sent to  your provider as well.   To learn more about what you can do with MyChart, go to [email or web address].    Your next appointment:   6 month(s)  The format for your next appointment:   In Person  Provider:   Vern Claude Madireddy, MD   Other Instructions Echocardiogram An echocardiogram is a test that uses sound waves (ultrasound) to produce images of the heart. Images from an echocardiogram can provide important information about: Heart size and shape. The size and thickness and movement of your heart's walls. Heart muscle function and strength. Heart valve function or if you have stenosis. Stenosis is when the heart valves are too narrow. If blood is flowing backward through the heart valves (regurgitation). A tumor or infectious growth around the heart valves. Areas of heart muscle that are not working well because of poor blood flow or injury from a heart attack. Aneurysm detection. An aneurysm is a weak or damaged part of an artery wall. The wall bulges out from the normal force of blood pumping through the body. Tell a health care provider about: Any allergies you have. All medicines you are taking, including vitamins, herbs, eye drops, creams, and over-the-counter medicines. Any blood disorders you have. Any surgeries you have had. Any medical conditions you have. Whether you are pregnant or may be pregnant. What are the risks? Generally, this is a safe test. However, problems may occur, including an allergic reaction to dye (contrast) that may be used during the test. What happens before the test? No specific preparation is needed. You may eat and drink normally. What happens during the test? You will  take off your clothes from the waist up and put on a hospital gown. Electrodes or electrocardiogram (ECG)patches may be placed on your chest. The electrodes or patches are then connected to a device that monitors your heart rate and rhythm. You will lie down on a table for  an ultrasound exam. A gel will be applied to your chest to help sound waves pass through your skin. A handheld device, called a transducer, will be pressed against your chest and moved over your heart. The transducer produces sound waves that travel to your heart and bounce back (or "echo" back) to the transducer. These sound waves will be captured in real-time and changed into images of your heart that can be viewed on a video monitor. The images will be recorded on a computer and reviewed by your health care provider. You may be asked to change positions or hold your breath for a short time. This makes it easier to get different views or better views of your heart. In some cases, you may receive contrast through an IV in one of your veins. This can improve the quality of the pictures from your heart. The procedure may vary among health care providers and hospitals.   What can I expect after the test? You may return to your normal, everyday life, including diet, activities, and medicines, unless your health care provider tells you not to do that. Follow these instructions at home: It is up to you to get the results of your test. Ask your health care provider, or the department that is doing the test, when your results will be ready. Keep all follow-up visits. This is important. Summary An echocardiogram is a test that uses sound waves (ultrasound) to produce images of the heart. Images from an echocardiogram can provide important information about the size and shape of your heart, heart muscle function, heart valve function, and other possible heart problems. You do not need to do anything to prepare before this test. You may eat and drink normally. After the echocardiogram is completed, you may return to your normal, everyday life, unless your health care provider tells you not to do that. This information is not intended to replace advice given to you by your health care provider. Make sure you  discuss any questions you have with your health care provider. Document Revised: 04/04/2020 Document Reviewed: 04/04/2020 Elsevier Patient Education  2021 Elsevier Inc.   Important Information About Sugar

## 2023-06-23 NOTE — Assessment & Plan Note (Signed)
Repeat transthoracic echocardiogram for interval assessment. Reviewed the findings with her extensively.

## 2023-06-23 NOTE — Assessment & Plan Note (Signed)
Appears noncardiac. She does not have any significant functional limitations or symptoms with the chest discomfort on exertion or dyspnea at exertion or at rest.  No orthopnea.  Has good baseline functional status only limited due to musculoskeletal issues pertaining to her hip and knee.  Her last cardiac workup with CT coronary imaging December 2019 was unremarkable.  At this time no further CAD workup is being recommended. However if she does notice any change in her functional status associated with symptoms suggestive of heart disease with chest pain or shortness of breath with exertion, we can consider further evaluation with a repeat CT coronary angiogram.

## 2023-07-07 ENCOUNTER — Other Ambulatory Visit: Payer: Self-pay | Admitting: Family Medicine

## 2023-07-07 DIAGNOSIS — E559 Vitamin D deficiency, unspecified: Secondary | ICD-10-CM

## 2023-07-08 ENCOUNTER — Other Ambulatory Visit: Payer: Self-pay

## 2023-07-08 ENCOUNTER — Telehealth: Payer: Self-pay | Admitting: Family Medicine

## 2023-07-08 ENCOUNTER — Other Ambulatory Visit: Payer: Self-pay | Admitting: Family Medicine

## 2023-07-08 DIAGNOSIS — E559 Vitamin D deficiency, unspecified: Secondary | ICD-10-CM

## 2023-07-08 DIAGNOSIS — K644 Residual hemorrhoidal skin tags: Secondary | ICD-10-CM | POA: Diagnosis not present

## 2023-07-08 DIAGNOSIS — R300 Dysuria: Secondary | ICD-10-CM

## 2023-07-08 DIAGNOSIS — F419 Anxiety disorder, unspecified: Secondary | ICD-10-CM

## 2023-07-08 HISTORY — DX: Residual hemorrhoidal skin tags: K64.4

## 2023-07-08 MED ORDER — ALPRAZOLAM 0.5 MG PO TABS: 0.5000 mg | ORAL_TABLET | Freq: Three times a day (TID) | ORAL | 0 refills | Status: DC | PRN

## 2023-07-08 MED ORDER — VITAMIN D (ERGOCALCIFEROL) 1.25 MG (50000 UNIT) PO CAPS
50000.0000 [IU] | ORAL_CAPSULE | ORAL | 1 refills | Status: DC
Start: 2023-07-08 — End: 2023-12-15

## 2023-07-08 NOTE — Telephone Encounter (Signed)
Prescription Request  07/08/2023  Is this a "Controlled Substance" medicine? Yes  LOV: 05/05/2023  What is the name of the medication or equipment?  Vitamin D, Ergocalciferol, (DRISDOL) 1.25 MG (50000 UNIT) CAPS capsule  ALPRAZolam (XANAX) 0.5 MG tablet   Have you contacted your pharmacy to request a refill? No   Which pharmacy would you like this sent to?  [id number] [place], [place] - [address] [place] [address] Phone: [phone] Fax: [phone]    Patient notified that their request is being sent to the clinical staff for review and that they should receive a response within 2 business days.   Please advise at [facility] [phone]

## 2023-07-08 NOTE — Telephone Encounter (Signed)
Pt called and requested to receive a specimen cup for urine culture. She stated she doesn't want a lab appointment. Please call and advise if patient can pick up cup and drop off.

## 2023-07-08 NOTE — Telephone Encounter (Signed)
Requesting: Xanax Contract: 2024 UDS: 2024 Last OV: 05/05/23 Next OV: 11/03/23 Last Refill: 04/23/2022, #30--0 RF Database:   Please advise

## 2023-07-08 NOTE — Telephone Encounter (Signed)
Yes, patient can do this however she does need a lab appointment for the specimen to be sent out.

## 2023-07-08 NOTE — Addendum Note (Signed)
Addended by: Roxanne Gates on: 07/08/2023 11:20 AM   Modules accepted: Orders

## 2023-07-09 ENCOUNTER — Other Ambulatory Visit (INDEPENDENT_AMBULATORY_CARE_PROVIDER_SITE_OTHER): Payer: Medicare Other

## 2023-07-09 DIAGNOSIS — R300 Dysuria: Secondary | ICD-10-CM

## 2023-07-09 LAB — POC URINALSYSI DIPSTICK (AUTOMATED)
Bilirubin, UA: NEGATIVE
Glucose, UA: NEGATIVE
Ketones, UA: NEGATIVE
Nitrite, UA: NEGATIVE
Protein, UA: NEGATIVE
Spec Grav, UA: 1.01 (ref 1.010–1.025)
Urobilinogen, UA: 0.2 U/dL
pH, UA: 5 (ref 5.0–8.0)

## 2023-07-09 NOTE — Progress Notes (Signed)
Specimen drop off

## 2023-07-10 LAB — URINE CULTURE
MICRO NUMBER:: 15725148
Result:: NO GROWTH
SPECIMEN QUALITY:: ADEQUATE

## 2023-07-14 ENCOUNTER — Ambulatory Visit (HOSPITAL_BASED_OUTPATIENT_CLINIC_OR_DEPARTMENT_OTHER)
Admission: RE | Admit: 2023-07-14 | Discharge: 2023-07-14 | Disposition: A | Discharge status: Home / Self Care | Payer: Medicare Other | Source: Ambulatory Visit

## 2023-07-14 DIAGNOSIS — I340 Nonrheumatic mitral (valve) insufficiency: Secondary | ICD-10-CM | POA: Diagnosis present

## 2023-07-15 LAB — ECHOCARDIOGRAM COMPLETE
AR max vel: 1.65 cm2
AV Area VTI: 1.91 cm2
AV Area mean vel: 1.86 cm2
AV Mean grad: 5 mm[Hg]
AV Peak grad: 11.6 mm[Hg]
Ao pk vel: 1.7 m/s
Area-P 1/2: 2.5 cm2
Calc EF: 61.4 %
S' Lateral: 3.4 cm
Single Plane A2C EF: 60.4 %
Single Plane A4C EF: 64.2 %

## 2023-07-18 ENCOUNTER — Other Ambulatory Visit (HOSPITAL_BASED_OUTPATIENT_CLINIC_OR_DEPARTMENT_OTHER): Payer: Self-pay

## 2023-07-18 MED ORDER — INFLUENZA VAC A&B SURF ANT ADJ 0.5 ML IM SUSY
0.5000 mL | PREFILLED_SYRINGE | Freq: Once | INTRAMUSCULAR | 0 refills | Status: AC
  Filled 2023-07-18: qty 0.5, 1d supply, fill #0

## 2023-08-08 DIAGNOSIS — K644 Residual hemorrhoidal skin tags: Secondary | ICD-10-CM | POA: Diagnosis not present

## 2023-08-08 DIAGNOSIS — K5903 Drug induced constipation: Secondary | ICD-10-CM | POA: Diagnosis not present

## 2023-08-11 ENCOUNTER — Other Ambulatory Visit: Payer: Self-pay | Admitting: Family Medicine

## 2023-08-11 DIAGNOSIS — E039 Hypothyroidism, unspecified: Secondary | ICD-10-CM

## 2023-08-11 NOTE — Telephone Encounter (Signed)
Pt called stating that her pharmacy had sent several requests for the refill of this medication and she is leaving out of town tomorrow (12.17.24) and needs to have this refilled today (12.16.24). Routed HP due to Time Sensitivity.

## 2023-08-23 ENCOUNTER — Other Ambulatory Visit: Payer: Self-pay | Admitting: Family Medicine

## 2023-08-23 DIAGNOSIS — E785 Hyperlipidemia, unspecified: Secondary | ICD-10-CM

## 2023-08-25 ENCOUNTER — Telehealth: Payer: Self-pay

## 2023-08-25 NOTE — Telephone Encounter (Signed)
Lvm to schedule

## 2023-08-25 NOTE — Telephone Encounter (Signed)
Pt will need appointment please

## 2023-08-25 NOTE — Telephone Encounter (Signed)
Initial Comment Caller states patient has a sinus infection Translation No Disp. Time Lamount Cohen Time) Disposition Final User 08/24/2023 7:42:51 AM FINAL ATTEMPT MADE - message left Yes Risa Grill RN, Lambert Mody 08/24/2023 7:42:56 AM Send to RN Final Attempt Hinton Dyer, RN, Tracie Final Disposition 08/24/2023 7:42:51 AM FINAL ATTEMPT MADE - message left Yes Risa Grill, RN, Lambert Mody

## 2023-11-03 ENCOUNTER — Ambulatory Visit (INDEPENDENT_AMBULATORY_CARE_PROVIDER_SITE_OTHER): Payer: 59 | Admitting: Family Medicine

## 2023-11-03 ENCOUNTER — Telehealth: Payer: Self-pay

## 2023-11-03 ENCOUNTER — Encounter: Payer: Self-pay | Admitting: Family Medicine

## 2023-11-03 VITALS — BP 128/88 | HR 80 | Temp 98.4°F | Resp 18 | Ht 66.0 in | Wt 166.8 lb

## 2023-11-03 DIAGNOSIS — E559 Vitamin D deficiency, unspecified: Secondary | ICD-10-CM | POA: Diagnosis not present

## 2023-11-03 DIAGNOSIS — Z8249 Family history of ischemic heart disease and other diseases of the circulatory system: Secondary | ICD-10-CM

## 2023-11-03 DIAGNOSIS — F419 Anxiety disorder, unspecified: Secondary | ICD-10-CM | POA: Diagnosis not present

## 2023-11-03 DIAGNOSIS — E538 Deficiency of other specified B group vitamins: Secondary | ICD-10-CM

## 2023-11-03 DIAGNOSIS — E785 Hyperlipidemia, unspecified: Secondary | ICD-10-CM | POA: Diagnosis not present

## 2023-11-03 DIAGNOSIS — M62838 Other muscle spasm: Secondary | ICD-10-CM | POA: Diagnosis not present

## 2023-11-03 DIAGNOSIS — I10 Essential (primary) hypertension: Secondary | ICD-10-CM

## 2023-11-03 DIAGNOSIS — M8000XG Age-related osteoporosis with current pathological fracture, unspecified site, subsequent encounter for fracture with delayed healing: Secondary | ICD-10-CM

## 2023-11-03 DIAGNOSIS — M816 Localized osteoporosis [Lequesne]: Secondary | ICD-10-CM

## 2023-11-03 DIAGNOSIS — E039 Hypothyroidism, unspecified: Secondary | ICD-10-CM

## 2023-11-03 LAB — COMPREHENSIVE METABOLIC PANEL
ALT: 14 U/L (ref 0–35)
AST: 18 U/L (ref 0–37)
Albumin: 5.1 g/dL (ref 3.5–5.2)
Alkaline Phosphatase: 95 U/L (ref 39–117)
BUN: 15 mg/dL (ref 6–23)
CO2: 25 meq/L (ref 19–32)
Calcium: 10.1 mg/dL (ref 8.4–10.5)
Chloride: 104 meq/L (ref 96–112)
Creatinine, Ser: 0.94 mg/dL (ref 0.40–1.20)
GFR: 62.33 mL/min (ref >60.00)
Glucose, Bld: 104 mg/dL — ABNORMAL HIGH (ref 70–99)
Potassium: 4.7 meq/L (ref 3.5–5.1)
Sodium: 140 meq/L (ref 135–145)
Total Bilirubin: 0.8 mg/dL (ref 0.2–1.2)
Total Protein: 7.7 g/dL (ref 6.0–8.3)

## 2023-11-03 LAB — LIPID PANEL
Cholesterol: 195 mg/dL (ref 0–200)
HDL: 64 mg/dL (ref >39.00)
LDL Cholesterol: 117 mg/dL — ABNORMAL HIGH (ref 0–99)
NonHDL: 131.44
Total CHOL/HDL Ratio: 3
Triglycerides: 70 mg/dL (ref 0.0–149.0)
VLDL: 14 mg/dL (ref 0.0–40.0)

## 2023-11-03 LAB — VITAMIN B12: Vitamin B-12: 320 pg/mL (ref 211–911)

## 2023-11-03 LAB — VITAMIN D 25 HYDROXY (VIT D DEFICIENCY, FRACTURES): VITD: 62.51 ng/mL (ref 30.00–100.00)

## 2023-11-03 MED ORDER — DENOSUMAB 60 MG/ML ~~LOC~~ SOSY
60.0000 mg | PREFILLED_SYRINGE | Freq: Once | SUBCUTANEOUS | Status: AC
  Administered 2024-06-01: 60 mg via SUBCUTANEOUS

## 2023-11-03 MED ORDER — CYCLOBENZAPRINE HCL 5 MG PO TABS: 5.0000 mg | ORAL_TABLET | Freq: Three times a day (TID) | ORAL | 2 refills | Status: DC | PRN

## 2023-11-03 MED ORDER — ALPRAZOLAM 0.5 MG PO TABS: 0.5000 mg | ORAL_TABLET | Freq: Three times a day (TID) | ORAL | 0 refills | Status: AC | PRN

## 2023-11-03 NOTE — Assessment & Plan Note (Signed)
 Check labs

## 2023-11-03 NOTE — Assessment & Plan Note (Signed)
 Tolerating statin, encouraged heart healthy diet, avoid trans fats, minimize simple carbs and saturated fats. Increase exercise as tolerated

## 2023-11-03 NOTE — Telephone Encounter (Signed)
 CAM order placed 11/03/23

## 2023-11-03 NOTE — Addendum Note (Signed)
 Addended by: Thelma Barge D on: 11/03/2023 03:12 PM   Modules accepted: Orders

## 2023-11-03 NOTE — Assessment & Plan Note (Signed)
 Check bone density  Finished evenity

## 2023-11-03 NOTE — Assessment & Plan Note (Signed)
 Well controlled, no changes to meds. Encouraged heart healthy diet such as the DASH diet and exercise as tolerated.

## 2023-11-03 NOTE — Telephone Encounter (Signed)
 Pt seen in office today and advised Dr. Laury Boyer she has finished Evenity and has not heard anything regarding Prolia. Looks like patient was getting Evenity at the infusion clinic.

## 2023-11-03 NOTE — Progress Notes (Signed)
 Established Patient Office Visit  Subjective   Patient ID: Kathryn Boyer, female    DOB: [birth year]  Age: 68 y.o. MRN: [id number]  Chief Complaint  Patient presents with   Hypertension   Hyperlipidemia   Hypothyroidism   Follow-up    HPI Discussed the use of AI scribe software for clinical note transcription with the patient, who gave verbal consent to proceed.  History of Present Illness   The patient is a 68 year old who presents with numbness in both hands during sleep and episodes of indigestion.  She experiences numbness in both hands during sleep, regardless of her sleeping position. The numbness affects her entire hands and occurs when she sleeps on her side or lies flat with her head elevated. Her hands also go numb when holding her phone or using a mouse, and she has difficulty sleeping on one side due to this issue.  She has episodes of indigestion every two to three months, characterized by pain on her side that radiates downwards. These episodes can last for an entire day before resolving spontaneously. She recalls a recent episode where significant pain persisted throughout the day, resolving upon arrival at the hospital.  She has a history of osteoporosis and completed a year of Evenity treatment, which she found painful and caused difficulty walking. She has not started any new osteoporosis treatment since completing Evenity.  She mentions a history of high cholesterol, describing it as 'sticky cholesterol'.  She has a history of a hysterectomy and previously sought treatment for incontinence, which included a laser procedure that she found painful and did not continue.  She has a family history of Alzheimer's disease, with her mother-in-law and several of her first cousins affected by the condition.  No swelling in the ankles.      Patient Active Problem List   Diagnosis Date Noted   Mitral regurgitation, mild on last echocardiogram October 2019 06/23/2023    Chest discomfort, appears noncardiac with epigastric discomfort 06/23/2023   Anxiety    Asthma    Hypertension    Plantar fasciitis    Thyroid disease    Dysuria 12/05/2022   High risk medication use 12/05/2022   Post-nasal drainage 08/28/2022   Bronchitis 08/09/2022   Acute non-recurrent pansinusitis 08/09/2022   Overweight (BMI 25.0-29.9) 04/23/2022   Preventative health care 10/26/2021   Pressure injury of skin of sacral region 08/10/2021   Acute cystitis without hematuria 08/10/2021   Acute blood loss anemia    History of hypertension    Drug induced constipation    Postoperative pain    Critical polytrauma 07/27/2021   Humerus fracture 07/21/2021   Tibial plateau fracture, left 07/21/2021   Nondisplaced fracture of fifth metatarsal bone, right foot, initial encounter for closed fracture 07/21/2021   Nondisplaced fracture of lateral malleolus of right fibula, initial encounter for closed fracture 07/21/2021   Left trimalleolar fracture, closed, initial encounter 07/21/2021   Fall 07/20/2021   Acute recurrent ethmoidal sinusitis 11/29/2020   Urine frequency 04/29/2019   Chest pain 04/29/2019   Urinary frequency 04/29/2019   Post-menopausal 04/29/2019   Allergic rhinitis 10/13/2018   Mild intermittent asthma with acute exacerbation 10/13/2018   Gastroesophageal reflux disease 10/13/2018   Right hip pain 06/01/2018   Chronic low back pain 05/25/2018   Acute pain of right knee 05/25/2018   Thoracic aorta atherosclerosis (HCC) 05/25/2018   Generalized anxiety disorder 03/22/2017   Hyperlipidemia 03/22/2017   Hypothyroidism 03/22/2017   Osteoporosis 12/20/2016   Encounter for  counseling 09/18/2016   Vaginal dryness 02/06/2016   Allergic rhinitis due to pollen 02/01/2016   Acute maxillary sinusitis 02/01/2016   Essential hypertension 02/01/2016   Asthma with acute exacerbation 02/01/2016   Lumbar radiculopathy, acute 11/01/2015   Osteoporosis, 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   She is having headaches in the face at least twice a week.  Denies colored rhinorrhea.  Some left-sided nasal congestion.  Some postnasal drainage throat awareness.   EXAM shows no intranasal polyps or purulence with a reasonable airway.  Oral cavity oropharynx is unremarkable.   PLAN: Discussed this is most likely migraine and reflux induced irrit   Adjustment disorder with anxiety 02/08/2015   Allergic rhinitis 10/13/2018   Allergic rhinitis due to pollen 02/01/2016   Anxiety    Asthma    Asthma with acute exacerbation 02/01/2016   Bronchitis 08/09/2022   Chest pain 04/29/2019   Chronic low back pain 05/25/2018   Critical polytrauma 07/27/2021   Drug induced constipation    Dysuria 12/05/2022   Encounter for counseling 09/18/2016   Essential hypertension 02/01/2016   Fall 07/20/2021   Gastroesophageal reflux disease 10/13/2018   Generalized anxiety disorder 03/22/2017   High risk medication use 12/05/2022   History of hypertension    Humerus fracture 07/21/2021   Hyperlipidemia    Hypertension    Hypothyroidism 03/22/2017   Left trimalleolar fracture, closed, initial encounter 07/21/2021   Lumbar radiculopathy, acute 11/01/2015   Mild intermittent asthma with acute exacerbation 10/13/2018   Nondisplaced fracture of fifth metatarsal bone, right foot, initial encounter for closed fracture 07/21/2021   Nondisplaced fracture of lateral malleolus of right fibula, initial encounter for closed fracture 07/21/2021   Osteoporosis  12/20/2016   Osteoporosis, 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   Overweight (BMI 25.0-29.9) 04/23/2022   Plantar fasciitis    Post-menopausal 04/29/2019   Post-nasal drainage 08/28/2022   Postoperative pain    Pressure injury of skin of sacral region 08/10/2021   Preventative health care 10/26/2021   Right hip pain 06/01/2018   Thoracic aorta atherosclerosis (HCC) 05/25/2018   Thyroid disease    Tibial plateau fracture, left 07/21/2021   Urinary frequency 04/29/2019   Urine frequency 04/29/2019   Vaginal dryness 02/06/2016   Past Surgical History:  Procedure Laterality Date   ABDOMINAL HYSTERECTOMY     BREAST SURGERY     HARDWARE REMOVAL Left 01/08/2022   Procedure: HARDWARE REMOVAL;  Surgeon: Myrene Galas, MD;  Location: [facility] OR;  Service: Orthopedics;  Laterality: Left;   NOSE SURGERY     ORIF ANKLE FRACTURE Left 07/23/2021   Procedure: OPEN REDUCTION INTERNAL FIXATION (ORIF) ANKLE FRACTURE;  Surgeon: Myrene Galas, MD;  Location: [facility] OR;  Service: Orthopedics;  Laterality: Left;   REVERSE SHOULDER ARTHROPLASTY Left 07/25/2021   Procedure: LEFT REVERSE SHOULDER ARTHROPLASTY;  Surgeon: Bjorn Pippin, MD;  Location: [facility] OR;  Service: Orthopedics;  Laterality: Left;   Social History   Tobacco Use   Smoking status: Never   Smokeless tobacco: Never  Vaping Use   Vaping status: Never Used  Substance Use  Topics   Alcohol use: No   Drug use: No   Social History   Socioeconomic History   Marital status: Married    Spouse name: Not on file   Number of children: Not on file   Years of education: Not on file   Highest education level: Not on file  Occupational History   Not on file  Tobacco Use   Smoking status: Never   Smokeless tobacco: Never  Vaping Use   Vaping status: Never Used  Substance and Sexual Activity   Alcohol use: No   Drug use: No   Sexual activity: Not on file  Other Topics Concern   Not on file  Social History Narrative   Exercise-sometimes   Social  Drivers of Health   Financial Resource Strain: Low Risk  (12/02/2022)   Overall Financial Resource Strain (CARDIA)    Difficulty of Paying Living Expenses: Not hard at all  Food Insecurity: No Food Insecurity (12/02/2022)   Hunger Vital Sign    Worried About Running Out of Food in the Last Year: Never true    Ran Out of Food in the Last Year: Never true  Transportation Needs: No Transportation Needs (12/02/2022)   PRAPARE - Administrator, Civil Service (Medical): No    Lack of Transportation (Non-Medical): No  Physical Activity: Sufficiently Active (12/02/2022)   Exercise Vital Sign    Days of Exercise per Week: 5 days    Minutes of Exercise per Session: 60 min  Stress: No Stress Concern Present (12/02/2022)   [employer] of Occupational Health - Occupational Stress Questionnaire    Feeling of Stress : Not at all  Social Connections: Moderately Integrated (12/02/2022)   Social Connection and Isolation Panel [NHANES]    Frequency of Communication with Friends and Family: More than three times a week    Frequency of Social Gatherings with Friends and Family: Once a week    Attends Religious Services: More than 4 times per year    Active Member of [employer] or Organizations: No    Attends Banker Meetings: Never    Marital Status: Married  Catering manager Violence: Not At Risk (12/02/2022)   Humiliation, Afraid, Rape, and Kick questionnaire    Fear of Current or Ex-Partner: No    Emotionally Abused: No    Physically Abused: No    Sexually Abused: No   Family Status  Relation Name Status   Mother  Deceased   Father  Deceased   Sister  (Not Specified)  No partnership data on file   Family History  Problem Relation Age of Onset   Heart disease Mother    Stroke Mother    Hypertension Mother    Cancer Mother        ovarian   Aneurysm Father    Hypertension Father    Heart disease Father    Kidney disease Father    Heart disease Sister    Allergies   Allergen Reactions   Contrast Media [Iodinated Contrast Media] Hives   Iopamidol Hives    After returning home pt called to inform technologist of rash to face and neck, denies difficulty breathing or swallowing, pt took 2 benedryl  Will need premeds in future  After returning home pt called to inform technologist of rash to face and neck, denies difficulty breathing or swallowing, pt took 2 benedryl, Will need premeds in future   Liraglutide Other (See Comments)    Caused indigestion.   Amoxicillin  Other (See Comments)    Headache  Cephalosporin tolerant 07/23/2021  Other Reaction(s): Other (See Comments)   Belviq Xr [Lorcaserin Hcl Er] Itching    Caused her face to turn red.   Saxenda [Liraglutide -Weight Management] Other (See Comments)    Caused indigestion.   Penicillins Rash    Cephalosporin Tolerant 07/23/2021       Review of Systems  Constitutional:  Negative for chills, fever and malaise/fatigue.  HENT:  Negative for congestion and hearing loss.   Eyes:  Negative for blurred vision and discharge.  Respiratory:  Negative for cough, sputum production and shortness of breath.   Cardiovascular:  Negative for chest pain, palpitations and leg swelling.  Gastrointestinal:  Negative for abdominal pain, blood in stool, constipation, diarrhea, heartburn, nausea and vomiting.  Genitourinary:  Negative for dysuria, frequency, hematuria and urgency.  Musculoskeletal:  Negative for back pain, falls and myalgias.  Skin:  Negative for rash.  Neurological:  Negative for dizziness, sensory change, loss of consciousness, weakness and headaches.  Endo/Heme/Allergies:  Negative for environmental allergies. Does not bruise/bleed easily.  Psychiatric/Behavioral:  Negative for depression and suicidal ideas. The patient is not nervous/anxious and does not have insomnia.       Objective:     BP 128/88 (BP Location: Right Arm, Patient Position: Sitting, Cuff Size: Normal)   Pulse 80    Temp 98.4 F (36.9 C) (Oral)   Resp 18   Ht 5\' 6"  (1.676 m)   Wt 166 lb 12.8 oz (75.7 kg)   SpO2 99%   BMI 26.92 kg/m  BP Readings from Last 3 Encounters:  11/03/23 128/88  06/23/23 114/70  05/05/23 (!) 136/90   Wt Readings from Last 3 Encounters:  11/03/23 166 lb 12.8 oz (75.7 kg)  06/23/23 170 lb (77.1 kg)  05/05/23 178 lb 9.6 oz (81 kg)   SpO2 Readings from Last 3 Encounters:  11/03/23 99%  06/23/23 96%  05/05/23 97%      Physical Exam Vitals and nursing note reviewed.  Constitutional:      General: She is not in acute distress.    Appearance: Normal appearance. She is well-developed.  HENT:     Head: Normocephalic and atraumatic.  Eyes:     General: No scleral icterus.       Right eye: No discharge.        Left eye: No discharge.  Cardiovascular:     Rate and Rhythm: Normal rate and regular rhythm.     Heart sounds: No murmur heard. Pulmonary:     Effort: Pulmonary effort is normal. No respiratory distress.     Breath sounds: Normal breath sounds.  Musculoskeletal:        General: Normal range of motion.     Cervical back: Normal range of motion and neck supple.     Right lower leg: No edema.     Left lower leg: No edema.  Skin:    General: Skin is warm and dry.  Neurological:     Mental Status: She is alert and oriented to person, place, and time.  Psychiatric:        Mood and Affect: Mood normal.        Behavior: Behavior normal.        Thought Content: Thought content normal.        Judgment: Judgment normal.      No results found for any visits on 11/03/23.  Last CBC Lab Results  Component Value Date   WBC  5.9 05/05/2023   HGB 14.9 05/05/2023   HCT 45.8 05/05/2023   MCV 92.5 05/05/2023   MCH 30.0 03/02/2023   RDW 12.9 05/05/2023   PLT 263.0 05/05/2023   Last metabolic panel Lab Results  Component Value Date   GLUCOSE 93 05/05/2023   NA 140 05/05/2023   K 4.8 05/05/2023   CL 105 05/05/2023   CO2 27 05/05/2023   BUN 13 05/05/2023    CREATININE 1.01 05/05/2023   GFR 57.38 (L) 05/05/2023   CALCIUM 9.8 05/05/2023   PROT 7.4 05/05/2023   ALBUMIN 4.6 05/05/2023   BILITOT 0.8 05/05/2023   ALKPHOS 84 05/05/2023   AST 27 05/05/2023   ALT 19 05/05/2023   ANIONGAP 12 03/02/2023   Last lipids Lab Results  Component Value Date   CHOL 170 05/05/2023   HDL 57.10 05/05/2023   LDLCALC 98 05/05/2023   TRIG 73.0 05/05/2023   CHOLHDL 3 05/05/2023   Last hemoglobin A1c Lab Results  Component Value Date   HGBA1C 5.9 04/25/2022   Last thyroid functions Lab Results  Component Value Date   TSH 1.58 05/05/2023   T4TOTAL 8.8 04/25/2022   Last vitamin D Lab Results  Component Value Date   VD25OH 61.03 05/05/2023   Last vitamin B12 and Folate Lab Results  Component Value Date   VITAMINB12 336 05/05/2023      The 10-year ASCVD risk score (Arnett DK, et al., 2019) is: 9.5%    Assessment & Plan:   Problem List Items Addressed This Visit       Unprioritized   Osteoporosis   Relevant Orders   DG BONE DENSITY (DXA)   Hypothyroidism   Anxiety   Relevant Medications   ALPRAZolam (XANAX) 0.5 MG tablet   Hyperlipidemia   Relevant Orders   Lipoprotein A (LPA)   Lipid panel   Essential hypertension   Relevant Orders   Comprehensive metabolic panel   Other Visit Diagnoses       Family history of early CAD    -  Primary   Relevant Orders   CT CARDIAC SCORING     Muscle spasm       Relevant Medications   cyclobenzaprine (FLEXERIL) 5 MG tablet     Vitamin D deficiency       Relevant Orders   Vitamin B12   VITAMIN D 25 Hydroxy (Vit-D Deficiency, Fractures)     Vitamin B12 deficiency       Relevant Orders   Vitamin B12     Assessment and Plan    Intermittent Indigestion   She experiences intermittent indigestion lasting a whole day every 2-3 months, with pain radiating to the side. A previous hospital visit showed no significant findings. Symptoms resolve spontaneously. Discuss dietary modifications  and monitor symptoms. Follow up if symptoms worsen or become more frequent.  Numbness in Hands   Numbness occurs in both hands during sleep, possibly due to carpal tunnel syndrome or nerve compression at the neck or elbow. Symptoms also occur when holding a phone or using a mouse. Discuss ergonomic adjustments during sleep and daily activities. Follow up if symptoms persist or worsen.  Osteoporosis   Evenity treatment caused significant side effects, including difficulty walking. Plan to transition to Prolia after Evenity. She prefers to start Prolia after her trip due to previous side effects. Emphasize the need for ongoing treatment to prevent further bone loss. Administer Prolia injection after return from trip and schedule a bone density scan at [facility].  General Health  Maintenance   Extensive blood work and a calcium score for heart health were discussed. The previous calcium score was zero in 2019. She agreed to proceed with a calcium score CT scan to assess heart attack and stroke risk. Order the scan, ensure she is well hydrated before it, and monitor cholesterol levels with routine blood work.  Follow-up   Follow up after return from trip for Prolia injection and schedule a bone density scan at [facility]. Monitor and follow up on indigestion and numbness in hands if symptoms persist or worsen.        No follow-ups on file.    Donato Schultz, DO

## 2023-11-06 ENCOUNTER — Other Ambulatory Visit: Payer: Self-pay | Admitting: Family Medicine

## 2023-11-06 DIAGNOSIS — E039 Hypothyroidism, unspecified: Secondary | ICD-10-CM

## 2023-11-06 LAB — LIPOPROTEIN A (LPA): Lipoprotein (a): 32 nmol/L (ref <75)

## 2023-11-12 ENCOUNTER — Encounter: Payer: Self-pay | Admitting: Family Medicine

## 2023-11-15 ENCOUNTER — Other Ambulatory Visit: Payer: Self-pay | Admitting: Family Medicine

## 2023-11-15 DIAGNOSIS — I10 Essential (primary) hypertension: Secondary | ICD-10-CM

## 2023-11-25 DIAGNOSIS — M25512 Pain in left shoulder: Secondary | ICD-10-CM | POA: Diagnosis not present

## 2023-11-25 DIAGNOSIS — R0781 Pleurodynia: Secondary | ICD-10-CM | POA: Diagnosis not present

## 2023-11-26 ENCOUNTER — Other Ambulatory Visit (HOSPITAL_BASED_OUTPATIENT_CLINIC_OR_DEPARTMENT_OTHER)

## 2023-11-28 ENCOUNTER — Telehealth: Payer: Self-pay

## 2023-11-28 NOTE — Telephone Encounter (Signed)
 Prolia VOB initiated via [email or web address]

## 2023-12-01 ENCOUNTER — Other Ambulatory Visit (HOSPITAL_COMMUNITY): Payer: Self-pay

## 2023-12-01 NOTE — Telephone Encounter (Signed)
 Resubmitted into into [employer] portal for benefit verification

## 2023-12-01 NOTE — Telephone Encounter (Signed)
 Marland Kitchen

## 2023-12-02 DIAGNOSIS — M25512 Pain in left shoulder: Secondary | ICD-10-CM | POA: Diagnosis not present

## 2023-12-08 ENCOUNTER — Other Ambulatory Visit (HOSPITAL_COMMUNITY): Payer: Self-pay

## 2023-12-08 NOTE — Telephone Encounter (Signed)
 Kathryn Boyer

## 2023-12-08 NOTE — Telephone Encounter (Signed)
 Pt ready for scheduling for Prolia on or after : 12/08/23  Option# 1: Buy/Bill (Office supplied medication)  Out-of-pocket cost due at time of clinic visit: $137.75  Number of injection/visits approved: ---  Primary: MEDICARE Prolia co-insurance: 0% Admin fee co-insurance: 0%  Secondary: A[employer]-MEDSUP Prolia co-insurance: covers the Medicare Part B co-insurance and 100% of the excess charges. This plan does not cover the Medicare Part B deductible. Admin fee co-insurance:   Medical Benefit Details: Date Benefits were checked: 12/05/23 Deductible: $119.25 Met of $257 Required/ Coinsurance: 0%/ Admin Fee: 0%  Prior Auth: N/A PA# Expiration Date:   # of doses approved: ----------------------------------------------------------------------- Option# 2- Med Obtained from pharmacy:  Pharmacy benefit: Copay $1004.29 (Paid to pharmacy) Admin Fee: 0% (Pay at clinic)  Prior Auth: N/A PA# Expiration Date:   # of doses approved:   If patient wants fill through the pharmacy benefit please send prescription to:  [facility] , and include estimated need by date in rx notes. Pharmacy will ship medication directly to the office.  Patient NOT eligible for Prolia Copay Card. Copay Card can make patient's cost as little as $25. Link to apply: https://www.amgensupportplus.com/copay  ** This summary of benefits is an estimation of the patient's out-of-pocket cost. Exact cost may very based on individual plan coverage.

## 2023-12-13 ENCOUNTER — Other Ambulatory Visit: Payer: Self-pay | Admitting: Family Medicine

## 2023-12-13 DIAGNOSIS — E559 Vitamin D deficiency, unspecified: Secondary | ICD-10-CM

## 2023-12-13 DIAGNOSIS — E785 Hyperlipidemia, unspecified: Secondary | ICD-10-CM

## 2023-12-16 ENCOUNTER — Ambulatory Visit

## 2023-12-16 VITALS — BP 124/82 | HR 62 | Ht 68.0 in | Wt 166.0 lb

## 2023-12-16 DIAGNOSIS — E782 Mixed hyperlipidemia: Secondary | ICD-10-CM | POA: Diagnosis not present

## 2023-12-16 DIAGNOSIS — I10 Essential (primary) hypertension: Secondary | ICD-10-CM | POA: Insufficient documentation

## 2023-12-16 DIAGNOSIS — I340 Nonrheumatic mitral (valve) insufficiency: Secondary | ICD-10-CM | POA: Insufficient documentation

## 2023-12-16 NOTE — Assessment & Plan Note (Signed)
 Well-controlled. Continue olmesartan  40 mg once daily. Target blood pressure below 130/80 mmHg.

## 2023-12-16 NOTE — Assessment & Plan Note (Signed)
 Repeat echocardiogram showed trace MR.  Not significant hemodynamically.  No further follow-up imaging required at this time.

## 2023-12-16 NOTE — Patient Instructions (Signed)
 Medication Instructions:  Your physician recommends that you continue on your current medications as directed. Please refer to the Current Medication list given to you today.  *If you need a refill on your cardiac medications before your next appointment, please call your pharmacy*   Lab Work: None Ordered If you have labs (blood work) drawn today and your tests are completely normal, you will receive your results only by: MyChart Message (if you have MyChart) OR A paper copy in the mail If you have any lab test that is abnormal or we need to change your treatment, we will call you to review the results.   Testing/Procedures: None Ordered   Follow-Up: At [facility], you and your health needs are our priority.  As part of our continuing mission to provide you with exceptional heart care, we have created designated Provider Care Teams.  These Care Teams include your primary Cardiologist (physician) and Advanced Practice Providers (APPs -  Physician Assistants and Nurse Practitioners) who all work together to provide you with the care you need, when you need it.  We recommend signing up for the patient portal called "MyChart".  Sign up information is provided on this After Visit Summary.  MyChart is used to connect with patients for Virtual Visits (Telemedicine).  Patients are able to view lab/test results, encounter notes, upcoming appointments, etc.  Non-urgent messages can be sent to your provider as well.   To learn more about what you can do with MyChart, go to [email or web address].    Your next appointment: 1 year follow up

## 2023-12-16 NOTE — Progress Notes (Signed)
 Cardiology Consultation:    Date:  12/16/2023   ID:  Kathryn Boyer, DOB [birth year], MRN [id number]  PCP:  Kathryn Boyer, Kathryn Chalk, DO  Cardiologist:  Daymon Evans Josaphine Shimamoto, MD   Referring MD: Kathryn Boyer, Kathryn Boyer, *   No chief complaint on file.    ASSESSMENT AND PLAN:   Ms. Kathryn Boyer 68 year old history of trace mitral regurgitation, hypertension, hyperlipidemia, hypothyroidism, anxiety, obesity, remote smoking history for short duration, calcium  score of 0 in December 2019 with CAD RADS 0 study, was seen for symptoms of atypical chest discomfort suggestive of gastritis and reflux.  Currently has another calcium  score study pending as requested by PCP.  Problem List Items Addressed This Visit     Essential hypertension - Primary   Well-controlled. Continue olmesartan  40 mg once daily. Target blood pressure below 130/80 mmHg.      Hyperlipidemia   Recent lipid panel from 11-03-2023 reviewed total cholesterol 195, triglycerides 70, HDL 64, LDL 117.  Lipoprotein a 32. Optimal control. Tolerating pravastatin  continue pravastatin  20 mg once daily.  She wishes to continue following up assessing for any significant coronary atherosclerosis and has pending cardiac calcium  score study.  Will review results once available.       Mitral regurgitation, mild on prior echocardiogram October 2019; trace on recent echo November 2024   Repeat echocardiogram showed trace MR.  Not significant hemodynamically.  No further follow-up imaging required at this time.      Return to clinic for follow-up tentatively in 1 year or as needed.   History of Present Illness:    Kathryn Boyer is a 68 y.o. female who is being seen today for follow-up visit. PCP is Kathryn Boyer, Kathryn Boyer, *. Last visit with me in the office was 06-15-2023.  Here for the visit by herself.  Lives part-time between here in [place] .  Has history of trace mitral regurgitation, hypertension, hyperlipidemia, hypothyroidism,  anxiety, obesity, remote smoking history for short duration, calcium  score of 0 in December 2019 with CAD RADS 0 study, was seen for symptoms of atypical chest discomfort suggestive of gastritis and reflux.  Prefers to wean herself off statin therapy.  Currently has another calcium  score study pending as requested by PCP.  Echocardiogram from 07/14/2023 reported normal biventricular function EF 60 to 65%, normal diastolic function, borderline dilated left atrium with volume index 33 mL/m, mitral regurgitation was noted to be trivial.  Lipid panel from 11-03-2023 noted total cholesterol 195, triglycerides 70, HDL 64, LDL 117.  Lipoprotein a 32  Mentions overall she is doing well.  Unfortunately during her recent stay in [place]  taking care of grandchild she tripped and fell as she was rushing to assist her grandchild, injuring her left shoulder sustaining a fracture being conservatively managed with a sling.  No significant cardiac symptoms.  Denies any chest pain shortness of breath.  No palpitations.  No other significant cardiac issues.  Blood pressures well-controlled.  She tells me at this time she prefers to continue taking the pravastatin  given her family history for heart disease.  Past Medical History:  Diagnosis Date   Abnormal weight gain 02/08/2015   Acute blood loss anemia    Acute cystitis without hematuria 08/10/2021   Acute maxillary sinusitis 02/01/2016   Acute non-recurrent pansinusitis 08/09/2022   Acute pain of right knee 05/25/2018   Acute recurrent ethmoidal sinusitis 11/29/2020   Last Assessment & Plan:    Sinus headaches.   She is having headaches in the face at least twice  a week.  Denies colored rhinorrhea.  Some left-sided nasal congestion.  Some postnasal drainage throat awareness.   EXAM shows no intranasal polyps or purulence with a reasonable airway.  Oral cavity oropharynx is unremarkable.   PLAN: Discussed this is most likely migraine and reflux induced irrit    Adjustment disorder with anxiety 02/08/2015   Allergic rhinitis 10/13/2018   Allergic rhinitis due to pollen 02/01/2016   Anal skin tag 07/08/2023   Anxiety    Asthma    Asthma with acute exacerbation 02/01/2016   Bronchitis 08/09/2022   Chest discomfort, appears noncardiac with epigastric discomfort 06/23/2023   Chest pain 04/29/2019   Chronic low back pain 05/25/2018   Critical polytrauma 07/27/2021   Drug induced constipation    Dysuria 12/05/2022   Encounter for counseling 09/18/2016   Essential hypertension 02/01/2016   Fall 07/20/2021   Gastroesophageal reflux disease 10/13/2018   Generalized anxiety disorder 03/22/2017   High risk medication use 12/05/2022   History of hypertension    Humerus fracture 07/21/2021   Hyperlipidemia    Hypertension    Hypothyroidism 03/22/2017   Left trimalleolar fracture, closed, initial encounter 07/21/2021   Lumbar radiculopathy, acute 11/01/2015   Mild intermittent asthma with acute exacerbation 10/13/2018   Mitral regurgitation, mild on last echocardiogram October 2019 06/23/2023   Nondisplaced fracture of fifth metatarsal bone, right foot, initial encounter for closed fracture 07/21/2021   Nondisplaced fracture of lateral malleolus of right fibula, initial encounter for closed fracture 07/21/2021   Osteoporosis 12/20/2016   Osteoporosis, 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   Overweight (BMI 25.0-29.9) 04/23/2022   Plantar fasciitis    Post-menopausal 04/29/2019   Post-nasal drainage 08/28/2022   Postoperative pain    Pressure injury of skin of sacral region 08/10/2021   Preventative health care 10/26/2021   Right hip pain 06/01/2018   Thoracic aorta atherosclerosis (HCC) 05/25/2018   Thyroid  disease    Tibial plateau fracture, left 07/21/2021   Urinary frequency 04/29/2019   Urine frequency 04/29/2019   Vaginal dryness 02/06/2016    Past Surgical History:  Procedure Laterality Date   ABDOMINAL HYSTERECTOMY     BREAST SURGERY      HARDWARE REMOVAL Left 01/08/2022   Procedure: HARDWARE REMOVAL;  Surgeon: Hardy Lia, MD;  Location: [facility] OR;  Service: Orthopedics;  Laterality: Left;   NOSE SURGERY     ORIF ANKLE FRACTURE Left 07/23/2021   Procedure: OPEN REDUCTION INTERNAL FIXATION (ORIF) ANKLE FRACTURE;  Surgeon: Hardy Lia, MD;  Location: [facility] OR;  Service: Orthopedics;  Laterality: Left;   REVERSE SHOULDER ARTHROPLASTY Left 07/25/2021   Procedure: LEFT REVERSE SHOULDER ARTHROPLASTY;  Surgeon: Micheline Ahr, MD;  Location: [facility] OR;  Service: Orthopedics;  Laterality: Left;    Current Medications: Current Meds  Medication Sig   ALPRAZolam  (XANAX ) 0.5 MG tablet Take 1 tablet (0.5 mg total) by mouth 3 (three) times daily as needed for anxiety.   Camphor-Menthol -Methyl Sal (SALONPAS) 3.08-31-08 % PTCH Apply 5 patches topically daily.   cyclobenzaprine  (FLEXERIL ) 5 MG tablet Take 1 tablet (5 mg total) by mouth every 8 (eight) hours as needed.   diphenhydrAMINE  (BENADRYL ) 25 MG tablet Take 25 mg by mouth every 6 (six) hours as needed for allergies.   estradiol  (ESTRACE ) 0.1 MG/GM vaginal cream Place 1 Applicatorful vaginally 3 (three) times a week.   famotidine -calcium  carbonate-magnesium  hydroxide (PEPCID  COMPLETE) 10-800-165 MG chewable tablet Chew 1 tablet by mouth daily as needed (Heartburn).   fluticasone  (FLOVENT  HFA) 110 MCG/ACT inhaler Inhale 2 puffs into the  lungs as needed.   ibuprofen  (ADVIL ) 200 MG tablet Take 600 mg by mouth every 6 (six) hours as needed for mild pain or moderate pain.   levothyroxine  (SYNTHROID ) 88 MCG tablet Take 1 tablet (88 mcg total) by mouth daily before breakfast.   Multiple Vitamins-Minerals (MULTIVITAMIN WITH MINERALS) tablet Take 1 tablet by mouth daily. V thrive   olmesartan  (BENICAR ) 40 MG tablet TAKE 1 TABLET BY MOUTH EVERY DAY   omeprazole (PRILOSEC) 40 MG capsule Take 40 mg by mouth daily.   OVER THE COUNTER MEDICATION Apply 1 application. topically daily as needed (Pain). Body  Balm   pravastatin  (PRAVACHOL ) 20 MG tablet TAKE 1 TABLET BY MOUTH EVERY DAY   Probiotic Product (PROBIOTIC PO) Take 1 capsule by mouth daily.   VENTOLIN  HFA 108 (90 Base) MCG/ACT inhaler Inhale 2 puffs into the lungs every 4 (four) hours as needed for wheezing or shortness of breath.   Vitamin D , Ergocalciferol , (DRISDOL ) 1.25 MG (50000 UNIT) CAPS capsule TAKE 1 CAPSULE (50,000 UNITS TOTAL) BY MOUTH EVERY 7 (SEVEN) DAYS   Current Facility-Administered Medications for the 12/16/23 encounter (Office Visit) with Cordell Guercio, Daymon Evans, MD  Medication   denosumab  (PROLIA ) injection 60 mg     Allergies:   Contrast media [iodinated contrast media], Iopamidol , Liraglutide , Amoxicillin , Belviq xr [lorcaserin  hcl er], Saxenda  [liraglutide  -weight management], and Penicillins   Social History   Socioeconomic History   Marital status: Married    Spouse name: Not on file   Number of children: Not on file   Years of education: Not on file   Highest education level: Not on file  Occupational History   Not on file  Tobacco Use   Smoking status: Never   Smokeless tobacco: Never  Vaping Use   Vaping status: Never Used  Substance and Sexual Activity   Alcohol use: No   Drug use: No   Sexual activity: Not on file  Other Topics Concern   Not on file  Social History Narrative   Exercise-sometimes   Social Drivers of Health   Financial Resource Strain: Low Risk  (12/02/2022)   Overall Financial Resource Strain (CARDIA)    Difficulty of Paying Living Expenses: Not hard at all  Food Insecurity: No Food Insecurity (12/02/2022)   Hunger Vital Sign    Worried About Running Out of Food in the Last Year: Never true    Ran Out of Food in the Last Year: Never true  Transportation Needs: No Transportation Needs (12/02/2022)   PRAPARE - Administrator, Civil Service (Medical): No    Lack of Transportation (Non-Medical): No  Physical Activity: Sufficiently Active (12/02/2022)   Exercise Vital Sign     Days of Exercise per Week: 5 days    Minutes of Exercise per Session: 60 min  Stress: No Stress Concern Present (12/02/2022)   [employer] of Occupational Health - Occupational Stress Questionnaire    Feeling of Stress : Not at all  Social Connections: Moderately Integrated (12/02/2022)   Social Connection and Isolation Panel [NHANES]    Frequency of Communication with Friends and Family: More than three times a week    Frequency of Social Gatherings with Friends and Family: Once a week    Attends Religious Services: More than 4 times per year    Active Member of [employer] or Organizations: No    Attends Banker Meetings: Never    Marital Status: Married     Family History: The patient's family history includes  Aneurysm in her father; Cancer in her mother; Heart disease in her father, mother, and sister; Hypertension in her father and mother; Kidney disease in her father; Stroke in her mother. ROS:   Please see the history of present illness.    All 14 point review of systems negative except as described per history of present illness.  EKGs/Labs/Other Studies Reviewed:    The following studies were reviewed today:   EKG:       Recent Labs: 05/05/2023: Hemoglobin 14.9; Platelets 263.0; TSH 1.58 11/03/2023: ALT 14; BUN 15; Creatinine, Ser 0.94; Potassium 4.7; Sodium 140  Recent Lipid Panel    Component Value Date/Time   CHOL 195 11/03/2023 0933   TRIG 70.0 11/03/2023 0933   HDL 64.00 11/03/2023 0933   CHOLHDL 3 11/03/2023 0933   VLDL 14.0 11/03/2023 0933   LDLCALC 117 (H) 11/03/2023 0933   LDLCALC 105 (H) 10/25/2020 0750    Physical Exam:    VS:  BP 124/82   Pulse 62   Ht 5\' 8"  (1.727 m)   Wt 166 lb (75.3 kg)   SpO2 96%   BMI 25.24 kg/m     Wt Readings from Last 3 Encounters:  12/16/23 166 lb (75.3 kg)  11/03/23 166 lb 12.8 oz (75.7 kg)  06/23/23 170 lb (77.1 kg)     GENERAL:  Well nourished, well developed in no acute distress NECK: No JVD; No  carotid bruits CARDIAC: RRR, S1 and S2 present, no murmurs, no rubs, no gallops CHEST:  Clear to auscultation without rales, wheezing or rhonchi  Extremities: No pitting pedal edema. Pulses bilaterally symmetric with radial 2+ and dorsalis pedis 2+.  Left shoulder in a sling. NEUROLOGIC:  Alert and oriented x 3  Medication Adjustments/Labs and Tests Ordered: Current medicines are reviewed at length with the patient today.  Concerns regarding medicines are outlined above.  No orders of the defined types were placed in this encounter.  No orders of the defined types were placed in this encounter.   Signed, Lura Sallies, MD, MPH, [facility]. 12/16/2023 9:47 AM    [facility]

## 2023-12-16 NOTE — Assessment & Plan Note (Addendum)
 Recent lipid panel from 11-03-2023 reviewed total cholesterol 195, triglycerides 70, HDL 64, LDL 117.  Lipoprotein a 32. Optimal control. Tolerating pravastatin  continue pravastatin  20 mg once daily.  She wishes to continue following up assessing for any significant coronary atherosclerosis and has pending cardiac calcium  score study.  Will review results once available.

## 2023-12-26 DIAGNOSIS — M25512 Pain in left shoulder: Secondary | ICD-10-CM | POA: Diagnosis not present

## 2023-12-31 ENCOUNTER — Encounter (HOSPITAL_COMMUNITY): Payer: Self-pay

## 2023-12-31 ENCOUNTER — Encounter: Payer: Self-pay | Admitting: *Deleted

## 2024-01-07 ENCOUNTER — Ambulatory Visit (INDEPENDENT_AMBULATORY_CARE_PROVIDER_SITE_OTHER)

## 2024-01-07 VITALS — BP 124/82 | Ht 68.0 in | Wt 167.0 lb

## 2024-01-07 DIAGNOSIS — Z2821 Immunization not carried out because of patient refusal: Secondary | ICD-10-CM

## 2024-01-07 DIAGNOSIS — Z0000 Encounter for general adult medical examination without abnormal findings: Secondary | ICD-10-CM | POA: Diagnosis not present

## 2024-01-07 NOTE — Progress Notes (Signed)
 Because this visit was a virtual/telehealth visit,  certain criteria was not obtained, such a blood pressure, CBG if applicable, and timed get up and go. Any medications not marked as "taking" were not mentioned during the medication reconciliation part of the visit. Any vitals not documented were not able to be obtained due to this being a telehealth visit or patient was unable to self-report a recent blood pressure reading due to a lack of equipment at home via telehealth. Vitals that have been documented are verbally provided by the patient.  This visit was performed by a medical professional under my direct supervision. I was immediately available for consultation/collaboration. I have reviewed and agree with the Annual Wellness Visit documentation.  Subjective:   Kathryn Boyer is a 68 y.o. who presents for a Medicare Wellness preventive visit.  As a reminder, Annual Wellness Visits don't include a physical exam, and some assessments may be limited, especially if this visit is performed virtually. We may recommend an in-person visit if needed.  Visit Complete: Virtual I connected with  Kaida Luhn on 01/07/24 by a audio enabled telemedicine application and verified that I am speaking with the correct person using two identifiers.  Patient Location: Home  Provider Location: Home Office  I discussed the limitations of evaluation and management by telemedicine. The patient expressed understanding and agreed to proceed.  Vital Signs: Because this visit was a virtual/telehealth visit, some criteria may be missing or patient reported. Any vitals not documented were not able to be obtained and vitals that have been documented are patient reported.  VideoDeclined- This patient declined Librarian, academic. Therefore the visit was completed with audio only.  Persons Participating in Visit: Patient.  AWV Questionnaire: No: Patient Medicare AWV questionnaire was not  completed prior to this visit.  Cardiac Risk Factors include: advanced age (>18men, >62 women);hypertension     Objective:     Today's Vitals   01/07/24 0819 01/07/24 0820  BP: 124/82   Weight: 167 lb (75.8 kg)   Height: 5\' 8"  (1.727 m)   PainSc:  5    Body mass index is 25.39 kg/m.     01/07/2024    8:18 AM 03/02/2023    9:41 PM 12/18/2022   11:01 AM 12/02/2022    9:51 AM 01/08/2022    6:27 AM 10/10/2021    9:47 AM 07/27/2021    7:00 PM  Advanced Directives  Does Patient Have a Medical Advance Directive? No Yes Yes Yes Yes Yes   Type of Special educational needs teacher of [place];Living will Healthcare Power of [place];Living will Healthcare Power of [place];Living will Healthcare Power of [place];Living will Healthcare Power of [place];Living will Healthcare Power of [place];Living will  Does patient want to make changes to medical advance directive?    No - Patient declined   No - Patient declined  Copy of Healthcare Power of Attorney in Chart?   No - copy requested No - copy requested No - copy requested No - copy requested No - copy requested  Would patient like information on creating a medical advance directive? No - Patient declined          Current Medications (verified) Outpatient Encounter Medications as of 01/07/2024  Medication Sig   ALPRAZolam  (XANAX ) 0.5 MG tablet Take 1 tablet (0.5 mg total) by mouth 3 (three) times daily as needed for anxiety.   Camphor-Menthol -Methyl Sal (SALONPAS) 3.08-31-08 % PTCH Apply 5 patches topically daily.   cyclobenzaprine  (FLEXERIL ) 5 MG tablet  Take 1 tablet (5 mg total) by mouth every 8 (eight) hours as needed.   diphenhydrAMINE  (BENADRYL ) 25 MG tablet Take 25 mg by mouth every 6 (six) hours as needed for allergies.   estradiol  (ESTRACE ) 0.1 MG/GM vaginal cream Place 1 Applicatorful vaginally 3 (three) times a week.   famotidine -calcium  carbonate-magnesium  hydroxide (PEPCID  COMPLETE) 10-800-165 MG chewable tablet Chew 1 tablet by  mouth daily as needed (Heartburn).   fluticasone  (FLOVENT  HFA) 110 MCG/ACT inhaler Inhale 2 puffs into the lungs as needed.   ibuprofen  (ADVIL ) 200 MG tablet Take 600 mg by mouth every 6 (six) hours as needed for mild pain or moderate pain.   levothyroxine  (SYNTHROID ) 88 MCG tablet Take 1 tablet (88 mcg total) by mouth daily before breakfast.   Multiple Vitamins-Minerals (MULTIVITAMIN WITH MINERALS) tablet Take 1 tablet by mouth daily. V thrive   olmesartan  (BENICAR ) 40 MG tablet TAKE 1 TABLET BY MOUTH EVERY DAY   omeprazole (PRILOSEC) 40 MG capsule Take 40 mg by mouth daily.   OVER THE COUNTER MEDICATION Apply 1 application. topically daily as needed (Pain). Body Balm   pravastatin  (PRAVACHOL ) 20 MG tablet TAKE 1 TABLET BY MOUTH EVERY DAY   Probiotic Product (PROBIOTIC PO) Take 1 capsule by mouth daily.   VENTOLIN  HFA 108 (90 Base) MCG/ACT inhaler Inhale 2 puffs into the lungs every 4 (four) hours as needed for wheezing or shortness of breath.   Vitamin D , Ergocalciferol , (DRISDOL ) 1.25 MG (50000 UNIT) CAPS capsule TAKE 1 CAPSULE (50,000 UNITS TOTAL) BY MOUTH EVERY 7 (SEVEN) DAYS   Facility-Administered Encounter Medications as of 01/07/2024  Medication   denosumab  (PROLIA ) injection 60 mg    Allergies (verified) Contrast media [iodinated contrast media], Iopamidol , Liraglutide , Amoxicillin , Belviq xr [lorcaserin  hcl er], Saxenda  [liraglutide  -weight management], and Penicillins   History: Past Medical History:  Diagnosis Date   Abnormal weight gain 02/08/2015   Acute blood loss anemia    Acute cystitis without hematuria 08/10/2021   Acute maxillary sinusitis 02/01/2016   Acute non-recurrent pansinusitis 08/09/2022   Acute pain of right knee 05/25/2018   Acute recurrent ethmoidal sinusitis 11/29/2020   Last Assessment & Plan:    Sinus headaches.   She is having headaches in the face at least twice a week.  Denies colored rhinorrhea.  Some left-sided nasal congestion.  Some postnasal  drainage throat awareness.   EXAM shows no intranasal polyps or purulence with a reasonable airway.  Oral cavity oropharynx is unremarkable.   PLAN: Discussed this is most likely migraine and reflux induced irrit   Adjustment disorder with anxiety 02/08/2015   Allergic rhinitis 10/13/2018   Allergic rhinitis due to pollen 02/01/2016   Anal skin tag 07/08/2023   Anxiety    Asthma    Asthma with acute exacerbation 02/01/2016   Bronchitis 08/09/2022   Chest discomfort, appears noncardiac with epigastric discomfort 06/23/2023   Chest pain 04/29/2019   Chronic low back pain 05/25/2018   Critical polytrauma 07/27/2021   Drug induced constipation    Dysuria 12/05/2022   Encounter for counseling 09/18/2016   Essential hypertension 02/01/2016   Fall 07/20/2021   Gastroesophageal reflux disease 10/13/2018   Generalized anxiety disorder 03/22/2017   High risk medication use 12/05/2022   History of hypertension    Humerus fracture 07/21/2021   Hyperlipidemia    Hypertension    Hypothyroidism 03/22/2017   Left trimalleolar fracture, closed, initial encounter 07/21/2021   Lumbar radiculopathy, acute 11/01/2015   Mild intermittent asthma with acute exacerbation 10/13/2018  Mitral regurgitation, mild on last echocardiogram October 2019 06/23/2023   Nondisplaced fracture of fifth metatarsal bone, right foot, initial encounter for closed fracture 07/21/2021   Nondisplaced fracture of lateral malleolus of right fibula, initial encounter for closed fracture 07/21/2021   Osteoporosis 12/20/2016   Osteoporosis, 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   Overweight (BMI 25.0-29.9) 04/23/2022   Plantar fasciitis    Post-menopausal 04/29/2019   Post-nasal drainage 08/28/2022   Postoperative pain    Pressure injury of skin of sacral region 08/10/2021   Preventative health care 10/26/2021   Right hip pain 06/01/2018   Thoracic aorta atherosclerosis (HCC) 05/25/2018   Thyroid  disease    Tibial plateau fracture,  left 07/21/2021   Urinary frequency 04/29/2019   Urine frequency 04/29/2019   Vaginal dryness 02/06/2016   Past Surgical History:  Procedure Laterality Date   ABDOMINAL HYSTERECTOMY     BREAST SURGERY     HARDWARE REMOVAL Left 01/08/2022   Procedure: HARDWARE REMOVAL;  Surgeon: Hardy Lia, MD;  Location: [facility] OR;  Service: Orthopedics;  Laterality: Left;   NOSE SURGERY     ORIF ANKLE FRACTURE Left 07/23/2021   Procedure: OPEN REDUCTION INTERNAL FIXATION (ORIF) ANKLE FRACTURE;  Surgeon: Hardy Lia, MD;  Location: [facility] OR;  Service: Orthopedics;  Laterality: Left;   REVERSE SHOULDER ARTHROPLASTY Left 07/25/2021   Procedure: LEFT REVERSE SHOULDER ARTHROPLASTY;  Surgeon: Micheline Ahr, MD;  Location: [facility] OR;  Service: Orthopedics;  Laterality: Left;   Family History  Problem Relation Age of Onset   Heart disease Mother    Stroke Mother    Hypertension Mother    Cancer Mother        ovarian   Aneurysm Father    Hypertension Father    Heart disease Father    Kidney disease Father    Heart disease Sister    Social History   Socioeconomic History   Marital status: Married    Spouse name: Not on file   Number of children: Not on file   Years of education: Not on file   Highest education level: Not on file  Occupational History   Not on file  Tobacco Use   Smoking status: Never   Smokeless tobacco: Never  Vaping Use   Vaping status: Never Used  Substance and Sexual Activity   Alcohol use: No   Drug use: No   Sexual activity: Not on file  Other Topics Concern   Not on file  Social History Narrative   Exercise-sometimes   Social Drivers of Health   Financial Resource Strain: Low Risk  (01/07/2024)   Overall Financial Resource Strain (CARDIA)    Difficulty of Paying Living Expenses: Not hard at all  Food Insecurity: No Food Insecurity (01/07/2024)   Hunger Vital Sign    Worried About Running Out of Food in the Last Year: Never true    Ran Out of Food in the Last Year:  Never true  Transportation Needs: No Transportation Needs (01/07/2024)   PRAPARE - Administrator, Civil Service (Medical): No    Lack of Transportation (Non-Medical): No  Physical Activity: Sufficiently Active (01/07/2024)   Exercise Vital Sign    Days of Exercise per Week: 5 days    Minutes of Exercise per Session: 60 min  Stress: No Stress Concern Present (01/07/2024)   [employer] of Occupational Health - Occupational Stress Questionnaire    Feeling of Stress : Not at all  Social Connections: Moderately Integrated (01/07/2024)   Social Connection and Isolation  Panel [NHANES]    Frequency of Communication with Friends and Family: More than three times a week    Frequency of Social Gatherings with Friends and Family: Once a week    Attends Religious Services: More than 4 times per year    Active Member of [employer] or Organizations: No    Attends Engineer, structural: Never    Marital Status: Married    Tobacco Counseling Counseling given: Not Answered    Clinical Intake:  Pre-visit preparation completed: Yes  Pain : 0-10 Pain Score: 5  Pain Type: Chronic pain Pain Location: Hip Pain Orientation: Right Pain Descriptors / Indicators: Tender, Discomfort Pain Onset: Today Pain Frequency: Rarely Pain Relieving Factors: she uses roll gel  Pain Relieving Factors: she uses roll gel  BMI - recorded: 25.39 Nutritional Status: BMI 25 -29 Overweight Nutritional Risks: None Diabetes: No  Lab Results  Component Value Date   HGBA1C 5.9 04/25/2022     How often do you need to have someone help you when you read instructions, pamphlets, or other written materials from your doctor or pharmacy?: 1 - Never What is the last grade level you completed in school?: College Graduate  Interpreter Needed?: No  Information entered by :: [employer]   Activities of Daily Living     01/07/2024    8:26 AM  In your present state of health, do you have any  difficulty performing the following activities:  Hearing? 0  Vision? 0  Difficulty concentrating or making decisions? 0  Walking or climbing stairs? 0  Dressing or bathing? 0  Doing errands, shopping? 0  Preparing Food and eating ? N  Using the Toilet? N  In the past six months, have you accidently leaked urine? N  Do you have problems with loss of bowel control? N  Managing your Medications? N  Managing your Finances? N  Housekeeping or managing your Housekeeping? N    Patient Care Team: Crecencio Dodge, Candida Chalk, DO as PCP - General (Family Medicine) Euell Herrlich, MD as PCP - Cardiology (Cardiology) Arzella Bitters, MD (Obstetrics and Gynecology) Corinne Dicker., MD as Referring Physician (Gastroenterology) Jule Nyhan, MD (Inactive) as Consulting Physician (Allergy and Immunology) Kandra Orn, MD (Radiology) Louis Row, MD as Referring Physician (Urology)  Indicate any recent Medical Services you may have received from other than [facility] providers in the past year (date may be approximate).     Assessment:    This is a routine wellness examination for Kathryn Boyer.  Hearing/Vision screen Hearing Screening - Comments:: No hearing difficulties Vision Screening - Comments:: Patient wears glasses   Goals Addressed             This Visit's Progress    Patient Stated       Patient would like to lose weight        Depression Screen     01/07/2024    8:28 AM 12/02/2022   10:14 AM 08/06/2022    2:02 PM 04/30/2022    9:31 AM 04/19/2021    8:39 AM 02/24/2020    9:27 AM 11/18/2017    3:00 PM  PHQ 2/9 Scores  PHQ - 2 Score 2 0 0 0 0 0 6  PHQ- 9 Score 2      6    Fall Risk     01/07/2024    8:24 AM 12/02/2022    9:58 AM 08/06/2022    2:01 PM 10/26/2021    9:02 AM  10/20/2020    1:37 PM  Fall Risk   Falls in the past year? 1 1 0 0 0  Comment  tripped over hose     Number falls in past yr: 1 0 0 0 0  Injury with Fall? 1 1 0 0 0  Risk for fall due to  : History of fall(s) History of fall(s) History of fall(s)    Follow up Falls evaluation completed;Falls prevention discussed Falls evaluation completed Falls evaluation completed Falls evaluation completed Falls evaluation completed    MEDICARE RISK AT HOME:  Medicare Risk at Home Any stairs in or around the home?: Yes If so, are there any without handrails?: No Home free of loose throw rugs in walkways, pet beds, electrical cords, etc?: Yes Adequate lighting in your home to reduce risk of falls?: Yes Life alert?: No Use of a cane, walker or w/c?: No Grab bars in the bathroom?: Yes Shower chair or bench in shower?: Yes Elevated toilet seat or a handicapped toilet?: Yes  TIMED UP AND GO:  Was the test performed?  No  Cognitive Function: 6CIT completed    12/02/2022   10:14 AM  MMSE - Mini Mental State Exam  Not completed: Unable to complete        01/07/2024    8:24 AM  6CIT Screen  What Year? 0 points  What month? 0 points  What time? 0 points  Count back from 20 0 points  Months in reverse 0 points  Repeat phrase 0 points  Total Score 0 points    Immunizations Immunization History  Administered Date(s) Administered   Fluad Quad(high Dose 65+) 10/20/2020, 10/26/2021   Fluad Trivalent(High Dose 65+) 07/18/2023   Influenza,inj,Quad PF,6+ Mos 05/26/2015, 07/08/2016, 06/19/2017, 06/22/2018, 04/29/2019   Moderna Sars-Covid-2 Vaccination 10/27/2019, 12/01/2019   PFIZER(Purple Top)SARS-COV-2 Vaccination 08/10/2020   PNEUMOCOCCAL CONJUGATE-20 10/26/2021   Td 05/05/2023   Tdap 07/15/2011, 09/08/2015   Zoster Recombinant(Shingrix ) 05/18/2019    Screening Tests Health Maintenance  Topic Date Due   Zoster Vaccines- Shingrix  (2 of 2) 07/13/2019   COVID-19 Vaccine (4 - 2024-25 season) 04/27/2023   INFLUENZA VACCINE  03/26/2024   MAMMOGRAM  04/30/2024   Medicare Annual Wellness (AWV)  01/06/2025   Colonoscopy  08/16/2030   DTaP/Tdap/Td (4 - Td or Tdap) 05/04/2033    Pneumonia Vaccine 28+ Years old  Completed   DEXA SCAN  Completed   Hepatitis C Screening  Completed   HPV VACCINES  Aged Out   Meningococcal B Vaccine  Aged Out    Health Maintenance  Health Maintenance Due  Topic Date Due   Zoster Vaccines- Shingrix  (2 of 2) 07/13/2019   COVID-19 Vaccine (4 - 2024-25 season) 04/27/2023   Health Maintenance Items Addressed:declined health maintenance  Additional Screening:  Vision Screening: Recommended annual ophthalmology exams for early detection of glaucoma and other disorders of the eye.  Dental Screening: Recommended annual dental exams for proper oral hygiene  Community Resource Referral / Chronic Care Management: CRR required this visit?  No   CCM required this visit?  No   Plan:    I have personally reviewed and noted the following in the patient's chart:   Medical and social history Use of alcohol, tobacco or illicit drugs  Current medications and supplements including opioid prescriptions. Patient is not currently taking opioid prescriptions. Functional ability and status Nutritional status Physical activity Advanced directives List of other physicians Hospitalizations, surgeries, and ER visits in previous 12 months Vitals Screenings to include cognitive,  depression, and falls Referrals and appointments  In addition, I have reviewed and discussed with patient certain preventive protocols, quality metrics, and best practice recommendations. A written personalized care plan for preventive services as well as general preventive health recommendations were provided to patient.   Freeda Jerry, [place]   01/07/2024   After Visit Summary: (MyChart) Due to this being a telephonic visit, the after visit summary with patients personalized plan was offered to patient via MyChart   Notes: Nothing significant to report at this time.

## 2024-01-07 NOTE — Patient Instructions (Signed)
 Ms. Kathryn Boyer , Thank you for taking time out of your busy schedule to complete your Annual Wellness Visit with me. I enjoyed our conversation and look forward to speaking with you again next year. I, as well as your care team,  appreciate your ongoing commitment to your health goals. Please review the following plan we discussed and let me know if I can assist you in the future. Your Game plan/ To Do List    Referrals: If you haven't heard from the office you've been referred to, please reach out to them at the phone provided.   Follow up Visits: Next Medicare AWV with our clinical staff: 01/12/2025   Have you seen your provider in the last 6 months (3 months if uncontrolled diabetes)? Yes Next Office Visit with your provider: 05/06/2024  Clinician Recommendations:  Aim for 30 minutes of exercise or brisk walking, 6-8 glasses of water, and 5 servings of fruits and vegetables each day.       This is a list of the screening recommended for you and due dates:  Health Maintenance  Topic Date Due   Zoster (Shingles) Vaccine (2 of 2) 07/13/2019   COVID-19 Vaccine (4 - 2024-25 season) 04/27/2023   Flu Shot  03/26/2024   Mammogram  04/30/2024   Medicare Annual Wellness Visit  01/06/2025   Colon Cancer Screening  08/16/2030   DTaP/Tdap/Td vaccine (4 - Td or Tdap) 05/04/2033   Pneumonia Vaccine  Completed   DEXA scan (bone density measurement)  Completed   Hepatitis C Screening  Completed   HPV Vaccine  Aged Out   Meningitis B Vaccine  Aged Out    Advanced directives: (Declined) Advance directive discussed with you today. Even though you declined this today, please call our office should you change your mind, and we can give you the proper paperwork for you to fill out. Advance Care Planning is important because it:  [x]  Makes sure you receive the medical care that is consistent with your values, goals, and preferences  [x]  It provides guidance to your family and loved ones and reduces their  decisional burden about whether or not they are making the right decisions based on your wishes.  Follow the link provided in your after visit summary or read over the paperwork we have mailed to you to help you started getting your Advance Directives in place. If you need assistance in completing these, please reach out to us  so that we can help you!  See attachments for Preventive Care and Fall Prevention Tips.

## 2024-01-12 ENCOUNTER — Encounter: Payer: Self-pay | Admitting: *Deleted

## 2024-01-27 ENCOUNTER — Other Ambulatory Visit: Payer: Self-pay | Admitting: Family Medicine

## 2024-01-27 ENCOUNTER — Ambulatory Visit: Admitting: Medical

## 2024-01-27 ENCOUNTER — Ambulatory Visit: Payer: Self-pay

## 2024-01-27 MED ORDER — CEPHALEXIN 500 MG PO CAPS: 500.0000 mg | ORAL_CAPSULE | Freq: Two times a day (BID) | ORAL | 0 refills | Status: DC

## 2024-01-27 NOTE — Telephone Encounter (Signed)
 Copied from CRM [id number]. Topic: Clinical - Red Word Triage >> Jan 27, 2024  9:34 AM Kathryn Boyer wrote: Red Word that prompted transfer to Nurse Triage: patient has possible UTI. She can not come in to the office today to bring a urine sample due to her not being able to get out of her garage.   Chief Complaint: Painful urination  Symptoms: Painful urination  Pertinent Negatives: Patient denies fever Disposition: [] ED /[] Urgent Care (no appt availability in office) / [x] Appointment(In office/virtual)/ []  [facility]/ [] Home Care/ [] Refused Recommended Disposition /[] [facility]/ []  Follow-up with PCP Additional Notes: Patient reports she has been experiencing painful urination and increased urinary frequency for the last couple of days. She denies any fevers. Patient states she has a history of UTI's and believes she has one now. Appointment made for today for the patient for evaluation and treatment.     Reason for Disposition  Age > 50 years  Answer Assessment - Initial Assessment Questions 1. SEVERITY: "How bad is the pain?"  (e.g., Scale 1-10; mild, moderate, or severe)   - MILD (1-3): complains slightly about urination hurting   - MODERATE (4-7): interferes with normal activities     - SEVERE (8-10): excruciating, unwilling or unable to urinate because of the pain      Mild to moderate  3. PATTERN: "Is pain present every time you urinate or just sometimes?"      Every time 4. ONSET: "When did the painful urination start?"       A couple of days  5. FEVER: "Do you have a fever?" If Yes, ask: "What is your temperature, how was it measured, and when did it start?"     No 6. PAST UTI: "Have you had a urine infection before?" If Yes, ask: "When was the last time?" and "What happened that time?"      Yes 7. CAUSE: "What do you think is causing the painful urination?"  (e.g., UTI, scratch, Herpes sore)     UTI 8. OTHER SYMPTOMS: "Do you have any other symptoms?"  (e.g., blood in urine, flank pain, genital sores, urgency, vaginal discharge)     Frequent urination  Protocols used: Urination Pain - Female-A-AH

## 2024-01-28 ENCOUNTER — Ambulatory Visit (INDEPENDENT_AMBULATORY_CARE_PROVIDER_SITE_OTHER): Admitting: Medical

## 2024-01-28 VITALS — BP 122/72 | HR 76 | Temp 98.2°F | Resp 18 | Ht 66.5 in | Wt 165.2 lb

## 2024-01-28 DIAGNOSIS — R300 Dysuria: Secondary | ICD-10-CM | POA: Diagnosis not present

## 2024-01-28 DIAGNOSIS — J302 Other seasonal allergic rhinitis: Secondary | ICD-10-CM | POA: Diagnosis not present

## 2024-01-28 DIAGNOSIS — R350 Frequency of micturition: Secondary | ICD-10-CM

## 2024-01-28 NOTE — Patient Instructions (Signed)
 Urinary Tract Infection (UTI). Probable. Dysuria and increased frequency suggest UTI. Urinalysis results obscured by Azo pills. Urine culture pending. Keflex  prescribed. May change antibiotic when C and S back.  - Initiate Keflex . - Await urine culture results to confirm diagnosis and adjust treatment if necessary. - Consider urology referral if symptoms persist.  Nasal congestion(probable allergic rhinitis) Nasal congestion, dryness, and postnasal drainage exacerbated by antihistamines. Discussed lower dose of fexofenadine to manage symptoms without excessive dryness. - Try children's fexofenadine 30 mg once daily. - Consider over-the-counter Equate brand for cost-effectiveness.  Follow up date to be determined after lab review

## 2024-01-28 NOTE — Progress Notes (Signed)
 Subjective:    Patient ID: Kathryn Boyer, female    DOB: 03-04-1956, 68 y.o.   MRN: [id number]  HPI  Kathryn Boyer is a 68 year old female who presents with a burning sensation during urination over past week.  She experiences a burning sensation during urination and increased frequency. She has been using AZO pills for relief. She recalls a previous prescription of a cream for external burning from a urologist, which she misplaced. She remembers her urologist considered a possible fungal infection but is unsure if it was ruled out.  Two years ago, she was bedridden for five months following a fall, during which she experienced severe diarrhea for a month, likely due to antibiotics and UTIs. She was treated by a urologist with estrogen cream, antibiotics, a stronger AZO pill, and creams, which resolved her symptoms within a week.  She has not undergone a cystoscopy due to her condition at the time, but urine tests were conducted. She was recently prescribed an antibiotic, Keflex , but has not started it because she wanted to complete the urine test first. She has previously found Macrobid  effective for UTIs.  No fever, chills, or sweats but she describes urgency and frequency of urination, with a recent episode of near incontinence at a store.  She also reports nasal stuffiness and dryness, using aerogel daily. She experiences difficulty clearing phlegm and has tried various antihistamines, which dry her out excessively.       Review of Systems  Constitutional:  Negative for chills, fatigue and fever.  HENT:  Positive for congestion. Negative for ear pain and postnasal drip.        Pnd  Respiratory:  Negative for cough, chest tightness, shortness of breath and wheezing.   Cardiovascular:  Negative for chest pain and palpitations.  Gastrointestinal:  Negative for abdominal pain.  Genitourinary:  Positive for dysuria and frequency. Negative for urgency.  Musculoskeletal:  Negative for  back pain and joint swelling.  Skin:  Negative for rash.  Neurological:  Negative for dizziness, syncope and light-headedness.  Hematological:  Negative for adenopathy. Does not bruise/bleed easily.  Psychiatric/Behavioral:  Negative for behavioral problems and decreased concentration. The patient is not nervous/anxious.    Past Medical History:  Diagnosis Date   Abnormal weight gain 02/08/2015   Acute blood loss anemia    Acute cystitis without hematuria 08/10/2021   Acute maxillary sinusitis 02/01/2016   Acute non-recurrent pansinusitis 08/09/2022   Acute pain of right knee 05/25/2018   Acute recurrent ethmoidal sinusitis 11/29/2020   Last Assessment & Plan:    Sinus headaches.   She is having headaches in the face at least twice a week.  Denies colored rhinorrhea.  Some left-sided nasal congestion.  Some postnasal drainage throat awareness.   EXAM shows no intranasal polyps or purulence with a reasonable airway.  Oral cavity oropharynx is unremarkable.   PLAN: Discussed this is most likely migraine and reflux induced irrit   Adjustment disorder with anxiety 02/08/2015   Allergic rhinitis 10/13/2018   Allergic rhinitis due to pollen 02/01/2016   Anal skin tag 07/08/2023   Anxiety    Asthma    Asthma with acute exacerbation 02/01/2016   Bronchitis 08/09/2022   Chest discomfort, appears noncardiac with epigastric discomfort 06/23/2023   Chest pain 04/29/2019   Chronic low back pain 05/25/2018   Critical polytrauma 07/27/2021   Drug induced constipation    Dysuria 12/05/2022   Encounter for counseling 09/18/2016   Essential hypertension 02/01/2016   Fall  07/20/2021   Gastroesophageal reflux disease 10/13/2018   Generalized anxiety disorder 03/22/2017   High risk medication use 12/05/2022   History of hypertension    Humerus fracture 07/21/2021   Hyperlipidemia    Hypertension    Hypothyroidism 03/22/2017   Left trimalleolar fracture, closed, initial encounter 07/21/2021    Lumbar radiculopathy, acute 11/01/2015   Mild intermittent asthma with acute exacerbation 10/13/2018   Mitral regurgitation, mild on last echocardiogram October 2019 06/23/2023   Nondisplaced fracture of fifth metatarsal bone, right foot, initial encounter for closed fracture 07/21/2021   Nondisplaced fracture of lateral malleolus of right fibula, initial encounter for closed fracture 07/21/2021   Osteoporosis 12/20/2016   Osteoporosis, 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   Overweight (BMI 25.0-29.9) 04/23/2022   Plantar fasciitis    Post-menopausal 04/29/2019   Post-nasal drainage 08/28/2022   Postoperative pain    Pressure injury of skin of sacral region 08/10/2021   Preventative health care 10/26/2021   Right hip pain 06/01/2018   Thoracic aorta atherosclerosis (HCC) 05/25/2018   Thyroid  disease    Tibial plateau fracture, left 07/21/2021   Urinary frequency 04/29/2019   Urine frequency 04/29/2019   Vaginal dryness 02/06/2016     Social History   Socioeconomic History   Marital status: Married    Spouse name: Not on file   Number of children: Not on file   Years of education: Not on file   Highest education level: Not on file  Occupational History   Not on file  Tobacco Use   Smoking status: Never   Smokeless tobacco: Never  Vaping Use   Vaping status: Never Used  Substance and Sexual Activity   Alcohol use: No   Drug use: No   Sexual activity: Not on file  Other Topics Concern   Not on file  Social History Narrative   Exercise-sometimes   Social Drivers of Health   Financial Resource Strain: Low Risk  (01/07/2024)   Overall Financial Resource Strain (CARDIA)    Difficulty of Paying Living Expenses: Not hard at all  Food Insecurity: No Food Insecurity (01/07/2024)   Hunger Vital Sign    Worried About Running Out of Food in the Last Year: Never true    Ran Out of Food in the Last Year: Never true  Transportation Needs: No Transportation Needs (01/07/2024)   PRAPARE -  Administrator, Civil Service (Medical): No    Lack of Transportation (Non-Medical): No  Physical Activity: Sufficiently Active (01/07/2024)   Exercise Vital Sign    Days of Exercise per Week: 5 days    Minutes of Exercise per Session: 60 min  Stress: No Stress Concern Present (01/07/2024)   [employer] of Occupational Health - Occupational Stress Questionnaire    Feeling of Stress : Not at all  Social Connections: Moderately Integrated (01/07/2024)   Social Connection and Isolation Panel [NHANES]    Frequency of Communication with Friends and Family: More than three times a week    Frequency of Social Gatherings with Friends and Family: Once a week    Attends Religious Services: More than 4 times per year    Active Member of [employer] or Organizations: No    Attends Banker Meetings: Never    Marital Status: Married  Catering manager Violence: Not At Risk (01/07/2024)   Humiliation, Afraid, Rape, and Kick questionnaire    Fear of Current or Ex-Partner: No    Emotionally Abused: No    Physically Abused: No    Sexually  Abused: No    Past Surgical History:  Procedure Laterality Date   ABDOMINAL HYSTERECTOMY     BREAST SURGERY     HARDWARE REMOVAL Left 01/08/2022   Procedure: HARDWARE REMOVAL;  Surgeon: Hardy Lia, MD;  Location[facility] OR;  Service: Orthopedics;  Laterality: Left;   NOSE SURGERY     ORIF ANKLE FRACTURE Left 07/23/2021   Procedure: OPEN REDUCTION INTERNAL FIXATION (ORIF) ANKLE FRACTURE;  Surgeon: Hardy Lia, MD;  Location: [facility] OR;  Service: Orthopedics;  Laterality: Left;   REVERSE SHOULDER ARTHROPLASTY Left 07/25/2021   Procedure: LEFT REVERSE SHOULDER ARTHROPLASTY;  Surgeon: Micheline Ahr, MD;  Location: [facility] OR;  Service: Orthopedics;  Laterality: Left;    Family History  Problem Relation Age of Onset   Heart disease Mother    Stroke Mother    Hypertension Mother    Cancer Mother        ovarian   Aneurysm Father    Hypertension  Father    Heart disease Father    Kidney disease Father    Heart disease Sister     Allergies  Allergen Reactions   Contrast Media [Iodinated Contrast Media] Hives   Iopamidol  Hives    After returning home pt called to inform technologist of rash to face and neck, denies difficulty breathing or swallowing, pt took 2 benedryl  Will need premeds in future  After returning home pt called to inform technologist of rash to face and neck, denies difficulty breathing or swallowing, pt took 2 benedryl, Will need premeds in future   Liraglutide  Other (See Comments)    Caused indigestion.   Amoxicillin  Other (See Comments)    Headache  Cephalosporin tolerant 07/23/2021  Other Reaction(s): Other (See Comments)   Belviq Xr [Lorcaserin  Hcl Er] Itching    Caused her face to turn red.   Saxenda  [Liraglutide  -Weight Management] Other (See Comments)    Caused indigestion.   Penicillins Rash    Cephalosporin Tolerant 07/23/2021     Current Outpatient Medications on File Prior to Visit  Medication Sig Dispense Refill   ALPRAZolam  (XANAX ) 0.5 MG tablet Take 1 tablet (0.5 mg total) by mouth 3 (three) times daily as needed for anxiety. 30 tablet 0   Camphor-Menthol -Methyl Sal (SALONPAS) 3.08-31-08 % PTCH Apply 5 patches topically daily.     cephALEXin  (KEFLEX ) 500 MG capsule Take 1 capsule (500 mg total) by mouth 2 (two) times daily. 14 capsule 0   cyclobenzaprine  (FLEXERIL ) 5 MG tablet Take 1 tablet (5 mg total) by mouth every 8 (eight) hours as needed. 30 tablet 2   diphenhydrAMINE  (BENADRYL ) 25 MG tablet Take 25 mg by mouth every 6 (six) hours as needed for allergies.     estradiol  (ESTRACE ) 0.1 MG/GM vaginal cream Place 1 Applicatorful vaginally 3 (three) times a week. 42.5 g 3   famotidine -calcium  carbonate-magnesium  hydroxide (PEPCID  COMPLETE) 10-800-165 MG chewable tablet Chew 1 tablet by mouth daily as needed (Heartburn).     fluticasone  (FLOVENT  HFA) 110 MCG/ACT inhaler Inhale 2 puffs into  the lungs as needed.     ibuprofen  (ADVIL ) 200 MG tablet Take 600 mg by mouth every 6 (six) hours as needed for mild pain or moderate pain.     levothyroxine  (SYNTHROID ) 88 MCG tablet Take 1 tablet (88 mcg total) by mouth daily before breakfast. 90 tablet 1   Multiple Vitamins-Minerals (MULTIVITAMIN WITH MINERALS) tablet Take 1 tablet by mouth daily. V thrive     olmesartan  (BENICAR ) 40 MG tablet TAKE 1 TABLET  BY MOUTH EVERY DAY 90 tablet 1   omeprazole (PRILOSEC) 40 MG capsule Take 40 mg by mouth daily.     OVER THE COUNTER MEDICATION Apply 1 application. topically daily as needed (Pain). Body Balm     pravastatin  (PRAVACHOL ) 20 MG tablet TAKE 1 TABLET BY MOUTH EVERY DAY 90 tablet 1   Probiotic Product (PROBIOTIC PO) Take 1 capsule by mouth daily.     VENTOLIN  HFA 108 (90 Base) MCG/ACT inhaler Inhale 2 puffs into the lungs every 4 (four) hours as needed for wheezing or shortness of breath. 18 g 1   Vitamin D , Ergocalciferol , (DRISDOL ) 1.25 MG (50000 UNIT) CAPS capsule TAKE 1 CAPSULE (50,000 UNITS TOTAL) BY MOUTH EVERY 7 (SEVEN) DAYS 12 capsule 1   Current Facility-Administered Medications on File Prior to Visit  Medication Dose Route Frequency Provider Last Rate Last Admin   denosumab  (PROLIA ) injection 60 mg  60 mg Subcutaneous Once Lowne Chase, Yvonne R, DO        BP 122/72   Pulse 76   Temp 98.2 F (36.8 C)   Resp 18   Ht 5' 6.5" (1.689 m)   Wt 165 lb 3.2 oz (74.9 kg)   SpO2 99%   BMI 26.26 kg/m         Objective:   Physical Exam  General Mental Status- Alert. General Appearance- Not in acute distress.   Skin General: Color- Normal Color. Moisture- Normal Moisture.  Neck Carotid Arteries- Normal color. Moisture- Normal Moisture. No carotid bruits. No JVD.  Chest and Lung Exam Auscultation: Breath Sounds:-Normal.  Cardiovascular Auscultation:Rythm- Regular. Murmurs & Other Heart Sounds:Auscultation of the heart reveals- No  Murmurs.  Abdomen Inspection:-Inspeection Normal. Palpation/Percussion:Note:No mass. Palpation and Percussion of the abdomen reveal- suprapubic Tender, Non Distended + BS, no rebound or guarding.   Neurologic Cranial Nerve exam:- CN III-XII intact(No nystagmus), symmetric smile. Strength:- 5/5 equal and symmetric strength both upper and lower extremities.   Heent- mild boggy turbinates. +pnd.   Back- no cva tenderness      Assessment & Plan:   Patient Instructions  Urinary Tract Infection (UTI). Probable. Dysuria and increased frequency suggest UTI. Urinalysis results obscured by Azo pills. Urine culture pending. Keflex  prescribed. May change antibiotic when C and S back.  - Initiate Keflex . - Await urine culture results to confirm diagnosis and adjust treatment if necessary. - Consider urology referral if symptoms persist.  Nasal congestion(probable allergic rhinitis) Nasal congestion, dryness, and postnasal drainage exacerbated by antihistamines. Discussed lower dose of fexofenadine to manage symptoms without excessive dryness. - Try children's fexofenadine 30 mg once daily. - Consider over-the-counter Equate brand for cost-effectiveness.  Follow up date to be determined after lab review   Sylvia Everts, PA-C

## 2024-01-30 ENCOUNTER — Ambulatory Visit: Payer: Self-pay | Admitting: Medical

## 2024-01-30 LAB — URINE CULTURE
MICRO NUMBER:: 16538250
SPECIMEN QUALITY:: ADEQUATE

## 2024-01-30 NOTE — Addendum Note (Signed)
 Addended by: Serafina Damme on: 01/30/2024 03:28 PM   Modules accepted: Orders

## 2024-02-02 ENCOUNTER — Other Ambulatory Visit: Payer: Self-pay

## 2024-02-02 ENCOUNTER — Encounter (HOSPITAL_BASED_OUTPATIENT_CLINIC_OR_DEPARTMENT_OTHER): Payer: Self-pay | Admitting: Emergency Medicine

## 2024-02-02 ENCOUNTER — Emergency Department (HOSPITAL_BASED_OUTPATIENT_CLINIC_OR_DEPARTMENT_OTHER)
Admission: EM | Admit: 2024-02-02 | Discharge: 2024-02-02 | Disposition: A | Discharge status: Home / Self Care | Attending: Emergency Medicine | Admitting: Emergency Medicine

## 2024-02-02 ENCOUNTER — Emergency Department (HOSPITAL_BASED_OUTPATIENT_CLINIC_OR_DEPARTMENT_OTHER)

## 2024-02-02 DIAGNOSIS — I10 Essential (primary) hypertension: Secondary | ICD-10-CM | POA: Diagnosis not present

## 2024-02-02 DIAGNOSIS — Z79899 Other long term (current) drug therapy: Secondary | ICD-10-CM | POA: Insufficient documentation

## 2024-02-02 DIAGNOSIS — R1084 Generalized abdominal pain: Secondary | ICD-10-CM

## 2024-02-02 DIAGNOSIS — R748 Abnormal levels of other serum enzymes: Secondary | ICD-10-CM | POA: Insufficient documentation

## 2024-02-02 DIAGNOSIS — K5900 Constipation, unspecified: Secondary | ICD-10-CM | POA: Diagnosis not present

## 2024-02-02 LAB — COMPREHENSIVE METABOLIC PANEL WITH GFR
ALT: 14 U/L (ref 0–44)
AST: 23 U/L (ref 15–41)
Albumin: 4.7 g/dL (ref 3.5–5.0)
Alkaline Phosphatase: 98 U/L (ref 38–126)
Anion gap: 13 (ref 5–15)
BUN: 18 mg/dL (ref 8–23)
CO2: 23 mmol/L (ref 22–32)
Calcium: 9.7 mg/dL (ref 8.9–10.3)
Chloride: 103 mmol/L (ref 98–111)
Creatinine, Ser: 1.03 mg/dL — ABNORMAL HIGH (ref 0.44–1.00)
GFR, Estimated: 59 mL/min — ABNORMAL LOW (ref >60)
Glucose, Bld: 108 mg/dL — ABNORMAL HIGH (ref 70–99)
Potassium: 4.1 mmol/L (ref 3.5–5.1)
Sodium: 139 mmol/L (ref 135–145)
Total Bilirubin: 0.2 mg/dL (ref 0.0–1.2)
Total Protein: 7.6 g/dL (ref 6.5–8.1)

## 2024-02-02 LAB — CBC
HCT: 42.1 % (ref 36.0–46.0)
Hemoglobin: 14.3 g/dL (ref 12.0–15.0)
MCH: 30 pg (ref 26.0–34.0)
MCHC: 34 g/dL (ref 30.0–36.0)
MCV: 88.3 fL (ref 80.0–100.0)
Platelets: 274 10*3/uL (ref 150–400)
RBC: 4.77 MIL/uL (ref 3.87–5.11)
RDW: 12.3 % (ref 11.5–15.5)
WBC: 8.9 10*3/uL (ref 4.0–10.5)
nRBC: 0 % (ref 0.0–0.2)

## 2024-02-02 LAB — LIPASE, BLOOD: Lipase: 71 U/L — ABNORMAL HIGH (ref 11–51)

## 2024-02-02 MED ORDER — DICYCLOMINE HCL 10 MG PO CAPS
10.0000 mg | ORAL_CAPSULE | Freq: Once | ORAL | Status: AC
  Administered 2024-02-02: 10 mg via ORAL
  Filled 2024-02-02: qty 1

## 2024-02-02 MED ORDER — DICYCLOMINE HCL 20 MG PO TABS: 20.0000 mg | ORAL_TABLET | Freq: Two times a day (BID) | ORAL | 0 refills | Status: DC

## 2024-02-02 NOTE — Discharge Instructions (Signed)
 As discussed, your imaging is overall reassuring.  No acute findings to indicate why you are having abdominal pain.  Eating does show a moderate amount of constipation, which could be the cause of the pain.  Take your laxatives as needed for additional pain relief.  Will send prescription of Bentyl to your pharmacy to take this twice a day as needed.  I have sent ambulatory referral for [facility] in [facility] for you to follow-up with for further evaluation of your abdominal pains.  Get help right away if: You cannot stop vomiting. Your pain is only in one part of your belly, like on the right side. You have bloody or black poop, or poop that looks like tar. You have trouble breathing. You have chest pain.

## 2024-02-02 NOTE — ED Provider Notes (Signed)
 [place] [facility] Provider Note   CSN: [id number] Arrival date & time: 02/02/24  1805     History  Chief Complaint  Patient presents with   Abdominal Pain    Kathryn Boyer is a 68 y.o. female with a history of GERD, thyroid  disease, and hypertension who presents the ED today for abdominal pain.  Patient reports generalized abdominal pain that began last night and worsened today.  She now reports that she has indigestion as well as intermittent episodes of back pain.  She has taken Tums and Gas-X without improvement of symptoms.  Pain is worse after eating.  Endorses associated nausea without vomiting.  No fevers.  Last bowel movement was today.  Is currently on Keflex  for UTI, but states that she did not have abdominal pain with her UTI when it started, just difficulty and pain with urination.    Home Medications Prior to Admission medications   Medication Sig Start Date End Date Taking? Authorizing Provider  dicyclomine (BENTYL) 20 MG tablet Take 1 tablet (20 mg total) by mouth 2 (two) times daily. 02/02/24 03/03/24 Yes Sonnie Dusky, PA-C  ALPRAZolam  (XANAX ) 0.5 MG tablet Take 1 tablet (0.5 mg total) by mouth 3 (three) times daily as needed for anxiety. 11/03/23   Lowne Chase, Yvonne R, DO  Camphor-Menthol -Methyl Sal (SALONPAS) 3.08-31-08 % PTCH Apply 5 patches topically daily.    [provider]  cephALEXin  (KEFLEX ) 500 MG capsule Take 1 capsule (500 mg total) by mouth 2 (two) times daily. 01/27/24   Estill Hemming, DO  cyclobenzaprine  (FLEXERIL ) 5 MG tablet Take 1 tablet (5 mg total) by mouth every 8 (eight) hours as needed. 11/03/23   Estill Hemming, DO  diphenhydrAMINE  (BENADRYL ) 25 MG tablet Take 25 mg by mouth every 6 (six) hours as needed for allergies.    [provider]  estradiol  (ESTRACE ) 0.1 MG/GM vaginal cream Place 1 Applicatorful vaginally 3 (three) times a week. 12/26/17   Estill Hemming, DO   famotidine -calcium  carbonate-magnesium  hydroxide (PEPCID  COMPLETE) 10-800-165 MG chewable tablet Chew 1 tablet by mouth daily as needed (Heartburn).    [provider]  fluticasone  (FLOVENT  HFA) 110 MCG/ACT inhaler Inhale 2 puffs into the lungs as needed. 10/12/19   [provider]  ibuprofen  (ADVIL ) 200 MG tablet Take 600 mg by mouth every 6 (six) hours as needed for mild pain or moderate pain.    [provider]  levothyroxine  (SYNTHROID ) 88 MCG tablet Take 1 tablet (88 mcg total) by mouth daily before breakfast. 11/06/23   Crecencio Dodge, Yvonne R, DO  Multiple Vitamins-Minerals (MULTIVITAMIN WITH MINERALS) tablet Take 1 tablet by mouth daily. V thrive    [provider]  olmesartan  (BENICAR ) 40 MG tablet TAKE 1 TABLET BY MOUTH EVERY DAY 11/17/23   Crecencio Dodge, Candida Chalk, DO  omeprazole (PRILOSEC) 40 MG capsule Take 40 mg by mouth daily.    [provider]  OVER THE COUNTER MEDICATION Apply 1 application. topically daily as needed (Pain). Body Balm    [provider]  pravastatin  (PRAVACHOL ) 20 MG tablet TAKE 1 TABLET BY MOUTH EVERY DAY 12/15/23   Crecencio Dodge, Yvonne R, DO  Probiotic Product (PROBIOTIC PO) Take 1 capsule by mouth daily.    [provider]  VENTOLIN  HFA 108 (90 Base) MCG/ACT inhaler Inhale 2 puffs into the lungs every 4 (four) hours as needed for wheezing or shortness of breath. 08/06/22   Adra Alanis,  FNP  Vitamin D , Ergocalciferol , (DRISDOL ) 1.25 MG (50000 UNIT) CAPS capsule TAKE 1 CAPSULE (50,000 UNITS TOTAL) BY MOUTH EVERY 7 (SEVEN) DAYS 12/15/23   Lowne Chase, Yvonne R, DO      Allergies    Contrast media [iodinated contrast media], Iopamidol , Liraglutide , Amoxicillin , Belviq xr [lorcaserin  hcl er], Saxenda  [liraglutide  -weight management], and Penicillins    Review of Systems   Review of Systems  Gastrointestinal:  Positive for abdominal pain.  All other systems reviewed and are negative.   Physical  Exam Updated Vital Signs BP (!) 149/79   Pulse 61   Temp 97.9 F (36.6 C) (Oral)   Resp 18   Wt 75 kg   SpO2 100%   BMI 26.29 kg/m  Physical Exam Vitals and nursing note reviewed.  Constitutional:      Appearance: Normal appearance.  HENT:     Head: Normocephalic and atraumatic.     Mouth/Throat:     Mouth: Mucous membranes are moist.  Eyes:     Conjunctiva/sclera: Conjunctivae normal.     Pupils: Pupils are equal, round, and reactive to light.  Cardiovascular:     Rate and Rhythm: Normal rate and regular rhythm.     Pulses: Normal pulses.     Heart sounds: Normal heart sounds.  Pulmonary:     Effort: Pulmonary effort is normal.     Breath sounds: Normal breath sounds.  Abdominal:     Palpations: Abdomen is soft.     Tenderness: There is abdominal tenderness. There is no right CVA tenderness.     Comments: Generalized abdominal tenderness without guarding or rebound. No distention.  Musculoskeletal:        General: Normal range of motion.     Cervical back: Normal range of motion.  Skin:    General: Skin is warm and dry.     Findings: No rash.  Neurological:     General: No focal deficit present.     Mental Status: She is alert.  Psychiatric:        Mood and Affect: Mood normal.        Behavior: Behavior normal.    ED Results / Procedures / Treatments   Labs (all labs ordered are listed, but only abnormal results are displayed) Labs Reviewed  LIPASE, BLOOD - Abnormal; Notable for the following components:      Result Value   Lipase 71 (*)    All other components within normal limits  COMPREHENSIVE METABOLIC PANEL WITH GFR - Abnormal; Notable for the following components:   Glucose, Bld 108 (*)    Creatinine, Ser 1.03 (*)    GFR, Estimated 59 (*)    All other components within normal limits  CBC    EKG None  Radiology CT ABDOMEN PELVIS WO CONTRAST Result Date: 02/02/2024 CLINICAL DATA:  Abdominal pain in the lower chest and back. Feels like  indigestion. EXAM: CT ABDOMEN AND PELVIS WITHOUT CONTRAST TECHNIQUE: Multidetector CT imaging of the abdomen and pelvis was performed following the standard protocol without IV contrast. RADIATION DOSE REDUCTION: This exam was performed according to the departmental dose-optimization program which includes automated exposure control, adjustment of the mA and/or kV according to patient size and/or use of iterative reconstruction technique. COMPARISON:  CT 03/03/2023 FINDINGS: Lower chest: No acute abnormality. Hepatobiliary: Hepatic steatosis. Normal gallbladder. No biliary dilation. Pancreas: Unremarkable. Spleen: Unremarkable. Adrenals/Urinary Tract: Normal adrenal glands. No urinary calculi or hydronephrosis. Bladder is unremarkable. Stomach/Bowel: Normal caliber large and small bowel. Extensive colonic diverticulosis.  No bowel wall thickening. The appendix is normal.Stomach is within normal limits. Vascular/Lymphatic: Aortic atherosclerosis. No enlarged abdominal or pelvic lymph nodes. Reproductive: Hysterectomy.  No adnexal mass. Other: No free intraperitoneal fluid or air. Musculoskeletal: No acute fracture. IMPRESSION: 1. No acute abnormality in the abdomen or pelvis. 2. Hepatic steatosis. 3. Extensive colonic diverticulosis without evidence of acute diverticulitis. 4. Aortic Atherosclerosis (ICD10-I70.0). Electronically Signed   By: Rozell Cornet M.D.   On: 02/02/2024 20:47    Procedures Procedures    Medications Ordered in ED Medications  dicyclomine (BENTYL) capsule 10 mg (10 mg Oral Given 02/02/24 2025)    ED Course/ Medical Decision Making/ A&P                                 Medical Decision Making Amount and/or Complexity of Data Reviewed Labs: ordered. Radiology: ordered.  Risk Prescription drug management.   This patient presents to the ED for concern of abdominal pain, this involves an extensive number of treatment options, and is a complaint that carries with it a high risk  of complications and morbidity.   Differential diagnosis includes: Enteritis, gastritis, IBS, IBD, pancreatitis, bowel obstruction, colitis, etc.   Comorbidities  See HPI above   Additional History  Additional history obtained from prior records   Lab Tests  I ordered and personally interpreted labs.  The pertinent results include:   Slightly elevated lipase of 71 CMP and CBC are reassuring   Imaging Studies  I ordered imaging studies including CT abdomen pelvis I independently visualized and interpreted imaging which showed:  No acute abnormality in the abdomen or pelvis.  Hepatic steatosis.  Extensive colonic diverticulosis without evidence of acute diverticulitis. I agree with the radiologist interpretation   Problem List / ED Course / Critical Interventions / Medication Management  Patient reports generalized abdominal pain that began yesterday.  Pain is worse after eating.  She endorses nausea without vomiting.  Pain also goes to her back and up to her chest, which happens when she gets indigestion.  Has taken Tums and Gas-X without relief. History of gallbladder issues in the past and is concerned she has a reoccurring issue with that now. Denies any shortness of breath.  Is not think pain is related to her heart but rather her abdomen. I ordered medications including: Bentyl for abdominal pain Reevaluation of the patient after these medicines showed that the patient improved Discussed findings with patient as well as incidental findings.  All questions answered.   Social Determinants of Health  Access to healthcare   Test / Admission - Considered  She is stable and safe for discharge home. Return precautions given.       Final Clinical Impression(s) / ED Diagnoses Final diagnoses:  Generalized abdominal pain  Constipation, unspecified constipation type    Rx / DC Orders ED Discharge Orders          Ordered    dicyclomine (BENTYL) 20 MG tablet   2 times daily        02/02/24 2208    Ambulatory referral to Gastroenterology        02/02/24 2208              Sonnie Dusky, PA-C 02/02/24 2213    Sallyanne Creamer, DO 02/05/24 2229

## 2024-02-02 NOTE — ED Triage Notes (Signed)
 Gl abd pain and indigestion , going today to her back and chest . Hx GERD . Burning pain . Nausea . No emesis .  Hx gallbladder issues .

## 2024-02-02 NOTE — ED Notes (Signed)
 Reviewed D/C information with the patient, pt verbalized understanding. No additional concerns at this time.

## 2024-02-10 ENCOUNTER — Telehealth: Payer: Self-pay

## 2024-02-10 NOTE — Telephone Encounter (Signed)
 Copied from CRM [id number]. Topic: Clinical - Request for Lab/Test Order >> Feb 10, 2024 10:42 AM Alyse July wrote: Reason for CRM: [facility] needs signed order faxed to  [phone] prior to patients Bone Density test which is scheduled for tomorrow  CB# [phone]

## 2024-02-10 NOTE — Telephone Encounter (Signed)
 Order just faxed back.

## 2024-02-11 LAB — HM DEXA SCAN

## 2024-02-12 ENCOUNTER — Encounter: Payer: Self-pay | Admitting: Family Medicine

## 2024-02-13 ENCOUNTER — Ambulatory Visit: Payer: Self-pay | Admitting: Family Medicine

## 2024-04-23 ENCOUNTER — Ambulatory Visit
Admission: RE | Admit: 2024-04-23 | Discharge: 2024-04-23 | Disposition: A | Discharge status: Home / Self Care | Attending: Family Medicine | Admitting: Family Medicine

## 2024-04-23 ENCOUNTER — Ambulatory Visit: Payer: Self-pay

## 2024-04-23 ENCOUNTER — Ambulatory Visit: Admitting: Medical-Surgical

## 2024-04-23 VITALS — BP 125/77 | HR 82 | Temp 98.1°F | Resp 18

## 2024-04-23 DIAGNOSIS — J0101 Acute recurrent maxillary sinusitis: Secondary | ICD-10-CM | POA: Diagnosis not present

## 2024-04-23 DIAGNOSIS — R300 Dysuria: Secondary | ICD-10-CM

## 2024-04-23 MED ORDER — CEFDINIR 300 MG PO CAPS: 300.0000 mg | ORAL_CAPSULE | Freq: Two times a day (BID) | ORAL | 0 refills | Status: DC

## 2024-04-23 NOTE — ED Triage Notes (Signed)
 Patient c/o HA's x 1 week, been treating with OTC sinus medication.  HA is more left side of face, sinus pain & pressure.  Taken Advil  and Zyrtec, nasal drainage has gotten better.  Also having some dizzy spells as well.

## 2024-04-23 NOTE — Telephone Encounter (Signed)
 FYI Only or Action Required?: FYI only for provider.  Patient was last seen in primary care on 01/28/2024 by Saguier, Edward, PA-C.  Called Nurse Triage reporting Sinusitis and Headache.  Symptoms began a week ago.  Interventions attempted: OTC medications: Zyrtec, Advil  and nasal spray.  Symptoms are: gradually worsening.  Triage Disposition: See HCP Within 4 Hours (Or PCP Triage)  Patient/caregiver understands and will follow disposition?: Yes                             Copied from CRM #[id number]. Topic: Clinical - Red Word Triage >> Apr 23, 2024 11:14 AM Rea ORN wrote: Red Word that prompted transfer to Nurse Triage: Severe headache for past week, possible sinus infection Reason for Disposition  [1] SEVERE sinus pain (e.g., excruciating) AND [2] not improved 2 hours after pain medicine  Answer Assessment - Initial Assessment Questions 1. LOCATION: Where does it hurt?      Left side of face behind eyes 2. ONSET: When did the sinus pain start?  (e.g., hours, days)      A week ago 4. RECURRENT SYMPTOM: Have you ever had sinus problems before? If Yes, ask: When was the last time? and What happened that time?      States she has nasal issues at baseline, states she has a nasal implant 5. NASAL CONGESTION: Is the nose blocked? If Yes, ask: Can you open it or must you breathe through your mouth?     States she is congested on left side of nose 6. NASAL DISCHARGE: Do you have discharge from your nose? If so ask, What color?     Denies 7. FEVER: Do you have a fever? If Yes, ask: What is it, how was it measured, and when did it start?      Denies 8. OTHER SYMPTOMS: Do you have any other symptoms? (e.g., sore throat, cough, earache, difficulty breathing)     Headaches cause strain on vision, dizziness and lightheadedness, denies chest pain, denies difficulty breathing, denies weakness, states most recent BP was 151/83, states she was recently  treated for a UTI and stopped taking antibiotic    Patient was scheduled at an office prior to transfer to this RN. Patient stated she is going to UC at this time, so she can be seen sooner. Appointment cancelled with patient's permission.  Protocols used: Sinus Pain or Congestion-A-AH

## 2024-04-23 NOTE — ED Provider Notes (Signed)
 [facility]    CSN: [id number] Arrival date & time: 04/23/24  1133      History   Chief Complaint Chief Complaint  Patient presents with   Headache    Entered by patient    HPI Kathryn Boyer is a 68 y.o. female.   Patient presents with two complaints: 1)  She complains of facial and frontal pain for one week with more pronounced left side sinus pain and pressure.  She has had mild dizzy spells, noting mild improvement of her symptoms with Advil  and Zyrtec.  She denies fevers, chills, and sweats, sore throat, and URI symptoms.  She has a distant history (30 years ago) of a nasal implant after an accident. 2)  She was treated for a UTI six days ago at a [facility] with Macrobid .  Her urine culture subsequently had only mixed flora.  However, she complains of persistent dysuria without pelvic/abdominal pain.  The history is provided by the patient.    Past Medical History:  Diagnosis Date   Abnormal weight gain 02/08/2015   Acute blood loss anemia    Acute cystitis without hematuria 08/10/2021   Acute maxillary sinusitis 02/01/2016   Acute non-recurrent pansinusitis 08/09/2022   Acute pain of right knee 05/25/2018   Acute recurrent ethmoidal sinusitis 11/29/2020   Last Assessment & Plan:    Sinus headaches.   She is having headaches in the face at least twice a week.  Denies colored rhinorrhea.  Some left-sided nasal congestion.  Some postnasal drainage throat awareness.   EXAM shows no intranasal polyps or purulence with a reasonable airway.  Oral cavity oropharynx is unremarkable.   PLAN: Discussed this is most likely migraine and reflux induced irrit   Adjustment disorder with anxiety 02/08/2015   Allergic rhinitis 10/13/2018   Allergic rhinitis due to pollen 02/01/2016   Anal skin tag 07/08/2023   Anxiety    Asthma    Asthma with acute exacerbation 02/01/2016   Bronchitis 08/09/2022   Chest discomfort, appears noncardiac with epigastric discomfort  06/23/2023   Chest pain 04/29/2019   Chronic low back pain 05/25/2018   Critical polytrauma 07/27/2021   Drug induced constipation    Dysuria 12/05/2022   Encounter for counseling 09/18/2016   Essential hypertension 02/01/2016   Fall 07/20/2021   Gastroesophageal reflux disease 10/13/2018   Generalized anxiety disorder 03/22/2017   High risk medication use 12/05/2022   History of hypertension    Humerus fracture 07/21/2021   Hyperlipidemia    Hypertension    Hypothyroidism 03/22/2017   Left trimalleolar fracture, closed, initial encounter 07/21/2021   Lumbar radiculopathy, acute 11/01/2015   Mild intermittent asthma with acute exacerbation 10/13/2018   Mitral regurgitation, mild on last echocardiogram October 2019 06/23/2023   Nondisplaced fracture of fifth metatarsal bone, right foot, initial encounter for closed fracture 07/21/2021   Nondisplaced fracture of lateral malleolus of right fibula, initial encounter for closed fracture 07/21/2021   Osteoporosis 12/20/2016   Osteoporosis, 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   Overweight (BMI 25.0-29.9) 04/23/2022   Plantar fasciitis    Post-menopausal 04/29/2019   Post-nasal drainage 08/28/2022   Postoperative pain    Pressure injury of skin of sacral region 08/10/2021   Preventative health care 10/26/2021   Right hip pain 06/01/2018   Thoracic aorta atherosclerosis (HCC) 05/25/2018   Thyroid  disease    Tibial plateau fracture, left 07/21/2021   Urinary frequency 04/29/2019   Urine frequency 04/29/2019   Vaginal dryness 02/06/2016    Patient Active Problem List  Diagnosis Date Noted   Anal skin tag 07/08/2023   Mitral regurgitation, mild on prior echocardiogram October 2019; trace on recent echo November 2024 06/23/2023   Chest discomfort, appears noncardiac with epigastric discomfort 06/23/2023   Anxiety    Asthma    Hypertension    Plantar fasciitis    Thyroid  disease    Dysuria 12/05/2022   High risk medication use  12/05/2022   Post-nasal drainage 08/28/2022   Bronchitis 08/09/2022   Acute non-recurrent pansinusitis 08/09/2022   Overweight (BMI 25.0-29.9) 04/23/2022   Preventative health care 10/26/2021   Pressure injury of skin of sacral region 08/10/2021   Acute cystitis without hematuria 08/10/2021   Acute blood loss anemia    History of hypertension    Drug induced constipation    Postoperative pain    Critical polytrauma 07/27/2021   Humerus fracture 07/21/2021   Tibial plateau fracture, left 07/21/2021   Nondisplaced fracture of fifth metatarsal bone, right foot, initial encounter for closed fracture 07/21/2021   Nondisplaced fracture of lateral malleolus of right fibula, initial encounter for closed fracture 07/21/2021   Left trimalleolar fracture, closed, initial encounter 07/21/2021   Fall 07/20/2021   Acute recurrent ethmoidal sinusitis 11/29/2020   Urine frequency 04/29/2019   Chest pain 04/29/2019   Urinary frequency 04/29/2019   Post-menopausal 04/29/2019   Allergic rhinitis 10/13/2018   Mild intermittent asthma with acute exacerbation 10/13/2018   Gastroesophageal reflux disease 10/13/2018   Right hip pain 06/01/2018   Chronic low back pain 05/25/2018   Acute pain of right knee 05/25/2018   Thoracic aorta atherosclerosis (HCC) 05/25/2018   Generalized anxiety disorder 03/22/2017   Hyperlipidemia 03/22/2017   Hypothyroidism 03/22/2017   Osteoporosis 12/20/2016   Encounter for counseling 09/18/2016   Vaginal dryness 02/06/2016   Allergic rhinitis due to pollen 02/01/2016   Acute maxillary sinusitis 02/01/2016   Essential hypertension 02/01/2016   Asthma with acute exacerbation 02/01/2016   Lumbar radiculopathy, acute 11/01/2015   Osteoporosis, 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   Adjustment disorder with anxiety 02/08/2015   Abnormal weight gain 02/08/2015    Past Surgical History:  Procedure Laterality Date   ABDOMINAL HYSTERECTOMY     BREAST SURGERY     HARDWARE REMOVAL  Left 01/08/2022   Procedure: HARDWARE REMOVAL;  Surgeon: Celena Sharper, MD;  Location: [facility];  Service: Orthopedics;  Laterality: Left;   NOSE SURGERY     ORIF ANKLE FRACTURE Left 07/23/2021   Procedure: OPEN REDUCTION INTERNAL FIXATION (ORIF) ANKLE FRACTURE;  Surgeon: Celena Sharper, MD;  Location: [facility];  Service: Orthopedics;  Laterality: Left;   REVERSE SHOULDER ARTHROPLASTY Left 07/25/2021   Procedure: LEFT REVERSE SHOULDER ARTHROPLASTY;  Surgeon: Cristy Bonner DASEN, MD;  Location: [facility] OR;  Service: Orthopedics;  Laterality: Left;    OB History   No obstetric history on file.      Home Medications    Prior to Admission medications   Medication Sig Start Date End Date Taking? Authorizing Provider  ALPRAZolam  (XANAX ) 0.5 MG tablet Take 1 tablet (0.5 mg total) by mouth 3 (three) times daily as needed for anxiety. 11/03/23  Yes Antonio Cyndee Rockers R, DO  Camphor-Menthol -Methyl Sal (SALONPAS) 3.08-31-08 % PTCH Apply 5 patches topically daily.   Yes [provider]  cefdinir  (OMNICEF ) 300 MG capsule Take 1 capsule (300 mg total) by mouth 2 (two) times daily. 04/23/24  Yes Pauline Garnette LABOR, MD  estradiol  (ESTRACE ) 0.1 MG/GM vaginal cream Place 1 Applicatorful vaginally 3 (three) times a week. 12/26/17  Yes Lowne  Cyndee Jamee SAUNDERS, DO  famotidine -calcium  carbonate-magnesium  hydroxide (PEPCID  COMPLETE) 10-800-165 MG chewable tablet Chew 1 tablet by mouth daily as needed (Heartburn).   Yes [provider]  fluticasone  (FLOVENT  HFA) 110 MCG/ACT inhaler Inhale 2 puffs into the lungs as needed. 10/12/19  Yes [provider]  ibuprofen  (ADVIL ) 200 MG tablet Take 600 mg by mouth every 6 (six) hours as needed for mild pain or moderate pain.   Yes [provider]  levothyroxine  (SYNTHROID ) 88 MCG tablet Take 1 tablet (88 mcg total) by mouth daily before breakfast. 11/06/23  Yes Lowne Chase, Yvonne R, DO  Multiple Vitamins-Minerals (MULTIVITAMIN WITH MINERALS) tablet Take 1 tablet  by mouth daily. V thrive   Yes [provider]  olmesartan  (BENICAR ) 40 MG tablet TAKE 1 TABLET BY MOUTH EVERY DAY 11/17/23  Yes Antonio Cyndee Jamee R, DO  omeprazole (PRILOSEC) 40 MG capsule Take 40 mg by mouth daily.   Yes [provider]  OVER THE COUNTER MEDICATION Apply 1 application. topically daily as needed (Pain). Body Balm   Yes [provider]  pravastatin  (PRAVACHOL ) 20 MG tablet TAKE 1 TABLET BY MOUTH EVERY DAY 12/15/23  Yes Lowne Chase, Yvonne R, DO  Probiotic Product (PROBIOTIC PO) Take 1 capsule by mouth daily.   Yes [provider]  VENTOLIN  HFA 108 (90 Base) MCG/ACT inhaler Inhale 2 puffs into the lungs every 4 (four) hours as needed for wheezing or shortness of breath. 08/06/22  Yes Jason Leita Repine, FNP  Vitamin D , Ergocalciferol , (DRISDOL ) 1.25 MG (50000 UNIT) CAPS capsule TAKE 1 CAPSULE (50,000 UNITS TOTAL) BY MOUTH EVERY 7 (SEVEN) DAYS 12/15/23  Yes Lowne Chase, Yvonne R, DO  cyclobenzaprine  (FLEXERIL ) 5 MG tablet Take 1 tablet (5 mg total) by mouth every 8 (eight) hours as needed. 11/03/23   Antonio Cyndee Jamee SAUNDERS, DO  dicyclomine  (BENTYL ) 20 MG tablet Take 1 tablet (20 mg total) by mouth 2 (two) times daily. 02/02/24 03/03/24  Waddell Sluder, PA-C  diphenhydrAMINE  (BENADRYL ) 25 MG tablet Take 25 mg by mouth every 6 (six) hours as needed for allergies.    [provider]    Family History Family History  Problem Relation Age of Onset   Heart disease Mother    Stroke Mother    Hypertension Mother    Cancer Mother        ovarian   Aneurysm Father    Hypertension Father    Heart disease Father    Kidney disease Father    Heart disease Sister     Social History Social History   Tobacco Use   Smoking status: Never   Smokeless tobacco: Never  Vaping Use   Vaping status: Never Used  Substance Use Topics   Alcohol use: No   Drug use: No     Allergies   Contrast media [iodinated contrast media], Iopamidol , Liraglutide ,  Amoxicillin , Belviq xr [lorcaserin  hcl er], Saxenda  [liraglutide  -weight management], and Penicillins   Review of Systems Review of Systems  Constitutional:  Negative for activity change, appetite change, chills, diaphoresis, fatigue and fever.  HENT:  Positive for congestion, postnasal drip, sinus pressure and sinus pain. Negative for ear pain, facial swelling and sore throat.   Eyes: Negative.   Respiratory: Negative.    Cardiovascular: Negative.   Gastrointestinal: Negative.   Genitourinary:  Positive for dysuria. Negative for flank pain, frequency, hematuria, pelvic pain and urgency.  All other systems reviewed and are negative.    Physical Exam Triage Vital Signs ED  Triage Vitals  Encounter Vitals Group     BP 04/23/24 1152 119/70     Girls Systolic BP Percentile --      Girls Diastolic BP Percentile --      Boys Systolic BP Percentile --      Boys Diastolic BP Percentile --      Pulse Rate 04/23/24 1152 82     Resp 04/23/24 1152 18     Temp 04/23/24 1152 98.1 F (36.7 C)     Temp Source 04/23/24 1152 Oral     SpO2 04/23/24 1152 95 %     Weight --      Height --      Head Circumference --      Peak Flow --      Pain Score 04/23/24 1153 7     Pain Loc --      Pain Education --      Exclude from Growth Chart --    No data found.  Updated Vital Signs BP 125/77 (BP Location: Right Arm)   Pulse 82   Temp 98.1 F (36.7 C) (Oral)   Resp 18   SpO2 95%   Visual Acuity Right Eye Distance:   Left Eye Distance:   Bilateral Distance:    Right Eye Near:   Left Eye Near:    Bilateral Near:     Physical Exam Nursing notes and Vital Signs reviewed. Appearance:  Patient appears stated age, and in no acute distress Eyes:  Pupils are equal, round, and reactive to light and accomodation.  Extraocular movement is intact.  Conjunctivae are not inflamed  Ears:  Canals normal.  Tympanic membranes normal.  Nose:  Congested turbinates.  Maxillary sinus tenderness is present.   Pharynx:  Normal Neck:  Supple. No adenopathy.  Lungs:  Clear to auscultation.  Breath sounds are equal.  Moving air well. Heart:  Regular rate and rhythm without murmurs, rubs, or gallops.  Abdomen:  Nontender without masses or hepatosplenomegaly.  Bowel sounds are present.  No CVA or flank tenderness.  Extremities:  No edema.  Skin:  No rash present.   UC Treatments / Results  Labs (all labs ordered are listed, but only abnormal results are displayed) Labs Reviewed  URINE CULTURE    EKG   Radiology No results found.  Procedures Procedures (including critical care time)  Medications Ordered in UC Medications - No data to display  Initial Impression / Assessment and Plan / UC Course  I have reviewed the triage vital signs and the nursing notes.  Pertinent labs & imaging results that were available during my care of the patient were reviewed by me and considered in my medical decision making (see chart for details).    Begin Omnicef  300mg  BID for sinusitis and possible UTI Urine culture pending. Followup with Family Doctor if not improved in one week.   Final Clinical Impressions(s) / UC Diagnoses   Final diagnoses:  Acute recurrent maxillary sinusitis  Dysuria     Discharge Instructions      Increase fluid intake.  Continue daily saline nasal spray and/or gel.    ED Prescriptions     Medication Sig Dispense Auth. Provider   cefdinir  (OMNICEF ) 300 MG capsule Take 1 capsule (300 mg total) by mouth 2 (two) times daily. 14 capsule Pauline Garnette LABOR, MD         Pauline Garnette LABOR, MD 04/25/24 [phone]

## 2024-04-23 NOTE — Discharge Instructions (Signed)
 Increase fluid intake.  Continue daily saline nasal spray and/or gel.

## 2024-04-24 LAB — URINE CULTURE
Culture: <10000 — AB
Special Requests: NORMAL

## 2024-04-27 ENCOUNTER — Ambulatory Visit (HOSPITAL_COMMUNITY): Payer: Self-pay

## 2024-04-29 ENCOUNTER — Other Ambulatory Visit: Payer: Self-pay | Admitting: Family Medicine

## 2024-04-29 DIAGNOSIS — E039 Hypothyroidism, unspecified: Secondary | ICD-10-CM

## 2024-05-05 ENCOUNTER — Other Ambulatory Visit: Payer: Self-pay | Admitting: Family Medicine

## 2024-05-05 DIAGNOSIS — I10 Essential (primary) hypertension: Secondary | ICD-10-CM

## 2024-05-06 ENCOUNTER — Encounter: Admitting: Family Medicine

## 2024-05-10 ENCOUNTER — Ambulatory Visit: Admitting: Family Medicine

## 2024-05-10 ENCOUNTER — Ambulatory Visit (INDEPENDENT_AMBULATORY_CARE_PROVIDER_SITE_OTHER): Admitting: Family Medicine

## 2024-05-10 ENCOUNTER — Ambulatory Visit: Payer: Self-pay

## 2024-05-10 ENCOUNTER — Encounter: Payer: Self-pay | Admitting: Family Medicine

## 2024-05-10 VITALS — Ht 66.5 in | Wt 169.0 lb

## 2024-05-10 DIAGNOSIS — J0140 Acute pansinusitis, unspecified: Secondary | ICD-10-CM | POA: Diagnosis not present

## 2024-05-10 DIAGNOSIS — R42 Dizziness and giddiness: Secondary | ICD-10-CM | POA: Diagnosis not present

## 2024-05-10 MED ORDER — MECLIZINE HCL 25 MG PO TABS: 25.0000 mg | ORAL_TABLET | Freq: Three times a day (TID) | ORAL | 0 refills | Status: AC | PRN

## 2024-05-10 MED ORDER — PREDNISONE 20 MG PO TABS: 20.0000 mg | ORAL_TABLET | Freq: Two times a day (BID) | ORAL | 0 refills | Status: DC

## 2024-05-10 MED ORDER — DOXYCYCLINE HYCLATE 100 MG PO TABS: 100.0000 mg | ORAL_TABLET | Freq: Two times a day (BID) | ORAL | 0 refills | Status: AC

## 2024-05-10 NOTE — Telephone Encounter (Signed)
 Pt has scheduled OV.

## 2024-05-10 NOTE — Progress Notes (Signed)
 Kathryn Boyer - 68 y.o. female MRN [id number]  Date of birth: Dec 01, 1955  Subjective Chief Complaint  Patient presents with   Dizziness    HPI Kathryn Boyer is a 68 year old female here today with complaint of dizziness.  Reports this started a [place] the plane the world few days ago.  Describes sensation of room spinning.  This is worse when moving her head too quickly or leaning down and standing up.  She does not feel like she is going to pass out.  She is concerned about possible cardiac issue contributing to her symptoms.  She denies chest pain, shortness of breath or palpitations.  She has had some sinus pain, headache and congestion.  She was seen at urgent care recently and prescribed antibiotics for UTI symptoms and sinusitis.  She never started antibiotics due to concerns about side effects.  Urine culture did return without growth.  She has been using Afrin daily over the past week.  She has not had any fevers or chills.  She does have a little nausea related to her dizziness.  ROS:  A comprehensive ROS was completed and negative except as noted per HPI  Allergies  Allergen Reactions   Contrast Media [Iodinated Contrast Media] Hives   Iopamidol  Hives    After returning home pt called to inform technologist of rash to face and neck, denies difficulty breathing or swallowing, pt took 2 benedryl  Will need premeds in future  After returning home pt called to inform technologist of rash to face and neck, denies difficulty breathing or swallowing, pt took 2 benedryl, Will need premeds in future   Liraglutide  Other (See Comments)    Caused indigestion.   Amoxicillin  Other (See Comments)    Headache  Cephalosporin tolerant 07/23/2021  Other Reaction(s): Other (See Comments)   Belviq Xr [Lorcaserin  Hcl Er] Itching    Caused her face to turn red.   Saxenda  [Liraglutide  -Weight Management] Other (See Comments)    Caused indigestion.   Penicillins Rash    Cephalosporin Tolerant  07/23/2021     Past Medical History:  Diagnosis Date   Abnormal weight gain 02/08/2015   Acute blood loss anemia    Acute cystitis without hematuria 08/10/2021   Acute maxillary sinusitis 02/01/2016   Acute non-recurrent pansinusitis 08/09/2022   Acute pain of right knee 05/25/2018   Acute recurrent ethmoidal sinusitis 11/29/2020   Last Assessment & Plan:    Sinus headaches.   She is having headaches in the face at least twice a week.  Denies colored rhinorrhea.  Some left-sided nasal congestion.  Some postnasal drainage throat awareness.   EXAM shows no intranasal polyps or purulence with a reasonable airway.  Oral cavity oropharynx is unremarkable.   PLAN: Discussed this is most likely migraine and reflux induced irrit   Adjustment disorder with anxiety 02/08/2015   Allergic rhinitis 10/13/2018   Allergic rhinitis due to pollen 02/01/2016   Anal skin tag 07/08/2023   Anxiety    Asthma    Asthma with acute exacerbation 02/01/2016   Bronchitis 08/09/2022   Chest discomfort, appears noncardiac with epigastric discomfort 06/23/2023   Chest pain 04/29/2019   Chronic low back pain 05/25/2018   Critical polytrauma 07/27/2021   Drug induced constipation    Dysuria 12/05/2022   Encounter for counseling 09/18/2016   Essential hypertension 02/01/2016   Fall 07/20/2021   Gastroesophageal reflux disease 10/13/2018   Generalized anxiety disorder 03/22/2017   High risk medication use 12/05/2022   History of hypertension  Humerus fracture 07/21/2021   Hyperlipidemia    Hypertension    Hypothyroidism 03/22/2017   Left trimalleolar fracture, closed, initial encounter 07/21/2021   Lumbar radiculopathy, acute 11/01/2015   Mild intermittent asthma with acute exacerbation 10/13/2018   Mitral regurgitation, mild on last echocardiogram October 2019 06/23/2023   Nondisplaced fracture of fifth metatarsal bone, right foot, initial encounter for closed fracture 07/21/2021   Nondisplaced  fracture of lateral malleolus of right fibula, initial encounter for closed fracture 07/21/2021   Osteoporosis 12/20/2016   Osteoporosis, 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   Overweight (BMI 25.0-29.9) 04/23/2022   Plantar fasciitis    Post-menopausal 04/29/2019   Post-nasal drainage 08/28/2022   Postoperative pain    Pressure injury of skin of sacral region 08/10/2021   Preventative health care 10/26/2021   Right hip pain 06/01/2018   Thoracic aorta atherosclerosis (HCC) 05/25/2018   Thyroid  disease    Tibial plateau fracture, left 07/21/2021   Urinary frequency 04/29/2019   Urine frequency 04/29/2019   Vaginal dryness 02/06/2016    Past Surgical History:  Procedure Laterality Date   ABDOMINAL HYSTERECTOMY     BREAST SURGERY     HARDWARE REMOVAL Left 01/08/2022   Procedure: HARDWARE REMOVAL;  Surgeon: Celena Sharper, MD;  Location: [facility] OR;  Service: Orthopedics;  Laterality: Left;   NOSE SURGERY     ORIF ANKLE FRACTURE Left 07/23/2021   Procedure: OPEN REDUCTION INTERNAL FIXATION (ORIF) ANKLE FRACTURE;  Surgeon: Celena Sharper, MD;  Location: [facility] OR;  Service: Orthopedics;  Laterality: Left;   REVERSE SHOULDER ARTHROPLASTY Left 07/25/2021   Procedure: LEFT REVERSE SHOULDER ARTHROPLASTY;  Surgeon: Cristy Bonner DASEN, MD;  Location: [facility] OR;  Service: Orthopedics;  Laterality: Left;    Social History   Socioeconomic History   Marital status: Married    Spouse name: Not on file   Number of children: Not on file   Years of education: Not on file   Highest education level: Not on file  Occupational History   Not on file  Tobacco Use   Smoking status: Never   Smokeless tobacco: Never  Vaping Use   Vaping status: Never Used  Substance and Sexual Activity   Alcohol use: No   Drug use: No   Sexual activity: Not Currently    Birth control/protection: None  Other Topics Concern   Not on file  Social History Narrative   Exercise-sometimes   Social Drivers of Health   Financial Resource  Strain: Low Risk  (01/07/2024)   Overall Financial Resource Strain (CARDIA)    Difficulty of Paying Living Expenses: Not hard at all  Food Insecurity: No Food Insecurity (01/07/2024)   Hunger Vital Sign    Worried About Running Out of Food in the Last Year: Never true    Ran Out of Food in the Last Year: Never true  Transportation Needs: No Transportation Needs (01/07/2024)   PRAPARE - Administrator, Civil Service (Medical): No    Lack of Transportation (Non-Medical): No  Physical Activity: Sufficiently Active (01/07/2024)   Exercise Vital Sign    Days of Exercise per Week: 5 days    Minutes of Exercise per Session: 60 min  Stress: No Stress Concern Present (01/07/2024)   [employer] of Occupational Health - Occupational Stress Questionnaire    Feeling of Stress : Not at all  Social Connections: Moderately Integrated (01/07/2024)   Social Connection and Isolation Panel    Frequency of Communication with Friends and Family: More than three times a week  Frequency of Social Gatherings with Friends and Family: Once a week    Attends Religious Services: More than 4 times per year    Active Member of [employer] or Organizations: No    Attends Engineer, structural: Never    Marital Status: Married    Family History  Problem Relation Age of Onset   Heart disease Mother    Stroke Mother    Hypertension Mother    Cancer Mother        ovarian   Aneurysm Father    Hypertension Father    Heart disease Father    Kidney disease Father    Heart disease Sister     Health Maintenance  Topic Date Due   Zoster Vaccines- Shingrix  (2 of 2) 07/13/2019   Influenza Vaccine  03/26/2024   COVID-19 Vaccine (4 - 2025-26 season) 04/26/2024   Mammogram  04/30/2024   Medicare Annual Wellness (AWV)  01/06/2025   Colonoscopy  08/16/2030   DTaP/Tdap/Td (4 - Td or Tdap) 05/04/2033   Pneumococcal Vaccine: 50+ Years  Completed   DEXA SCAN  Completed   Hepatitis C Screening   Completed   HPV VACCINES  Aged Out   Meningococcal B Vaccine  Aged Out     ----------------------------------------------------------------------------------------------------------------------------------------------------------------------------------------------------------------- Physical Exam Ht 5' 6.5 (1.689 m)   Wt 169 lb (76.7 kg)   SpO2 97%   BMI 26.87 kg/m   Physical Exam Constitutional:      Appearance: Normal appearance.  HENT:     Head: Normocephalic and atraumatic.     Right Ear: Tympanic membrane normal.     Left Ear: Tympanic membrane normal.  Eyes:     General: No scleral icterus. Cardiovascular:     Rate and Rhythm: Normal rate and regular rhythm.  Pulmonary:     Effort: Pulmonary effort is normal.     Breath sounds: Normal breath sounds.  Neurological:     General: No focal deficit present.     Mental Status: She is alert and oriented to person, place, and time.     Comments: Positive Dix-Hallpike to the left.  Psychiatric:        Mood and Affect: Mood normal.        Behavior: Behavior normal.    EKG: normal EKG, normal sinus rhythm.  ------------------------------------------------------------------------------------------------------------------------------------------------------------------------------------------------------------------- Assessment and Plan  Vertigo She is experiencing some vertigo symptoms.  Possibly related to her sinusitis with possible labyrinthitis.  Adding prednisone  burst with meclizine  as needed.    Acute non-recurrent pansinusitis And course of doxycycline  as well as prednisone  burst.  We discussed limiting use of Afrin to prevent rebound symptoms.  Red flags reviewed.   Meds ordered this encounter  Medications   predniSONE  (DELTASONE ) 20 MG tablet    Sig: Take 1 tablet (20 mg total) by mouth 2 (two) times daily with a meal.    Dispense:  10 tablet    Refill:  0   doxycycline  (VIBRA -TABS) 100 MG tablet    Sig:  Take 1 tablet (100 mg total) by mouth 2 (two) times daily for 10 days.    Dispense:  20 tablet    Refill:  0   meclizine  (ANTIVERT ) 25 MG tablet    Sig: Take 1 tablet (25 mg total) by mouth 3 (three) times daily as needed for dizziness.    Dispense:  30 tablet    Refill:  0    No follow-ups on file.

## 2024-05-10 NOTE — Assessment & Plan Note (Signed)
 And course of doxycycline  as well as prednisone  burst.  We discussed limiting use of Afrin to prevent rebound symptoms.  Red flags reviewed.

## 2024-05-10 NOTE — Assessment & Plan Note (Signed)
 She is experiencing some vertigo symptoms.  Possibly related to her sinusitis with possible labyrinthitis.  Adding prednisone  burst with meclizine  as needed.

## 2024-05-10 NOTE — Telephone Encounter (Signed)
 FYI Only or Action Required?: FYI only for provider.  Patient was last seen in primary care on 01/28/2024 by Saguier, Edward, PA-C.  Called Nurse Triage reporting Dizziness.  Symptoms began several weeks ago.  Interventions attempted: Rest, hydration, or home remedies.  Symptoms are: unchanged.  Triage Disposition: See HCP Within 4 Hours (Or PCP Triage)  Patient/caregiver understands and will follow disposition?: Yes        Copied from CRM #[id number]. Topic: Clinical - Red Word Triage >> May 10, 2024  9:22 AM Roselie BROCKS wrote: [facility] that prompted transfer to Nurse Triage: Patient states she is very dizzy and need to speak to someone Reason for Disposition  [1] Dizziness caused by heat exposure, sudden standing, or poor fluid intake AND [2] no improvement after 2 hours of rest and fluids  Answer Assessment - Initial Assessment Questions 1. DESCRIPTION: Describe your dizziness.     Dizzy and a tad nauseated Endorses recent PC for abx d/t urinary burning - endorses did not complete Rx 2. LIGHTHEADED: Do you feel lightheaded? (e.g., somewhat faint, woozy, weak upon standing)     Sometimes when I stand up, I'm just swimming 3. VERTIGO: Do you feel like either you or the room is spinning or tilting? (i.e., vertigo)     denies 4. SEVERITY: How bad is it?  Do you feel like you are going to faint? Can you stand and walk?     If I bend over I'm dizzy 5. ONSET:  When did the dizziness begin?     2-3 weeks 6. AGGRAVATING FACTORS: Does anything make it worse? (e.g., standing, change in head position)     Bending over, sometimes standing 7. HEART RATE: Can you tell me your heart rate? How many beats in 15 seconds?  (Note: Not all patients can do this.)       N/a 8. CAUSE: What do you think is causing the dizziness? (e.g., decreased fluids or food, diarrhea, emotional distress, heat exposure, new medicine, sudden standing, vomiting; unknown)     unknown 9.  RECURRENT SYMPTOM: Have you had dizziness before? If Yes, ask: When was the last time? What happened that time?     N/a 10. OTHER SYMPTOMS: Do you have any other symptoms? (e.g., fever, chest pain, vomiting, diarrhea, bleeding)       Occasional feeling of diarrhea Denies other sx 11. PREGNANCY: Is there any chance you are pregnant? When was your last menstrual period?       N/a    Pt requesting sooner appt time at [facility]. Scheduled with alternate [facility] as requested.  Protocols used: Dizziness - Lightheadedness-A-AH

## 2024-05-10 NOTE — Patient Instructions (Addendum)
 Avoid using decongestant spray.  Use nasal saline spray or Gel ([place]yr) Start prednisone  and doxycycline .  Try meclizine  as needed for vertigo/dizziness Follow up with PCP in 3-4 weeks.    Vertigo Vertigo is the feeling that you or the things around you are moving or spinning when they're not. It's different than feeling dizzy. It can also cause: Loss of balance. Trouble standing or walking. Nausea and vomiting. This feeling can come and go at any time. It can last from a few seconds to minutes or even hours. It may go away on its own or be treated with medicine. What are the types of vertigo? There are two types of vertigo: Peripheral vertigo happens when parts of your inner ear don't work like they should. This is the more common type. Central vertigo happens when your brain and spinal cord don't work like they should. Your health care provider will do tests to find out what kind of vertigo you have. This will help them decide on the right treatment for you. Follow these instructions at home: Eating and drinking Drink enough fluid to keep your pee (urine) pale yellow. Do not drink alcohol. Activity When you get up in the morning, first sit up on the side of the bed. When you feel okay, stand slowly while holding onto something. Move slowly. Avoid sudden body or head movements. Avoid certain positions, as told by your provider. Use a cane if you have trouble standing or walking. Sit down right away if you feel unsteady. Place items in your home so they're easy for you to reach without bending or leaning over. Return to normal activities when you're told. Ask what things are safe for you to do. General instructions Take your medicines only as told by your provider. Contact a health care provider if: Your medicines don't help or make your vertigo worse. You get new symptoms. You have a fever. You have nausea or vomiting. Your family or friends spot any changes in how you're  acting. A part of your body goes numb. You feel tingling and prickling in a part of your body. You get very bad headaches. Get help right away if: You're always dizzy or you faint. You have a stiff neck. You have trouble moving or speaking. Your hands, arms, or legs feel weak. Your hearing or eyesight changes. These symptoms may be an emergency. Call 911 right away. Do not wait to see if the symptoms will go away. Do not drive yourself to the hospital. This information is not intended to replace advice given to you by your health care provider. Make sure you discuss any questions you have with your health care provider. Document Revised: 05/15/2023 Document Reviewed: 11/15/2022 Elsevier Patient Education  2024 [employer].

## 2024-05-11 ENCOUNTER — Ambulatory Visit: Payer: Self-pay

## 2024-05-11 ENCOUNTER — Ambulatory Visit: Payer: Self-pay | Admitting: Family Medicine

## 2024-05-11 LAB — CBC WITH DIFFERENTIAL/PLATELET
Basophils Absolute: 0.1 x10E3/uL (ref 0.0–0.2)
Basos: 1 %
EOS (ABSOLUTE): 0.1 x10E3/uL (ref 0.0–0.4)
Eos: 1 %
Hematocrit: 45.8 % (ref 34.0–46.6)
Hemoglobin: 15.3 g/dL (ref 11.1–15.9)
Immature Grans (Abs): 0 x10E3/uL (ref 0.0–0.1)
Immature Granulocytes: 0 %
Lymphocytes Absolute: 2.2 x10E3/uL (ref 0.7–3.1)
Lymphs: 32 %
MCH: 30.9 pg (ref 26.6–33.0)
MCHC: 33.4 g/dL (ref 31.5–35.7)
MCV: 93 fL (ref 79–97)
Monocytes Absolute: 0.4 x10E3/uL (ref 0.1–0.9)
Monocytes: 6 %
Neutrophils Absolute: 4.2 x10E3/uL (ref 1.4–7.0)
Neutrophils: 60 %
Platelets: 262 x10E3/uL (ref 150–450)
RBC: 4.95 x10E6/uL (ref 3.77–5.28)
RDW: 12.9 % (ref 11.7–15.4)
WBC: 7.1 x10E3/uL (ref 3.4–10.8)

## 2024-05-11 LAB — CMP14+EGFR
ALT: 20 IU/L (ref 0–32)
AST: 26 IU/L (ref 0–40)
Albumin: 4.7 g/dL (ref 3.9–4.9)
Alkaline Phosphatase: 93 IU/L (ref 49–135)
BUN/Creatinine Ratio: 12 (ref 12–28)
BUN: 10 mg/dL (ref 8–27)
Bilirubin Total: 0.5 mg/dL (ref 0.0–1.2)
CO2: 21 mmol/L (ref 20–29)
Calcium: 9.9 mg/dL (ref 8.7–10.3)
Chloride: 104 mmol/L (ref 96–106)
Creatinine, Ser: 0.85 mg/dL (ref 0.57–1.00)
Globulin, Total: 2.8 g/dL (ref 1.5–4.5)
Glucose: 86 mg/dL (ref 70–99)
Potassium: 4.6 mmol/L (ref 3.5–5.2)
Sodium: 141 mmol/L (ref 134–144)
Total Protein: 7.5 g/dL (ref 6.0–8.5)
eGFR: 74 mL/min/1.73 (ref >59)

## 2024-05-11 NOTE — Telephone Encounter (Signed)
 Please respond to patient via MyChart as she has a spam blocker on her phone and may block your call.  FYI Only or Action Required?: Action required by provider: clinical question for provider.  Patient was last seen in primary care on 05/10/2024 by Alvia Bring, DO.  Called Nurse Triage reporting Medication Reaction.  Symptoms began yesterday.  Interventions attempted: Rest, hydration, or home remedies.  Symptoms are: gradually improving.  Triage Disposition: Discuss With PCP and Callback by Nurse Today (overriding Call PCP When Office is Open)  Patient/caregiver understands and will follow disposition?:   Reason for Disposition  [1] Caller has NON-URGENT medicine question about med that PCP prescribed AND [2] triager unable to answer question  Answer Assessment - Initial Assessment Questions Pt states she experienced a severe headache after starting prednisone  yesterday. Her BP cuff is packed, asking if she can d/c the prednisone  or take half. Current headache is mild.   Advised pt that this RN will send a message to PCP to request further advisement. Also asking if she can take advil  since she took the prednisone  last night, has not taken today.   Patient will take meclizine .   1. NAME of MEDICINE: What medicine(s) are you calling about?     Prednisone , doxycycline  and meclizine  prescribed yesterday.   2. QUESTION: What is your question? (e.g., double dose of medicine, side effect)     Experienced a severe headache after taking the prednisone .   3. PRESCRIBER: Who prescribed the medicine? Reason: if prescribed by specialist, call should be referred to that group.     Dr. Bring Alvia  4. SYMPTOMS: Do you have any symptoms? If Yes, ask: What symptoms are you having?  How bad are the symptoms (e.g., mild, moderate, severe)     Headache  Protocols used: Medication Question Call-A-AH Copied from CRM #[id number]. Topic: Clinical - Red Word Triage >> May 11, 2024   9:19 AM Berneda FALCON wrote: Red Word that prompted transfer to Nurse Triage: Pt was seen yesterday for elevated blood pressure, dizziness, given prednisone  but it gave her a severe migraine.

## 2024-05-12 ENCOUNTER — Encounter: Payer: Self-pay | Admitting: *Deleted

## 2024-05-12 ENCOUNTER — Other Ambulatory Visit: Payer: Self-pay | Admitting: *Deleted

## 2024-05-14 ENCOUNTER — Encounter: Admitting: Family Medicine

## 2024-05-14 NOTE — Telephone Encounter (Unsigned)
 Copied from CRM [id number]. Topic: Clinical - Medication Question >> May 14, 2024  1:49 PM Diannia H wrote: Reason for CRM: Patient called because she wanted to know could provider send in her an inhaler because the medicine she received on 09/15 she didn't do well on it. She had heart paps. Could you assist? Patients callback number is [phone] or [email or web address].

## 2024-05-24 ENCOUNTER — Ambulatory Visit (INDEPENDENT_AMBULATORY_CARE_PROVIDER_SITE_OTHER): Admitting: Family Medicine

## 2024-05-24 ENCOUNTER — Other Ambulatory Visit: Payer: Self-pay | Admitting: Family Medicine

## 2024-05-24 ENCOUNTER — Telehealth: Payer: Self-pay

## 2024-05-24 ENCOUNTER — Encounter: Payer: Self-pay | Admitting: Family Medicine

## 2024-05-24 VITALS — BP 132/80 | HR 63 | Temp 98.3°F | Resp 18 | Ht 65.5 in | Wt 169.6 lb

## 2024-05-24 DIAGNOSIS — E785 Hyperlipidemia, unspecified: Secondary | ICD-10-CM

## 2024-05-24 DIAGNOSIS — I10 Essential (primary) hypertension: Secondary | ICD-10-CM | POA: Diagnosis not present

## 2024-05-24 DIAGNOSIS — E039 Hypothyroidism, unspecified: Secondary | ICD-10-CM | POA: Diagnosis not present

## 2024-05-24 DIAGNOSIS — F419 Anxiety disorder, unspecified: Secondary | ICD-10-CM

## 2024-05-24 DIAGNOSIS — Z23 Encounter for immunization: Secondary | ICD-10-CM

## 2024-05-24 DIAGNOSIS — Z0000 Encounter for general adult medical examination without abnormal findings: Secondary | ICD-10-CM

## 2024-05-24 DIAGNOSIS — M8000XG Age-related osteoporosis with current pathological fracture, unspecified site, subsequent encounter for fracture with delayed healing: Secondary | ICD-10-CM

## 2024-05-24 DIAGNOSIS — E559 Vitamin D deficiency, unspecified: Secondary | ICD-10-CM

## 2024-05-24 LAB — COMPREHENSIVE METABOLIC PANEL WITH GFR
ALT: 18 U/L (ref 0–35)
AST: 22 U/L (ref 0–37)
Albumin: 4.7 g/dL (ref 3.5–5.2)
Alkaline Phosphatase: 75 U/L (ref 39–117)
BUN: 11 mg/dL (ref 6–23)
CO2: 29 meq/L (ref 19–32)
Calcium: 9.7 mg/dL (ref 8.4–10.5)
Chloride: 104 meq/L (ref 96–112)
Creatinine, Ser: 0.84 mg/dL (ref 0.40–1.20)
GFR: 71.06 mL/min (ref >60.00)
Glucose, Bld: 98 mg/dL (ref 70–99)
Potassium: 4.4 meq/L (ref 3.5–5.1)
Sodium: 140 meq/L (ref 135–145)
Total Bilirubin: 0.7 mg/dL (ref 0.2–1.2)
Total Protein: 7.2 g/dL (ref 6.0–8.3)

## 2024-05-24 LAB — CBC WITH DIFFERENTIAL/PLATELET
Basophils Absolute: 0 K/uL (ref 0.0–0.1)
Basophils Relative: 0.8 % (ref 0.0–3.0)
Eosinophils Absolute: 0.1 K/uL (ref 0.0–0.7)
Eosinophils Relative: 2.4 % (ref 0.0–5.0)
HCT: 44.5 % (ref 36.0–46.0)
Hemoglobin: 14.7 g/dL (ref 12.0–15.0)
Lymphocytes Relative: 37.7 % (ref 12.0–46.0)
Lymphs Abs: 2.2 K/uL (ref 0.7–4.0)
MCHC: 33.1 g/dL (ref 30.0–36.0)
MCV: 90.9 fl (ref 78.0–100.0)
Monocytes Absolute: 0.4 K/uL (ref 0.1–1.0)
Monocytes Relative: 6.2 % (ref 3.0–12.0)
Neutro Abs: 3 K/uL (ref 1.4–7.7)
Neutrophils Relative %: 52.9 % (ref 43.0–77.0)
Platelets: 236 K/uL (ref 150.0–400.0)
RBC: 4.89 Mil/uL (ref 3.87–5.11)
RDW: 13 % (ref 11.5–15.5)
WBC: 5.8 K/uL (ref 4.0–10.5)

## 2024-05-24 LAB — LIPID PANEL
Cholesterol: 167 mg/dL (ref 0–200)
HDL: 59.6 mg/dL (ref >39.00)
LDL Cholesterol: 90 mg/dL (ref 0–99)
NonHDL: 107.7
Total CHOL/HDL Ratio: 3
Triglycerides: 90 mg/dL (ref 0.0–149.0)
VLDL: 18 mg/dL (ref 0.0–40.0)

## 2024-05-24 LAB — TSH: TSH: 1.43 u[IU]/mL (ref 0.35–5.50)

## 2024-05-24 LAB — VITAMIN D 25 HYDROXY (VIT D DEFICIENCY, FRACTURES): VITD: 40.94 ng/mL (ref 30.00–100.00)

## 2024-05-24 MED ORDER — LEVOTHYROXINE SODIUM 88 MCG PO TABS: 88.0000 ug | ORAL_TABLET | Freq: Every day | ORAL | 0 refills | Status: DC

## 2024-05-24 MED ORDER — FLUTICASONE PROPIONATE HFA 110 MCG/ACT IN AERO: 2.0000 | INHALATION_SPRAY | RESPIRATORY_TRACT | 5 refills | Status: DC | PRN

## 2024-05-24 NOTE — Telephone Encounter (Signed)
 Rx sent.

## 2024-05-24 NOTE — Telephone Encounter (Signed)
 Pt was seen today and forgot to ask for a refill of her inhaler

## 2024-05-24 NOTE — Assessment & Plan Note (Signed)
 Well controlled, no changes to meds. Encouraged heart healthy diet such as the DASH diet and exercise as tolerated.

## 2024-05-24 NOTE — Assessment & Plan Note (Signed)
 Ghm utd Check labs  See AVS  Health Maintenance  Topic Date Due   Zoster Vaccines- Shingrix  (2 of 2) 07/13/2019   COVID-19 Vaccine (4 - 2025-26 season) 04/26/2024   Mammogram  04/30/2024   Medicare Annual Wellness (AWV)  01/06/2025   Colonoscopy  08/16/2030   DTaP/Tdap/Td (4 - Td or Tdap) 05/04/2033   Pneumococcal Vaccine: 50+ Years  Completed   Influenza Vaccine  Completed   DEXA SCAN  Completed   Hepatitis C Screening  Completed   HPV VACCINES  Aged Out   Meningococcal B Vaccine  Aged Out

## 2024-05-24 NOTE — Assessment & Plan Note (Signed)
 Stable

## 2024-05-24 NOTE — Assessment & Plan Note (Signed)
 Encourage heart healthy diet such as MIND or DASH diet, increase exercise, avoid trans fats, simple carbohydrates and processed foods, consider a krill or fish or flaxseed oil cap daily.

## 2024-05-24 NOTE — Assessment & Plan Note (Signed)
 Improvement seen on last bmd Prolia  to be started

## 2024-05-24 NOTE — Telephone Encounter (Signed)
 Copied from CRM #[id number]. Topic: Clinical - Medication Question >> May 24, 2024 12:35 PM [place] J wrote: Reason for CRM: Patient forgot to ask Dr. Antonio if she could send in her fluticasone  (FLOVENT  HFA) 110 MCG/ACT inhaler and would like to know if she would be able to send that into the pharmacy today.

## 2024-05-24 NOTE — Progress Notes (Signed)
 Subjective:    Patient ID: Kathryn Boyer, female    DOB: 11/16/1955, 68 y.o.   MRN: [id number]  Chief Complaint  Patient presents with   Annual Exam    Pt states having coffee this morning with creamer     HPI Patient is in today for cpe.  Discussed the use of AI scribe software for clinical note transcription with the patient, who gave verbal consent to proceed.  History of Present Illness [name] is a 68 year old female who presents for a physical exam and discussion of osteoporosis treatment options.  She has previously been on Evenity  for a year. She is considering starting Prolia  but is concerned about potential side effects, including dental issues and significant joint pain experienced in the past, which made ambulation difficult.  She has anxiety since a past accident and describes herself as a 'big baby' now. She has not been exercising for six months due to a fall where she scraped her leg on a box, noting her skin is very thin. She experiences constant joint aches and has not been able to exercise as she used to.  She has a history of vertigo, which she attributes to an implant in her nose that she believes her body is rejecting. The surgeon who placed the implant is deceased, and she is seeking a new surgeon to address this issue. She reports redness in her nose and has been told by an ENT that she does not have a sinus infection.  She is concerned about her blood work, specifically her triglycerides, which she recalls being high. However, her triglycerides were noted to be 70 six months ago, and her LDL was 117, which she acknowledges is above the ideal level of 100.  She recently moved from a large home to a smaller patio home in a retirement community. She has two dogs, a [place] and a [employer], and is adjusting to the new living situation.    Past Medical History:  Diagnosis Date   Abnormal weight gain 02/08/2015   Acute blood loss anemia     Acute cystitis without hematuria 08/10/2021   Acute maxillary sinusitis 02/01/2016   Acute non-recurrent pansinusitis 08/09/2022   Acute pain of right knee 05/25/2018   Acute recurrent ethmoidal sinusitis 11/29/2020   Last Assessment & Plan:    Sinus headaches.   She is having headaches in the face at least twice a week.  Denies colored rhinorrhea.  Some left-sided nasal congestion.  Some postnasal drainage throat awareness.   EXAM shows no intranasal polyps or purulence with a reasonable airway.  Oral cavity oropharynx is unremarkable.   PLAN: Discussed this is most likely migraine and reflux induced irrit   Adjustment disorder with anxiety 02/08/2015   Allergic rhinitis 10/13/2018   Allergic rhinitis due to pollen 02/01/2016   Anal skin tag 07/08/2023   Anxiety    Asthma    Asthma with acute exacerbation 02/01/2016   Bronchitis 08/09/2022   Chest discomfort, appears noncardiac with epigastric discomfort 06/23/2023   Chest pain 04/29/2019   Chronic low back pain 05/25/2018   Critical polytrauma 07/27/2021   Drug induced constipation    Dysuria 12/05/2022   Encounter for counseling 09/18/2016   Essential hypertension 02/01/2016   Fall 07/20/2021   Gastroesophageal reflux disease 10/13/2018   Generalized anxiety disorder 03/22/2017   High risk medication use 12/05/2022   History of hypertension    Humerus fracture 07/21/2021   Hyperlipidemia    Hypertension  Hypothyroidism 03/22/2017   Left trimalleolar fracture, closed, initial encounter 07/21/2021   Lumbar radiculopathy, acute 11/01/2015   Mild intermittent asthma with acute exacerbation 10/13/2018   Mitral regurgitation, mild on last echocardiogram October 2019 06/23/2023   Nondisplaced fracture of fifth metatarsal bone, right foot, initial encounter for closed fracture 07/21/2021   Nondisplaced fracture of lateral malleolus of right fibula, initial encounter for closed fracture 07/21/2021   Osteoporosis 12/20/2016    Osteoporosis, 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   Overweight (BMI 25.0-29.9) 04/23/2022   Plantar fasciitis    Post-menopausal 04/29/2019   Post-nasal drainage 08/28/2022   Postoperative pain    Pressure injury of skin of sacral region 08/10/2021   Preventative health care 10/26/2021   Right hip pain 06/01/2018   Thoracic aorta atherosclerosis 05/25/2018   Thyroid  disease    Tibial plateau fracture, left 07/21/2021   Urinary frequency 04/29/2019   Urine frequency 04/29/2019   Vaginal dryness 02/06/2016    Past Surgical History:  Procedure Laterality Date   ABDOMINAL HYSTERECTOMY     BREAST SURGERY     HARDWARE REMOVAL Left 01/08/2022   Procedure: HARDWARE REMOVAL;  Surgeon: Celena Sharper, MD;  Location: [facility] OR;  Service: Orthopedics;  Laterality: Left;   NOSE SURGERY     ORIF ANKLE FRACTURE Left 07/23/2021   Procedure: OPEN REDUCTION INTERNAL FIXATION (ORIF) ANKLE FRACTURE;  Surgeon: Celena Sharper, MD;  Location: [facility] OR;  Service: Orthopedics;  Laterality: Left;   REVERSE SHOULDER ARTHROPLASTY Left 07/25/2021   Procedure: LEFT REVERSE SHOULDER ARTHROPLASTY;  Surgeon: Cristy Bonner DASEN, MD;  Location: [facility] OR;  Service: Orthopedics;  Laterality: Left;    Family History  Problem Relation Age of Onset   Heart disease Mother    Stroke Mother    Hypertension Mother    Cancer Mother        ovarian   Aneurysm Father    Hypertension Father    Heart disease Father    Kidney disease Father    Heart disease Sister     Social History   Socioeconomic History   Marital status: Married    Spouse name: Not on file   Number of children: Not on file   Years of education: Not on file   Highest education level: Not on file  Occupational History   Occupation: Social research officer, government    Comment: retired  Tobacco Use   Smoking status: Never   Smokeless tobacco: Never  Vaping Use   Vaping status: Never Used  Substance and Sexual Activity   Alcohol use: No   Drug use: No   Sexual activity: Not  Currently    Birth control/protection: None  Other Topics Concern   Not on file  Social History Narrative   Exercise-sometimes   Social Drivers of Health   Financial Resource Strain: Low Risk  (01/07/2024)   Overall Financial Resource Strain (CARDIA)    Difficulty of Paying Living Expenses: Not hard at all  Food Insecurity: No Food Insecurity (01/07/2024)   Hunger Vital Sign    Worried About Running Out of Food in the Last Year: Never true    Ran Out of Food in the Last Year: Never true  Transportation Needs: No Transportation Needs (01/07/2024)   PRAPARE - Administrator, Civil Service (Medical): No    Lack of Transportation (Non-Medical): No  Physical Activity: Sufficiently Active (01/07/2024)   Exercise Vital Sign    Days of Exercise per Week: 5 days    Minutes of Exercise per Session: 60 min  Stress: No Stress Concern Present (01/07/2024)   [employer] of Occupational Health - Occupational Stress Questionnaire    Feeling of Stress : Not at all  Social Connections: Moderately Integrated (01/07/2024)   Social Connection and Isolation Panel    Frequency of Communication with Friends and Family: More than three times a week    Frequency of Social Gatherings with Friends and Family: Once a week    Attends Religious Services: More than 4 times per year    Active Member of [employer] or Organizations: No    Attends Banker Meetings: Never    Marital Status: Married  Catering manager Violence: Not At Risk (01/07/2024)   Humiliation, Afraid, Rape, and Kick questionnaire    Fear of Current or Ex-Partner: No    Emotionally Abused: No    Physically Abused: No    Sexually Abused: No    Outpatient Medications Prior to Visit  Medication Sig Dispense Refill   ALPRAZolam  (XANAX ) 0.5 MG tablet Take 1 tablet (0.5 mg total) by mouth 3 (three) times daily as needed for anxiety. 30 tablet 0   Camphor-Menthol -Methyl Sal (SALONPAS) 3.08-31-08 % PTCH Apply 5 patches topically  daily.     cyclobenzaprine  (FLEXERIL ) 5 MG tablet Take 1 tablet (5 mg total) by mouth every 8 (eight) hours as needed. 30 tablet 2   dicyclomine  (BENTYL ) 20 MG tablet Take 1 tablet (20 mg total) by mouth 2 (two) times daily. 60 tablet 0   diphenhydrAMINE  (BENADRYL ) 25 MG tablet Take 25 mg by mouth every 6 (six) hours as needed for allergies.     estradiol  (ESTRACE ) 0.1 MG/GM vaginal cream Place 1 Applicatorful vaginally 3 (three) times a week. 42.5 g 3   famotidine -calcium  carbonate-magnesium  hydroxide (PEPCID  COMPLETE) 10-800-165 MG chewable tablet Chew 1 tablet by mouth daily as needed (Heartburn).     fluticasone  (FLOVENT  HFA) 110 MCG/ACT inhaler Inhale 2 puffs into the lungs as needed.     ibuprofen  (ADVIL ) 200 MG tablet Take 600 mg by mouth every 6 (six) hours as needed for mild pain or moderate pain.     meclizine  (ANTIVERT ) 25 MG tablet Take 1 tablet (25 mg total) by mouth 3 (three) times daily as needed for dizziness. 30 tablet 0   Multiple Vitamins-Minerals (MULTIVITAMIN WITH MINERALS) tablet Take 1 tablet by mouth daily. V thrive     olmesartan  (BENICAR ) 40 MG tablet Take 1 tablet (40 mg total) by mouth daily. 90 tablet 1   omeprazole (PRILOSEC) 40 MG capsule Take 40 mg by mouth daily.     OVER THE COUNTER MEDICATION Apply 1 application. topically daily as needed (Pain). Body Balm     pravastatin  (PRAVACHOL ) 20 MG tablet TAKE 1 TABLET BY MOUTH EVERY DAY 90 tablet 1   VENTOLIN  HFA 108 (90 Base) MCG/ACT inhaler Inhale 2 puffs into the lungs every 4 (four) hours as needed for wheezing or shortness of breath. 18 g 1   Vitamin D , Ergocalciferol , (DRISDOL ) 1.25 MG (50000 UNIT) CAPS capsule TAKE 1 CAPSULE (50,000 UNITS TOTAL) BY MOUTH EVERY 7 (SEVEN) DAYS 12 capsule 1   cefdinir  (OMNICEF ) 300 MG capsule Take 1 capsule (300 mg total) by mouth 2 (two) times daily. 14 capsule 0   levothyroxine  (SYNTHROID ) 88 MCG tablet Take 1 tablet (88 mcg total) by mouth daily before breakfast. 90 tablet 0    Facility-Administered Medications Prior to Visit  Medication Dose Route Frequency Provider Last Rate Last Admin   denosumab  (PROLIA ) injection 60 mg  60 mg  Subcutaneous Once Lowne Chase, Tell Rozelle R, DO        Allergies  Allergen Reactions   Contrast Media [Iodinated Contrast Media] Hives   Iopamidol  Hives    After returning home pt called to inform technologist of rash to face and neck, denies difficulty breathing or swallowing, pt took 2 benedryl  Will need premeds in future  After returning home pt called to inform technologist of rash to face and neck, denies difficulty breathing or swallowing, pt took 2 benedryl, Will need premeds in future   Liraglutide  Other (See Comments)    Caused indigestion.   Amoxicillin  Other (See Comments)    Headache  Cephalosporin tolerant 07/23/2021  Other Reaction(s): Other (See Comments)   Belviq Xr [Lorcaserin  Hcl Er] Itching    Caused her face to turn red.   Saxenda  [Liraglutide  -Weight Management] Other (See Comments)    Caused indigestion.   Penicillins Rash    Cephalosporin Tolerant 07/23/2021    Prednisone  Palpitations    Review of Systems  Constitutional:  Negative for chills, fever and malaise/fatigue.  HENT:  Negative for congestion and hearing loss.   Eyes:  Negative for blurred vision and discharge.  Respiratory:  Negative for cough, sputum production and shortness of breath.   Cardiovascular:  Negative for chest pain, palpitations and leg swelling.  Gastrointestinal:  Negative for abdominal pain, blood in stool, constipation, diarrhea, heartburn, nausea and vomiting.  Genitourinary:  Negative for dysuria, frequency, hematuria and urgency.  Musculoskeletal:  Negative for back pain, falls and myalgias.  Skin:  Negative for rash.  Neurological:  Negative for dizziness, sensory change, loss of consciousness, weakness and headaches.  Endo/Heme/Allergies:  Negative for environmental allergies. Does not bruise/bleed easily.   Psychiatric/Behavioral:  Negative for depression and suicidal ideas. The patient is not nervous/anxious and does not have insomnia.        Objective:    Physical Exam Vitals and nursing note reviewed.  Constitutional:      General: She is not in acute distress.    Appearance: Normal appearance. She is well-developed.  HENT:     Head: Normocephalic and atraumatic.     Right Ear: Tympanic membrane, ear canal and external ear normal. There is no impacted cerumen.     Left Ear: Tympanic membrane, ear canal and external ear normal. There is no impacted cerumen.     Nose: Nose normal.     Mouth/Throat:     Mouth: Mucous membranes are moist.     Pharynx: Oropharynx is clear. No oropharyngeal exudate or posterior oropharyngeal erythema.  Eyes:     General: No scleral icterus.       Right eye: No discharge.        Left eye: No discharge.     Conjunctiva/sclera: Conjunctivae normal.     Pupils: Pupils are equal, round, and reactive to light.  Neck:     Thyroid : No thyromegaly or thyroid  tenderness.     Vascular: No JVD.  Cardiovascular:     Rate and Rhythm: Normal rate and regular rhythm.     Heart sounds: Normal heart sounds. No murmur heard. Pulmonary:     Effort: Pulmonary effort is normal. No respiratory distress.     Breath sounds: Normal breath sounds.  Abdominal:     General: Bowel sounds are normal. There is no distension.     Palpations: Abdomen is soft. There is no mass.     Tenderness: There is no abdominal tenderness. There is no guarding or rebound.  Genitourinary:    Vagina: Normal.  Musculoskeletal:        General: Normal range of motion.     Cervical back: Normal range of motion and neck supple.     Right lower leg: No edema.     Left lower leg: No edema.  Lymphadenopathy:     Cervical: No cervical adenopathy.  Skin:    General: Skin is warm and dry.     Findings: No erythema or rash.  Neurological:     Mental Status: She is alert and oriented to person,  place, and time.     Cranial Nerves: No cranial nerve deficit.     Deep Tendon Reflexes: Reflexes are normal and symmetric.  Psychiatric:        Mood and Affect: Mood normal.        Behavior: Behavior normal.        Thought Content: Thought content normal.        Judgment: Judgment normal.     BP 132/80 (BP Location: Right Arm, Patient Position: Sitting, Cuff Size: Normal)   Pulse 63   Temp 98.3 F (36.8 C) (Oral)   Resp 18   Ht 5' 5.5 (1.664 m)   Wt 169 lb 9.6 oz (76.9 kg)   SpO2 97%   BMI 27.79 kg/m  Wt Readings from Last 3 Encounters:  05/24/24 169 lb 9.6 oz (76.9 kg)  05/10/24 169 lb (76.7 kg)  02/02/24 165 lb 5.5 oz (75 kg)    Diabetic Foot Exam - Simple   No data filed    Lab Results  Component Value Date   WBC 7.1 05/10/2024   HGB 15.3 05/10/2024   HCT 45.8 05/10/2024   PLT 262 05/10/2024   GLUCOSE 86 05/10/2024   CHOL 195 11/03/2023   TRIG 70.0 11/03/2023   HDL 64.00 11/03/2023   LDLCALC 117 (H) 11/03/2023   ALT 20 05/10/2024   AST 26 05/10/2024   NA 141 05/10/2024   K 4.6 05/10/2024   CL 104 05/10/2024   CREATININE 0.85 05/10/2024   BUN 10 05/10/2024   CO2 21 05/10/2024   TSH 1.58 05/05/2023   HGBA1C 5.9 04/25/2022    Lab Results  Component Value Date   TSH 1.58 05/05/2023   Lab Results  Component Value Date   WBC 7.1 05/10/2024   HGB 15.3 05/10/2024   HCT 45.8 05/10/2024   MCV 93 05/10/2024   PLT 262 05/10/2024   Lab Results  Component Value Date   NA 141 05/10/2024   K 4.6 05/10/2024   CO2 21 05/10/2024   GLUCOSE 86 05/10/2024   BUN 10 05/10/2024   CREATININE 0.85 05/10/2024   BILITOT 0.5 05/10/2024   ALKPHOS 93 05/10/2024   AST 26 05/10/2024   ALT 20 05/10/2024   PROT 7.5 05/10/2024   ALBUMIN  4.7 05/10/2024   CALCIUM  9.9 05/10/2024   ANIONGAP 13 02/02/2024   EGFR 74 05/10/2024   GFR 62.33 11/03/2023   Lab Results  Component Value Date   CHOL 195 11/03/2023   Lab Results  Component Value Date   HDL 64.00 11/03/2023    Lab Results  Component Value Date   LDLCALC 117 (H) 11/03/2023   Lab Results  Component Value Date   TRIG 70.0 11/03/2023   Lab Results  Component Value Date   CHOLHDL 3 11/03/2023   Lab Results  Component Value Date   HGBA1C 5.9 04/25/2022       Assessment & Plan:  Preventative health care  Assessment & Plan: Ghm utd Check labs  See AVS  Health Maintenance  Topic Date Due   Zoster Vaccines- Shingrix  (2 of 2) 07/13/2019   COVID-19 Vaccine (4 - 2025-26 season) 04/26/2024   Mammogram  04/30/2024   Medicare Annual Wellness (AWV)  01/06/2025   Colonoscopy  08/16/2030   DTaP/Tdap/Td (4 - Td or Tdap) 05/04/2033   Pneumococcal Vaccine: 50+ Years  Completed   Influenza Vaccine  Completed   DEXA SCAN  Completed   Hepatitis C Screening  Completed   HPV VACCINES  Aged Out   Meningococcal B Vaccine  Aged Out     Orders: -     Levothyroxine  Sodium; Take 1 tablet (88 mcg total) by mouth daily before breakfast.  Dispense: 90 tablet; Refill: 0 -     CBC with Differential/Platelet -     Comprehensive metabolic panel with GFR -     Lipid panel -     TSH  Hypothyroidism, unspecified type -     Levothyroxine  Sodium; Take 1 tablet (88 mcg total) by mouth daily before breakfast.  Dispense: 90 tablet; Refill: 0 -     TSH  Primary hypertension Assessment & Plan: Well controlled, no changes to meds. Encouraged heart healthy diet such as the DASH diet and exercise as tolerated.     Vitamin D  deficiency -     VITAMIN D  25 Hydroxy (Vit-D Deficiency, Fractures)  Hyperlipidemia, unspecified hyperlipidemia type Assessment & Plan: Encourage heart healthy diet such as MIND or DASH diet, increase exercise, avoid trans fats, simple carbohydrates and processed foods, consider a krill or fish or flaxseed oil cap daily.    Orders: -     Lipid panel  Age-related osteoporosis with current pathological fracture with delayed healing, subsequent encounter Assessment & Plan: Improvement  seen on last bmd Prolia  to be started    Essential hypertension Assessment & Plan: Well controlled, no changes to meds. Encouraged heart healthy diet such as the DASH diet and exercise as tolerated.    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 An ENT evaluation ruled out sinus infection. There are concerns about implant rejection, necessitating further evaluation by a Lawyer. She has a history of facial plastic surgery with the now-deceased Dr. Hoy. Refer her to a qualified Investment banker, operational experienced with nasal implants.  Osteoporosis with current pathological fracture   Osteoporosis was previously treated with Evenity  for one year, resulting in improved bone density. Transition to Prolia  was discussed, considering previous severe musculoskeletal pain as a potential side effect. Alternatives like Fosamax  were considered but noted as ineffective in her family history. Proceed with Prolia  for continued osteoporosis management. Order Prolia  and schedule its administration, ensuring it is given separately from the flu shot.  Adult Wellness Visit   During the routine adult wellness visit, she discussed a recent move and related stress, with no acute concerns. Administer the flu shot today. Emphasize the importance of scheduling a mammogram and encourage rescheduling at [facility]. Discuss the shingles vaccine series of two and the RSV vaccine, both to be obtained at the pharmacy.    Safiyya Stokes R Lowne Chase, DO

## 2024-05-26 ENCOUNTER — Ambulatory Visit: Payer: Self-pay | Admitting: Family Medicine

## 2024-05-28 ENCOUNTER — Other Ambulatory Visit: Payer: Self-pay | Admitting: Family Medicine

## 2024-05-28 DIAGNOSIS — E559 Vitamin D deficiency, unspecified: Secondary | ICD-10-CM

## 2024-06-01 ENCOUNTER — Ambulatory Visit

## 2024-06-01 DIAGNOSIS — M810 Age-related osteoporosis without current pathological fracture: Secondary | ICD-10-CM | POA: Diagnosis not present

## 2024-06-01 MED ORDER — DENOSUMAB 60 MG/ML ~~LOC~~ SOSY: 60.0000 mg | PREFILLED_SYRINGE | SUBCUTANEOUS | Status: AC

## 2024-06-01 NOTE — Progress Notes (Signed)
 Pt was in office today for her Prolia  injection, injection was given subcutaneous in R arm and pt tolerated well. Pt was advised she will be called to schedule next dose, pt is aware and expressed understanding.

## 2024-06-04 ENCOUNTER — Telehealth: Payer: Self-pay

## 2024-06-04 NOTE — Telephone Encounter (Signed)
 Patient is still having vertigo issues, and is almost our of the medication for the following:   Meclizine  HCl 25 mg Oral 3 times daily   Patient stated that she has an appointment 05/17/2024 with Dr. Georgina and they wouldn't filled the medication for her. She has to be seen first.   Please advise? She wanted a message in MyChart.

## 2024-06-04 NOTE — Telephone Encounter (Signed)
 Patient is aware.

## 2024-06-08 ENCOUNTER — Ambulatory Visit

## 2024-08-18 ENCOUNTER — Other Ambulatory Visit: Payer: Self-pay | Admitting: Family Medicine

## 2024-08-18 DIAGNOSIS — E039 Hypothyroidism, unspecified: Secondary | ICD-10-CM

## 2024-08-18 DIAGNOSIS — Z0000 Encounter for general adult medical examination without abnormal findings: Secondary | ICD-10-CM

## 2024-09-29 LAB — HM MAMMOGRAPHY

## 2024-09-30 ENCOUNTER — Encounter: Payer: Self-pay | Admitting: Family Medicine

## 2024-10-01 ENCOUNTER — Encounter: Payer: Self-pay | Admitting: Internal Medicine

## 2024-10-01 ENCOUNTER — Ambulatory Visit: Admitting: Internal Medicine

## 2024-10-01 VITALS — BP 122/76 | HR 65 | Temp 97.7°F | Resp 18 | Ht 66.5 in | Wt 179.0 lb

## 2024-10-01 DIAGNOSIS — J4520 Mild intermittent asthma, uncomplicated: Secondary | ICD-10-CM

## 2024-10-01 DIAGNOSIS — J310 Chronic rhinitis: Secondary | ICD-10-CM

## 2024-10-01 DIAGNOSIS — R0609 Other forms of dyspnea: Secondary | ICD-10-CM

## 2024-10-01 DIAGNOSIS — H6592 Unspecified nonsuppurative otitis media, left ear: Secondary | ICD-10-CM

## 2024-10-01 NOTE — Progress Notes (Signed)
 "  NEW PATIENT Date of Service/Encounter:   10/01/2024 Referring provider: Antonio Meth, Jamee SAUNDERS, DO Primary care provider: Antonio Meth, Jamee SAUNDERS, DO  Subjective:  Kathryn Boyer is a 69 y.o. female with a PMHx of  presenting today for evaluation of chronic rhinitis.  History obtained from: chart review and patient.   Discussed the use of AI scribe software for clinical note transcription with the patient, who gave verbal consent to proceed.  History of Present Illness Kathryn Boyer is a 69 year old female who presents with dizziness and nasal congestion.  Dizziness and dyspnea - Dizziness present for the past 2-3 months - Episodes occur during inhalation - Not position-dependent; occurs while sitting, eating, or walking - Sensation of not getting enough air, sometimes gasping for breath - Inhaler used twice with some relief - No sensation of throat closing or neck tightness - no associated wheezing - strong smells do bother her, unclear if related to these symptoms - She feels this is related to not getting enough air through her nose, but unclear if she has tried mouth breathing when this occurs  Asthma - Asthma diagnosed in adulthood - Uses inhaler as needed, which is infrequent - tried rescue inhaler twice during dizziness episodes and unclear if helpful -Current symptoms described as dizziness and feeling the need to gasp for air are not similar to her previous asthma symptoms  Nasal congestion and epistaxis - Chronic nasal congestion, primarily in the left nostril - Daily use of Vicks and nasal gel - Frequent epistaxis - Nasal implant in place (duration unspecified) - History of nasal cauterization procedures for bleeding - Uses children's Flonase  and Zyrtec due to excessive dryness with adult doses  Headache - Occasional severe headaches - Manages headaches with Advil   Olfactory sensitivity - Sensitive to strong smells  Saline rinse use - History of using saline  rinses - Sometimes feels saline rinses contribute to dizziness  Allergy evaluation - Allergy testing 5-6 months ago negative for dog allergies - dog in home that she worries may contribute to symptoms  Weight gain and activity - Recent weight gain attributed to decreased activity after moving     Chart Review:  Previous patient of our clinic.  Last visit 2021.  Seen for allergic rhinitis taking cetirizine and saline gel.  Prior testing positive to mold, dust mites and pollens.  Also being followed for mild intermittent asthma on montelukast  and albuterol  as needed as well as GERD on famotidine  20 mg twice a day for reflux.  Serum allergy testing 06/16/2024, positive to cockroach, borderline to dust mites and mountain juniper tree  Past Medical History: Past Medical History:  Diagnosis Date   Abnormal weight gain 02/08/2015   Acute blood loss anemia    Acute cystitis without hematuria 08/10/2021   Acute maxillary sinusitis 02/01/2016   Acute non-recurrent pansinusitis 08/09/2022   Acute pain of right knee 05/25/2018   Acute recurrent ethmoidal sinusitis 11/29/2020   Last Assessment & Plan:    Sinus headaches.   She is having headaches in the face at least twice a week.  Denies colored rhinorrhea.  Some left-sided nasal congestion.  Some postnasal drainage throat awareness.   EXAM shows no intranasal polyps or purulence with a reasonable airway.  Oral cavity oropharynx is unremarkable.   PLAN: Discussed this is most likely migraine and reflux induced irrit   Adjustment disorder with anxiety 02/08/2015   Allergic rhinitis 10/13/2018   Allergic rhinitis due to pollen 02/01/2016   Anal  skin tag 07/08/2023   Anxiety    Asthma    Asthma with acute exacerbation 02/01/2016   Bronchitis 08/09/2022   Chest discomfort, appears noncardiac with epigastric discomfort 06/23/2023   Chest pain 04/29/2019   Chronic low back pain 05/25/2018   Critical polytrauma 07/27/2021   Drug induced  constipation    Dysuria 12/05/2022   Encounter for counseling 09/18/2016   Essential hypertension 02/01/2016   Fall 07/20/2021   Gastroesophageal reflux disease 10/13/2018   Generalized anxiety disorder 03/22/2017   High risk medication use 12/05/2022   History of hypertension    Humerus fracture 07/21/2021   Hyperlipidemia    Hypertension    Hypothyroidism 03/22/2017   Left trimalleolar fracture, closed, initial encounter 07/21/2021   Lumbar radiculopathy, acute 11/01/2015   Mild intermittent asthma with acute exacerbation 10/13/2018   Mitral regurgitation, mild on last echocardiogram October 2019 06/23/2023   Nondisplaced fracture of fifth metatarsal bone, right foot, initial encounter for closed fracture 07/21/2021   Nondisplaced fracture of lateral malleolus of right fibula, initial encounter for closed fracture 07/21/2021   Osteoporosis 12/20/2016   Osteoporosis, 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   Overweight (BMI 25.0-29.9) 04/23/2022   Plantar fasciitis    Post-menopausal 04/29/2019   Post-nasal drainage 08/28/2022   Postoperative pain    Pressure injury of skin of sacral region 08/10/2021   Preventative health care 10/26/2021   Right hip pain 06/01/2018   Thoracic aorta atherosclerosis 05/25/2018   Thyroid  disease    Tibial plateau fracture, left 07/21/2021   Urinary frequency 04/29/2019   Urine frequency 04/29/2019   Vaginal dryness 02/06/2016   Medication List:  Current Outpatient Medications  Medication Sig Dispense Refill   ALPRAZolam  (XANAX ) 0.5 MG tablet Take 1 tablet (0.5 mg total) by mouth 3 (three) times daily as needed for anxiety. 30 tablet 0   Camphor-Menthol -Methyl Sal (SALONPAS) 3.08-31-08 % PTCH Apply 5 patches topically daily.     diphenhydrAMINE  (BENADRYL ) 25 MG tablet Take 25 mg by mouth every 6 (six) hours as needed for allergies.     ibuprofen  (ADVIL ) 200 MG tablet Take 600 mg by mouth every 6 (six) hours as needed for mild pain or moderate pain.      levothyroxine  (SYNTHROID ) 88 MCG tablet TAKE 1 TABLET BY MOUTH DAILY BEFORE BREAKFAST. 90 tablet 0   meclizine  (ANTIVERT ) 25 MG tablet Take 1 tablet (25 mg total) by mouth 3 (three) times daily as needed for dizziness. 30 tablet 0   Multiple Vitamins-Minerals (MULTIVITAMIN WITH MINERALS) tablet Take 1 tablet by mouth daily. V thrive     omeprazole (PRILOSEC) 40 MG capsule Take 40 mg by mouth daily.     pravastatin  (PRAVACHOL ) 20 MG tablet TAKE 1 TABLET BY MOUTH EVERY DAY 90 tablet 1   VENTOLIN  HFA 108 (90 Base) MCG/ACT inhaler Inhale 2 puffs into the lungs every 4 (four) hours as needed for wheezing or shortness of breath. 18 g 1   Vitamin D , Ergocalciferol , (DRISDOL ) 1.25 MG (50000 UNIT) CAPS capsule TAKE 1 CAPSULE (50,000 UNITS TOTAL) BY MOUTH EVERY 7 (SEVEN) DAYS 12 capsule 1   Current Facility-Administered Medications  Medication Dose Route Frequency Provider Last Rate Last Admin   [START ON 11/28/2024] denosumab  (PROLIA ) injection 60 mg  60 mg Subcutaneous Q6 months Antonio Meth, Yvonne R, DO       Known Allergies:  Allergies[1] Past Surgical History: Past Surgical History:  Procedure Laterality Date   ABDOMINAL HYSTERECTOMY     BREAST SURGERY     HARDWARE REMOVAL Left  01/08/2022   Procedure: HARDWARE REMOVAL;  Surgeon: Celena Sharper, MD;  Location: [facility] OR;  Service: Orthopedics;  Laterality: Left;   NOSE SURGERY     ORIF ANKLE FRACTURE Left 07/23/2021   Procedure: OPEN REDUCTION INTERNAL FIXATION (ORIF) ANKLE FRACTURE;  Surgeon: Celena Sharper, MD;  Location: [facility] OR;  Service: Orthopedics;  Laterality: Left;   REVERSE SHOULDER ARTHROPLASTY Left 07/25/2021   Procedure: LEFT REVERSE SHOULDER ARTHROPLASTY;  Surgeon: Cristy Bonner DASEN, MD;  Location: [facility] OR;  Service: Orthopedics;  Laterality: Left;   Family History: Family History  Problem Relation Age of Onset   Heart disease Mother    Stroke Mother    Hypertension Mother    Cancer Mother        ovarian   Aneurysm Father    Hypertension  Father    Heart disease Father    Kidney disease Father    Heart disease Sister    Asthma Brother    Allergic rhinitis Brother    Social History: Mahogani lives in a patio home since 2017 with no water damage, area rugs, gas heating, central AC, 2 dogs, no roaches, not using dust mite covers on the bed of the pillows, no smoke exposure.  She is a retired network engineer.  [place]  Not exposed to fumes chemicals or dust.   ROS:  All other systems negative except as noted per HPI.  Objective:  Blood pressure 122/76, pulse 65, temperature 97.7 F (36.5 C), temperature source Temporal, resp. rate 18, height 5' 6.5 (1.689 m), weight 179 lb (81.2 kg), SpO2 98%. Body mass index is 28.46 kg/m. Physical Exam:  General Appearance:  Alert, cooperative, no distress, appears stated age  Head:  Normocephalic, without obvious abnormality, atraumatic  Eyes:  Conjunctiva clear, EOM's intact  Ears Right TM normal and left TM with fluid levels noted and EACs normal bilaterally  Nose: Nares normal, normal mucosa and no visible anterior polyps  Throat: Lips, tongue normal; teeth and gums normal, normal posterior oropharynx  Neck: Supple, symmetrical  Lungs:   clear to auscultation bilaterally, Respirations unlabored, no coughing  Heart:  regular rate and rhythm and no murmur, Appears well perfused  Extremities: No edema  Skin: Skin color, texture, turgor normal and no rashes or lesions on visualized portions of skin  Neurologic: No gross deficits   Diagnostics: Spirometry:  Tracings reviewed. Her effort: Good reproducible efforts. FVC: 2.81L  FEV1: 2.22L, 92% predicted  FEV1/FVC ratio: 0.79  Interpretation: Spirometry consistent with normal pattern.  Please see scanned spirometry results for details.  Labs:  Lab Orders  No laboratory test(s) ordered today     Assessment and Plan  Assessment and Plan Assessment & Plan Dizziness History of mild  intermittent asthma Intermittent dizziness and dyspnea, not typical of asthma. Symptoms may be related to vocal cord dysfunction or other causes. Inhaler provides some relief. She does have a previous history of asthma which has been mild and intermittent.  Current symptoms unlike her prior symptoms of asthma.  Considered orthostatic hypotension but she denies positional changes related to symptoms. -Spirometry today looks normal. - Use albuterol  inhaler during episodes of dizziness and dyspnea and document if provides relief - Provided exercises for vocal cord dysfunction to assess if symptoms improve after using these during acute episodes. -Discussed the symptoms also with your PCP to consider other possible causes  Allergic rhinitis with recurrent epistaxis Recurrent epistaxis primarily from the left nostril, possibly due to nasal implant irritation. Previous cauterization  performed. Concerns about nasal implant and potential need for ENT evaluation. Allergy testing negative for dog allergies, but skin testing recommended to confirm. Allergy shots discussed as a potential treatment option if allergies are confirmed.  Unable to tolerate antihistamines due to dryness and recurrent epistaxis. - Referred to ENT for evaluation of nasal implant and recurrent epistaxis.  Dr. Darral in [place]. - Scheduled allergy skin testing to confirm allergies. - Use saline rinses for nasal congestion. - Avoid antihistamines and nasal sprays with antihistamines three days prior to allergy testing.  Eustachian tube dysfunction with middle ear effusion Fluid behind the ear indicating mucus retention. Saline rinses recommended to alleviate symptoms. Flonase  not recommended due to risk of exacerbating epistaxis. - Use saline rinses as above - Avoid Flonase  due to risk of exacerbating epistaxis.  Follow up : Next Friday, February 13 at 11:30 AM.  (1-85).  Stop antihistamines 3 days prior to visit It was a  pleasure meeting you in clinic today! Thank you for allowing me to participate in your care.  Rocky Endow, MD [facility]      This note in its entirety was forwarded to the Provider who requested this consultation.  Other: Sample of NeilMed sinus rinse provide  Thank you for your kind referral. I appreciate the opportunity to take part in Kristena's care. Please do not hesitate to contact me with questions.  Sincerely,  Rocky Endow, MD [facility]          [1]  Allergies Allergen Reactions   Contrast Media [Iodinated Contrast Media] Hives   Iopamidol  Hives    After returning home pt called to inform technologist of rash to face and neck, denies difficulty breathing or swallowing, pt took 2 benedryl  Will need premeds in future  After returning home pt called to inform technologist of rash to face and neck, denies difficulty breathing or swallowing, pt took 2 benedryl, Will need premeds in future   Liraglutide  Other (See Comments)    Caused indigestion.   Amoxicillin  Other (See Comments)    Headache  Cephalosporin tolerant 07/23/2021  Other Reaction(s): Other (See Comments)   Belviq Xr [Lorcaserin  Hcl Er] Itching    Caused her face to turn red.   Saxenda  [Liraglutide  -Weight Management] Other (See Comments)    Caused indigestion.   Penicillins Rash    Cephalosporin Tolerant 07/23/2021    Prednisone  Palpitations   "

## 2024-10-01 NOTE — Patient Instructions (Addendum)
 Dizziness Intermittent dizziness and dyspnea, not typical of asthma. Symptoms may be related to vocal cord dysfunction or other causes. Inhaler provides some relief. She does have a previous history of asthma which has been mild and intermittent.  Current symptoms unlike her prior symptoms of asthma.  Considered orthostatic hypotension but she denies positional changes related to symptoms. -Spirometry today looks normal. - Use albuterol  inhaler during episodes of dizziness and dyspnea and document if provides relief - Provided exercises for vocal cord dysfunction to assess if symptoms improve after using these during acute episodes. -Discussed the symptoms also with your PCP to consider other possible causes  Allergic rhinitis with recurrent epistaxis Recurrent epistaxis primarily from the left nostril, possibly due to nasal implant irritation. Previous cauterization performed. Concerns about nasal implant and potential need for ENT evaluation. Allergy testing negative for dog allergies, but skin testing recommended to confirm. Allergy shots discussed as a potential treatment option if allergies are confirmed.  Unable to tolerate antihistamines due to dryness and recurrent epistaxis. - Referred to ENT for evaluation of nasal implant and recurrent epistaxis.  Dr. Darral in [place]. - Scheduled allergy skin testing to confirm allergies. - Use saline rinses for nasal congestion. - Avoid antihistamines and nasal sprays with antihistamines three days prior to allergy testing.  Eustachian tube dysfunction with middle ear effusion Fluid behind the ear indicating mucus retention. Saline rinses recommended to alleviate symptoms. Flonase  not recommended due to risk of exacerbating epistaxis. - Use saline rinses as above - Avoid Flonase  due to risk of exacerbating epistaxis.  Follow up : Next Friday, February 13 at 11:30 AM.  (1-85).  Stop antihistamines 3 days prior to visit It was a pleasure  meeting you in clinic today! Thank you for allowing me to participate in your care.  Rocky Endow, MD [facility]  Vocal Cord Dysfunction (VCD) /Inspiratory Laryngeal Obstruction (ILO)/ Exercise- Induced Laryngeal Obstruction Exercises (EILO)  Practice these techniques several times a day, so that when you need them, they will be automatic, and will work right away (or very fast) to open up the spasming (closed) vocal cords.  PREPARATION: ? Loosen clothing at waist, so nothing is tight or snug. Open top buttons of pants or skirt, unzip pant or skirt zippers, pull shirt out over pants or skirt. This prevents pressure on the stomach, which can cause stomach reflux (backup of corrosive liquid) into the esophagus. Gastric reflux is a frequent cause of VCD attacks. ? Sit in a comfortable chair. When you need to use these techniques, you may be standing, lying, or sitting-BUT first try to learn them while sitting because they are easier to learn in this position. ? Keep water handy. Take sips, so your mouth and throat will not dry out. Swallow carefully, to avoid choking. ? Put your right (or left) thumb on your belly button, with the rest of your fingers below the thumb, so you are GENTLY touching your belly (lower abdomen). ? Doing this will focus your attention on your lower abdominal muscles. ? Relax your chest. Relax your shoulders. Relax your neck. Relax your throat. This helps you to try to use ONLY your belly (lower abdomen) muscles. Consciously try NOT to use chest muscles, etc. Chest muscle use seems to irritate vocal cords while belly muscles tend to relax them.  BREATHE OUT (EXHALE) FIRST WITH slightly PURSED LIPS: ? Since most people cannot inhale, (breathe in), during VCD attacks, please start by breathing out (exhaling) in the special  way described below, and this will usually open up the vocal cords, immediately. ? Start, by breathing out (exhaling) with  lips pursed as if you are trying to gently blow out a candle. ? This is similar to whistling, but your lips will not be as puckered as when whistling and your lips will not be pushed forward, like when whistling. If you look in a mirror, you would see a mostly horizontal line of space between the lips, while you exhale the ffffffffffffffffffff. ? This may be silent, or may sound like a gentle wind or breeze, like fffffffffffffff and your lips should be symmetrical, around your teeth. You lower lip will not be touching your upper teeth, like when someone says the name FFFFFFFRank. This is one continuous flow of exhaled air, either silent, or making a slightly windy sound. ? Feel your hand on your belly (lower abdomen) come IN, a little bit toward your back, as you are working only your lower belly muscles. ? This abdominal exhaled fffffffffffffffff alone often stops VCD attacks very quickly.  ? Some prefer to try a variation of exhaling ffffffffffff. Try exhaling quickly, ffff, fffff, ffff--all part of one exhalation. Do not inhale in between quick fffs. This helps some people more quickly open up the spasming vocal cords. This is like blowing out a slightly stubborn candle, exhaling against a tiny bit of resistance (like trying to blow out a trick birthday candle). ? If any of this helps, try to slow down the exhalations, and gently exhale ffffffffffffffffff. ? Some people prefer to exhale gently ssssssssssssssss or shhhhhhhhhhhhh rather than fffffffffffffff, etc. Choose what works best in your case, or what is most comfortable for you. ? You may need to repeat exhaling ffffffffffffff (or ffff, ffff, ffff), etc, several times to relax the spasming vocal cords. Repeating this often stops a VCD attack so that you can inhale (breathe in) again.

## 2024-10-08 ENCOUNTER — Ambulatory Visit: Admitting: Internal Medicine

## 2025-01-12 ENCOUNTER — Ambulatory Visit
